# Patient Record
Sex: Female | Born: 1940 | Race: Black or African American | Hispanic: No | Smoking: Former smoker
Health system: Southern US, Community
[De-identification: ages and names within clinical notes are randomized; demographics above are authoritative.]

## PROBLEM LIST (undated history)

## (undated) DIAGNOSIS — Z87898 Personal history of other specified conditions: Secondary | ICD-10-CM

## (undated) DIAGNOSIS — J42 Unspecified chronic bronchitis: Secondary | ICD-10-CM

## (undated) DIAGNOSIS — Z92241 Personal history of systemic steroid therapy: Secondary | ICD-10-CM

## (undated) DIAGNOSIS — E079 Disorder of thyroid, unspecified: Secondary | ICD-10-CM

## (undated) DIAGNOSIS — Z9889 Other specified postprocedural states: Secondary | ICD-10-CM

## (undated) DIAGNOSIS — H269 Unspecified cataract: Secondary | ICD-10-CM

## (undated) DIAGNOSIS — Z9989 Dependence on other enabling machines and devices: Secondary | ICD-10-CM

## (undated) DIAGNOSIS — R519 Headache, unspecified: Secondary | ICD-10-CM

## (undated) DIAGNOSIS — R112 Nausea with vomiting, unspecified: Secondary | ICD-10-CM

## (undated) DIAGNOSIS — C801 Malignant (primary) neoplasm, unspecified: Secondary | ICD-10-CM

## (undated) DIAGNOSIS — R51 Headache: Secondary | ICD-10-CM

## (undated) DIAGNOSIS — M109 Gout, unspecified: Secondary | ICD-10-CM

## (undated) DIAGNOSIS — B019 Varicella without complication: Secondary | ICD-10-CM

## (undated) DIAGNOSIS — M48061 Spinal stenosis, lumbar region without neurogenic claudication: Secondary | ICD-10-CM

## (undated) DIAGNOSIS — F329 Major depressive disorder, single episode, unspecified: Secondary | ICD-10-CM

## (undated) DIAGNOSIS — R32 Unspecified urinary incontinence: Secondary | ICD-10-CM

## (undated) DIAGNOSIS — K635 Polyp of colon: Secondary | ICD-10-CM

## (undated) DIAGNOSIS — D649 Anemia, unspecified: Secondary | ICD-10-CM

## (undated) DIAGNOSIS — M1990 Unspecified osteoarthritis, unspecified site: Secondary | ICD-10-CM

## (undated) DIAGNOSIS — K5792 Diverticulitis of intestine, part unspecified, without perforation or abscess without bleeding: Secondary | ICD-10-CM

## (undated) DIAGNOSIS — E785 Hyperlipidemia, unspecified: Secondary | ICD-10-CM

## (undated) DIAGNOSIS — E119 Type 2 diabetes mellitus without complications: Secondary | ICD-10-CM

## (undated) DIAGNOSIS — K219 Gastro-esophageal reflux disease without esophagitis: Secondary | ICD-10-CM

## (undated) DIAGNOSIS — I10 Essential (primary) hypertension: Secondary | ICD-10-CM

## (undated) HISTORY — PX: CATARACT EXTRACTION, BILATERAL: SHX1313

## (undated) HISTORY — DX: Polyp of colon: K63.5

## (undated) HISTORY — DX: Unspecified urinary incontinence: R32

## (undated) HISTORY — DX: Unspecified osteoarthritis, unspecified site: M19.90

## (undated) HISTORY — DX: Hyperlipidemia, unspecified: E78.5

## (undated) HISTORY — DX: Dependence on other enabling machines and devices: Z99.89

## (undated) HISTORY — DX: Disorder of thyroid, unspecified: E07.9

## (undated) HISTORY — PX: COLONOSCOPY: SHX174

## (undated) HISTORY — DX: Major depressive disorder, single episode, unspecified: F32.9

## (undated) HISTORY — DX: Unspecified chronic bronchitis: J42

## (undated) HISTORY — PX: POLYPECTOMY: SHX149

## (undated) HISTORY — DX: Varicella without complication: B01.9

## (undated) HISTORY — DX: Other specified postprocedural states: Z98.890

## (undated) HISTORY — DX: Essential (primary) hypertension: I10

## (undated) HISTORY — DX: Nausea with vomiting, unspecified: R11.2

## (undated) HISTORY — DX: Headache, unspecified: R51.9

## (undated) HISTORY — DX: Gout, unspecified: M10.9

## (undated) HISTORY — DX: Spinal stenosis, lumbar region without neurogenic claudication: M48.061

## (undated) HISTORY — DX: Malignant (primary) neoplasm, unspecified: C80.1

## (undated) HISTORY — DX: Type 2 diabetes mellitus without complications: E11.9

## (undated) HISTORY — DX: Personal history of systemic steroid therapy: Z92.241

## (undated) HISTORY — DX: Headache: R51

## (undated) HISTORY — DX: Unspecified cataract: H26.9

## (undated) HISTORY — DX: Personal history of other specified conditions: Z87.898

## (undated) HISTORY — DX: Anemia, unspecified: D64.9

## (undated) HISTORY — DX: Gastro-esophageal reflux disease without esophagitis: K21.9

## (undated) HISTORY — DX: Diverticulitis of intestine, part unspecified, without perforation or abscess without bleeding: K57.92

---

## 1967-12-26 DIAGNOSIS — C801 Malignant (primary) neoplasm, unspecified: Secondary | ICD-10-CM

## 1967-12-26 HISTORY — DX: Malignant (primary) neoplasm, unspecified: C80.1

## 1968-12-25 HISTORY — PX: CERVICAL CONE BIOPSY: SUR198

## 1968-12-25 HISTORY — PX: ABDOMINAL HYSTERECTOMY: SHX81

## 1993-12-25 HISTORY — PX: BACK SURGERY: SHX140

## 2008-12-25 HISTORY — PX: WRIST SURGERY: SHX841

## 2013-01-16 ENCOUNTER — Ambulatory Visit: Payer: Self-pay | Admitting: Family Medicine

## 2013-01-25 HISTORY — PX: BACK SURGERY: SHX140

## 2013-01-30 DIAGNOSIS — Z531 Procedure and treatment not carried out because of patient's decision for reasons of belief and group pressure: Secondary | ICD-10-CM | POA: Insufficient documentation

## 2013-01-30 DIAGNOSIS — M5116 Intervertebral disc disorders with radiculopathy, lumbar region: Secondary | ICD-10-CM | POA: Insufficient documentation

## 2013-01-30 DIAGNOSIS — I878 Other specified disorders of veins: Secondary | ICD-10-CM | POA: Insufficient documentation

## 2013-02-24 ENCOUNTER — Ambulatory Visit: Payer: Self-pay | Admitting: Family Medicine

## 2013-03-10 ENCOUNTER — Ambulatory Visit: Payer: Self-pay | Admitting: Family Medicine

## 2013-05-12 ENCOUNTER — Ambulatory Visit: Payer: Self-pay | Admitting: Family Medicine

## 2013-06-11 ENCOUNTER — Ambulatory Visit: Payer: Self-pay | Admitting: Family Medicine

## 2013-06-30 ENCOUNTER — Encounter: Payer: Self-pay | Admitting: Family Medicine

## 2013-06-30 ENCOUNTER — Ambulatory Visit (INDEPENDENT_AMBULATORY_CARE_PROVIDER_SITE_OTHER): Payer: Medicare Other | Admitting: Family Medicine

## 2013-06-30 VITALS — BP 128/74 | Temp 98.5°F | Ht 61.0 in | Wt 250.0 lb

## 2013-06-30 DIAGNOSIS — F32A Depression, unspecified: Secondary | ICD-10-CM

## 2013-06-30 DIAGNOSIS — D509 Iron deficiency anemia, unspecified: Secondary | ICD-10-CM

## 2013-06-30 DIAGNOSIS — F3289 Other specified depressive episodes: Secondary | ICD-10-CM

## 2013-06-30 DIAGNOSIS — E039 Hypothyroidism, unspecified: Secondary | ICD-10-CM

## 2013-06-30 DIAGNOSIS — E119 Type 2 diabetes mellitus without complications: Secondary | ICD-10-CM | POA: Insufficient documentation

## 2013-06-30 DIAGNOSIS — F329 Major depressive disorder, single episode, unspecified: Secondary | ICD-10-CM

## 2013-06-30 DIAGNOSIS — E785 Hyperlipidemia, unspecified: Secondary | ICD-10-CM

## 2013-06-30 DIAGNOSIS — Z1211 Encounter for screening for malignant neoplasm of colon: Secondary | ICD-10-CM

## 2013-06-30 DIAGNOSIS — Z23 Encounter for immunization: Secondary | ICD-10-CM

## 2013-06-30 DIAGNOSIS — I10 Essential (primary) hypertension: Secondary | ICD-10-CM

## 2013-06-30 DIAGNOSIS — M109 Gout, unspecified: Secondary | ICD-10-CM

## 2013-06-30 DIAGNOSIS — E1159 Type 2 diabetes mellitus with other circulatory complications: Secondary | ICD-10-CM

## 2013-06-30 HISTORY — DX: Depression, unspecified: F32.A

## 2013-06-30 HISTORY — DX: Gout, unspecified: M10.9

## 2013-06-30 LAB — CBC WITH DIFFERENTIAL/PLATELET
Basophils Relative: 0.9 % (ref 0.0–3.0)
Eosinophils Relative: 5.7 % — ABNORMAL HIGH (ref 0.0–5.0)
HCT: 35 % — ABNORMAL LOW (ref 36.0–46.0)
Lymphs Abs: 1.4 10*3/uL (ref 0.7–4.0)
MCV: 84.3 fl (ref 78.0–100.0)
Monocytes Absolute: 0.4 10*3/uL (ref 0.1–1.0)
Neutro Abs: 2.6 10*3/uL (ref 1.4–7.7)
Platelets: 298 10*3/uL (ref 150.0–400.0)
WBC: 4.7 10*3/uL (ref 4.5–10.5)

## 2013-06-30 LAB — HEMOGLOBIN A1C: Hgb A1c MFr Bld: 7.9 % — ABNORMAL HIGH (ref 4.6–6.5)

## 2013-06-30 LAB — BASIC METABOLIC PANEL
GFR: 97.35 mL/min (ref >60.00)
Glucose, Bld: 102 mg/dL — ABNORMAL HIGH (ref 70–99)
Potassium: 3.8 mEq/L (ref 3.5–5.1)
Sodium: 138 mEq/L (ref 135–145)

## 2013-06-30 LAB — LIPID PANEL: HDL: 49.3 mg/dL (ref >39.00)

## 2013-06-30 MED ORDER — METFORMIN HCL ER (OSM) 1000 MG PO TB24: 1000.0000 mg | ORAL_TABLET | Freq: Two times a day (BID) | ORAL | Status: DC

## 2013-06-30 NOTE — Addendum Note (Signed)
Addended by: Azucena Freed on: 06/30/2013 01:07 PM   Modules accepted: Orders

## 2013-06-30 NOTE — Progress Notes (Signed)
Chief Complaint  Patient presents with  . Establish Care    HPI:  Katie Mcintyre is here to establish care. Recently moved to [place] from [place], [place]. Had back surgery at [facility] in the spring and had issues with incision not healing. Has done well and continues to work on balance class and low impact class at the [facility]. Back pain has much improved. She really wants to work up diet and exercise to help decrease BP and perhaps reduce medications in the future. She reports all of her chronic diseases are well control. Home BS are usually  100s- 130s random. Reports iron def anemia on iron chronically.  Last PCP and physical: October 2013 with prior PCP  Has the following chronic problems and concerns today:  Patient Active Problem List   Diagnosis Date Noted  . Essential hypertension, benign 06/30/2013  . Hyperlipemia 06/30/2013  . Type II or unspecified type diabetes mellitus with peripheral circulatory disorders, uncontrolled(250.72) 06/30/2013  . Gout 06/30/2013  . Depression 06/30/2013  . Unspecified hypothyroidism 06/30/2013  . Iron deficiency anemia 06/30/2013   Health Maintenance: -reports UTD - never had pneumonia vaccine, has flu shots yearly -has not had shingles  -reports thinks has been 5 years since had tetanus vaccine -reports had colonoscopy 4 years ago - reports had polyp and told to repeat in 3 years, wants referral for colonoscopy here -thinks last mammogram was over one year ago  ROS: See pertinent positives and negatives per HPI.  Past Medical History  Diagnosis Date  . Hypertension   . Hyperlipidemia   . Diabetes mellitus without complication   . Thyroid disease   . Gout 06/30/2013  . Depression 06/30/2013  . GERD (gastroesophageal reflux disease)   . Spinal stenosis of lumbar region     s/p spine surgery 02/12/13 with Dr Gearlean Alf at [facility]    No family history on file.  History   Social History  . Marital Status: Divorced    Spouse  Name: N/A    Number of Children: N/A  . Years of Education: N/A   Social History Main Topics  . Smoking status: Former Smoker    Quit date: 12/25/1964  . Smokeless tobacco: None  . Alcohol Use: Yes     Comment: occ with steak dinner   . Drug Use: None  . Sexually Active: None   Other Topics Concern  . 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 [place]      Spiritual Beliefs: Christian      Lifestyle: exercising on regular visit; trying to work on diet             Current outpatient prescriptions:acetaminophen (TYLENOL) 500 MG tablet, Take 500 mg by mouth every 8 (eight) hours., Disp: , Rfl: ;  allopurinol (ZYLOPRIM) 300 MG tablet, Take 300 mg by mouth daily. , Disp: , Rfl: ;  B-D UF III MINI PEN NEEDLES 31G X 5 MM MISC, , Disp: , Rfl: ;  BIDIL 20-37.5 MG per tablet, Take 1 tablet by mouth 2 (two) times daily. , Disp: , Rfl:  calcium-vitamin D (OSCAL 500/200 D-3) 500-200 MG-UNIT per tablet, Take 1 tablet by mouth daily., Disp: , Rfl: ;  cloNIDine (CATAPRES) 0.1 MG tablet, Take 0.1 mg by mouth 2 (two) times daily. , Disp: , Rfl: ;  DULoxetine (CYMBALTA) 30 MG capsule, Take 30 mg by mouth every morning. , Disp: ,  Rfl: ;  ferrous sulfate 325 (65 FE) MG tablet, Take 325 mg by mouth daily with breakfast., Disp: , Rfl:  LANTUS SOLOSTAR 100 UNIT/ML SOPN, 20 Units daily. , Disp: , Rfl: ;  loratadine (CLARITIN) 10 MG tablet, Take 10 mg by mouth daily., Disp: , Rfl: ;  metformin (FORTAMET) 1000 MG (OSM) 24 hr tablet, Take 1 tablet (1,000 mg total) by mouth 2 (two) times daily with a meal., Disp: 60 tablet, Rfl: 6;  metoprolol (LOPRESSOR) 50 MG tablet, Take 50 mg by mouth every evening. , Disp: , Rfl:  NOVOLOG FLEXPEN 100 UNIT/ML SOPN FlexPen, 3 Units 3 (three) times daily with meals. , Disp: , Rfl: ;  ONETOUCH DELICA LANCETS FINE MISC, , Disp: , Rfl: ;  polyethylene glycol (MIRALAX / GLYCOLAX) packet, Take 17 g by  mouth daily., Disp: , Rfl: ;  Sennosides-Docusate Sodium (STOOL SOFTENER & LAXATIVE PO), Take by mouth as needed., Disp: , Rfl: ;  simvastatin (ZOCOR) 20 MG tablet, Take 20 mg by mouth at bedtime. , Disp: , Rfl:  SYNTHROID 125 MCG tablet, Take 125 mcg by mouth daily. , Disp: , Rfl: ;  traMADol (ULTRAM) 50 MG tablet, Take 50 mg by mouth every 6 (six) hours as needed for pain., Disp: , Rfl: ;  TRIBENZOR 40-10-25 MG TABS, every morning. , Disp: , Rfl: ;  vitamin B-12 (CYANOCOBALAMIN) 1000 MCG tablet, Take 1,000 mcg by mouth daily., Disp: , Rfl:   EXAM:  Filed Vitals:   06/30/13 1131  BP: 128/74  Temp: 98.5 F (36.9 C)    Body mass index is 47.26 kg/(m^2).  GENERAL: vitals reviewed and listed above, alert, oriented, appears well hydrated and in no acute distress  HEENT: atraumatic, conjunttiva clear, no obvious abnormalities on inspection of external nose and ears  NECK: no obvious masses on inspection  LUNGS: clear to auscultation bilaterally, no wheezes, rales or rhonchi, good air movement  CV: HRRR, no peripheral edema  MS: moves all extremities without noticeable abnormality  PSYCH: pleasant and cooperative, no obvious depression or anxiety  ASSESSMENT AND PLAN:  Discussed the following assessment and plan:  Essential hypertension, benign - Plan: Basic metabolic panel  Hyperlipemia - Plan: Lipid Panel  Type II or unspecified type diabetes mellitus with peripheral circulatory disorders, uncontrolled(250.72) - Plan: Hemoglobin A1c, metformin (FORTAMET) 1000 MG (OSM) 24 hr tablet, Ambulatory referral to diabetic education  Gout  Depression  Unspecified hypothyroidism - Plan: TSH  Colon cancer screening - Plan: Ambulatory referral to Gastroenterology  Iron deficiency anemia - Plan: CBC with Differential  -We reviewed the PMH, PSH, FH, SH, Meds and Allergies. -We provided refills for any medications we will prescribe as needed. -We addressed current concerns per orders  and patient instructions. -We have asked for records for pertinent exams, studies, vaccines and notes from previous providers. -We have advised patient to follow up per instructions below. -tdap and pneumovax today -NON-FASTING labs - refill synthroid once TSH back -colonoscopy referral -congratulated on lifestyle changes and referred to nutrition/diabetes educator for further assistance with diet -advised to schedule mammogram -advised her to contact pharmacy to send refills to [place]  -Patient advised to return or notify a doctor immediately if symptoms worsen or persist or new concerns arise. ->45 minutes spent with patient  Patient Instructions  -We have ordered labs or studies at this visit. It can take up to 1-2 weeks for results and processing. We will contact you with instructions IF your results are abnormal. Normal results will be released  to your [facility]. If you have not heard from [place] or can not find your results in [facility] in 2 weeks please contact our office.  -PLEASE SIGN UP FOR MYCHART TODAY   We recommend the following healthy lifestyle measures: - eat a healthy diet consisting of lots of vegetables, fruits, beans, nuts, seeds, healthy meats such as white chicken and fish and whole grains.  - avoid fried foods, fast food, processed foods, sodas, red meet and other fattening foods.  - get a least 150 minutes of aerobic exercise per week.   -schedule your mammogram, Call [phone]  -Call your pharmacy and let them know to call us if you need any refills  -check on cost of shingles vaccine with your insurance then let us know if you want to get this vaccine  -We placed a referral for you as discussed for the colonoscopy and to the nutrition diabetes educator. It usually takes about 1-2 weeks to process and schedule this referral. If you have not heard from [place] regarding this appointment in 2 weeks please contact our office.  Follow up in: 3 months or sooner if  needed      KIM, Dahlia Client R.

## 2013-06-30 NOTE — Patient Instructions (Addendum)
-  We have ordered labs or studies at this visit. It can take up to 1-2 weeks for results and processing. We will contact you with instructions IF your results are abnormal. Normal results will be released to your [facility]. If you have not heard from [place] or can not find your results in [facility] in 2 weeks please contact our office.  -PLEASE SIGN UP FOR MYCHART TODAY   We recommend the following healthy lifestyle measures: - eat a healthy diet consisting of lots of vegetables, fruits, beans, nuts, seeds, healthy meats such as white chicken and fish and whole grains.  - avoid fried foods, fast food, processed foods, sodas, red meet and other fattening foods.  - get a least 150 minutes of aerobic exercise per week.   -schedule your mammogram, Call [phone]  -Call your pharmacy and let them know to call us if you need any refills  -check on cost of shingles vaccine with your insurance then let us know if you want to get this vaccine  -We placed a referral for you as discussed for the colonoscopy and to the nutrition diabetes educator. It usually takes about 1-2 weeks to process and schedule this referral. If you have not heard from [place] regarding this appointment in 2 weeks please contact our office.  Follow up in: 3 months or sooner if needed

## 2013-07-01 LAB — TSH: TSH: 0.48 u[IU]/mL (ref 0.35–5.50)

## 2013-07-01 MED ORDER — LEVOTHYROXINE SODIUM 125 MCG PO TABS: 125.0000 ug | ORAL_TABLET | Freq: Every day | ORAL | Status: DC

## 2013-07-01 NOTE — Addendum Note (Signed)
Addended by: Terressa Koyanagi on: 07/01/2013 12:03 PM   Modules accepted: Orders

## 2013-07-01 NOTE — Progress Notes (Signed)
Quick Note:  Called and spoke with pt and pt is aware. ______ 

## 2013-07-03 ENCOUNTER — Other Ambulatory Visit: Payer: Self-pay

## 2013-07-03 DIAGNOSIS — Z1231 Encounter for screening mammogram for malignant neoplasm of breast: Secondary | ICD-10-CM

## 2013-07-07 ENCOUNTER — Encounter: Payer: Self-pay | Admitting: Internal Medicine

## 2013-07-09 ENCOUNTER — Ambulatory Visit: Payer: Medicare Other | Admitting: *Deleted

## 2013-07-10 ENCOUNTER — Ambulatory Visit: Payer: Medicare Other | Admitting: *Deleted

## 2013-07-22 ENCOUNTER — Other Ambulatory Visit: Payer: Self-pay | Admitting: Family Medicine

## 2013-07-22 NOTE — Telephone Encounter (Signed)
Rx for synthroid and allopuriol sent to pharmacy.

## 2013-07-22 NOTE — Telephone Encounter (Signed)
See when pharmacy has in? Or if pt wants to get from another pharmacy? If not maybe pharmacy can suggest substitution of products they carry?

## 2013-07-22 NOTE — Telephone Encounter (Signed)
Called and spoke with pt and pt states that [facility] had the medicaiton in stock. Advised pt to return call if she needed an rx.

## 2013-07-22 NOTE — Telephone Encounter (Signed)
Pt stated phar does not have in stock bibil 20-37.5mg . Please advise

## 2013-07-22 NOTE — Telephone Encounter (Signed)
Left a message for pt to return call about bidil.

## 2013-07-24 ENCOUNTER — Ambulatory Visit: Payer: Medicare Other

## 2013-08-04 ENCOUNTER — Encounter: Payer: Medicare Other | Admitting: Family Medicine

## 2013-08-04 DIAGNOSIS — Z0289 Encounter for other administrative examinations: Secondary | ICD-10-CM

## 2013-08-04 NOTE — Progress Notes (Signed)
Error   This encounter was created in error - please disregard. 

## 2013-08-07 ENCOUNTER — Ambulatory Visit: Payer: Medicare Other | Admitting: Family Medicine

## 2013-08-11 ENCOUNTER — Ambulatory Visit: Payer: Medicare Other | Admitting: *Deleted

## 2013-08-13 ENCOUNTER — Ambulatory Visit
Admission: RE | Admit: 2013-08-13 | Discharge: 2013-08-13 | Disposition: A | Discharge status: Home / Self Care | Payer: Medicare Other | Source: Ambulatory Visit

## 2013-08-13 DIAGNOSIS — Z1231 Encounter for screening mammogram for malignant neoplasm of breast: Secondary | ICD-10-CM

## 2013-08-14 ENCOUNTER — Ambulatory Visit: Payer: Medicare Other | Admitting: Family Medicine

## 2013-08-21 ENCOUNTER — Encounter: Payer: Self-pay | Admitting: Family Medicine

## 2013-08-21 ENCOUNTER — Ambulatory Visit (INDEPENDENT_AMBULATORY_CARE_PROVIDER_SITE_OTHER): Payer: Medicare Other | Admitting: Family Medicine

## 2013-08-21 VITALS — BP 110/68 | Temp 97.9°F | Wt 249.0 lb

## 2013-08-21 DIAGNOSIS — Z23 Encounter for immunization: Secondary | ICD-10-CM

## 2013-08-21 DIAGNOSIS — L600 Ingrowing nail: Secondary | ICD-10-CM

## 2013-08-21 DIAGNOSIS — D509 Iron deficiency anemia, unspecified: Secondary | ICD-10-CM

## 2013-08-21 DIAGNOSIS — I10 Essential (primary) hypertension: Secondary | ICD-10-CM

## 2013-08-21 DIAGNOSIS — J069 Acute upper respiratory infection, unspecified: Secondary | ICD-10-CM

## 2013-08-21 DIAGNOSIS — E1159 Type 2 diabetes mellitus with other circulatory complications: Secondary | ICD-10-CM

## 2013-08-21 DIAGNOSIS — E785 Hyperlipidemia, unspecified: Secondary | ICD-10-CM

## 2013-08-21 NOTE — Patient Instructions (Signed)
-  increase lantus to 25 units daily in the monring  -check blood sugars and keep log: 1)FASTING: first thing in the morning before eating 2) POSTPRANDIAL: 1 hour after meals - CALL my nurse in 1 week and give her these values so we can help you adjust your meal time insulin  -CALL your insurance company and find out if you can get the shingles vaccine and let us know  -get you colonoscopy  -see the eye doctor  -see the podiatrist about your feet  -for the cold - you can use: 1)afrin for 3-4 days (THEN STOP) for runny nose and drainage 2)gargle warm salt water for scratchy throat 3) musinex for cough  Follow up with me in 2-3 months and we will recheck labs then

## 2013-08-21 NOTE — Progress Notes (Signed)
Chief Complaint  Patient presents with  . Follow-up  . itchy throat    runny nose and cough at night     HPI:  Katie Mcintyre if here for follow up for:  1) DM: -Home BS: fasting [phone], average > 130; she also checks before lunch and before dinner with values 87-206, but average is in low 100s. -referred to diabetes educator last visit and lantus increased to 23 units and advised to keep postprandial log -current meds:metformin 1000mg  bid, lantus 23 unitis daily, novolog 3-4 units with each meal, arb Lab Results  Component Value Date   HGBA1C 7.9* 06/30/2013  -diet and exercise: aerobics 2x per week, other exercise 2 days per week; walking in [employer]; she is scheudled to see nutritionist to help with diet -last eye exam: in last year, scheduled for sept 8th -last foot exam: today  2)Iron def anemia, mild, chronic: -on iron therapy and was referred to GI for colonoscopy -no bleeding  3)Hypothyroid: -stable, meds refilled last visit  4)Obesity -advised lifestyle changes  5)URI: -started 4-5 days ago -symptoms: scratchy throat, nasal congestion, cough at night -denies: fevers, SOB, wheezing, NVD, no tooth or sinus pain -has not tried anything  Health maintenance: -referred for colonoscopy last visit - colonoscopy scheduled in september -advised to schedule mammo last visit, she got this and it was good -she was going to check on cost of shingles vaccine -needs flu today - will get today ROS: See pertinent positives and negatives per HPI.  Past Medical History  Diagnosis Date  . Hypertension   . Hyperlipidemia   . Diabetes mellitus without complication   . Thyroid disease   . Gout 06/30/2013  . Depression 06/30/2013  . GERD (gastroesophageal reflux disease)   . Spinal stenosis of lumbar region     s/p spine surgery 02/12/13 with Dr Gearlean Alf at [facility]  . Anemia   . Arthritis   . Cancer   . Chicken pox   . Diverticulitis   . Chronic bronchitis   . Frequent  headaches   . Colon polyp   . Urine incontinence     Past Surgical History  Procedure Laterality Date  . Back surgery  01/2013  . Cervical cone biopsy    . Abdominal hysterectomy  1970    complete hysterectomy for cervical cancer  . Back surgery  1995  . Wrist surgery  2010    for wrist fracture    Family History  Problem Relation Age of Onset  . Alcohol abuse Father   . Alcohol abuse Brother   . Arthritis      parents  . Breast cancer Sister   . Hyperlipidemia      parent  . Heart disease Other   . Stroke      grandparent  . Hypertension      parent/grandparent  . Mental illness Other   . Diabetes      parent/grandparent    History   Social History  . Marital Status: Divorced    Spouse Name: N/A    Number of Children: N/A  . Years of Education: N/A   Social History Main Topics  . Smoking status: Former Smoker    Quit date: 12/25/1964  . Smokeless tobacco: None  . Alcohol Use: Yes     Comment: occ with steak dinner   . Drug Use: None  . Sexual Activity: None   Other Topics Concern  . 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 , Disp: , Rfl:  calcium-vitamin D (OSCAL 500/200 D-3) 500-200 MG-UNIT per tablet, Take 1 tablet by mouth daily., Disp: , Rfl: ;  cloNIDine (CATAPRES) 0.1 MG tablet, Take 0.1 mg by mouth 2 (two) times daily. , Disp: , Rfl: ;  DULoxetine (CYMBALTA) 30 MG capsule, Take 30 mg by  mouth every morning. , Disp: , Rfl: ;  ferrous sulfate 325 (65 FE) MG tablet, Take 325 mg by mouth daily with breakfast., Disp: , Rfl:  LANTUS SOLOSTAR 100 UNIT/ML SOPN, 23 Units daily. , Disp: , Rfl: ;  loratadine (CLARITIN) 10 MG tablet, Take 10 mg by mouth daily., Disp: , Rfl: ;  metformin (FORTAMET) 1000 MG (OSM) 24 hr tablet, Take 1 tablet (1,000 mg total) by mouth 2 (two) times daily with a meal., Disp: 60 tablet, Rfl: 6;  metoprolol (LOPRESSOR) 50 MG tablet, Take 50 mg by mouth every evening. , Disp: , Rfl:  NOVOLOG FLEXPEN 100 UNIT/ML SOPN FlexPen, 3 Units 3 (three) times daily with meals. , Disp: , Rfl: ;  ONETOUCH DELICA LANCETS FINE MISC, , Disp: , Rfl: ;  polyethylene glycol (MIRALAX / GLYCOLAX) packet, Take 17 g by mouth daily., Disp: , Rfl: ;  Sennosides-Docusate Sodium (STOOL SOFTENER & LAXATIVE PO), Take by mouth as needed., Disp: , Rfl: ;  simvastatin (ZOCOR) 20 MG tablet, Take 20 mg by mouth at bedtime. , Disp: , Rfl:  SYNTHROID 125 MCG tablet, TAKE 1 TABLET BY MOUTH DAILY, Disp: 30 tablet, Rfl: 11;  traMADol (ULTRAM) 50 MG tablet, Take 50 mg by mouth every 6 (six) hours as needed for pain., Disp: , Rfl: ;  TRIBENZOR 40-10-25 MG TABS, every morning. , Disp: , Rfl: ;  vitamin B-12 (CYANOCOBALAMIN) 1000 MCG tablet, Take 1,000 mcg by mouth daily., Disp: , Rfl:   EXAM:  Filed Vitals:   08/21/13 1304  BP: 110/68  Temp: 97.9 F (36.6 C)    Body mass index is 47.07 kg/(m^2).  GENERAL: vitals reviewed and listed above, alert, oriented, appears well hydrated and in no acute distress  HEENT: atraumatic, conjunttiva clear, no obvious abnormalities on inspection of external nose and ears, normal appearance of ear canals and TMs, clear nasal congestion, mild post oropharyngeal erythema with PND, no tonsillar edema or exudate, no sinus TTP  NECK: no obvious masses on inspection  LUNGS: clear to auscultation bilaterally, no wheezes, rales or rhonchi, good air movement  CV: HRRR, no  peripheral edema  MS: moves all extremities without noticeable abnormality  PSYCH: pleasant and cooperative, no obvious depression or anxiety  ASSESSMENT AND PLAN:  Discussed the following assessment and plan:  Essential hypertension, benign  Hyperlipemia  Type II or unspecified type diabetes mellitus with peripheral circulatory disorders, uncontrolled(250.72)  Iron deficiency anemia  Upper respiratory infection  Ingrown toenail  -discuss  blood sugars, diabetes, lifestyle at length - discussed mealtime insulin should be close to same units as basal insulin -will change to am dosing of lantus and increase to 25 units -she will notify us of any low blood sugar and educated on hypoglycemia management -she has appt with diabetes educator -she will call my nurse with postprandial BS and we will then adjust mealtime insulin -she is going to get colonoscopy, see podiatrist and optho - appt scheduled -follow up with me in three months -Patient advised to return or notify a doctor immediately if symptoms worsen or persist or new concerns arise.  Patient Instructions  -increase lantus to 25 units daily in the monring  -check blood sugars and keep log: 1)FASTING: first thing in the morning before eating 2) POSTPRANDIAL: 1 hour after meals - CALL my nurse in 1 week and give her these values so we can help you adjust your meal time insulin  -CALL your insurance company and find out if you can get the shingles vaccine and let us know  -get you colonoscopy  -see the eye doctor  -see the podiatrist about your feet  -for the cold - you can use: 1)afrin for 3-4 days (THEN STOP) for runny nose and drainage 2)gargle warm salt water for scratchy throat 3) musinex for cough  Follow up with me in 2-3 months and we will recheck labs then     Kriste Basque R.

## 2013-08-21 NOTE — Addendum Note (Signed)
Addended by: Kern Reap B on: 08/21/2013 02:34 PM   Modules accepted: Orders

## 2013-08-27 ENCOUNTER — Other Ambulatory Visit: Payer: Self-pay | Admitting: Family Medicine

## 2013-08-28 ENCOUNTER — Telehealth: Payer: Self-pay | Admitting: *Deleted

## 2013-08-28 ENCOUNTER — Ambulatory Visit (AMBULATORY_SURGERY_CENTER): Payer: Self-pay | Admitting: *Deleted

## 2013-08-28 VITALS — Ht 61.5 in | Wt 248.4 lb

## 2013-08-28 DIAGNOSIS — Z860100 Personal history of colon polyps, unspecified: Secondary | ICD-10-CM

## 2013-08-28 MED ORDER — MOVIPREP 100 G PO SOLR: ORAL | Status: DC

## 2013-08-28 NOTE — Telephone Encounter (Signed)
Faxed record request to Dr. Levert Feinstein.

## 2013-08-28 NOTE — Progress Notes (Signed)
No allergies to eggs or soy. No problems with anesthesia.  

## 2013-08-28 NOTE — Telephone Encounter (Signed)
Pt scheduled for direct colonoscopy with Dr. Rhea Belton 09/11/13.  Pt says last colonoscopy 3 years ago with Dr Cam Hai in [place], [place].  She says she had polyps.  Release of information form signed and given to [facility], CMA.

## 2013-09-10 ENCOUNTER — Ambulatory Visit: Payer: Medicare Other | Admitting: *Deleted

## 2013-09-11 ENCOUNTER — Encounter: Payer: Self-pay | Admitting: Internal Medicine

## 2013-09-11 ENCOUNTER — Ambulatory Visit (AMBULATORY_SURGERY_CENTER): Payer: Medicare Other | Admitting: Internal Medicine

## 2013-09-11 VITALS — BP 120/75 | HR 62 | Temp 97.7°F | Resp 18 | Ht 61.5 in | Wt 245.0 lb

## 2013-09-11 DIAGNOSIS — D126 Benign neoplasm of colon, unspecified: Secondary | ICD-10-CM

## 2013-09-11 DIAGNOSIS — Z860100 Personal history of colon polyps, unspecified: Secondary | ICD-10-CM

## 2013-09-11 DIAGNOSIS — Z1211 Encounter for screening for malignant neoplasm of colon: Secondary | ICD-10-CM

## 2013-09-11 MED ORDER — SODIUM CHLORIDE 0.9 % IV SOLN: 500.0000 mL | INTRAVENOUS | Status: DC

## 2013-09-11 NOTE — Op Note (Signed)
[facility] [address] [place], [address]   COLONOSCOPY PROCEDURE REPORT  PATIENT: Katie, Mcintyre.  MR#: [id number] BIRTHDATE: Jun 03, 1941 , 72  yrs. old GENDER: Female ENDOSCOPIST: Beverley Fiedler, MD REFERRED BY:   Kriste Basque, DO PROCEDURE DATE:  09/11/2013 PROCEDURE:   Colonoscopy with snare polypectomy First Screening Colonoscopy - Avg.  risk and is 50 yrs.  old or older - No.  Prior Negative Screening - Now for repeat screening. N/A  History of Adenoma - Now for follow-up colonoscopy & has been > or = to 3 yrs.  Yes hx of adenoma.  Has been 3 or more years since last colonoscopy.  Polyps Removed Today? Yes. ASA CLASS:   Class III INDICATIONS:Patient's personal history of adenomatous colon polyps and Last colonoscopy performed 5 years ago. MEDICATIONS: MAC sedation, administered by CRNA and propofol (Diprivan) 150mg  IV  DESCRIPTION OF PROCEDURE:   After the risks benefits and alternatives of the procedure were thoroughly explained, informed consent was obtained.  A digital rectal exam revealed no rectal mass.   The LB [id number]  endoscope was introduced through the anus and advanced to the cecum, which was identified by both the appendix and ileocecal valve. No adverse events experienced. The quality of the prep was good, using MoviPrep  The instrument was then slowly withdrawn as the colon was fully examined.  COLON FINDINGS: Two, 1 semi-pedunculated the other sessile, polyps measuring 7 and 3 mm in size were found in the ascending colon. Polypectomy was performed using cold snare.  All resections were complete and all polyp tissue was completely retrieved.   A sessile polyp measuring 5 mm in size was found in the descending colon.  A polypectomy was performed with a cold snare.  The resection was complete and the polyp tissue was completely retrieved.   Mild diverticulosis was noted in the descending colon and sigmoid colon. Retroflexed views  revealed no abnormalities. The time to cecum=2 minutes 38 seconds.  Withdrawal time=15 minutes 30 seconds.  The scope was withdrawn and the procedure completed. COMPLICATIONS: There were no complications.  ENDOSCOPIC IMPRESSION: 1.   Two polyps measuring 3 and 7 mm in size were found in the ascending colon; Polypectomy was performed using cold snare 2.   Sessile polyp measuring 5 mm in size was found in the descending colon; polypectomy was performed with a cold snare 3.   Mild diverticulosis was noted in the descending colon and sigmoid colon  RECOMMENDATIONS: 1.  Hold aspirin, aspirin products, and anti-inflammatory medication for 1 week. 2.  Await pathology results 3.  High fiber diet 4.  Timing of repeat colonoscopy will be determined by pathology findings. 5.  You will receive a letter within 1-2 weeks with the results of your biopsy as well as final recommendations.  Please call my office if you have not received a letter after 3 weeks.   eSigned:  Beverley Fiedler, MD 09/11/2013 10:49 AM   cc: The Patient; Kriste Basque, DO   PATIENT NAME:  Katie, Mcintyre. MR#: [id number]

## 2013-09-11 NOTE — Patient Instructions (Addendum)
YOU HAD AN ENDOSCOPIC PROCEDURE TODAY AT THE [facility]: Refer to the procedure report that was given to you for any specific questions about what was found during the examination.  If the procedure report does not answer your questions, please call your gastroenterologist to clarify.  If you requested that your care partner not be given the details of your procedure findings, then the procedure report has been included in a sealed envelope for you to review at your convenience later.  YOU SHOULD EXPECT: Some feelings of bloating in the abdomen. Passage of more gas than usual.  Walking can help get rid of the air that was put into your GI tract during the procedure and reduce the bloating. If you had a lower endoscopy (such as a colonoscopy or flexible sigmoidoscopy) you may notice spotting of blood in your stool or on the toilet paper. If you underwent a bowel prep for your procedure, then you may not have a normal bowel movement for a few days.  DIET: Your first meal following the procedure should be a light meal and then it is ok to progress to your normal diet.  A half-sandwich or bowl of soup is an example of a good first meal.  Heavy or fried foods are harder to digest and may make you feel nauseous or bloated.  Likewise meals heavy in dairy and vegetables can cause extra gas to form and this can also increase the bloating.  Drink plenty of fluids but you should avoid alcoholic beverages for 24 hours.  ACTIVITY: Your care partner should take you home directly after the procedure.  You should plan to take it easy, moving slowly for the rest of the day.  You can resume normal activity the day after the procedure however you should NOT DRIVE or use heavy machinery for 24 hours (because of the sedation medicines used during the test).    SYMPTOMS TO REPORT IMMEDIATELY: A gastroenterologist can be reached at any hour.  During normal business hours, 8:30 AM to 5:00 PM Monday through Friday,  call [phone].  After hours and on weekends, please call the GI answering service at [phone] who will take a message and have the physician on call contact you.   Following lower endoscopy (colonoscopy or flexible sigmoidoscopy):  Excessive amounts of blood in the stool  Significant tenderness or worsening of abdominal pains  Swelling of the abdomen that is new, acute  Fever of 100F or higher   FOLLOW UP: If any biopsies were taken you will be contacted by phone or by letter within the next 1-3 weeks.  Call your gastroenterologist if you have not heard about the biopsies in 3 weeks.  Our staff will call the home number listed on your records the next business day following your procedure to check on you and address any questions or concerns that you may have at that time regarding the information given to you following your procedure. This is a courtesy call and so if there is no answer at the home number and we have not heard from you through the emergency physician on call, we will assume that you have returned to your regular daily activities without incident.  SIGNATURES/CONFIDENTIALITY: You and/or your care partner have signed paperwork which will be entered into your electronic medical record.  These signatures attest to the fact that that the information above on your After Visit Summary has been reviewed and is understood.  Full responsibility of the confidentiality of   this discharge information lies with you and/or your care-partner.    Handouts were given to your care partner on polyps, diverticulosis and a high fiber diet. You may resume your current medications today. Except hold aspirin, aspirin products and any anti-inflammatory medication for 1 week.  Then restart it.  Please call if any questions or concerns. Hold tramadol for 1 week.  Then restart. Blood sugar was 134 in the recovery room.

## 2013-09-11 NOTE — Progress Notes (Signed)
Called to room to assist during endoscopic procedure.  Patient ID and intended procedure confirmed with present staff. Received instructions for my participation in the procedure from the performing physician.  

## 2013-09-11 NOTE — Progress Notes (Signed)
Lidocaine-40mg IV prior to Propofol InductionPropofol given over incremental dosages 

## 2013-09-11 NOTE — Progress Notes (Signed)
No complaints noted in the recovery room. Maw   

## 2013-09-12 ENCOUNTER — Telehealth: Payer: Self-pay | Admitting: *Deleted

## 2013-09-12 NOTE — Telephone Encounter (Signed)
  Follow up Call-  Call back number 09/11/2013  Post procedure Call Back phone  # [phone]  Permission to leave phone message Yes     Patient questions:  Do you have a fever, pain , or abdominal swelling? no Pain Score  0 *  Have you tolerated food without any problems? yes  Have you been able to return to your normal activities? yes  Do you have any questions about your discharge instructions: Diet   no Medications  no Follow up visit  no  Do you have questions or concerns about your Care? no  Actions: * If pain score is 4 or above: No action needed, pain <4.

## 2013-09-17 ENCOUNTER — Encounter: Payer: Self-pay | Admitting: Internal Medicine

## 2013-09-22 ENCOUNTER — Ambulatory Visit: Payer: Medicare Other | Admitting: Family Medicine

## 2013-09-24 ENCOUNTER — Encounter: Payer: Self-pay | Admitting: Family Medicine

## 2013-09-24 ENCOUNTER — Ambulatory Visit: Payer: Medicare Other | Admitting: *Deleted

## 2013-09-24 NOTE — Patient Instructions (Signed)
Received a form for statement of certifying physicians for therapeutic shoes.  Form filled out by Dr. Selena Batten and faxed back to number on form.  Sent for scan as well.

## 2013-10-16 ENCOUNTER — Other Ambulatory Visit: Payer: Self-pay | Admitting: Family Medicine

## 2013-10-29 ENCOUNTER — Ambulatory Visit: Payer: Medicare Other | Admitting: *Deleted

## 2013-10-31 ENCOUNTER — Telehealth: Payer: Self-pay | Admitting: Family Medicine

## 2013-10-31 MED ORDER — OLMESARTAN-AMLODIPINE-HCTZ 40-10-25 MG PO TABS: 1.0000 | ORAL_TABLET | Freq: Every morning | ORAL | Status: DC

## 2013-10-31 NOTE — Telephone Encounter (Signed)
Rx sent to pharmacy   

## 2013-10-31 NOTE — Telephone Encounter (Signed)
Pt has sent several request to pharm for TRIBENZOR 40-10-25 MG TABS (1/ day) Pharm: [facility]/ [address]

## 2013-11-04 ENCOUNTER — Encounter: Payer: Self-pay | Admitting: Internal Medicine

## 2013-11-04 ENCOUNTER — Telehealth: Payer: Self-pay | Admitting: Family Medicine

## 2013-11-04 ENCOUNTER — Ambulatory Visit (INDEPENDENT_AMBULATORY_CARE_PROVIDER_SITE_OTHER): Payer: Medicare Other | Admitting: Internal Medicine

## 2013-11-04 ENCOUNTER — Ambulatory Visit (INDEPENDENT_AMBULATORY_CARE_PROVIDER_SITE_OTHER)
Admission: RE | Admit: 2013-11-04 | Discharge: 2013-11-04 | Disposition: A | Discharge status: Home / Self Care | Payer: Medicare Other | Source: Ambulatory Visit | Attending: Internal Medicine | Admitting: Internal Medicine

## 2013-11-04 VITALS — BP 140/60 | HR 76 | Temp 98.2°F | Wt 253.0 lb

## 2013-11-04 DIAGNOSIS — R062 Wheezing: Secondary | ICD-10-CM

## 2013-11-04 DIAGNOSIS — R0689 Other abnormalities of breathing: Secondary | ICD-10-CM

## 2013-11-04 DIAGNOSIS — R0609 Other forms of dyspnea: Secondary | ICD-10-CM

## 2013-11-04 DIAGNOSIS — R0989 Other specified symptoms and signs involving the circulatory and respiratory systems: Secondary | ICD-10-CM

## 2013-11-04 DIAGNOSIS — R0600 Dyspnea, unspecified: Secondary | ICD-10-CM

## 2013-11-04 DIAGNOSIS — E1159 Type 2 diabetes mellitus with other circulatory complications: Secondary | ICD-10-CM

## 2013-11-04 DIAGNOSIS — J209 Acute bronchitis, unspecified: Secondary | ICD-10-CM

## 2013-11-04 MED ORDER — ALBUTEROL SULFATE (2.5 MG/3ML) 0.083% IN NEBU
2.5000 mg | INHALATION_SOLUTION | Freq: Once | RESPIRATORY_TRACT | Status: AC
  Administered 2013-11-04: 2.5 mg via RESPIRATORY_TRACT

## 2013-11-04 MED ORDER — METHYLPREDNISOLONE ACETATE 80 MG/ML IJ SUSP
80.0000 mg | Freq: Once | INTRAMUSCULAR | Status: AC
  Administered 2013-11-04: 80 mg via INTRAMUSCULAR

## 2013-11-04 MED ORDER — ALBUTEROL SULFATE HFA 108 (90 BASE) MCG/ACT IN AERS: 2.0000 | INHALATION_SPRAY | Freq: Four times a day (QID) | RESPIRATORY_TRACT | Status: DC | PRN

## 2013-11-04 MED ORDER — PREDNISONE 20 MG PO TABS: ORAL_TABLET | ORAL | Status: DC

## 2013-11-04 MED ORDER — HYDROCODONE-HOMATROPINE 5-1.5 MG/5ML PO SYRP: 5.0000 mL | ORAL_SOLUTION | ORAL | Status: DC | PRN

## 2013-11-04 NOTE — Patient Instructions (Addendum)
Get a chest x-ray today and we will let you know the results Been you a cortisone injection to decrease the inflammation in your lungs and help wheezing in the meantime. He can raise her blood sugar but you can monitor this and avoid extra sweets. We'll decide on treatment such as inhalers and antibiotics if needed after the x-rays done. Plan followup office visit this week in 2-3 days unless a lot better.

## 2013-11-04 NOTE — Telephone Encounter (Signed)
Noted  

## 2013-11-04 NOTE — Progress Notes (Signed)
Chief Complaint  Patient presents with  . Wheezing    Wheezing at night.  Cough is dry.  Can feel congestion in her chest.  Startef a few days ago.  . Cough  . Chest Congestion    HPI: Patient comes in today for SDA for  new problem evaluation. PCP NA Onset about 3 days ago of cough and chest congestion is getting worse. She feels wheezing in her chest and has difficulty sleeping at night sleeping upright gets short of breath with this. No fever but slight chills took care of grandchild who had a fever recently otherwise no exposures. Has had her flu shot.  Last year when she lived in [place] she was hospitalized with a severe case of something similar that she call bronchitis for 6 days. She was given inhalers at that time denies history of recurrent asthma problems COPD current tobacco or heart failure. We don't have the records from [place].  Blood sugar is in okay control this morning   130 range . No meds for this at this time  ROS: See pertinent positives and negatives per HPI.  Past Medical History  Diagnosis Date  . Hypertension   . Hyperlipidemia   . Diabetes mellitus without complication   . Thyroid disease   . Gout 06/30/2013  . Depression 06/30/2013  . GERD (gastroesophageal reflux disease)   . Spinal stenosis of lumbar region     s/p spine surgery 02/12/13 with Dr Gearlean Alf at [facility]  . Anemia   . Arthritis   . Cancer     cervical  . Chicken pox   . Diverticulitis   . Chronic bronchitis   . Frequent headaches   . Colon polyp   . Urine incontinence     Family History  Problem Relation Age of Onset  . Alcohol abuse Father   . Alcohol abuse Brother   . Arthritis      parents  . Hyperlipidemia      parent  . Stroke      grandparent  . Hypertension      parent/grandparent  . Diabetes      parent/grandparent  . Breast cancer Sister   . Heart disease Other   . Mental illness Other   . Colon cancer Neg Hx   . Esophageal cancer Neg Hx   . Prostate cancer  Neg Hx   . Rectal cancer Neg Hx     History   Social History  . Marital Status: Divorced    Spouse Name: N/A    Number of Children: N/A  . Years of Education: N/A   Social History Main Topics  . Smoking status: Former Smoker    Quit date: 12/25/1964  . Smokeless tobacco: Never Used  . Alcohol Use: Yes     Comment: occ with steak dinner   . Drug Use: No  . Sexual Activity: None   Other Topics Concern  . 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 [place]      Spiritual Beliefs: Christian      Lifestyle: exercising on regular visit; trying to work on diet             Outpatient Encounter Prescriptions as of 11/04/2013  Medication Sig  . acetaminophen (TYLENOL) 500 MG tablet Take 500 mg by mouth every 8 (eight) hours.  Marland Kitchen allopurinol (ZYLOPRIM) 300 MG tablet TAKE 1 TABLET BY MOUTH  DAILY  . B-D UF III MINI PEN NEEDLES 31G X 5 MM MISC   . BIDIL 20-37.5 MG per tablet Take 1 tablet by mouth 2 (two) times daily.   . calcium-vitamin D (OSCAL 500/200 D-3) 500-200 MG-UNIT per tablet Take 1 tablet by mouth daily.  . cloNIDine (CATAPRES) 0.1 MG tablet TAKE 1 TABLET BY MOUTH TWICE DAILY.  . DULoxetine (CYMBALTA) 30 MG capsule Take 30 mg by mouth every morning.   . ferrous sulfate 325 (65 FE) MG tablet Take 325 mg by mouth daily with breakfast.  . LANTUS SOLOSTAR 100 UNIT/ML SOPN 23 Units daily.   Marland Kitchen loratadine (CLARITIN) 10 MG tablet Take 10 mg by mouth daily.  . metformin (FORTAMET) 1000 MG (OSM) 24 hr tablet Take 1 tablet (1,000 mg total) by mouth 2 (two) times daily with a meal.  . metoprolol (LOPRESSOR) 50 MG tablet TAKE 1 TABLET BY MOUTH ONCE DAILY.  Marland Kitchen NOVOLOG FLEXPEN 100 UNIT/ML SOPN FlexPen 3 Units 3 (three) times daily with meals.   . Olmesartan-Amlodipine-HCTZ (TRIBENZOR) 40-10-25 MG TABS Take 1 tablet by mouth every morning.  Letta Pate DELICA LANCETS FINE MISC   . polyethylene glycol (MIRALAX  / GLYCOLAX) packet Take 17 g by mouth daily.  Bernadette Hoit Sodium (STOOL SOFTENER & LAXATIVE PO) Take by mouth as needed.  . simvastatin (ZOCOR) 20 MG tablet Take 20 mg by mouth at bedtime.   Marland Kitchen SYNTHROID 125 MCG tablet TAKE 1 TABLET BY MOUTH DAILY  . vitamin B-12 (CYANOCOBALAMIN) 1000 MCG tablet Take 1,000 mcg by mouth daily.  Marland Kitchen albuterol (PROAIR HFA) 108 (90 BASE) MCG/ACT inhaler Inhale 2 puffs into the lungs every 6 (six) hours as needed for wheezing or shortness of breath.  Marland Kitchen HYDROcodone-homatropine (HYCODAN) 5-1.5 MG/5ML syrup Take 5 mLs by mouth every 4 (four) hours as needed for cough.  . predniSONE (DELTASONE) 20 MG tablet Take 3 po qd for 2 days then 2 po qd for 3 days,or as directed  . traMADol (ULTRAM) 50 MG tablet Take 50 mg by mouth every 6 (six) hours as needed for pain.  Marland Kitchen albuterol (PROVENTIL) (2.5 MG/3ML) 0.083% nebulizer solution 2.5 mg     EXAM:  BP 140/60  Pulse 76  Temp(Src) 98.2 F (36.8 C) (Oral)  Wt 253 lb (114.76 kg)  SpO2 98%  Body mass index is 47.04 kg/(m^2).  GENERAL: vitals reviewed and listed above, alert, oriented, appears well hydrated and in no acute distress wheezy cough  Nl speech   HEENT: atraumatic, conjunctiva  clear, no obvious abnormalities on inspection of external nose and ears tms clear  OP : no lesion edema or exudate  Tongue midline   NECK: no obvious masses on inspection palpation  No adenopathy don't see any JVD  LUNGS:   Bilateral diffuse expiratory wheezing few inspiratory no stridor no retractions no obvious rales  CV: HRRR, no clubbing cyanosis or   nl cap refill of gallops or murmurs are heard abdomen soft without organomegaly guarding or rebound  MS: moves all extremities without noticeable focal  Abnormality some assistance needed getting up onto the exam table.  PSYCH: pleasant and cooperative, no obvious depression or anxiety Albuterol neb  Slight incfrease in bs  No rales srtill wheezing  ASSESSMENT AND  PLAN:  Discussed the following assessment and plan:  Wheezing - Plan: DG Chest 2 View, albuterol (PROVENTIL) (2.5 MG/3ML) 0.083% nebulizer solution 2.5 mg  Dyspnea and respiratory abnormality - Plan: DG Chest 2 View, albuterol (PROVENTIL) (2.5 MG/3ML) 0.083% nebulizer  solution 2.5 mg  Acute wheezy bronchitis  Type II or unspecified type diabetes mellitus with peripheral circulatory disorders, uncontrolled(250.72)  -Patient advised to return or notify health care team  if symptoms worsen or persist or new concerns arise.  Patient Instructions  Get a chest x-ray today and we will let you know the results Been you a cortisone injection to decrease the inflammation in your lungs and help wheezing in the meantime. He can raise her blood sugar but you can monitor this and avoid extra sweets. We'll decide on treatment such as inhalers and antibiotics if needed after the x-rays done. Plan followup office visit this week in 2-3 days unless a lot better.   Neta Mends. Panosh M.D.  X ray normal    Will send in pred and albuterol and fu 2-3 days unless better  .

## 2013-11-04 NOTE — Telephone Encounter (Signed)
Patient Information:  Caller Name: Aaleyah  Phone: [phone]  [name], Katie Mcintyre  Gender: Female  DOB: January 30, 1941  Age: 72 Years  PCP: Selena Batten (TEXT 1st, after 20 mins can call), [facility]ia Client [facility])  Office Follow Up:  Does the office need to follow up with this patient?: No  Instructions For The Office: N/A  RN Note:  FBS 113 at 0700. Intermittent, "slight" right chest pain; not present now.  Symptoms  Reason For Call & Symptoms: Moist cough, chest congestion and wheezing when lies down.  Reviewed Health History In EMR: Yes  Reviewed Medications In EMR: Yes  Reviewed Allergies In EMR: Yes  Reviewed Surgeries / Procedures: Yes  Date of Onset of Symptoms: 11/02/2013  Treatments Tried: Acetaminophen  Treatments Tried Worked: Yes  Guideline(s) Used:  Cough  Disposition Per Guideline:   Go to Office Now  Reason For Disposition Reached:   Wheezing is present  Advice Given:  Reassurance  Coughing is the way that our lungs remove irritants and mucus. It helps protect our lungs from getting pneumonia.  Coughing Spasms:  Drink warm fluids. Inhale warm mist (Reason: both relax the airway and loosen up the phlegm).  Suck on cough drops or hard candy to coat the irritated throat.  Prevent Dehydration:  Drink adequate liquids.  This will help soothe an irritated or dry throat and loosen up the phlegm.  Expected Course:   The expected course depends on what is causing the cough.  Viral bronchitis (chest cold) causes a cough that lasts 1 to 3 weeks. Sometimes you may cough up lots of phlegm (sputum, mucus). The mucus can normally be white, gray, yellow, or green.  Call Back If:  Difficulty breathing  Cough lasts more than 3 weeks  Fever lasts > 3 days  You become worse.  Patient Will Follow Care Advice:  YES  Appointment Scheduled:  11/04/2013 14:15:00 Appointment Scheduled Provider:  Berniece Andreas [facility])

## 2013-11-04 NOTE — Addendum Note (Signed)
Addended by: Raj Janus T on: 11/04/2013 05:46 PM   Modules accepted: Orders

## 2013-11-17 ENCOUNTER — Encounter: Payer: Medicare Other | Admitting: Family Medicine

## 2013-11-17 ENCOUNTER — Encounter (HOSPITAL_COMMUNITY): Payer: Self-pay | Admitting: Emergency Medicine

## 2013-11-17 ENCOUNTER — Emergency Department (HOSPITAL_COMMUNITY): Payer: Medicare Other

## 2013-11-17 ENCOUNTER — Encounter: Payer: Self-pay | Admitting: Family Medicine

## 2013-11-17 ENCOUNTER — Emergency Department (HOSPITAL_COMMUNITY)
Admission: EM | Admit: 2013-11-17 | Discharge: 2013-11-17 | Disposition: A | Discharge status: Home / Self Care | Payer: Medicare Other | Attending: Emergency Medicine | Admitting: Emergency Medicine

## 2013-11-17 ENCOUNTER — Ambulatory Visit (INDEPENDENT_AMBULATORY_CARE_PROVIDER_SITE_OTHER): Payer: Medicare Other | Admitting: Family Medicine

## 2013-11-17 VITALS — BP 110/80 | HR 64 | Temp 97.7°F | Wt 248.0 lb

## 2013-11-17 DIAGNOSIS — Z8669 Personal history of other diseases of the nervous system and sense organs: Secondary | ICD-10-CM | POA: Insufficient documentation

## 2013-11-17 DIAGNOSIS — Z9889 Other specified postprocedural states: Secondary | ICD-10-CM | POA: Insufficient documentation

## 2013-11-17 DIAGNOSIS — R109 Unspecified abdominal pain: Secondary | ICD-10-CM

## 2013-11-17 DIAGNOSIS — M79609 Pain in unspecified limb: Secondary | ICD-10-CM | POA: Insufficient documentation

## 2013-11-17 DIAGNOSIS — F329 Major depressive disorder, single episode, unspecified: Secondary | ICD-10-CM | POA: Insufficient documentation

## 2013-11-17 DIAGNOSIS — E785 Hyperlipidemia, unspecified: Secondary | ICD-10-CM | POA: Insufficient documentation

## 2013-11-17 DIAGNOSIS — Z8541 Personal history of malignant neoplasm of cervix uteri: Secondary | ICD-10-CM | POA: Insufficient documentation

## 2013-11-17 DIAGNOSIS — R1031 Right lower quadrant pain: Secondary | ICD-10-CM | POA: Insufficient documentation

## 2013-11-17 DIAGNOSIS — Z862 Personal history of diseases of the blood and blood-forming organs and certain disorders involving the immune mechanism: Secondary | ICD-10-CM | POA: Insufficient documentation

## 2013-11-17 DIAGNOSIS — Z794 Long term (current) use of insulin: Secondary | ICD-10-CM | POA: Insufficient documentation

## 2013-11-17 DIAGNOSIS — M109 Gout, unspecified: Secondary | ICD-10-CM | POA: Insufficient documentation

## 2013-11-17 DIAGNOSIS — E669 Obesity, unspecified: Secondary | ICD-10-CM | POA: Insufficient documentation

## 2013-11-17 DIAGNOSIS — Z8619 Personal history of other infectious and parasitic diseases: Secondary | ICD-10-CM | POA: Insufficient documentation

## 2013-11-17 DIAGNOSIS — K219 Gastro-esophageal reflux disease without esophagitis: Secondary | ICD-10-CM | POA: Insufficient documentation

## 2013-11-17 DIAGNOSIS — D649 Anemia, unspecified: Secondary | ICD-10-CM | POA: Insufficient documentation

## 2013-11-17 DIAGNOSIS — Z90710 Acquired absence of both cervix and uterus: Secondary | ICD-10-CM | POA: Insufficient documentation

## 2013-11-17 DIAGNOSIS — Z860100 Personal history of colon polyps, unspecified: Secondary | ICD-10-CM | POA: Insufficient documentation

## 2013-11-17 DIAGNOSIS — E119 Type 2 diabetes mellitus without complications: Secondary | ICD-10-CM | POA: Insufficient documentation

## 2013-11-17 DIAGNOSIS — M79604 Pain in right leg: Secondary | ICD-10-CM

## 2013-11-17 DIAGNOSIS — R197 Diarrhea, unspecified: Secondary | ICD-10-CM | POA: Insufficient documentation

## 2013-11-17 DIAGNOSIS — F3289 Other specified depressive episodes: Secondary | ICD-10-CM | POA: Insufficient documentation

## 2013-11-17 DIAGNOSIS — Z79899 Other long term (current) drug therapy: Secondary | ICD-10-CM | POA: Insufficient documentation

## 2013-11-17 DIAGNOSIS — I10 Essential (primary) hypertension: Secondary | ICD-10-CM | POA: Insufficient documentation

## 2013-11-17 DIAGNOSIS — Z87891 Personal history of nicotine dependence: Secondary | ICD-10-CM | POA: Insufficient documentation

## 2013-11-17 DIAGNOSIS — Z87448 Personal history of other diseases of urinary system: Secondary | ICD-10-CM | POA: Insufficient documentation

## 2013-11-17 DIAGNOSIS — R110 Nausea: Secondary | ICD-10-CM | POA: Insufficient documentation

## 2013-11-17 DIAGNOSIS — R52 Pain, unspecified: Secondary | ICD-10-CM

## 2013-11-17 LAB — URINALYSIS, ROUTINE W REFLEX MICROSCOPIC
Glucose, UA: NEGATIVE mg/dL
Ketones, ur: NEGATIVE mg/dL
Leukocytes, UA: NEGATIVE
Nitrite: NEGATIVE
Specific Gravity, Urine: 1.008 (ref 1.005–1.030)
Urobilinogen, UA: 0.2 mg/dL (ref 0.0–1.0)
pH: 7 (ref 5.0–8.0)

## 2013-11-17 LAB — CBC WITH DIFFERENTIAL/PLATELET
Basophils Absolute: 0.1 10*3/uL (ref 0.0–0.1)
Basophils Relative: 1 % (ref 0–1)
Eosinophils Absolute: 0.2 10*3/uL (ref 0.0–0.7)
Eosinophils Relative: 4 % (ref 0–5)
HCT: 34.9 % — ABNORMAL LOW (ref 36.0–46.0)
Lymphs Abs: 1.4 10*3/uL (ref 0.7–4.0)
MCHC: 33.5 g/dL (ref 30.0–36.0)
MCV: 85.5 fL (ref 78.0–100.0)
Monocytes Absolute: 0.4 10*3/uL (ref 0.1–1.0)
Neutrophils Relative %: 57 % (ref 43–77)
RDW: 15.2 % (ref 11.5–15.5)

## 2013-11-17 LAB — LACTIC ACID, PLASMA: Lactic Acid, Venous: 1.3 mmol/L (ref 0.5–2.2)

## 2013-11-17 LAB — COMPREHENSIVE METABOLIC PANEL
ALT: 11 U/L (ref 0–35)
AST: 17 U/L (ref 0–37)
Albumin: 3.7 g/dL (ref 3.5–5.2)
Calcium: 9.3 mg/dL (ref 8.4–10.5)
Creatinine, Ser: 0.78 mg/dL (ref 0.50–1.10)
GFR calc non Af Amer: 81 mL/min — ABNORMAL LOW (ref >90)
Potassium: 4 mEq/L (ref 3.5–5.1)
Sodium: 134 mEq/L — ABNORMAL LOW (ref 135–145)

## 2013-11-17 LAB — D-DIMER, QUANTITATIVE (NOT AT ARMC): D-Dimer, Quant: <0.27 ug/mL-FEU (ref 0.00–0.48)

## 2013-11-17 MED ORDER — MORPHINE SULFATE 4 MG/ML IJ SOLN
4.0000 mg | Freq: Once | INTRAMUSCULAR | Status: AC
  Administered 2013-11-17: 4 mg via INTRAVENOUS
  Filled 2013-11-17: qty 1

## 2013-11-17 MED ORDER — HYDROCODONE-ACETAMINOPHEN 5-325 MG PO TABS: 1.0000 | ORAL_TABLET | Freq: Four times a day (QID) | ORAL | Status: DC | PRN

## 2013-11-17 MED ORDER — ONDANSETRON HCL 4 MG/2ML IJ SOLN
4.0000 mg | Freq: Once | INTRAMUSCULAR | Status: AC
  Administered 2013-11-17: 4 mg via INTRAVENOUS
  Filled 2013-11-17: qty 2

## 2013-11-17 MED ORDER — IOHEXOL 300 MG/ML  SOLN: 100.0000 mL | Freq: Once | INTRAMUSCULAR | Status: DC | PRN

## 2013-11-17 MED ORDER — IOHEXOL 300 MG/ML  SOLN
25.0000 mL | INTRAMUSCULAR | Status: AC
  Administered 2013-11-17: 25 mL via ORAL

## 2013-11-17 NOTE — ED Notes (Signed)
Pt. Able to tolerate fluids. Pt. Ambulated with some difficulty. Every couple steps patient would stop due to pain in right leg.

## 2013-11-17 NOTE — Progress Notes (Signed)
Error   This encounter was created in error - please disregard. 

## 2013-11-17 NOTE — Progress Notes (Signed)
Chief Complaint  Patient presents with  . Abdominal Pain    right lower    HPI:   Abdominal pain: 2 days ago -became severe (10/10) in RLQ this morning and has worsened over the course of the day  -pain is constant and sharp and radiates through to her back on the R side and down into groin on R side -drove herself but now does not feel like she can get out of wheelchair or drive anywhere due to severity of pain -denies: nausea, vomiting, diarrhea, fevers, hematochezia, melena, constipation, CP, SOB -does have persistent cough from recent bronchitis treated with prednisone -has had pain in her abd in the past, but never like this -can't even walk pain is so bad - in a wheelchair because pain is so bad -had colonoscopy 08/2013 with a few polyps and diverticulosis  -hx of GERD and constipation - took mirilax and colace this morning, had BM yesterday normal as well -last BM this morning and normal  -reports blood sugars have been ok  ROS: See pertinent positives and negatives per HPI.  Past Medical History  Diagnosis Date  . Hypertension   . Hyperlipidemia   . Diabetes mellitus without complication   . Thyroid disease   . Gout 06/30/2013  . Depression 06/30/2013  . GERD (gastroesophageal reflux disease)   . Spinal stenosis of lumbar region     s/p spine surgery 02/12/13 with Dr [facility]lean Alf at [facility]  . Anemia   . Arthritis   . Cancer     cervical  . Chicken pox   . Diverticulitis   . Chronic bronchitis   . Frequent headaches   . Colon polyp   . Urine incontinence     Past Surgical History  Procedure Laterality Date  . Back surgery  01/2013  . Cervical cone biopsy    . Abdominal hysterectomy  1970    complete hysterectomy for cervical cancer  . Back surgery  1995  . Wrist surgery  2010    for wrist fracture    Family History  Problem Relation Age of Onset  . Alcohol abuse Father   . Alcohol abuse Brother   . Arthritis      parents  . Hyperlipidemia      parent  .  Stroke      grandparent  . Hypertension      parent/grandparent  . Diabetes      parent/grandparent  . Breast cancer Sister   . Heart disease Other   . Mental illness Other   . Colon cancer Neg Hx   . Esophageal cancer Neg Hx   . Prostate cancer Neg Hx   . Rectal cancer Neg Hx     History   Social History  . Marital Status: Divorced    Spouse Name: N/A    Number of Children: N/A  . Years of Education: N/A   Social History Main Topics  . Smoking status: Former Smoker    Quit date: 12/25/1964  . Smokeless tobacco: Never Used  . Alcohol Use: Yes     Comment: occ with steak dinner   . Drug Use: No  . Sexual Activity: None   Other Topics Concern  . 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 [place]      Spiritual Beliefs: Christian      Lifestyle: exercising on regular visit; trying to work on diet  Current outpatient prescriptions:acetaminophen (TYLENOL) 500 MG tablet, Take 500 mg by mouth every 8 (eight) hours., Disp: , Rfl: ;  albuterol (PROAIR HFA) 108 (90 BASE) MCG/ACT inhaler, Inhale 2 puffs into the lungs every 6 (six) hours as needed for wheezing or shortness of breath., Disp: 1 Inhaler, Rfl: 0;  allopurinol (ZYLOPRIM) 300 MG tablet, TAKE 1 TABLET BY MOUTH DAILY, Disp: 30 tablet, Rfl: 5 B-D UF III MINI PEN NEEDLES 31G X 5 MM MISC, , Disp: , Rfl: ;  BIDIL 20-37.5 MG per tablet, Take 1 tablet by mouth 2 (two) times daily. , Disp: , Rfl: ;  calcium-vitamin D (OSCAL 500/200 D-3) 500-200 MG-UNIT per tablet, Take 1 tablet by mouth daily., Disp: , Rfl: ;  cloNIDine (CATAPRES) 0.1 MG tablet, TAKE 1 TABLET BY MOUTH TWICE DAILY., Disp: 60 tablet, Rfl: 1;  DULoxetine (CYMBALTA) 30 MG capsule, Take 30 mg by mouth every morning. , Disp: , Rfl:  ferrous sulfate 325 (65 FE) MG tablet, Take 325 mg by mouth daily with breakfast., Disp: , Rfl: ;  LANTUS SOLOSTAR 100 UNIT/ML SOPN, 23 Units  daily. , Disp: , Rfl: ;  loratadine (CLARITIN) 10 MG tablet, Take 10 mg by mouth daily., Disp: , Rfl: ;  metformin (FORTAMET) 1000 MG (OSM) 24 hr tablet, Take 1 tablet (1,000 mg total) by mouth 2 (two) times daily with a meal., Disp: 60 tablet, Rfl: 6 metoprolol (LOPRESSOR) 50 MG tablet, TAKE 1 TABLET BY MOUTH ONCE DAILY., Disp: 30 tablet, Rfl: 5;  NOVOLOG FLEXPEN 100 UNIT/ML SOPN FlexPen, 3 Units 3 (three) times daily with meals. , Disp: , Rfl: ;  Olmesartan-Amlodipine-HCTZ (TRIBENZOR) 40-10-25 MG TABS, Take 1 tablet by mouth every morning., Disp: 90 tablet, Rfl: 0;  ONETOUCH DELICA LANCETS FINE MISC, , Disp: , Rfl:  polyethylene glycol (MIRALAX / GLYCOLAX) packet, Take 17 g by mouth daily., Disp: , Rfl: ;  Sennosides-Docusate Sodium (STOOL SOFTENER & LAXATIVE PO), Take by mouth as needed., Disp: , Rfl: ;  simvastatin (ZOCOR) 20 MG tablet, Take 20 mg by mouth at bedtime. , Disp: , Rfl: ;  SYNTHROID 125 MCG tablet, TAKE 1 TABLET BY MOUTH DAILY, Disp: 30 tablet, Rfl: 11 traMADol (ULTRAM) 50 MG tablet, Take 50 mg by mouth every 6 (six) hours as needed for pain., Disp: , Rfl: ;  vitamin B-12 (CYANOCOBALAMIN) 1000 MCG tablet, Take 1,000 mcg by mouth daily., Disp: , Rfl:   EXAM:  Filed Vitals:   11/17/13 1459  BP: 110/80  Pulse: 64  Temp: 97.7 F (36.5 C)    Body mass index is 46.11 kg/(m^2).  GENERAL: tearful, in obvious pain - can not get out of wheelchair or on exam table without crying in pain  HEENT: atraumatic, conjunttiva clear, no obvious abnormalities on inspection of external nose and ears  NECK: no obvious masses on inspection  LUNGS: clear to auscultation bilaterally, no wheezes, rales or rhonchi, good air movement  CV: HRRR, no peripheral edema  ABD: BS+, sig TTP RLQ with ?rebound, no gaurding  MS: moves all extremities without noticeable abnormality  PSYCH: pleasant and cooperative, no obvious depression or anxiety  ASSESSMENT AND PLAN:  Discussed the following assessment  and plan:  Abdominal pain, acute, right lower quadrant  Continuous severe abdominal pain  72 yo F with MMP including uncontrolled DM, GERD and diverticulosis  presents with sudden onset of severe RLQ abd pain this morning. She can not get on exam table due to pain and feels she can not drive due to the  pain and reports was trying to get to the hospital but didn't know how to get there so came here. Vital signs ok, given severe pain to the point she is not able to drive for further studies/etc called EMS for transport to ED for immediate evaluation to R/O appendicitis or other acute serious pathologies. Of note, she is unsure if she has appendix or not. Staff to notify ED  There are no Patient Instructions on file for this visit.   Katie Basque R.

## 2013-11-17 NOTE — ED Notes (Signed)
Offered patient assistance with getting dressed and toileting, refused. Put call bell in reach and told patient to call nurse if needing help.

## 2013-11-17 NOTE — ED Provider Notes (Signed)
CSN: [id number]     Arrival date & time 11/17/13  1554 History   First MD Initiated Contact with Patient 11/17/13 1555     Chief Complaint  Patient presents with  . Abdominal Pain   (Consider location/radiation/quality/duration/timing/severity/associated sxs/prior Treatment) HPI  This is a 72 -year-old female with history hypertension, hyperlipidemia, diabetes who presents with abdominal pain. Patient reports 2 to three-day history of worsening right lower abdominal pain. Initially she stated that the pain was worse when she puts pressure on her right leg. She reports that it is sharp and nonradiating. It is in the right lower quadrant. Currently the pain is 7/10. It acutely worsened today. Patient denies any fevers. She does endorse nausea without vomiting. She's had more frequent loose stools. She denies any bloody stools. Patient denies any chest pain or shortness of breath.  Past Medical History  Diagnosis Date  . Hypertension   . Hyperlipidemia   . Diabetes mellitus without complication   . Thyroid disease   . Gout 06/30/2013  . Depression 06/30/2013  . GERD (gastroesophageal reflux disease)   . Spinal stenosis of lumbar region     s/p spine surgery 02/12/13 with Dr Gearlean Alf at [facility]  . Anemia   . Arthritis   . Cancer     cervical  . Chicken pox   . Diverticulitis   . Chronic bronchitis   . Frequent headaches   . Colon polyp   . Urine incontinence    Past Surgical History  Procedure Laterality Date  . Back surgery  01/2013  . Cervical cone biopsy    . Abdominal hysterectomy  1970    complete hysterectomy for cervical cancer  . Back surgery  1995  . Wrist surgery  2010    for wrist fracture   Family History  Problem Relation Age of Onset  . Alcohol abuse Father   . Alcohol abuse Brother   . Arthritis      parents  . Hyperlipidemia      parent  . Stroke      grandparent  . Hypertension      parent/grandparent  . Diabetes      parent/grandparent  . Breast cancer  Sister   . Heart disease Other   . Mental illness Other   . Colon cancer Neg Hx   . Esophageal cancer Neg Hx   . Prostate cancer Neg Hx   . Rectal cancer Neg Hx    History  Substance Use Topics  . Smoking status: Former Smoker    Quit date: 12/25/1964  . Smokeless tobacco: Never Used  . Alcohol Use: Yes     Comment: occ with steak dinner    OB History   Grav Para Term Preterm Abortions TAB SAB Ect Mult Living                 Review of Systems  Constitutional: Negative for fever.  Respiratory: Negative for cough, chest tightness and shortness of breath.   Cardiovascular: Negative for chest pain.  Gastrointestinal: Positive for nausea and abdominal pain. Negative for vomiting and abdominal distention.  Genitourinary: Negative for dysuria.  Musculoskeletal: Negative for back pain.  Skin: Negative for wound.  All other systems reviewed and are negative.    Allergies  Advil and Lyrica  Home Medications   Current Outpatient Rx  Name  Route  Sig  Dispense  Refill  . acetaminophen (TYLENOL) 500 MG tablet   Oral   Take 500 mg by mouth every 8 (  eight) hours.         Marland Kitchen albuterol (PROAIR HFA) 108 (90 BASE) MCG/ACT inhaler   Inhalation   Inhale 2 puffs into the lungs every 6 (six) hours as needed for wheezing or shortness of breath.   1 Inhaler   0   . allopurinol (ZYLOPRIM) 300 MG tablet   Oral   Take 300 mg by mouth daily.         Marland Kitchen BIDIL 20-37.5 MG per tablet   Oral   Take 1 tablet by mouth 2 (two) times daily.          . calcium-vitamin D (OSCAL 500/200 D-3) 500-200 MG-UNIT per tablet   Oral   Take 1 tablet by mouth daily.         . cloNIDine (CATAPRES) 0.1 MG tablet   Oral   Take 0.1 mg by mouth 2 (two) times daily.         . DULoxetine (CYMBALTA) 30 MG capsule   Oral   Take 30 mg by mouth every morning.          . ferrous sulfate 325 (65 FE) MG tablet   Oral   Take 325 mg by mouth daily with breakfast.         . LANTUS SOLOSTAR 100  UNIT/ML SOPN   Subcutaneous   Inject 23 Units into the skin daily.          Marland Kitchen levothyroxine (SYNTHROID, LEVOTHROID) 125 MCG tablet   Oral   Take 125 mcg by mouth daily before breakfast.         . metformin (FORTAMET) 1000 MG (OSM) 24 hr tablet   Oral   Take 1 tablet (1,000 mg total) by mouth 2 (two) times daily with a meal.   60 tablet   6   . metoprolol (LOPRESSOR) 50 MG tablet   Oral   Take 50 mg by mouth daily.         Marland Kitchen NOVOLOG FLEXPEN 100 UNIT/ML SOPN FlexPen   Subcutaneous   Inject 3 Units into the skin 3 (three) times daily with meals.          . Olmesartan-Amlodipine-HCTZ (TRIBENZOR) 40-10-25 MG TABS   Oral   Take 1 tablet by mouth every morning.   90 tablet   0   . polyethylene glycol (MIRALAX / GLYCOLAX) packet   Oral   Take 17 g by mouth daily.         Bernadette Hoit Sodium (STOOL SOFTENER & LAXATIVE PO)   Oral   Take 1 tablet by mouth daily as needed (constipation).          . simvastatin (ZOCOR) 20 MG tablet   Oral   Take 20 mg by mouth at bedtime.          . vitamin B-12 (CYANOCOBALAMIN) 1000 MCG tablet   Oral   Take 1,000 mcg by mouth daily.         . B-D UF III MINI PEN NEEDLES 31G X 5 MM MISC               . HYDROcodone-acetaminophen (NORCO/VICODIN) 5-325 MG per tablet   Oral   Take 1-2 tablets by mouth every 6 (six) hours as needed.   10 tablet   0   . ONETOUCH DELICA LANCETS FINE MISC                BP 158/66  Pulse 59  Temp(Src) 97.7 F (36.5 C) (Oral)  Resp  19  SpO2 96% Physical Exam  Nursing note and vitals reviewed. Constitutional: She is oriented to person, place, and time.  Obese, uncomfortable appearing, no acute distress  HENT:  Head: Normocephalic and atraumatic.  Mouth/Throat: Oropharynx is clear and moist.  Eyes: Pupils are equal, round, and reactive to light.  Cardiovascular: Normal rate, regular rhythm and normal heart sounds.   No murmur heard. Pulmonary/Chest: Effort normal. No  respiratory distress.  Abdominal: Soft. Bowel sounds are normal. She exhibits no distension and no mass. There is tenderness. There is no rebound and no guarding.  Tenderness to palpation over the right lower quadrant without rebound or guarding  Neurological: She is alert and oriented to person, place, and time.  Skin: Skin is warm and dry.  Psychiatric: She has a normal mood and affect.    ED Course  Procedures (including critical care time) Labs Review Labs Reviewed  CBC WITH DIFFERENTIAL - Abnormal; Notable for the following:    Hemoglobin 11.7 (*)    HCT 34.9 (*)    All other components within normal limits  COMPREHENSIVE METABOLIC PANEL - Abnormal; Notable for the following:    Sodium 134 (*)    Glucose, Bld 129 (*)    GFR calc non Af Amer 81 (*)    All other components within normal limits  LIPASE, BLOOD - Abnormal; Notable for the following:    Lipase 61 (*)    All other components within normal limits  LACTIC ACID, PLASMA  URINALYSIS, ROUTINE W REFLEX MICROSCOPIC  D-DIMER, QUANTITATIVE   Imaging Review Dg Hip Complete Right  11/17/2013   CLINICAL DATA:  Right-sided abdominal pain. Possible right hip pain. No trauma. Increasing pain for 2 days.  EXAM: RIGHT HIP - COMPLETE 2+ VIEW  COMPARISON:  None.  FINDINGS: There is residual contrast material in the bowel and bladder, likely resulting from recent CT scan. Degenerative changes in the right hip with narrowed acetabular space and sclerosis and hypertrophic changes on both sides of the joint. Degenerative changes are also present in the left hip. No displaced fractures are identified.  IMPRESSION: Degenerative changes in the hips.  No displaced fractures identified   Electronically Signed   By: Burman Nieves M.D.   On: 11/17/2013 21:17   Ct Abdomen Pelvis W Contrast  11/17/2013   CLINICAL DATA:  Right-sided abdominal pain.  Recent colonoscopy.  EXAM: CT ABDOMEN AND PELVIS WITH CONTRAST  TECHNIQUE: Multidetector CT imaging  of the abdomen and pelvis was performed using the standard protocol following bolus administration of intravenous contrast.  CONTRAST:  100 cc Omnipaque 300  COMPARISON:  None.  FINDINGS: Contrast medium in the distal esophagus suggests gastroesophageal reflux.  Body habitus reduces diagnostic sensitivity and specificity. The liver, spleen, pancreas, and adrenal glands appear unremarkable.  There is fullness of the right collecting system associated with a retrocaval ureter. The left ureter in urinary bladder appear unremarkable. There is a vascular calcification adjacent to the right ureter on image 55 of series 2 but this is clearly outside of the ureter. Mildly asymmetric right para renal stranding noted several renal cysts are present on the left.  Postoperative findings in the lumbar spine. Aortoiliac atherosclerotic vascular disease.  The cecum is near the midline appendix not well seen.  No free pelvic fluid.  Uterus absent.  Ovaries not well seen.  Small umbilical hernia contains adipose tissue. Pelvic floor laxity noted with the urinary bladder extending below the pubococcygeal line. Multilevel facet arthropathy in the lumbar spine is  present, causing multilevel foraminal stenosis.  Degenerative findings are present in the acetabula, right greater than left.  IMPRESSION: 1. Mild fullness of the right collecting system with mildly asymmetric right para renal stranding, likely due to the retrocaval ureter, an anatomic variant. Currently there is no overt hydronephrosis. 2. Atherosclerosis. 3. Gastroesophageal reflux. 4. Swallow umbilical hernia contains adipose tissue. 5. Pelvic floor laxity. 6. Prominent lumbar spondylosis causing multilevel foraminal impingement.   Electronically Signed   By: Herbie Baltimore M.D.   On: 11/17/2013 19:35    EKG Interpretation   None       MDM   1. Abdominal pain   2. Leg pain, anterior, right      Patient presents with a 2 to three-day history of right lower  quadrant abdominal pain. It is exacerbated by walking. She is nontoxic-appearing on exam her vital signs are within normal limits. She no evidence of peritonitis on exam. Basic lab work including lactate is reassuring. Patient was sent for a CT scan of the abdomen which is negative for acute intra-abdominal pathology.    On reexamination the patient, she reports persistent right lower quadrant pain that is exacerbated with right lower leg movement. Patient has full range of motion at the hip but an x-ray was ordered to rule out occult fracture. D-dimer was also sent to evaluate for DVT. Hip x-rays negative for acute fracture. D-dimer is also within normal limits. Patient was able to ambulate and tolerate by mouth prior to discharge. She will be referred back to her primary care physician. She will be given a short course of pain medication. Patient was given strict return precautions.  After history, exam, and medical workup I feel the patient has been appropriately medically screened and is safe for discharge home. Pertinent diagnoses were discussed with the patient. Patient was given return precautions.     Shon Baton, MD 11/17/13 [phone]

## 2013-11-17 NOTE — ED Notes (Signed)
Pt arrived by [facility] from MD office with c/o Right upper and lower abd pain. Pt had a recent colonoscopy done that was normal. Denies any N/V. Abdomen pain started 2 days ago but then today became worse and sharp.

## 2013-11-25 ENCOUNTER — Encounter: Payer: Self-pay | Admitting: Family Medicine

## 2013-11-25 ENCOUNTER — Ambulatory Visit (INDEPENDENT_AMBULATORY_CARE_PROVIDER_SITE_OTHER): Payer: Medicare Other | Admitting: Family Medicine

## 2013-11-25 VITALS — BP 160/70 | Temp 98.0°F | Wt 249.0 lb

## 2013-11-25 DIAGNOSIS — I10 Essential (primary) hypertension: Secondary | ICD-10-CM

## 2013-11-25 DIAGNOSIS — E785 Hyperlipidemia, unspecified: Secondary | ICD-10-CM

## 2013-11-25 DIAGNOSIS — E1159 Type 2 diabetes mellitus with other circulatory complications: Secondary | ICD-10-CM

## 2013-11-25 LAB — MICROALBUMIN / CREATININE URINE RATIO
Creatinine,U: 162.9 mg/dL
Microalb Creat Ratio: 13.8 mg/g (ref 0.0–30.0)
Microalb, Ur: 22.4 mg/dL — ABNORMAL HIGH (ref 0.0–1.9)

## 2013-11-25 LAB — LIPID PANEL
Cholesterol: 147 mg/dL (ref 0–200)
LDL Cholesterol: 54 mg/dL (ref 0–99)
Total CHOL/HDL Ratio: 3
Triglycerides: 191 mg/dL — ABNORMAL HIGH (ref 0.0–149.0)

## 2013-11-25 NOTE — Progress Notes (Signed)
Chief Complaint  Patient presents with  . Follow-up    HPI:  ED Follow up:  Seen 11/24 here and then in ED for severe RLQ and R hip pain Had labs and CT in ED that were re-assuring DDD sig on CT with multilevel foraminal impingement and she also has si OA of the L hip on review of studies She was given pain medication in the ED She reports today: feels much better Denies: fevers, chills, recurring pain She needs follow up for her chronic disease as she has been non-compliant with recommendations However, she reports home BS: 70-100s, one 65, and one 220 after eating the wrong thing  She had chronic back issues: -reports had surgery with Dr. Gearlean Alf with extensive rehab after  -thinks she needs to get back in with him after this episode and she will call today   ROS: See pertinent positives and negatives per HPI.  Past Medical History  Diagnosis Date  . Hypertension   . Hyperlipidemia   . Diabetes mellitus without complication   . Thyroid disease   . Gout 06/30/2013  . Depression 06/30/2013  . GERD (gastroesophageal reflux disease)   . Spinal stenosis of lumbar region     s/p spine surgery 02/12/13 with Dr Gearlean Alf at [facility]  . Anemia   . Arthritis   . Cancer     cervical  . Chicken pox   . Diverticulitis   . Chronic bronchitis   . Frequent headaches   . Colon polyp   . Urine incontinence     Past Surgical History  Procedure Laterality Date  . Back surgery  01/2013  . Cervical cone biopsy    . Abdominal hysterectomy  1970    complete hysterectomy for cervical cancer  . Back surgery  1995  . Wrist surgery  2010    for wrist fracture    Family History  Problem Relation Age of Onset  . Alcohol abuse Father   . Alcohol abuse Brother   . Arthritis      parents  . Hyperlipidemia      parent  . Stroke      grandparent  . Hypertension      parent/grandparent  . Diabetes      parent/grandparent  . Breast cancer Sister   . Heart disease Other   . Mental  illness Other   . Colon cancer Neg Hx   . Esophageal cancer Neg Hx   . Prostate cancer Neg Hx   . Rectal cancer Neg Hx     History   Social History  . Marital Status: Divorced    Spouse Name: N/A    Number of Children: N/A  . Years of Education: N/A   Social History Main Topics  . Smoking status: Former Smoker    Quit date: 12/25/1964  . Smokeless tobacco: Never Used  . Alcohol Use: Yes     Comment: occ with steak dinner   . Drug Use: No  . Sexual Activity: None   Other Topics Concern  . 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 [place]      Spiritual Beliefs: Christian      Lifestyle: exercising on regular visit; trying to work on diet             Current outpatient prescriptions:acetaminophen (TYLENOL) 500 MG tablet, Take 500 mg by mouth every 8 (eight) hours., Disp: ,  Rfl: ;  albuterol (PROAIR HFA) 108 (90 BASE) MCG/ACT inhaler, Inhale 2 puffs into the lungs every 6 (six) hours as needed for wheezing or shortness of breath., Disp: 1 Inhaler, Rfl: 0;  allopurinol (ZYLOPRIM) 300 MG tablet, Take 300 mg by mouth daily., Disp: , Rfl:  B-D UF III MINI PEN NEEDLES 31G X 5 MM MISC, , Disp: , Rfl: ;  BIDIL 20-37.5 MG per tablet, Take 1 tablet by mouth 2 (two) times daily. , Disp: , Rfl: ;  calcium-vitamin D (OSCAL 500/200 D-3) 500-200 MG-UNIT per tablet, Take 1 tablet by mouth daily., Disp: , Rfl: ;  cloNIDine (CATAPRES) 0.1 MG tablet, Take 0.1 mg by mouth 2 (two) times daily., Disp: , Rfl: ;  DULoxetine (CYMBALTA) 30 MG capsule, Take 30 mg by mouth every morning. , Disp: , Rfl:  ferrous sulfate 325 (65 FE) MG tablet, Take 325 mg by mouth daily with breakfast., Disp: , Rfl: ;  LANTUS SOLOSTAR 100 UNIT/ML SOPN, Inject 23 Units into the skin daily. , Disp: , Rfl: ;  levothyroxine (SYNTHROID, LEVOTHROID) 125 MCG tablet, Take 125 mcg by mouth daily before breakfast., Disp: , Rfl: ;  metformin (FORTAMET)  1000 MG (OSM) 24 hr tablet, Take 1 tablet (1,000 mg total) by mouth 2 (two) times daily with a meal., Disp: 60 tablet, Rfl: 6 metoprolol (LOPRESSOR) 50 MG tablet, Take 50 mg by mouth daily., Disp: , Rfl: ;  NOVOLOG FLEXPEN 100 UNIT/ML SOPN FlexPen, Inject 3 Units into the skin 3 (three) times daily with meals. , Disp: , Rfl: ;  Olmesartan-Amlodipine-HCTZ (TRIBENZOR) 40-10-25 MG TABS, Take 1 tablet by mouth every morning., Disp: 90 tablet, Rfl: 0;  ONE TOUCH ULTRA TEST test strip, , Disp: , Rfl: ;  ONETOUCH DELICA LANCETS FINE MISC, , Disp: , Rfl:  polyethylene glycol (MIRALAX / GLYCOLAX) packet, Take 17 g by mouth daily., Disp: , Rfl: ;  Sennosides-Docusate Sodium (STOOL SOFTENER & LAXATIVE PO), Take 1 tablet by mouth daily as needed (constipation). , Disp: , Rfl: ;  simvastatin (ZOCOR) 20 MG tablet, Take 20 mg by mouth at bedtime. , Disp: , Rfl: ;  vitamin B-12 (CYANOCOBALAMIN) 1000 MCG tablet, Take 1,000 mcg by mouth daily., Disp: , Rfl:  HYDROcodone-acetaminophen (NORCO/VICODIN) 5-325 MG per tablet, Take 1-2 tablets by mouth every 6 (six) hours as needed., Disp: 10 tablet, Rfl: 0  EXAM:  Filed Vitals:   11/25/13 1324  BP: 160/70  Temp: 98 F (36.7 C)    Body mass index is 46.29 kg/(m^2).  GENERAL: vitals reviewed and listed above, alert, oriented, appears well hydrated and in no acute distress  HEENT: atraumatic, conjunttiva clear, no obvious abnormalities on inspection of external nose and ears  NECK: no obvious masses on inspection  LUNGS: clear to auscultation bilaterally, no wheezes, rales or rhonchi, good air movement  CV: HRRR, no peripheral edema  MS: moves all extremities without noticeable abnormality  PSYCH: pleasant and cooperative, no obvious depression or anxiety  ASSESSMENT AND PLAN:  Discussed the following assessment and plan:  Hyperlipemia  Type II or unspecified type diabetes mellitus with peripheral circulatory disorders, uncontrolled(250.72)  Essential  hypertension, benign  -abd pain resolved, she thinks had flare of back issues and is going to schedule appt with her back doctor at [facility] -BP a little up but took her meds right before she got here and has been running around all day -will check NON-FASTING labs today  -advised to schedule a follow up appt for chronic disease management, recheck  BP and to go over labs in about 1-2 months -Patient advised to return or notify a doctor immediately if symptoms worsen or persist or new concerns arise.  There are no Patient Instructions on file for this visit.   Kriste Basque R.

## 2013-11-25 NOTE — Progress Notes (Signed)
Pre visit review using our clinic review tool, if applicable. No additional management support is needed unless otherwise documented below in the visit note. 

## 2013-11-26 ENCOUNTER — Encounter: Payer: Self-pay | Admitting: *Deleted

## 2013-11-26 ENCOUNTER — Encounter: Payer: Medicare Other | Attending: Family Medicine | Admitting: *Deleted

## 2013-11-26 VITALS — Ht 61.5 in | Wt 249.5 lb

## 2013-11-26 DIAGNOSIS — Z713 Dietary counseling and surveillance: Secondary | ICD-10-CM | POA: Insufficient documentation

## 2013-11-26 DIAGNOSIS — E1159 Type 2 diabetes mellitus with other circulatory complications: Secondary | ICD-10-CM

## 2013-11-26 NOTE — Patient Instructions (Signed)
Plan:  Continue with your current activity level as tolerated Consider checking BG at alternate times per day including 2 hour post meal and after exercise as able  Continue taking insulin as directed by MD

## 2013-11-26 NOTE — Progress Notes (Signed)
Appt start time: 0900 end time:  1030.  Assessment:  Patient was seen on  11/26/13 for individual diabetes education. States history of diabetes for over 25 years. Lives with her son, but getting ready to move into her own place. She does her own food shopping and meal preparation. No fast food unless absolutely necessary. Attends the [facility] for exercise 2-3 days a week unless sick. She attends chair yogo and low impact exercise classes. She usually enjoys Haematologist, working on Animator, and Engineer, water. She used to sew and crochet, that is in storage right now.  Current HbA1c: 7.7% down from 7.9% 3 months ago  Preferred Learning Style:   No preference indicated   Learning Readiness:   Change in progress  MEDICATIONS: see list, diabetes medications include Lantus, Novolog and Metformin  DIETARY INTAKE:  24-hr recall:  B ( AM): 1 pkg flavored oatmeal with extra regular oatmeal, wheat germ, 1 tbsp raisins OR 2 eggs occasionally with toast OR raisin bagel and cream cheese OR yogurt, coffee with Splenda and flavored Coffee Mate Snk ( AM): no, unless a fresh fruit  L ( PM): salad in summer time OR home made chicken or black bean soup and fresh fruit or yogurt, Crystal Light with added fresh lemon  Snk ( PM): no D (6 PM): lean meat, starch, vegetable OR casserole with ground beef, water or Crystal Light Snk (8 PM): home made GORP with peanuts, raisins and coconut mixed together OR handful of chips OR fresh fruit Beverages: coffee, Crystal Light, water  Usual physical activity: goes to [employer] to exercise 2-3 days a week when feeling well  Estimated energy needs: 1400 calories 158 g carbohydrates 105 g protein 39  g fat    Intervention:  Nutrition counseling provided.  Discussed diabetes disease process and treatment options.  Discussed physiology of diabetes and role of obesity on insulin resistance.  Encouraged moderate weight reduction to improve glucose  levels.  Discussed role of medications and diet in glucose control  Discussed effects of physical activity on glucose levels and long-term glucose control.  Recommended 150 minutes of physical activity/week.  Discussed blood glucose monitoring and interpretation.  Discussed recommended target ranges and individual ranges.    Described short-term complications: hyper- and hypo-glycemia.  Discussed causes,symptoms, and treatment options.  Discussed role of stress on blood glucose levels and discussed strategies to manage psychosocial issues. Provided information and plan to discuss at next visit:  Macronutrients on glucose levels.  Provided education on carb counting, importance of regularly scheduled meals/snacks, and meal planning  Medications.  Discussed role of medication on blood glucose and possible side effects  Prevention, detection, and treatment of long-term complications.  Discussed the role of prolonged elevated glucose levels on body systems.  Discussed recommendations for long-term diabetes self-care.  Established checklist for medical, dental, and emotional self-care.  Plan:  Continue with your current activity level as tolerated Consider checking BG at alternate times per day including 2 hour post meal and after exercise as able  Continue taking insulin as directed by MD  Teaching Method Utilized: all of the following Visual Auditory Hands on  Handouts given during visit include: Living Well with Diabetes  Barriers to learning/adherence to lifestyle change: recent move from [place], still getting settled, plus recent illnesses   Diabetes self-care support plan:   [facility] support group available  Demonstrated degree of understanding via:  Teach Back   Monitoring/Evaluation:  Dietary intake, exercise, SMBG, and body weight in  6 week(s).

## 2013-11-30 ENCOUNTER — Other Ambulatory Visit: Payer: Self-pay | Admitting: Family Medicine

## 2013-12-27 ENCOUNTER — Other Ambulatory Visit: Payer: Self-pay | Admitting: Family Medicine

## 2013-12-31 ENCOUNTER — Ambulatory Visit: Payer: Medicare Other | Admitting: *Deleted

## 2014-01-11 ENCOUNTER — Telehealth: Payer: Self-pay | Admitting: Family Medicine

## 2014-01-11 DIAGNOSIS — E1159 Type 2 diabetes mellitus with other circulatory complications: Secondary | ICD-10-CM

## 2014-01-11 NOTE — Telephone Encounter (Signed)
[employer] requesting new scripts for the following:  Accu-Chek Aviva Plus Meter Acce-Chek Aviva Plus Test Strp Accu-Chek Softclix Lancets Accu-Chek Aviva Solution BD Single Use Swab

## 2014-01-12 MED ORDER — ACCU-CHEK SOFTCLIX LANCETS MISC: Status: DC

## 2014-01-12 MED ORDER — BD SWAB SINGLE USE REGULAR PADS: MEDICATED_PAD | Status: DC

## 2014-01-12 MED ORDER — ACCU-CHEK AVIVA PLUS W/DEVICE KIT
PACK | Status: DC
Start: 2014-01-12 — End: 2014-08-26

## 2014-01-12 MED ORDER — GLUCOSE BLOOD VI STRP: ORAL_STRIP | Status: DC

## 2014-01-12 MED ORDER — ACCU-CHEK AVIVA VI SOLN: Status: AC

## 2014-01-12 NOTE — Telephone Encounter (Signed)
Sent to [employer].

## 2014-01-15 ENCOUNTER — Telehealth: Payer: Self-pay | Admitting: Family Medicine

## 2014-01-15 NOTE — Telephone Encounter (Signed)
Error/gd °

## 2014-01-16 ENCOUNTER — Telehealth: Payer: Self-pay

## 2014-01-16 MED ORDER — VENLAFAXINE HCL 37.5 MG PO TABS: 37.5000 mg | ORAL_TABLET | Freq: Two times a day (BID) | ORAL | Status: DC

## 2014-01-16 NOTE — Telephone Encounter (Signed)
She is following up next month but may run out of medication before then.  She is ok with switching if you say she should.

## 2014-01-16 NOTE — Telephone Encounter (Signed)
We may need to schedule an appointment to discuss alternatives/etc. She needed a follow up appointment regardless. Please have her bring the letter regarding the metformin as there is not an alternative for metformin and it is the best oral medication for diabetes. Perhaps her insurance prefers she take it twice per day instead of the once daily Fortamet.

## 2014-01-16 NOTE — Telephone Encounter (Signed)
Ok to switch. Stop the duloxetine and start the venlafaxine. I sent Rx for one month supply. If she has any issues with this switch she should notify us. We will see her in February.

## 2014-01-16 NOTE — Telephone Encounter (Signed)
02/02/14 is the follow up appointment.

## 2014-01-16 NOTE — Telephone Encounter (Signed)
Received a letter from [facility] stating that pt's refill of duloxetine hcl dr 30 mg cap has a quality limit.  Pt received a temporary supply of medication through transitional policy and an alternate medication is needed or an exception for coverage of a non covered drug may be requested. Tonya checked online and venlafaxine is an approved medication.  Called and spoke with pt and pt states she received the same letter and got one about metformin.  Pls advise if you think pt should switch. And pt would like to reduce dosage to 30 mg; pt usually takes 3 pills in the morning.

## 2014-01-19 MED ORDER — VENLAFAXINE HCL 37.5 MG PO TABS: 37.5000 mg | ORAL_TABLET | Freq: Two times a day (BID) | ORAL | Status: DC

## 2014-01-19 NOTE — Telephone Encounter (Signed)
Called and spoke with pt and pt is aware of medication change.  Pt states she would like to have RX sent to local pharmacy ([facility]).  New rx sent there and I will call [facility] to cancel that rx.   Called [facility] and spoke with Helene Kelp to have Effexor 37.5 discontinued.  Sent to pharmacist to verify that pt wanted rx sent to local pharmacy.

## 2014-02-02 ENCOUNTER — Ambulatory Visit (INDEPENDENT_AMBULATORY_CARE_PROVIDER_SITE_OTHER): Payer: Medicare PPO | Admitting: Family Medicine

## 2014-02-02 ENCOUNTER — Encounter: Payer: Self-pay | Admitting: Family Medicine

## 2014-02-02 VITALS — BP 120/60 | Temp 97.9°F | Wt 258.0 lb

## 2014-02-02 DIAGNOSIS — I10 Essential (primary) hypertension: Secondary | ICD-10-CM

## 2014-02-02 DIAGNOSIS — E1159 Type 2 diabetes mellitus with other circulatory complications: Secondary | ICD-10-CM

## 2014-02-02 DIAGNOSIS — E785 Hyperlipidemia, unspecified: Secondary | ICD-10-CM

## 2014-02-02 NOTE — Progress Notes (Signed)
Pre visit review using our clinic review tool, if applicable. No additional management support is needed unless otherwise documented below in the visit note. 

## 2014-02-02 NOTE — Patient Instructions (Addendum)
DIABETES: -take lantus 25 units daily in the morning  -take 5 units of novolog with each meal  -check blood sugar and keep log: FASTING (first thing in the morning) POST-PRANDIAL) 2 hours after  WHEN you eat - take your novolog and set your alarm for 2 hours to remind you to check post-prandial  If any lows <70: take a snack immediately and recheck in 15-30 minutes, call 911 if feeling very bad, let us know  Follow up in 1 month

## 2014-02-02 NOTE — Progress Notes (Signed)
Chief Complaint  Patient presents with  . Follow-up    HPI:  Follow up diabetes: -of note, did not follow up as advised and last diabetes visit was in 07/2013 - she was asked to call with postprandial BS so that we could adjust her mealtime insulin, but she did not. She also was reminded by phone to check postprandial BS and bring to appointment. She was also advised to take lantus 25 unitis in the morning. -did see diabetes eductor -she was to see podiatry and optho after the last visit -home BS: fasting 98-182, postprandial (not checking) -diet and exercise: member of silver sneakers but not going; diet is terrible and this has made number worse  -current meds: 23 units of lantus daily in the morning, did not increased; takes novolog 4-5 units before each meal, metformin 107m bid -last eye exam: recently -last foot exam 07/2013 Lab Results  Component Value Date   HGBA1C 7.7* 11/25/2013    HTN/HLD/Obesity: -see lifestyle above -continues medications   ROS: See pertinent positives and negatives per HPI.  Past Medical History  Diagnosis Date  . Hypertension   . Hyperlipidemia   . Diabetes mellitus without complication   . Thyroid disease   . Gout 06/30/2013  . Depression 06/30/2013  . GERD (gastroesophageal reflux disease)   . Spinal stenosis of lumbar region     [facility] . Anemia   . Arthritis   . Cancer     cervical  . Chicken pox   . Diverticulitis   . Chronic bronchitis   . Frequent headaches   . Colon polyp   . Urine incontinence     Past Surgical History  Procedure Laterality Date  . Back surgery  01/2013  . Cervical cone biopsy    . Abdominal hysterectomy  1970    complete hysterectomy for cervical cancer  . Back surgery  1995  . Wrist surgery  2010    for wrist fracture    Family History  Problem Relation Age of Onset  . Alcohol abuse Father   . Alcohol abuse Brother   . Arthritis      parents  .  Hyperlipidemia      parent  . Stroke      grandparent  . Hypertension      parent/grandparent  . Diabetes      parent/grandparent  . Breast cancer Sister   . Heart disease Other   . Mental illness Other   . Colon cancer Neg Hx   . Esophageal cancer Neg Hx   . Prostate cancer Neg Hx   . Rectal cancer Neg Hx     History   Social History  . Marital Status: Divorced    Spouse Name: N/A    Number of Children: N/A  . Years of Education: N/A   Social History Main Topics  . Smoking status: Former Smoker    Quit date: 12/25/1964  . Smokeless tobacco: Never Used  . Alcohol Use: Yes     Comment: occ with steak dinner   . Drug Use: No  . Sexual Activity: None   Other Topics Concern  . 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[place]; trying to work on diet  Current outpatient prescriptions:ACCU-CHEK SOFTCLIX LANCETS lancets, Use as instructed, Disp: 100 each, Rfl: 12;  acetaminophen (TYLENOL) 500 MG tablet, Take 500 mg by mouth every 8 (eight) hours., Disp: , Rfl: ;  albuterol (PROAIR HFA) 108 (90 BASE) MCG/ACT inhaler, Inhale 2 puffs into the lungs every 6 (six) hours as needed for wheezing or shortness of breath., Disp: 1 Inhaler, Rfl: 0 Alcohol Swabs (B-D SINGLE USE SWABS REGULAR) PADS, Use as directed., Disp: 100 each, Rfl: 5;  allopurinol (ZYLOPRIM) 300 MG tablet, Take 300 mg by mouth daily., Disp: , Rfl: ;  aspirin 81 MG tablet, Take 81 mg by mouth daily., Disp: , Rfl: ;  B-D UF III MINI PEN NEEDLES 31G X 5 MM MISC, , Disp: , Rfl: ;  BIDIL 20-37.5 MG per tablet, Take 1 tablet by mouth 2 (two) times daily. , Disp: , Rfl:  Blood Glucose Calibration (ACCU-CHEK AVIVA) SOLN, Use as directed., Disp: 1 each, Rfl: 1;  Blood Glucose Monitoring Suppl (ACCU-CHEK AVIVA PLUS) W/DEVICE KIT, Use as directed., Disp: 1  kit, Rfl: 0;  calcium-vitamin D (OSCAL 500/200 D-3) 500-200 MG-UNIT per tablet, Take 1 tablet by mouth daily., Disp: , Rfl: ;  cloNIDine (CATAPRES) 0.1 MG tablet, Take 0.1 mg by mouth 2 (two) times daily., Disp: , Rfl:  ferrous sulfate 325 (65 FE) MG tablet, Take 325 mg by mouth daily with breakfast., Disp: , Rfl: ;  glucose blood (ACCU-CHEK AVIVA PLUS) test strip, Use as instructed, Disp: 100 each, Rfl: 12;  HYDROcodone-acetaminophen (NORCO/VICODIN) 5-325 MG per tablet, Take 1-2 tablets by mouth every 6 (six) hours as needed., Disp: 10 tablet, Rfl: 0;  LANTUS SOLOSTAR 100 UNIT/ML SOPN, Inject 23 Units into the skin daily. , Disp: , Rfl:  levothyroxine (SYNTHROID, LEVOTHROID) 125 MCG tablet, Take 125 mcg by mouth daily before breakfast., Disp: , Rfl: ;  metformin (FORTAMET) 1000 MG (OSM) 24 hr tablet, Take 1 tablet (1,000 mg total) by mouth 2 (two) times daily with a meal., Disp: 60 tablet, Rfl: 6;  metoprolol (LOPRESSOR) 50 MG tablet, Take 50 mg by mouth daily., Disp: , Rfl:  NOVOLOG FLEXPEN 100 UNIT/ML SOPN FlexPen, Inject 3 Units into the skin 3 (three) times daily with meals. , Disp: , Rfl: ;  Olmesartan-Amlodipine-HCTZ (TRIBENZOR) 40-10-25 MG TABS, Take 1 tablet by mouth every morning., Disp: 90 tablet, Rfl: 0;  ONE TOUCH ULTRA TEST test strip, , Disp: , Rfl: ;  ONETOUCH DELICA LANCETS FINE MISC, , Disp: , Rfl: ;  polyethylene glycol (MIRALAX / GLYCOLAX) packet, Take 17 g by mouth daily., Disp: , Rfl:  Sennosides-Docusate Sodium (STOOL SOFTENER & LAXATIVE PO), Take 1 tablet by mouth daily as needed (constipation). , Disp: , Rfl: ;  simvastatin (ZOCOR) 20 MG tablet, Take 20 mg by mouth at bedtime. , Disp: , Rfl: ;  venlafaxine (EFFEXOR) 37.5 MG tablet, Take 1 tablet (37.5 mg total) by mouth 2 (two) times daily., Disp: 60 tablet, Rfl: 0;  vitamin B-12 (CYANOCOBALAMIN) 1000 MCG tablet, Take 1,000 mcg by mouth daily., Disp: , Rfl:   EXAM:  Filed Vitals:   02/02/14 1134  BP: 120/60  Temp: 97.9 F (36.6  C)    Body mass index is 47.97 kg/(m^2).  GENERAL: vitals reviewed and listed above, alert, oriented, appears well hydrated and in no acute distress  HEENT: atraumatic, conjunttiva clear, no obvious abnormalities on inspection of external nose and ears  NECK: no obvious masses on inspection  LUNGS: clear to auscultation bilaterally, no wheezes, rales or rhonchi, good air  movement  CV: HRRR, no peripheral edema  MS: moves all extremities without noticeable abnormality  PSYCH: pleasant and cooperative, no obvious depression or anxiety  ASSESSMENT AND PLAN:  Discussed the following assessment and plan:  Type II or unspecified type diabetes mellitus with peripheral circulatory disorders, uncontrolled(250.72)  Hyperlipemia  Essential hypertension, benign  -see diabetes educator to get a handle on checking postprandial BS to adjust mealtime insulin and for diet help (has appt next week) -lifesyte recs - she wants to talk with me longer about meal and snack options so advised to schedule 30 minute appt at follow up -increase basal insulin to 25 units q am and use 5 units of mealtime insulin with postprandial check and bring this log to appt (discussed usually mealtime units + basal units) -management of hypoglycemia and importance of regular small healthy meals and regular exercise discussed -hx of chronic back pain followed by her back doctor - advised to schedule follow up appt with her specialist -follow up in 1 month and labs then  -Patient advised to return or notify a doctor immediately if symptoms worsen or persist or new concerns arise.  Patient Instructions  DIABETES: -take lantus 25 units daily in the morning  -take 5 units of novolog with each meal  -check blood sugar and keep log: FASTING (first thing in the morning) POST-PRANDIAL) 2 hours after  WHEN you eat - take your novolog and set your alarm for 2 hours to remind you to check post-prandial  If any lows <70:  take a snack immediately and recheck in 15-30 minutes, call 911 if feeling very bad, let us know  Follow up in 1 month     Katie Mcintyre, [place]

## 2014-02-03 ENCOUNTER — Telehealth: Payer: Self-pay | Admitting: Family Medicine

## 2014-02-03 NOTE — Telephone Encounter (Signed)
Relevant patient education assigned to patient using Emmi. ° °

## 2014-02-05 ENCOUNTER — Telehealth: Payer: Self-pay

## 2014-02-05 NOTE — Telephone Encounter (Signed)
Relevant patient education assigned to patient using Emmi. ° °

## 2014-02-07 ENCOUNTER — Other Ambulatory Visit: Payer: Self-pay | Admitting: Family Medicine

## 2014-02-09 ENCOUNTER — Telehealth: Payer: Self-pay | Admitting: Family Medicine

## 2014-02-09 MED ORDER — METFORMIN HCL 1000 MG PO TABS: 1000.0000 mg | ORAL_TABLET | Freq: Two times a day (BID) | ORAL | Status: DC

## 2014-02-09 NOTE — Telephone Encounter (Signed)
Pt is calling in regards to rx metformin (FORTAMET) 1000 MG (OSM) 24 hr tablet, pt states her insurance will no longer pay for the rx and wants to know if dr. Maudie Mercury was able to appeal.

## 2014-02-09 NOTE — Telephone Encounter (Signed)
Called and spoke with pt and pt would like to switch and have the rx sent to [facility].  Rx sent .

## 2014-02-09 NOTE — Telephone Encounter (Signed)
They prob want her to go to the regular metformin which is fine since she take twice daily anyways and should be much cheaper. Rx regular metformin 1000mg  bid. See where she wants the rx to be sent. Thanks.

## 2014-02-11 ENCOUNTER — Ambulatory Visit: Payer: Medicare Other | Admitting: *Deleted

## 2014-02-16 ENCOUNTER — Other Ambulatory Visit: Payer: Self-pay | Admitting: Family Medicine

## 2014-02-23 ENCOUNTER — Other Ambulatory Visit: Payer: Self-pay | Admitting: Family Medicine

## 2014-02-27 ENCOUNTER — Other Ambulatory Visit: Payer: Self-pay | Admitting: Family Medicine

## 2014-03-01 ENCOUNTER — Other Ambulatory Visit: Payer: Self-pay | Admitting: Family Medicine

## 2014-03-05 ENCOUNTER — Ambulatory Visit: Payer: Medicare PPO | Admitting: Family Medicine

## 2014-03-12 ENCOUNTER — Encounter: Payer: Self-pay | Admitting: Family Medicine

## 2014-03-12 ENCOUNTER — Ambulatory Visit (INDEPENDENT_AMBULATORY_CARE_PROVIDER_SITE_OTHER): Payer: Medicare PPO | Admitting: Family Medicine

## 2014-03-12 VITALS — BP 120/60 | HR 71 | Temp 97.8°F | Wt 258.0 lb

## 2014-03-12 DIAGNOSIS — E1159 Type 2 diabetes mellitus with other circulatory complications: Secondary | ICD-10-CM

## 2014-03-12 DIAGNOSIS — J069 Acute upper respiratory infection, unspecified: Secondary | ICD-10-CM

## 2014-03-12 DIAGNOSIS — M549 Dorsalgia, unspecified: Secondary | ICD-10-CM

## 2014-03-12 DIAGNOSIS — E785 Hyperlipidemia, unspecified: Secondary | ICD-10-CM

## 2014-03-12 DIAGNOSIS — I10 Essential (primary) hypertension: Secondary | ICD-10-CM

## 2014-03-12 DIAGNOSIS — G8929 Other chronic pain: Secondary | ICD-10-CM | POA: Insufficient documentation

## 2014-03-12 LAB — BASIC METABOLIC PANEL
BUN: 21 mg/dL (ref 6–23)
CALCIUM: 9.5 mg/dL (ref 8.4–10.5)
CO2: 27 mEq/L (ref 19–32)
Chloride: 100 mEq/L (ref 96–112)
Creatinine, Ser: 0.8 mg/dL (ref 0.4–1.2)
GFR: 91.5 mL/min (ref >60.00)
GLUCOSE: 73 mg/dL (ref 70–99)
Potassium: 4 mEq/L (ref 3.5–5.1)
Sodium: 140 mEq/L (ref 135–145)

## 2014-03-12 LAB — MICROALBUMIN / CREATININE URINE RATIO
CREATININE, U: 104.7 mg/dL
Microalb Creat Ratio: 12.5 mg/g (ref 0.0–30.0)
Microalb, Ur: 13.1 mg/dL — ABNORMAL HIGH (ref 0.0–1.9)

## 2014-03-12 LAB — LIPID PANEL
Cholesterol: 132 mg/dL (ref 0–200)
HDL: 51.9 mg/dL (ref >39.00)
LDL CALC: 54 mg/dL (ref 0–99)
Total CHOL/HDL Ratio: 3
Triglycerides: 131 mg/dL (ref 0.0–149.0)
VLDL: 26.2 mg/dL (ref 0.0–40.0)

## 2014-03-12 LAB — HEMOGLOBIN A1C: Hgb A1c MFr Bld: 7.6 % — ABNORMAL HIGH (ref 4.6–6.5)

## 2014-03-12 MED ORDER — BENZONATATE 100 MG PO CAPS: 100.0000 mg | ORAL_CAPSULE | Freq: Two times a day (BID) | ORAL | Status: DC | PRN

## 2014-03-12 MED ORDER — ALBUTEROL SULFATE HFA 108 (90 BASE) MCG/ACT IN AERS: 2.0000 | INHALATION_SPRAY | Freq: Four times a day (QID) | RESPIRATORY_TRACT | Status: DC | PRN

## 2014-03-12 NOTE — Patient Instructions (Signed)
-  We have ordered labs or studies at this visit. It can take up to 1-2 weeks for results and processing. We will contact you with instructions IF your results are abnormal. Normal results will be released to your [facility]. If you have not heard from [place] or can not find your results in [facility] in 2 weeks please contact our office.  -cough medication and albuterol sent to pharmacy for your cough - if worsening or concerns please call or see a doctor soon  -see the diabetes educator  -check blood sugar and keep log:  FASTING (first thing in the morning)  POST-PRANDIAL) 2 hours after  WHEN you eat - take your novolog and set your alarm for 2 hours to remind you to check post-prandial  If any lows <70: take a snack immediately and recheck in 15-30 minutes, call 911 if feeling very bad, let us know  -follow up in 3 months or sooner if needed

## 2014-03-12 NOTE — Progress Notes (Signed)
Chief Complaint  Patient presents with  . 1 month follow up  . Cough    wheezing x 2 night     HPI:  Follow up DM: -has not followed recommendations in the past -last visit advised to see diabetes educator, increase lantus to 25 units daily, mealtime to 5 units with each meal and keep log to adjust mealtime/basal insulin; fasting mostly under 130 and only one low in the 50s, She did not do any postprandial blood sugars. -she reports:did not see diabetes educator due to scheduling issue -last eye exam:this morning -diet and exercise: eats three meals per day and snack but not similar day to day,  -meds: lantus 25 in the morning, novolog 5 before meal, metformin, asa, zocor, tribenzor   Cough: -started 2 nights ago -symptoms: nasal congestion, productive cough, wheezing, itching throat -denies: fevers, SOB, NVD -tried: nothing -no flu or strep exposure, no ebola risks  ROS: See pertinent positives and negatives per HPI.  Past Medical History  Diagnosis Date  . Hypertension   . Hyperlipidemia   . Diabetes mellitus without complication   . Thyroid disease   . Gout 06/30/2013  . Depression 06/30/2013  . GERD (gastroesophageal reflux disease)   . Spinal stenosis of lumbar region     s/p spine surgery 02/12/13 with Dr Marcos [facility]ke at [facility]  . Anemia   . Arthritis   . Cancer     cervical  . Chicken pox   . Diverticulitis   . Chronic bronchitis   . Frequent headaches   . Colon polyp   . Urine incontinence     Past Surgical History  Procedure Laterality Date  . Back surgery  01/2013  . Cervical cone biopsy    . Abdominal hysterectomy  1970    complete hysterectomy for cervical cancer  . Back surgery  1995  . Wrist surgery  2010    for wrist fracture    Family History  Problem Relation Age of Onset  . Alcohol abuse Father   . Alcohol abuse Brother   . Arthritis      parents  . Hyperlipidemia      parent  . Stroke      grandparent  . Hypertension     parent/grandparent  . Diabetes      parent/grandparent  . Breast cancer Sister   . Heart disease Other   . Mental illness Other   . Colon cancer Neg Hx   . Esophageal cancer Neg Hx   . Prostate cancer Neg Hx   . Rectal cancer Neg Hx     History   Social History  . Marital Status: Divorced    Spouse Name: N/A    Number of Children: N/A  . Years of Education: N/A   Social History Main Topics  . Smoking status: Former Smoker    Quit date: 12/25/1964  . Smokeless tobacco: Never Used  . Alcohol Use: Yes     Comment: occ with steak dinner   . Drug Use: No  . Sexual Activity: None   Other Topics Concern  . 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 [place]: exercising on regular visit; trying to work on diet             Current outpatient prescriptions:ACCU-CHEK SOFTCLIX LANCETS lancets, Use as instructed,  Disp: 100 each, Rfl: 12;  acetaminophen (TYLENOL) 500 MG tablet, Take 500 mg by mouth every 8 (eight) hours., Disp: , Rfl: ;  albuterol (PROAIR HFA) 108 (90 BASE) MCG/ACT inhaler, Inhale 2 puffs into the lungs every 6 (six) hours as needed for wheezing or shortness of breath., Disp: 1 Inhaler, Rfl: 0 Alcohol Swabs (B-D SINGLE USE SWABS REGULAR) PADS, Use as directed., Disp: 100 each, Rfl: 5;  allopurinol (ZYLOPRIM) 300 MG tablet, Take 300 mg by mouth daily., Disp: , Rfl: ;  aspirin 81 MG tablet, Take 81 mg by mouth daily., Disp: , Rfl: ;  B-D UF III MINI PEN NEEDLES 31G X 5 MM MISC, , Disp: , Rfl: ;  BIDIL 20-37.5 MG per tablet, Take 1 tablet by mouth 2 (two) times daily. , Disp: , Rfl:  Blood Glucose Calibration (ACCU-CHEK AVIVA) SOLN, Use as directed., Disp: 1 each, Rfl: 1;  Blood Glucose Monitoring Suppl (ACCU-CHEK AVIVA PLUS) W/DEVICE KIT, Use as directed., Disp: 1 kit, Rfl: 0;  calcium-vitamin D (OSCAL 500/200 D-3) 500-200 MG-UNIT per tablet,  Take 1 tablet by mouth daily., Disp: , Rfl: ;  cloNIDine (CATAPRES) 0.1 MG tablet, Take 0.1 mg by mouth 2 (two) times daily., Disp: , Rfl:  ferrous sulfate 325 (65 FE) MG tablet, Take 325 mg by mouth daily with breakfast., Disp: , Rfl: ;  glucose blood (ACCU-CHEK AVIVA PLUS) test strip, Use as instructed, Disp: 100 each, Rfl: 12;  HYDROcodone-acetaminophen (NORCO/VICODIN) 5-325 MG per tablet, Take 1-2 tablets by mouth every 6 (six) hours as needed., Disp: 10 tablet, Rfl: 0;  LANTUS SOLOSTAR 100 UNIT/ML Solostar Pen, INJECT 40 UNITS AT BEDTIME, Disp: 15 pen, Rfl: 3 levothyroxine (SYNTHROID, LEVOTHROID) 125 MCG tablet, Take 125 mcg by mouth daily before breakfast., Disp: , Rfl: ;  metFORMIN (GLUCOPHAGE) 1000 MG tablet, Take 1 tablet (1,000 mg total) by mouth 2 (two) times daily with a meal., Disp: 180 tablet, Rfl: 1;  metoprolol (LOPRESSOR) 50 MG tablet, TAKE 1 TABLET BY MOUTH ONCE DAILY., Disp: 30 tablet, Rfl: 5;  metoprolol (LOPRESSOR) 50 MG tablet, TAKE 1 TABLET BY MOUTH ONCE DAILY., Disp: 30 tablet, Rfl: 5 NOVOLOG FLEXPEN 100 UNIT/ML SOPN FlexPen, Inject 3 Units into the skin 3 (three) times daily with meals. , Disp: , Rfl: ;  ONE TOUCH ULTRA TEST test strip, , Disp: , Rfl: ;  ONETOUCH DELICA LANCETS FINE MISC, , Disp: , Rfl: ;  polyethylene glycol (MIRALAX / GLYCOLAX) packet, Take 17 g by mouth daily., Disp: , Rfl: ;  Sennosides-Docusate Sodium (STOOL SOFTENER & LAXATIVE PO), Take 1 tablet by mouth daily as needed (constipation). , Disp: , Rfl:  simvastatin (ZOCOR) 20 MG tablet, TAKE 1 TABLET BY MOUTH EVERY DAY, Disp: 90 tablet, Rfl: 3;  TRIBENZOR 40-10-25 MG TABS, TAKE 1 TABLET BY MOUTH EVERY MORNING., Disp: 90 tablet, Rfl: 0;  venlafaxine (EFFEXOR) 37.5 MG tablet, TAKE 1 TABLET (37.5 MG TOTAL) BY MOUTH 2 (TWO) TIMES DAILY., Disp: 60 tablet, Rfl: 0;  vitamin B-12 (CYANOCOBALAMIN) 1000 MCG tablet, Take 1,000 mcg by mouth daily., Disp: , Rfl:  benzonatate (TESSALON) 100 MG capsule, Take 1 capsule (100 mg  total) by mouth 2 (two) times daily as needed for cough., Disp: 20 capsule, Rfl: 0;  ofloxacin (OCUFLOX) 0.3 % ophthalmic solution, , Disp: , Rfl: ;  prednisoLONE acetate (PRED FORTE) 1 % ophthalmic suspension, , Disp: , Rfl:   EXAM:  Filed Vitals:   03/12/14 1316  BP: 120/60  Pulse: 71  Temp: 97.8 F (36.6 C)  Body mass index is 47.97 kg/(m^2).  GENERAL: vitals reviewed and listed above, alert, oriented, appears well hydrated and in no acute distress  HEENT: atraumatic, conjunttiva clear, no obvious abnormalities on inspection of external nose and ears, normal appearance of ear canals and TMs, clear nasal congestion, mild post oropharyngeal erythema with PND, no tonsillar edema or exudate, no sinus TTP  NECK: no obvious masses on inspection  LUNGS: clear to auscultation bilaterally, no wheezes, rales or rhonchi, good air movement  CV: HRRR, no peripheral edema  MS: moves all extremities without noticeable abnormality  PSYCH: pleasant and cooperative, no obvious depression or anxiety  ASSESSMENT AND PLAN:  Discussed the following assessment and plan:  Chronic back pain - managed by Dr. Marcos Eke at [facility]  Hyperlipemia - Plan: Lipid Panel, Microalbumin / creatinine urine ratio  Type II or unspecified type diabetes mellitus with peripheral circulatory disorders, uncontrolled(250.72) - Plan: Hemoglobin A1c  Essential hypertension, benign - Plan: Basic metabolic panel  Upper respiratory infection - Plan: albuterol (PROAIR HFA) 108 (90 BASE) MCG/ACT inhaler, benzonatate (TESSALON) 100 MG capsule   -not checking sugars as advised, need to balanc mealtime/basal insulin - will wait on hgba1c, fasting BS good though had 1 low BS -has likely VURI - supportive care, return precuaitons -Patient advised to return or notify a doctor immediately if symptoms worsen or persist or new concerns arise.  Patient Instructions  -We have ordered labs or studies at this visit. It can take up  to 1-2 weeks for results and processing. We will contact you with instructions IF your results are abnormal. Normal results will be released to your [facility]. If you have not heard from [place] or can not find your results in [facility] in 2 weeks please contact our office.  -cough medication and albuterol sent to pharmacy for your cough - if worsening or concerns please call or see a doctor soon  -see the diabetes educator  -check blood sugar and keep log:  FASTING (first thing in the morning)  POST-PRANDIAL) 2 hours after  WHEN you eat - take your novolog and set your alarm for 2 hours to remind you to check post-prandial  If any lows <70: take a snack immediately and recheck in 15-30 minutes, call 911 if feeling very bad, let us know  -follow up in 3 months or sooner if needed      Katie Winkles R.

## 2014-03-12 NOTE — Progress Notes (Signed)
Pre visit review using our clinic review tool, if applicable. No additional management support is needed unless otherwise documented below in the visit note. 

## 2014-03-16 ENCOUNTER — Telehealth: Payer: Self-pay

## 2014-03-16 NOTE — Telephone Encounter (Signed)
Reiceved a fax from [place] for diabetic supplies.  Left a message for pt to return call.

## 2014-03-17 ENCOUNTER — Encounter: Payer: Medicare PPO | Attending: Family Medicine | Admitting: *Deleted

## 2014-03-17 VITALS — Ht 61.5 in | Wt 258.7 lb

## 2014-03-17 DIAGNOSIS — E119 Type 2 diabetes mellitus without complications: Secondary | ICD-10-CM | POA: Insufficient documentation

## 2014-03-17 DIAGNOSIS — Z713 Dietary counseling and surveillance: Secondary | ICD-10-CM | POA: Insufficient documentation

## 2014-03-17 DIAGNOSIS — E1159 Type 2 diabetes mellitus with other circulatory complications: Secondary | ICD-10-CM

## 2014-03-17 NOTE — Patient Instructions (Addendum)
Plan: Continue with 2 Carb Choices at each meal to provide more consistent blood sugars  Continue with your current activity level as tolerated Consider going on computer and looking for Arm Chair Exercise Videos, and do these on days you don't go to the gym Continue checking BG at alternate times per day including 2 hour post meal and after exercise as able  Consider taking less Novolog at meal before going to the gym to prevent low BG with your exercise Continue taking insulin as directed by MD

## 2014-03-17 NOTE — Progress Notes (Signed)
Appt start time: 1530 end time:  1600.  Assessment:  Patient was seen on  03/17/14 for individual diabetes education follow up visit. She has moved into her own apartment but it is still small so she still cannot spread out her sewing projects yet. She is planning to move into a new home once it is built. She has tried going to the gym but something always gets in the way, usually a medical set back. She had an operation on her eyes a couple of weeks ago.  She walks and gets on the [employer], she is not familiar with any other machines.  She states she  is testing her BG   Current HbA1c: 7.7% down from 7.9% 3 months ago  Preferred Learning Style:   No preference indicated   Learning Readiness:   Change in progress  MEDICATIONS: see list, diabetes medications include Lantus, Novolog and Metformin  DIETARY INTAKE:  24-hr recall:  B ( AM): 1 pkg flavored oatmeal with extra steel cut regular oatmeal, wheat germ, 1 tbsp raisins and cinnamon OR 2 eggs occasionally with toast OR  yogurt, coffee with Splenda and flavored Coffee Mate Snk ( AM): no, unless a fresh fruit  L ( PM): home made chicken or black bean soup and fresh fruit or yogurt, OR lean meat salad OR PNB sandwich with cheese sticks, Crystal Light with added fresh lemon  Snk ( PM): no D (6 PM): lean meat, starch, vegetable OR casserole with chicken, water or Crystal Light Snk (8 PM): no more home made GORP with peanuts, raisins and coconut mixed together OR handful of raisins OR fresh fruit Beverages: coffee, Crystal Light, hot tea with Splenda, water  Usual physical activity: is now going to [facility] under [employer] due to change in her insurance and she uses the [employer].  Estimated energy needs: 1400 calories 158 g carbohydrates 105 g protein 39  g fat    Intervention:  Commended her on her improved eating habits and her efforts to increase her activity level. Suggested some options for doing some exercises at home  when she can't get to the gym.  Plan: Continue with 2 Carb Choices at each meal to provide more consistent blood sugars  Continue with your current activity level as tolerated Consider going on computer and looking for Arm Chair Exercise Videos, and do these on days you don't go to the gym Continue checking BG at alternate times per day including 2 hour post meal and after exercise as able  Consider taking less Novolog at meal before going to the gym to prevent low BG with your exercise Continue taking insulin as directed by MD  Teaching Method Utilized: all of the following Visual Auditory Hands on  Handouts given during visit include: Arm Chair Exercise options  Barriers to learning/adherence to lifestyle change: recent move from [place], still getting settled, plus recent illnesses   Diabetes self-care support plan:   [facility] support group available  Demonstrated degree of understanding via:  Teach Back   Monitoring/Evaluation:  Dietary intake, exercise, SMBG, and body weight in 6 week(s).

## 2014-03-18 NOTE — Telephone Encounter (Signed)
Called and spoke with pt and pt states her new insurance .  Pt states she does not want the supplies since she just received supplies from her mail order company. Faxed back pt declined.

## 2014-03-24 NOTE — Progress Notes (Signed)
Also discussed potential opportunity of Intensive Insulin Management where she could be taught to adjust her Novolog insulin based Carb intake and BG reading to fine tune her insulin action better. Provided [employer] for [employer] per her request.

## 2014-03-31 ENCOUNTER — Telehealth: Payer: Self-pay

## 2014-03-31 ENCOUNTER — Other Ambulatory Visit: Payer: Self-pay | Admitting: Family Medicine

## 2014-03-31 NOTE — Telephone Encounter (Signed)
Attempted to call pt to get blood sugar readings for the past week or so.  Could not leave a message- voicemail full.

## 2014-04-02 ENCOUNTER — Other Ambulatory Visit: Payer: Self-pay | Admitting: Family Medicine

## 2014-04-02 MED ORDER — VENLAFAXINE HCL 37.5 MG PO TABS: ORAL_TABLET | ORAL | Status: DC

## 2014-04-02 NOTE — Telephone Encounter (Signed)
Called and pt would like to send the results of blood sugar via mychart.

## 2014-04-02 NOTE — Telephone Encounter (Signed)
Rx sent to pharmacy   

## 2014-04-03 ENCOUNTER — Encounter: Payer: Self-pay | Admitting: Family Medicine

## 2014-04-07 ENCOUNTER — Telehealth: Payer: Self-pay | Admitting: Family Medicine

## 2014-04-07 MED ORDER — BIDIL 20-37.5 MG PO TABS: 1.0000 | ORAL_TABLET | Freq: Two times a day (BID) | ORAL | Status: DC

## 2014-04-07 NOTE — Telephone Encounter (Signed)
Pt states she has called pharm several times for refill BIDIL 20-37.5 MG per tablet  Can you send this rx? Pt is out of med. [facility]acy[facility]

## 2014-04-07 NOTE — Telephone Encounter (Signed)
Rx sent to pharmacy for 6 month supply.  

## 2014-04-22 ENCOUNTER — Ambulatory Visit: Payer: Medicare PPO | Admitting: *Deleted

## 2014-06-04 ENCOUNTER — Other Ambulatory Visit: Payer: Self-pay | Admitting: Family Medicine

## 2014-07-01 ENCOUNTER — Telehealth: Payer: Self-pay | Admitting: Family Medicine

## 2014-07-01 NOTE — Telephone Encounter (Signed)
Ok to send rx for lancets and one touch

## 2014-07-01 NOTE — Telephone Encounter (Signed)
I called the pt to see if she needed a new glucometer as well and was unable to leave a message her voicemail box is full.

## 2014-07-01 NOTE — Telephone Encounter (Signed)
Pt would like a ONE TOUCH ULTRA with needles.  The needle on her Accu-chek Aviva is broken and she doesn't like the needle used to prick her finger.

## 2014-07-02 ENCOUNTER — Telehealth: Payer: Self-pay | Admitting: Family Medicine

## 2014-07-02 DIAGNOSIS — E1159 Type 2 diabetes mellitus with other circulatory complications: Secondary | ICD-10-CM

## 2014-07-02 MED ORDER — ACCU-CHEK AVIVA PLUS W/DEVICE KIT: 1.0000 [IU] | PACK | Freq: Once | Status: DC

## 2014-07-02 MED ORDER — ONETOUCH ULTRA SYSTEM W/DEVICE KIT: 1.0000 | PACK | Freq: Once | Status: DC

## 2014-07-02 MED ORDER — GLUCOSE BLOOD VI STRP: ORAL_STRIP | Status: DC

## 2014-07-02 MED ORDER — ACCU-CHEK SOFTCLIX LANCETS MISC: Status: DC

## 2014-07-02 NOTE — Telephone Encounter (Signed)
Pt call to say that her insurance will not pay for Blood Glucose Monitoring Suppl (ONE TOUCH ULTRA SYSTEM KIT) W/DEVICE KIT  But it will pay for the acuvue aviva so pt need this one called in to [facility]

## 2014-07-02 NOTE — Telephone Encounter (Signed)
Patient requested new unit and test strips and she is aware this was sent to [facility].

## 2014-07-02 NOTE — Telephone Encounter (Signed)
This was sent to the pts pharmacy.

## 2014-07-26 ENCOUNTER — Other Ambulatory Visit: Payer: Self-pay | Admitting: Family Medicine

## 2014-07-28 ENCOUNTER — Telehealth: Payer: Self-pay | Admitting: Family Medicine

## 2014-07-28 DIAGNOSIS — E1159 Type 2 diabetes mellitus with other circulatory complications: Secondary | ICD-10-CM

## 2014-07-28 MED ORDER — GLUCOSE BLOOD VI STRP: ORAL_STRIP | Status: DC

## 2014-07-28 NOTE — Telephone Encounter (Signed)
Pt is needing new rx glucose blood (ACCU-CHEK AVIVA PLUS) test strip, sent to [facility].

## 2014-07-28 NOTE — Telephone Encounter (Signed)
Rx sent to the pts pharmacy.

## 2014-08-03 ENCOUNTER — Other Ambulatory Visit: Payer: Self-pay | Admitting: Family Medicine

## 2014-08-03 NOTE — Telephone Encounter (Signed)
Call in Synthroid #30 and syringes #100 with no rf

## 2014-08-10 ENCOUNTER — Other Ambulatory Visit: Payer: Self-pay | Admitting: Family Medicine

## 2014-08-11 ENCOUNTER — Other Ambulatory Visit: Payer: Self-pay | Admitting: Family Medicine

## 2014-08-11 MED ORDER — METFORMIN HCL 1000 MG PO TABS: 1000.0000 mg | ORAL_TABLET | Freq: Two times a day (BID) | ORAL | Status: DC

## 2014-08-11 NOTE — Addendum Note (Signed)
Addended by: Agnes Lawrence on: 08/11/2014 09:07 AM   Modules accepted: Orders

## 2014-08-26 ENCOUNTER — Ambulatory Visit (INDEPENDENT_AMBULATORY_CARE_PROVIDER_SITE_OTHER): Payer: Medicare PPO | Admitting: Family Medicine

## 2014-08-26 ENCOUNTER — Encounter: Payer: Self-pay | Admitting: Family Medicine

## 2014-08-26 ENCOUNTER — Ambulatory Visit (HOSPITAL_COMMUNITY): Payer: Medicare PPO | Attending: Internal Medicine | Admitting: Cardiology

## 2014-08-26 ENCOUNTER — Encounter (HOSPITAL_COMMUNITY): Payer: Medicare PPO

## 2014-08-26 ENCOUNTER — Telehealth: Payer: Self-pay | Admitting: Family Medicine

## 2014-08-26 VITALS — BP 118/70 | HR 71 | Temp 98.6°F | Ht 61.5 in | Wt 257.0 lb

## 2014-08-26 DIAGNOSIS — M7989 Other specified soft tissue disorders: Secondary | ICD-10-CM

## 2014-08-26 DIAGNOSIS — M79609 Pain in unspecified limb: Secondary | ICD-10-CM

## 2014-08-26 DIAGNOSIS — M79661 Pain in right lower leg: Secondary | ICD-10-CM

## 2014-08-26 DIAGNOSIS — E785 Hyperlipidemia, unspecified: Secondary | ICD-10-CM | POA: Insufficient documentation

## 2014-08-26 DIAGNOSIS — I10 Essential (primary) hypertension: Secondary | ICD-10-CM | POA: Diagnosis not present

## 2014-08-26 DIAGNOSIS — E1159 Type 2 diabetes mellitus with other circulatory complications: Secondary | ICD-10-CM

## 2014-08-26 DIAGNOSIS — Z87891 Personal history of nicotine dependence: Secondary | ICD-10-CM | POA: Diagnosis not present

## 2014-08-26 DIAGNOSIS — E119 Type 2 diabetes mellitus without complications: Secondary | ICD-10-CM | POA: Diagnosis not present

## 2014-08-26 MED ORDER — DOXYCYCLINE HYCLATE 100 MG PO TABS: 100.0000 mg | ORAL_TABLET | Freq: Two times a day (BID) | ORAL | Status: DC

## 2014-08-26 MED ORDER — CEFTRIAXONE SODIUM 1 G IJ SOLR
1.0000 g | Freq: Once | INTRAMUSCULAR | Status: AC
  Administered 2014-08-26: 1 g via INTRAMUSCULAR

## 2014-08-26 NOTE — Progress Notes (Signed)
Pre visit review using our clinic review tool, if applicable. No additional management support is needed unless otherwise documented below in the visit note. 

## 2014-08-26 NOTE — Telephone Encounter (Signed)
I checked on the report for lower extremity venous doppler, and it was negative for any blood clots. She should take her antibiotic as directed and follow up with Dr. Maudie Mercury

## 2014-08-26 NOTE — Telephone Encounter (Signed)
I spoke with pt and went over the below information.

## 2014-08-26 NOTE — Progress Notes (Signed)
Right lower venous duplex performed  

## 2014-08-26 NOTE — Patient Instructions (Signed)
-  go get the US of the leg  -take the medication as prescribed  -schedule follow up tomorrow  -seek care immediatly if worsening, other new symptoms, trouble breathing, fevers

## 2014-08-26 NOTE — Progress Notes (Addendum)
No chief complaint on file.   HPI:  Acute visit for:  1) Leg Swelling: -started 2 days ago, something may have bit her but not sure -R lower leg red spot and swelling, tenderness, chills last night but no fever, a little malaise yesterday -denies: SOB, DOE, immobility, recent surgery -Wells: 0 -BS: ok per her report 70-130s fasting  Note: history of poor compliance with treatement and follow up recommendations.  ROS: See pertinent positives and negatives per HPI.  Past Medical History  Diagnosis Date  . Hypertension   . Hyperlipidemia   . Diabetes mellitus without complication   . Thyroid disease   . Gout 06/30/2013  . Depression 06/30/2013  . GERD (gastroesophageal reflux disease)   . Spinal stenosis of lumbar region     s/p spine surgery 02/12/13 with Dr Marcos Eke at [facility]  . Anemia   . Arthritis   . Cancer     cervical  . Chicken pox   . Diverticulitis   . Chronic bronchitis   . Frequent headaches   . Colon polyp   . Urine incontinence     Past Surgical History  Procedure Laterality Date  . Back surgery  01/2013  . Cervical cone biopsy    . Abdominal hysterectomy  1970    complete hysterectomy for cervical cancer  . Back surgery  1995  . Wrist surgery  2010    for wrist fracture    Family History  Problem Relation Age of Onset  . Alcohol abuse Father   . Alcohol abuse Brother   . Arthritis      parents  . Hyperlipidemia      parent  . Stroke      grandparent  . Hypertension      parent/grandparent  . Diabetes      parent/grandparent  . Breast cancer Sister   . Heart disease Other   . Mental illness Other   . Colon cancer Neg Hx   . Esophageal cancer Neg Hx   . Prostate cancer Neg Hx   . Rectal cancer Neg Hx     History   Social History  . Marital Status: Divorced    Spouse Name: N/A    Number of Children: N/A  . Years of Education: N/A   Social History Main Topics  . Smoking status: Former Smoker    Quit date: 12/25/1964  . Smokeless  tobacco: Never Used  . Alcohol Use: Yes     Comment: occ with steak dinner   . Drug Use: No  . Sexual Activity: None   Other Topics Concern  . 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 [place]: exercising on regular visit; trying to work on diet             Current outpatient prescriptions:ACCU-CHEK SOFTCLIX LANCETS lancets, Use as instructed, Disp: 100 each, Rfl: 3;  acetaminophen (TYLENOL) 500 MG tablet, Take 500 mg by mouth every 8 (eight) hours., Disp: , Rfl: ;  allopurinol (ZYLOPRIM) 300 MG tablet, Take 300 mg by mouth daily., Disp: , Rfl: ;  aspirin 81 MG tablet, Take 81 mg by mouth daily., Disp: , Rfl: ;  B-D UF III MINI PEN NEEDLES 31G X 5 MM MISC, USE AS DIRECTED, Disp: 100 each, Rfl: 0 BIDIL 20-37.5 MG per tablet, Take 1 tablet  by mouth 2 (two) times daily., Disp: 180 tablet, Rfl: 1;  Blood Glucose Calibration (ACCU-CHEK AVIVA) SOLN, Use as directed., Disp: 1 each, Rfl: 1;  Blood Glucose Monitoring Suppl (ACCU-CHEK AVIVA PLUS) W/DEVICE KIT, 1 Units by Does not apply route once., Disp: 1 kit, Rfl: 0;  calcium-vitamin D (OSCAL 500/200 D-3) 500-200 MG-UNIT per tablet, Take 1 tablet by mouth daily., Disp: , Rfl:  cloNIDine (CATAPRES) 0.1 MG tablet, Take 0.1 mg by mouth 2 (two) times daily., Disp: , Rfl: ;  ferrous sulfate 325 (65 FE) MG tablet, Take 325 mg by mouth daily with breakfast., Disp: , Rfl: ;  glucose blood (ACCU-CHEK AVIVA PLUS) test strip, Use as instructed, Disp: 100 each, Rfl: 3;  LANTUS SOLOSTAR 100 UNIT/ML Solostar Pen, INJECT 40 UNITS AT BEDTIME, Disp: 15 pen, Rfl: 3 levothyroxine (SYNTHROID, LEVOTHROID) 125 MCG tablet, Take 125 mcg by mouth daily before breakfast., Disp: , Rfl: ;  metFORMIN (GLUCOPHAGE) 1000 MG tablet, TAKE 1 TABLET (1,000 MG TOTAL) BY MOUTH 2 (TWO) TIMES DAILY WITH A MEAL., Disp: 180 tablet, Rfl: 0;  metoprolol (LOPRESSOR)  50 MG tablet, TAKE 1 TABLET BY MOUTH ONCE DAILY., Disp: 30 tablet, Rfl: 5 NOVOLOG FLEXPEN 100 UNIT/ML FlexPen, USE AS DIRECTED. BEFORE A MEALS 5-10 UNITS, Disp: 1 pen, Rfl: 1;  prednisoLONE acetate (PRED FORTE) 1 % ophthalmic suspension, , Disp: , Rfl: ;  Sennosides-Docusate Sodium (STOOL SOFTENER & LAXATIVE PO), Take 1 tablet by mouth daily as needed (constipation). , Disp: , Rfl: ;  simvastatin (ZOCOR) 20 MG tablet, TAKE 1 TABLET BY MOUTH EVERY DAY, Disp: 90 tablet, Rfl: 3 TRIBENZOR 40-10-25 MG TABS, TAKE 1 TABLET BY MOUTH EVERY MORNING., Disp: 90 tablet, Rfl: 0;  venlafaxine (EFFEXOR) 37.5 MG tablet, TAKE 1 TABLET (37.5 MG TOTAL) BY MOUTH 2 (TWO) TIMES DAILY., Disp: 60 tablet, Rfl: 5;  vitamin B-12 (CYANOCOBALAMIN) 1000 MCG tablet, Take 1,000 mcg by mouth daily., Disp: , Rfl: ;  doxycycline (VIBRA-TABS) 100 MG tablet, Take 1 tablet (100 mg total) by mouth 2 (two) times daily., Disp: 20 tablet, Rfl: 0  EXAM:  Filed Vitals:   08/26/14 1030  BP: 118/70  Pulse: 71  Temp: 98.6 F (37 C)    Body mass index is 47.78 kg/(m^2).  GENERAL: vitals reviewed and listed above, alert, oriented, appears well hydrated and in no acute distress  HEENT: atraumatic, conjunttiva clear, no obvious abnormalities on inspection of external nose and ears  NECK: no obvious masses on inspection  LUNGS: clear to auscultation bilaterally, no wheezes, rales or rhonchi, good air movement  CV: HRRR, bilat LE edema to knees, R>L  SKIN: partch or erythematous skin on R LE with induration and swelling and warmth  MS: moves all extremities without noticeable abnormality  PSYCH: pleasant and cooperative, no obvious depression or anxiety  ASSESSMENT AND PLAN:  Discussed the following assessment and plan:  1. Type II or unspecified type diabetes mellitus with peripheral circulatory disorders, [id number](436.06) - Basic metabolic panel - Microalbumin/Creatinine Ratio, Urine - Hemoglobin A1C - Lipid Panel  2.  Pain and swelling of right lower leg - Lower Extremity Venous Duplex Right; Future - CBC with Differential - doxycycline (VIBRA-TABS) 100 MG tablet; Take 1 tablet (100 mg total) by mouth 2 (two) times daily.  Dispense: 20 tablet -likely cellulitis, no fever and feels well today, 1 g rocephin, doxy -LE venous duplex stat to r/o DVT - less likely, discussed tx options if +, she opted for xarelto if needed -elevation of leg, follow up 24-48  hours or sooner if needed, Emergency precautions -discussed case with Dr Sarajane Jews as will be out of the office incase [place] comes back positive -follow up chronic disease in the next 1 month - morning appt, obtain labs then -Patient advised to return or notify a doctor immediately if symptoms worsen or persist or new concerns arise.  Patient Instructions  -go get the US of the leg  -take the medication as prescribed  -schedule follow up tomorrow  -seek care immediatly if worsening, other new symptoms, trouble breathing, fevers     Athan Casalino R.

## 2014-08-27 ENCOUNTER — Ambulatory Visit: Payer: Medicare PPO | Admitting: Family Medicine

## 2014-08-27 ENCOUNTER — Ambulatory Visit (INDEPENDENT_AMBULATORY_CARE_PROVIDER_SITE_OTHER): Payer: Medicare PPO | Admitting: Family Medicine

## 2014-08-27 ENCOUNTER — Encounter: Payer: Self-pay | Admitting: Family Medicine

## 2014-08-27 VITALS — BP 136/64 | HR 85 | Temp 98.3°F | Ht 61.5 in | Wt 256.0 lb

## 2014-08-27 DIAGNOSIS — D509 Iron deficiency anemia, unspecified: Secondary | ICD-10-CM

## 2014-08-27 DIAGNOSIS — I10 Essential (primary) hypertension: Secondary | ICD-10-CM

## 2014-08-27 DIAGNOSIS — E039 Hypothyroidism, unspecified: Secondary | ICD-10-CM

## 2014-08-27 DIAGNOSIS — L03116 Cellulitis of left lower limb: Secondary | ICD-10-CM

## 2014-08-27 DIAGNOSIS — E785 Hyperlipidemia, unspecified: Secondary | ICD-10-CM

## 2014-08-27 DIAGNOSIS — E1159 Type 2 diabetes mellitus with other circulatory complications: Secondary | ICD-10-CM

## 2014-08-27 DIAGNOSIS — L03119 Cellulitis of unspecified part of limb: Secondary | ICD-10-CM

## 2014-08-27 DIAGNOSIS — L02419 Cutaneous abscess of limb, unspecified: Secondary | ICD-10-CM

## 2014-08-27 NOTE — Progress Notes (Signed)
Pre visit review using our clinic review tool, if applicable. No additional management support is needed unless otherwise documented below in the visit note. 

## 2014-08-27 NOTE — Patient Instructions (Signed)
-  elevated leg above waist for 30 minutes twice daily  -take antibioitc twice daily until finished  -followup next week to recheck leg and go over your labs  -schedule diabetic eye exam  -get labs today as you leave

## 2014-08-27 NOTE — Progress Notes (Signed)
No chief complaint on file.   HPI:  Follow up:  1)Cellulitis: -R LE -[place] neg for DVT -started abx yesterday (rocephin 1 g) and doxy -reports: has not elevated leg, feels better, no chills, no fever -denies: malaise, fever, chills, spreading  DM/HLD, HTN -poor compliance with care and follow up recs -meds:bidil, clonidine, metformin, metoprolol, novolog, lantus, tribenzor -did not get labs yesterday -denies: polyuria, polydipsia, CP, SOB -she ate cereal this morning so does not want to check cholesterol -last eye exa, cancelled last week  -no low blood sugars, reports fasting BS in low 100s  Hypothyroid: -stable on synthroid  Anemia: Stable on iron therapy Had colonoscopy with polpys, repeat advised in 2017 Denies melena, heamtochezia  ROS: See pertinent positives and negatives per HPI.  Past Medical History  Diagnosis Date  . Hypertension   . Hyperlipidemia   . Diabetes mellitus without complication   . Thyroid disease   . Gout 06/30/2013  . Depression 06/30/2013  . GERD (gastroesophageal reflux disease)   . Spinal stenosis of lumbar region     s/p spine surgery 02/12/13 with Dr Marcos Eke at [facility]  . Anemia   . Arthritis   . Cancer     cervical  . Chicken pox   . Diverticulitis   . Chronic bronchitis   . Frequent headaches   . Colon polyp   . Urine incontinence     Past Surgical History  Procedure Laterality Date  . Back surgery  01/2013  . Cervical cone biopsy    . Abdominal hysterectomy  1970    complete hysterectomy for cervical cancer  . Back surgery  1995  . Wrist surgery  2010    for wrist fracture    Family History  Problem Relation Age of Onset  . Alcohol abuse Father   . Alcohol abuse Brother   . Arthritis      parents  . Hyperlipidemia      parent  . Stroke      grandparent  . Hypertension      parent/grandparent  . Diabetes      parent/grandparent  . Breast cancer Sister   . Heart disease Other   . Mental illness Other   . Colon  cancer Neg Hx   . Esophageal cancer Neg Hx   . Prostate cancer Neg Hx   . Rectal cancer Neg Hx     History   Social History  . Marital Status: Divorced    Spouse Name: N/A    Number of Children: N/A  . Years of Education: N/A   Social History Main Topics  . Smoking status: Former Smoker    Quit date: 12/25/1964  . Smokeless tobacco: Never Used  . Alcohol Use: Yes     Comment: occ with steak dinner   . Drug Use: No  . Sexual Activity: None   Other Topics Concern  . 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 [place]: exercising on regular visit; trying to work on diet             Current outpatient prescriptions:ACCU-CHEK SOFTCLIX LANCETS lancets, Use as instructed, Disp: 100 each, Rfl: 3;  acetaminophen (TYLENOL) 500 MG tablet, Take 500 mg by mouth every 8 (eight) hours., Disp: , Rfl: ;  allopurinol (ZYLOPRIM) 300 MG tablet, Take 300 mg by  mouth daily., Disp: , Rfl: ;  aspirin 81 MG tablet, Take 81 mg by mouth daily., Disp: , Rfl: ;  B-D UF III MINI PEN NEEDLES 31G X 5 MM MISC, USE AS DIRECTED, Disp: 100 each, Rfl: 0 BIDIL 20-37.5 MG per tablet, Take 1 tablet by mouth 2 (two) times daily., Disp: 180 tablet, Rfl: 1;  Blood Glucose Calibration (ACCU-CHEK AVIVA) SOLN, Use as directed., Disp: 1 each, Rfl: 1;  Blood Glucose Monitoring Suppl (ACCU-CHEK AVIVA PLUS) W/DEVICE KIT, 1 Units by Does not apply route once., Disp: 1 kit, Rfl: 0;  calcium-vitamin D (OSCAL 500/200 D-3) 500-200 MG-UNIT per tablet, Take 1 tablet by mouth daily., Disp: , Rfl:  cloNIDine (CATAPRES) 0.1 MG tablet, Take 0.1 mg by mouth 2 (two) times daily., Disp: , Rfl: ;  doxycycline (VIBRA-TABS) 100 MG tablet, Take 1 tablet (100 mg total) by mouth 2 (two) times daily., Disp: 20 tablet, Rfl: 0;  ferrous sulfate 325 (65 FE) MG tablet, Take 325 mg by mouth daily with breakfast., Disp: , Rfl:  ;  glucose blood (ACCU-CHEK AVIVA PLUS) test strip, Use as instructed, Disp: 100 each, Rfl: 3 LANTUS SOLOSTAR 100 UNIT/ML Solostar Pen, INJECT 40 UNITS AT BEDTIME, Disp: 15 pen, Rfl: 3;  levothyroxine (SYNTHROID, LEVOTHROID) 125 MCG tablet, Take 125 mcg by mouth daily before breakfast., Disp: , Rfl: ;  metFORMIN (GLUCOPHAGE) 1000 MG tablet, TAKE 1 TABLET (1,000 MG TOTAL) BY MOUTH 2 (TWO) TIMES DAILY WITH A MEAL., Disp: 180 tablet, Rfl: 0;  metoprolol (LOPRESSOR) 50 MG tablet, TAKE 1 TABLET BY MOUTH ONCE DAILY., Disp: 30 tablet, Rfl: 5 NOVOLOG FLEXPEN 100 UNIT/ML FlexPen, USE AS DIRECTED. BEFORE A MEALS 5-10 UNITS, Disp: 1 pen, Rfl: 1;  prednisoLONE acetate (PRED FORTE) 1 % ophthalmic suspension, , Disp: , Rfl: ;  Sennosides-Docusate Sodium (STOOL SOFTENER & LAXATIVE PO), Take 1 tablet by mouth daily as needed (constipation). , Disp: , Rfl: ;  simvastatin (ZOCOR) 20 MG tablet, TAKE 1 TABLET BY MOUTH EVERY DAY, Disp: 90 tablet, Rfl: 3 TRIBENZOR 40-10-25 MG TABS, TAKE 1 TABLET BY MOUTH EVERY MORNING., Disp: 90 tablet, Rfl: 0;  venlafaxine (EFFEXOR) 37.5 MG tablet, TAKE 1 TABLET (37.5 MG TOTAL) BY MOUTH 2 (TWO) TIMES DAILY., Disp: 60 tablet, Rfl: 5;  vitamin B-12 (CYANOCOBALAMIN) 1000 MCG tablet, Take 1,000 mcg by mouth daily., Disp: , Rfl:   EXAM:  Filed Vitals:   08/27/14 1045  BP: 136/64  Pulse: 85  Temp: 98.3 F (36.8 C)    Body mass index is 47.59 kg/(m^2).  GENERAL: vitals reviewed and listed above, alert, oriented, appears well hydrated and in no acute distress  HEENT: atraumatic, conjunttiva clear, no obvious abnormalities on inspection of external nose and ears  NECK: no obvious masses on inspection  LUNGS: clear to auscultation bilaterally, no wheezes, rales or rhonchi, good air movement  CV: HRRR, no peripheral edema  MS: moves all extremities without noticeable abnormality  SKIN: erythema R LE, area marked, decreased swelling  PSYCH: pleasant and cooperative, no obvious  depression or anxiety  ASSESSMENT AND PLAN:  Discussed the following assessment and plan:  Cellulitis of leg, R -swelling, pain and symptoms improved -still with erythema and warmth, area marked -cont abx, elevation, return and emergency precautions -[place] neg for DVT  Hyperlipemia - Plan: CANCELED: Lipid Panel  Essential hypertension, benign - Plan: Basic metabolic panel  Type II or unspecified type diabetes mellitus with peripheral circulatory disorders, uncontrolled(250.72) - Plan: Hemoglobin A1c, Microalbumin/Creatinine Ratio, Urine, Basic metabolic panel  Unspecified  hypothyroidism - Plan: TSH  Iron deficiency anemia - Plan: CBC with Differential  -Patient advised to return or notify a doctor immediately if symptoms worsen or persist or new concerns arise.  Patient Instructions  -elevated leg above waist for 30 minutes twice daily  -take antibioitc twice daily until finished  -followup next week to recheck leg and go over your labs  -schedule diabetic eye exam  -get labs today as you leave      Ariz Terrones, Jarrett Soho R.

## 2014-09-02 ENCOUNTER — Other Ambulatory Visit: Payer: Medicare PPO

## 2014-09-02 DIAGNOSIS — E039 Hypothyroidism, unspecified: Secondary | ICD-10-CM

## 2014-09-02 DIAGNOSIS — D509 Iron deficiency anemia, unspecified: Secondary | ICD-10-CM

## 2014-09-02 DIAGNOSIS — E1159 Type 2 diabetes mellitus with other circulatory complications: Secondary | ICD-10-CM

## 2014-09-02 DIAGNOSIS — I10 Essential (primary) hypertension: Secondary | ICD-10-CM

## 2014-09-03 ENCOUNTER — Ambulatory Visit: Payer: Medicare PPO | Admitting: Family Medicine

## 2014-09-06 ENCOUNTER — Other Ambulatory Visit: Payer: Self-pay | Admitting: Family Medicine

## 2014-09-07 ENCOUNTER — Encounter: Payer: Medicare PPO | Admitting: Family Medicine

## 2014-09-07 NOTE — Progress Notes (Signed)
Error   This encounter was created in error - please disregard. 

## 2014-09-08 ENCOUNTER — Other Ambulatory Visit: Payer: Self-pay | Admitting: *Deleted

## 2014-09-08 ENCOUNTER — Other Ambulatory Visit (INDEPENDENT_AMBULATORY_CARE_PROVIDER_SITE_OTHER): Payer: Medicare PPO

## 2014-09-08 DIAGNOSIS — E1159 Type 2 diabetes mellitus with other circulatory complications: Secondary | ICD-10-CM

## 2014-09-08 DIAGNOSIS — I10 Essential (primary) hypertension: Secondary | ICD-10-CM

## 2014-09-08 DIAGNOSIS — E039 Hypothyroidism, unspecified: Secondary | ICD-10-CM

## 2014-09-08 DIAGNOSIS — D509 Iron deficiency anemia, unspecified: Secondary | ICD-10-CM

## 2014-09-08 DIAGNOSIS — E785 Hyperlipidemia, unspecified: Secondary | ICD-10-CM

## 2014-09-08 LAB — LIPID PANEL
CHOLESTEROL: 115 mg/dL (ref 0–200)
HDL: 39 mg/dL — AB (ref >39.00)
LDL Cholesterol: 56 mg/dL (ref 0–99)
NonHDL: 76
Total CHOL/HDL Ratio: 3
Triglycerides: 100 mg/dL (ref 0.0–149.0)
VLDL: 20 mg/dL (ref 0.0–40.0)

## 2014-09-08 LAB — BASIC METABOLIC PANEL
BUN: 22 mg/dL (ref 6–23)
CHLORIDE: 104 meq/L (ref 96–112)
CO2: 23 mEq/L (ref 19–32)
Calcium: 9.6 mg/dL (ref 8.4–10.5)
Creatinine, Ser: 0.7 mg/dL (ref 0.4–1.2)
GFR: 106.83 mL/min (ref >60.00)
Glucose, Bld: 134 mg/dL — ABNORMAL HIGH (ref 70–99)
POTASSIUM: 3.9 meq/L (ref 3.5–5.1)
Sodium: 140 mEq/L (ref 135–145)

## 2014-09-08 LAB — TSH: TSH: 0.73 u[IU]/mL (ref 0.35–4.50)

## 2014-09-08 LAB — HEMOGLOBIN A1C: Hgb A1c MFr Bld: 7.3 % — ABNORMAL HIGH (ref 4.6–6.5)

## 2014-09-08 MED ORDER — LEVOTHYROXINE SODIUM 125 MCG PO TABS: 125.0000 ug | ORAL_TABLET | Freq: Every day | ORAL | Status: DC

## 2014-09-08 NOTE — Telephone Encounter (Signed)
Rx sent and the pt is aware.

## 2014-09-08 NOTE — Telephone Encounter (Signed)
Per Dr Maudie Mercury only a weeks supply of Levothyroxine was sent in for the pt as she needs to have lab tests first.  I called the pt and informed her to be sure to keep her appt on 9/18 and she stated she is here in the office now for lab tests.

## 2014-09-09 LAB — CBC WITH DIFFERENTIAL/PLATELET
BASOS PCT: 0.2 % (ref 0.0–3.0)
Basophils Absolute: 0 10*3/uL (ref 0.0–0.1)
EOS PCT: 6.2 % — AB (ref 0.0–5.0)
Eosinophils Absolute: 0.3 10*3/uL (ref 0.0–0.7)
HCT: 38.5 % (ref 36.0–46.0)
Hemoglobin: 12.5 g/dL (ref 12.0–15.0)
LYMPHS PCT: 20.9 % (ref 12.0–46.0)
Lymphs Abs: 1.1 10*3/uL (ref 0.7–4.0)
MCHC: 32.5 g/dL (ref 30.0–36.0)
MCV: 89 fl (ref 78.0–100.0)
Monocytes Absolute: 0.2 10*3/uL (ref 0.1–1.0)
Monocytes Relative: 3.4 % (ref 3.0–12.0)
NEUTROS PCT: 69.3 % (ref 43.0–77.0)
Neutro Abs: 3.6 10*3/uL (ref 1.4–7.7)
Platelets: 372 10*3/uL (ref 150.0–400.0)
RBC: 4.33 Mil/uL (ref 3.87–5.11)
RDW: 16.5 % — ABNORMAL HIGH (ref 11.5–15.5)
WBC: 5.1 10*3/uL (ref 4.0–10.5)

## 2014-09-09 LAB — MICROALBUMIN / CREATININE URINE RATIO
CREATININE, U: 208 mg/dL
MICROALB UR: 21.4 mg/dL — AB (ref 0.0–1.9)
Microalb Creat Ratio: 10.3 mg/g (ref 0.0–30.0)

## 2014-09-11 ENCOUNTER — Encounter: Payer: Self-pay | Admitting: Family Medicine

## 2014-09-11 ENCOUNTER — Ambulatory Visit (INDEPENDENT_AMBULATORY_CARE_PROVIDER_SITE_OTHER): Payer: Medicare PPO | Admitting: Family Medicine

## 2014-09-11 VITALS — BP 122/68 | HR 54 | Temp 97.6°F | Ht 61.5 in | Wt 253.0 lb

## 2014-09-11 DIAGNOSIS — E785 Hyperlipidemia, unspecified: Secondary | ICD-10-CM

## 2014-09-11 DIAGNOSIS — E1159 Type 2 diabetes mellitus with other circulatory complications: Secondary | ICD-10-CM

## 2014-09-11 DIAGNOSIS — Z23 Encounter for immunization: Secondary | ICD-10-CM

## 2014-09-11 DIAGNOSIS — E039 Hypothyroidism, unspecified: Secondary | ICD-10-CM

## 2014-09-11 DIAGNOSIS — F3289 Other specified depressive episodes: Secondary | ICD-10-CM

## 2014-09-11 DIAGNOSIS — F329 Major depressive disorder, single episode, unspecified: Secondary | ICD-10-CM

## 2014-09-11 DIAGNOSIS — F32A Depression, unspecified: Secondary | ICD-10-CM

## 2014-09-11 DIAGNOSIS — I10 Essential (primary) hypertension: Secondary | ICD-10-CM

## 2014-09-11 MED ORDER — LEVOTHYROXINE SODIUM 125 MCG PO TABS: 125.0000 ug | ORAL_TABLET | Freq: Every day | ORAL | Status: DC

## 2014-09-11 NOTE — Progress Notes (Signed)
Pre visit review using our clinic review tool, if applicable. No additional management support is needed unless otherwise documented below in the visit note. 

## 2014-09-11 NOTE — Progress Notes (Signed)
No chief complaint on file.   HPI:  1)Cellulitis:  -R LE  -[place] neg for DVT  -s/p tx with (rocephin 1 g) and doxy  -reports: has not elevated leg, feels better, no chills, no fever, not using compression - pain and redness resolved -denies: malaise, fever, chills, spreading -chornic mild swelling in legs   DM/HLD, HTN  -poor compliance with care and follow up recs  -meds:bidil, clonidine, metformin 1000 mg bid, metoprolol, novolog 5-7 units with meals, lantus 25 unit daily in the morning, tribenzor  -sometimes misses her insulin with meals -denies: polyuria, polydipsia, CP, SOB, hypoglycemia, skin lesions -last eye exam: saw eye doctor yesterday, Dr. Zadie Rhine -seeing podiatrist next week for nail care -home BS: fasting blood sugars around 100-160; not checking postprandial -diet and exercise: chicken or [place], beans, wendy salad with pecans and croutons sometimes and chocolate frosty at least 1 time per week, chicken noodle soup, chili, oatmeal or cereal for breakfast, bacon and eggs and biscuits; no regular exercise - but did sign up for th y Lab Results  Component Value Date   HGBA1C 7.3* 09/08/2014   Lab Results  Component Value Date   CHOL 115 09/08/2014   HDL 39.00* 09/08/2014   [place] 56 09/08/2014   TRIG 100.0 09/08/2014   CHOLHDL 3 09/08/2014   Hypothyroid:  -stable on synthroid  Lab Results  Component Value Date   TSH 0.73 09/08/2014   Anemia:  Stable on iron therapy  Had colonoscopy with polpys, repeat advised in 2017  Denies melena, heamtochezia  ROS: See pertinent positives and negatives per HPI.  Past Medical History  Diagnosis Date  . Hypertension   . Hyperlipidemia   . Diabetes mellitus without complication   . Thyroid disease   . Gout 06/30/2013  . Depression 06/30/2013  . GERD (gastroesophageal reflux disease)   . Spinal stenosis of lumbar region     s/p spine surgery 02/12/13 with Dr Marcos Eke at [facility]  . Anemia   . Arthritis   . Cancer     cervical  .  Chicken pox   . Diverticulitis   . Chronic bronchitis   . Frequent headaches   . Colon polyp   . Urine incontinence     Past Surgical History  Procedure Laterality Date  . Back surgery  01/2013  . Cervical cone biopsy    . Abdominal hysterectomy  1970    complete hysterectomy for cervical cancer  . Back surgery  1995  . Wrist surgery  2010    for wrist fracture    Family History  Problem Relation Age of Onset  . Alcohol abuse Father   . Alcohol abuse Brother   . Arthritis      parents  . Hyperlipidemia      parent  . Stroke      grandparent  . Hypertension      parent/grandparent  . Diabetes      parent/grandparent  . Breast cancer Sister   . Heart disease Other   . Mental illness Other   . Colon cancer Neg Hx   . Esophageal cancer Neg Hx   . Prostate cancer Neg Hx   . Rectal cancer Neg Hx     History   Social History  . Marital Status: Divorced    Spouse Name: N/A    Number of Children: N/A  . Years of Education: N/A   Social History Main Topics  . Smoking status: Former Smoker    Quit date: 12/25/1964  .  Smokeless tobacco: Never Used  . Alcohol Use: Yes     Comment: occ with steak dinner   . Drug Use: No  . Sexual Activity: None   Other Topics Concern  . 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 [place]: exercising on regular visit; trying to work on diet             Current outpatient prescriptions:ACCU-CHEK SOFTCLIX LANCETS lancets, Use as instructed, Disp: 100 each, Rfl: 3;  acetaminophen (TYLENOL) 500 MG tablet, Take 500 mg by mouth every 8 (eight) hours., Disp: , Rfl: ;  allopurinol (ZYLOPRIM) 300 MG tablet, Take 300 mg by mouth daily., Disp: , Rfl: ;  aspirin 81 MG tablet, Take 81 mg by mouth daily., Disp: , Rfl: ;  B-D UF III MINI PEN NEEDLES 31G X 5 MM MISC, USE AS DIRECTED, Disp: 100 each, Rfl: 0 BIDIL  20-37.5 MG per tablet, Take 1 tablet by mouth 2 (two) times daily., Disp: 180 tablet, Rfl: 1;  Blood Glucose Calibration (ACCU-CHEK AVIVA) SOLN, Use as directed., Disp: 1 each, Rfl: 1;  Blood Glucose Monitoring Suppl (ACCU-CHEK AVIVA PLUS) W/DEVICE KIT, 1 Units by Does not apply route once., Disp: 1 kit, Rfl: 0;  calcium-vitamin D (OSCAL 500/200 D-3) 500-200 MG-UNIT per tablet, Take 1 tablet by mouth daily., Disp: , Rfl:  cloNIDine (CATAPRES) 0.1 MG tablet, Take 0.1 mg by mouth 2 (two) times daily., Disp: , Rfl: ;  doxycycline (VIBRA-TABS) 100 MG tablet, Take 1 tablet (100 mg total) by mouth 2 (two) times daily., Disp: 20 tablet, Rfl: 0;  ferrous sulfate 325 (65 FE) MG tablet, Take 325 mg by mouth daily with breakfast., Disp: , Rfl: ;  glucose blood (ACCU-CHEK AVIVA PLUS) test strip, Use as instructed, Disp: 100 each, Rfl: 3 LANTUS SOLOSTAR 100 UNIT/ML Solostar Pen, INJECT 40 UNITS AT BEDTIME, Disp: 15 pen, Rfl: 3;  levothyroxine (SYNTHROID, LEVOTHROID) 125 MCG tablet, Take 1 tablet (125 mcg total) by mouth daily before breakfast., Disp: 30 tablet, Rfl: 5;  metFORMIN (GLUCOPHAGE) 1000 MG tablet, TAKE 1 TABLET (1,000 MG TOTAL) BY MOUTH 2 (TWO) TIMES DAILY WITH A MEAL., Disp: 180 tablet, Rfl: 0 metoprolol (LOPRESSOR) 50 MG tablet, TAKE 1 TABLET BY MOUTH ONCE DAILY., Disp: 30 tablet, Rfl: 5;  NOVOLOG FLEXPEN 100 UNIT/ML FlexPen, USE AS DIRECTED. BEFORE A MEALS 5-10 UNITS, Disp: 1 pen, Rfl: 1;  prednisoLONE acetate (PRED FORTE) 1 % ophthalmic suspension, , Disp: , Rfl: ;  Sennosides-Docusate Sodium (STOOL SOFTENER & LAXATIVE PO), Take 1 tablet by mouth daily as needed (constipation). , Disp: , Rfl:  simvastatin (ZOCOR) 20 MG tablet, TAKE 1 TABLET BY MOUTH EVERY DAY, Disp: 90 tablet, Rfl: 3;  TRIBENZOR 40-10-25 MG TABS, TAKE 1 TABLET BY MOUTH EVERY MORNING., Disp: 90 tablet, Rfl: 0;  venlafaxine (EFFEXOR) 37.5 MG tablet, TAKE 1 TABLET (37.5 MG TOTAL) BY MOUTH 2 (TWO) TIMES DAILY., Disp: 60 tablet, Rfl: 5;  vitamin  B-12 (CYANOCOBALAMIN) 1000 MCG tablet, Take 1,000 mcg by mouth daily., Disp: , Rfl:   EXAM:  Filed Vitals:   09/11/14 1009  BP: 122/68  Pulse: 54  Temp: 97.6 F (36.4 C)    Body mass index is 47.04 kg/(m^2).  GENERAL: vitals reviewed and listed above, alert, oriented, appears well hydrated and in no acute distress  HEENT: atraumatic, conjunttiva clear,  no obvious abnormalities on inspection of external nose and ears  NECK: no obvious masses on inspection  LUNGS: clear to auscultation bilaterally, no wheezes, rales or rhonchi, good air movement  CV: HRRR, no peripheral edema  MS: moves all extremities without noticeable abnormality  PSYCH: pleasant and cooperative, no obvious depression or anxiety  FOOT EXAM: see chart  ASSESSMENT AND PLAN:  Discussed the following assessment and plan:  Essential hypertension, benign  Hyperlipemia  Type II or unspecified type diabetes mellitus with peripheral circulatory disorders, uncontrolled(250.72) -discussed importance of healthy diet and exercise at length -advised 30 minutes of walking at least 4 days per week, then increase -advised decreasing carbs and sugars and eating at least 3 small healthy meals per day and to avoid missing mealtime insulin -advised adjusting mealtime insulin up to 7-10 units with meal when eating sweets (frosty, or breads and simple carbs) and keeping track of some postprandial blood sugars with this change  Unspecified hypothyroidism - Plan: levothyroxine (SYNTHROID, LEVOTHROID) 125 MCG tablet  -Patient advised to return or notify a doctor immediately if symptoms worsen or persist or new concerns arise.  Patient Instructions  FOR YOUR DIABETES:  '[]'  Eat a healthy low carb diet (avoid sweets, sweet drinks, breads, potatoes, rice, etc.) and ensure 3 small meals daily.  '[]'  Get AT LEAST 150 minutes of cardiovascular exercise per week - 30 minutes per day is best of sustained sweaty exercise.  '[]'  Take  all of your medications every day as directed by your doctor. Call your doctor immediately if you have any questions about your medications or are running low.  '[]' Increase mealtime insulin to 7-10 units based on carbs or sugars in meal   '[]'  Check your blood sugar often and when you feel unwell and keep a log to bring to all health appointments. FASTING: before you eat anything in the morning POSTPRANDIAL: 1-2 hours after a meal Keep a log! Bring to appointment.  '[]'  If any low blood sugars < 70, eat a snack and call your doctor immediately.  '[]'  See an eye doctor every year and fax your diabetic eye exam to our office.  Fax: [phone]  '[]'  Take good care of your feet and keep them soft and callus free. Check your feet daily and wear comfortable shoes. See your doctor immediately if you have any cuts, calluses or wounds on your feet.      Colin Benton R.

## 2014-09-11 NOTE — Patient Instructions (Signed)
FOR YOUR DIABETES:  []  Eat a healthy low carb diet (avoid sweets, sweet drinks, breads, potatoes, rice, etc.) and ensure 3 small meals daily.  []  Get AT LEAST 150 minutes of cardiovascular exercise per week - 30 minutes per day is best of sustained sweaty exercise.  []  Take all of your medications every day as directed by your doctor. Call your doctor immediately if you have any questions about your medications or are running low.  [] Increase mealtime insulin to 7-10 units based on carbs or sugars in meal   []  Check your blood sugar often and when you feel unwell and keep a log to bring to all health appointments. FASTING: before you eat anything in the morning POSTPRANDIAL: 1-2 hours after a meal Keep a log! Bring to appointment.  []  If any low blood sugars < 70, eat a snack and call your doctor immediately.  []  See an eye doctor every year and fax your diabetic eye exam to our office.  Fax: [phone]  []  Take good care of your feet and keep them soft and callus free. Check your feet daily and wear comfortable shoes. See your doctor immediately if you have any cuts, calluses or wounds on your feet.

## 2014-09-11 NOTE — Addendum Note (Signed)
Addended by: Agnes Lawrence on: 09/11/2014 11:53 AM   Modules accepted: Orders

## 2014-09-14 NOTE — Telephone Encounter (Signed)
Pt was seen on 09/11/14 and states Dr. Maudie Mercury was going to send in RX for TRIBENZOR 40-10-25 MG TABS

## 2014-09-15 ENCOUNTER — Other Ambulatory Visit: Payer: Self-pay | Admitting: Family Medicine

## 2014-09-15 MED ORDER — OLMESARTAN-AMLODIPINE-HCTZ 40-10-25 MG PO TABS: ORAL_TABLET | ORAL | Status: DC

## 2014-09-21 ENCOUNTER — Other Ambulatory Visit: Payer: Self-pay | Admitting: Family Medicine

## 2014-10-04 ENCOUNTER — Other Ambulatory Visit: Payer: Self-pay | Admitting: Family Medicine

## 2014-10-07 ENCOUNTER — Other Ambulatory Visit: Payer: Self-pay | Admitting: Family Medicine

## 2014-10-15 ENCOUNTER — Other Ambulatory Visit: Payer: Self-pay | Admitting: Family Medicine

## 2014-10-19 ENCOUNTER — Other Ambulatory Visit: Payer: Self-pay | Admitting: Family Medicine

## 2014-10-29 ENCOUNTER — Other Ambulatory Visit: Payer: Self-pay | Admitting: Family Medicine

## 2014-11-05 ENCOUNTER — Other Ambulatory Visit: Payer: Self-pay | Admitting: Family Medicine

## 2014-11-08 ENCOUNTER — Other Ambulatory Visit: Payer: Self-pay | Admitting: Family Medicine

## 2014-11-09 ENCOUNTER — Other Ambulatory Visit: Payer: Self-pay | Admitting: Family Medicine

## 2014-12-06 ENCOUNTER — Other Ambulatory Visit: Payer: Self-pay | Admitting: Family Medicine

## 2014-12-09 ENCOUNTER — Other Ambulatory Visit: Payer: Self-pay | Admitting: Family Medicine

## 2014-12-11 ENCOUNTER — Ambulatory Visit: Payer: Medicare PPO | Admitting: Family Medicine

## 2014-12-20 ENCOUNTER — Other Ambulatory Visit: Payer: Self-pay | Admitting: Family Medicine

## 2015-01-01 ENCOUNTER — Telehealth: Payer: Self-pay | Admitting: Family Medicine

## 2015-01-05 ENCOUNTER — Other Ambulatory Visit: Payer: Self-pay | Admitting: Family Medicine

## 2015-01-06 ENCOUNTER — Ambulatory Visit: Payer: Medicare PPO | Admitting: Family Medicine

## 2015-01-06 NOTE — Progress Notes (Signed)
Patient no showed again for an appointment. Will have my assistant contact her. Difficult to help her with her medical issues as she frequently does not come to appointments.  Reviewed chart prior to appointment with summary of chronic medical problems:  DM, HTN  -poor compliance with care and follow up recs  -meds:asa, metformin 1000 mg bid, novolog 5-7 units with meals, lantus 25 unit daily in the morning, arb -sometimes misses her insulin with meals -last eye exam: saw eye doctor, Dr. Zadie Rhine 08/2014 -seeing podiatrist  -home BS: fasting blood sugars around 100-160; not checking postprandial -diet and exercise: last visit reported chicken or [place], beans, wendy salad with pecans and croutons sometimes and chocolate frosty at least 1 time per week, chicken noodle soup, chili, oatmeal or cereal for breakfast, bacon and eggs and biscuits; no regular exercise - but did sign up for th y  HLD: -meds simvastatin 20 -stable  HTN: -meds: bidil, clonidine, metoprolol, tribenzor  Hypothyroid:  -stable on synthroid   Anemia:  Stable on iron therapy from prior PCP Had colonoscopy with polpys, repeat advised in 2017   Hx Gout: -on allopurinol from prior PCP

## 2015-01-08 IMAGING — CT CT ABD-PELV W/ CM
2 of 5 series · 16 of 46 positions shown, 18 images · IV contrast (APPLIED)
Comparison: None.

CLINICAL DATA: Right-sided abdominal pain.  Recent colonoscopy.

EXAM:
CT ABDOMEN AND PELVIS WITH CONTRAST
TECHNIQUE: Multidetector CT imaging of the abdomen and pelvis was performed
using the standard protocol following bolus administration of
intravenous contrast.
CONTRAST:  100 cc Omnipaque 300

[Series 2: abd/ pelvis 5.0 i30f 1 · axial · 0.87mm/px · z∈[+856,+1291]mm · 13 of 99 slices shown, 15 images]
[im 6/99  soft-tissue]
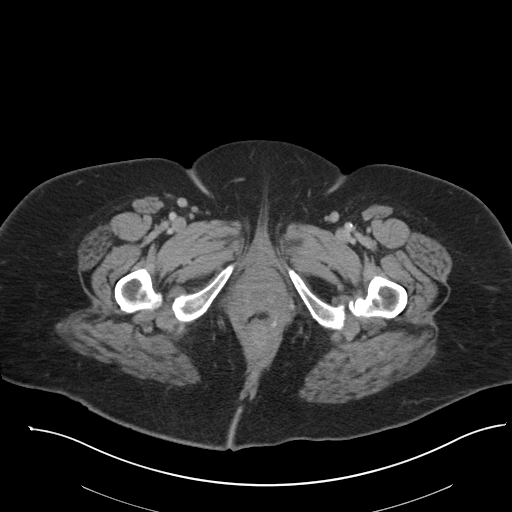
[im 6/99  bone]
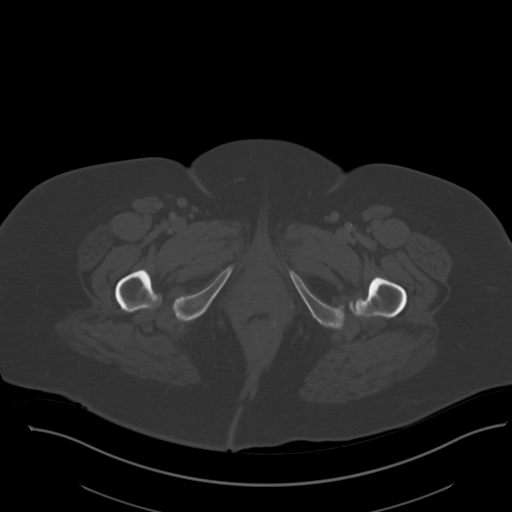
[im 12/99  soft-tissue]
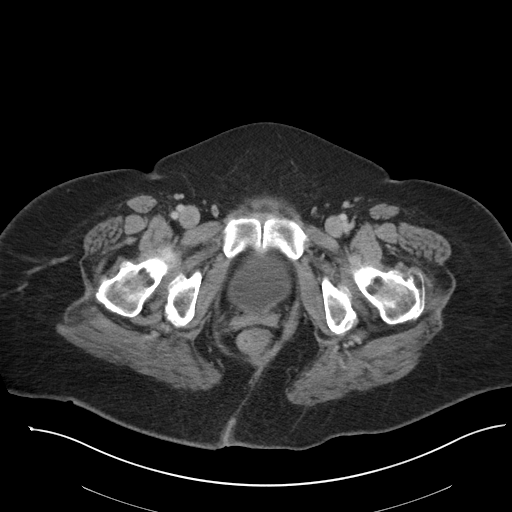
[im 24/99  soft-tissue]
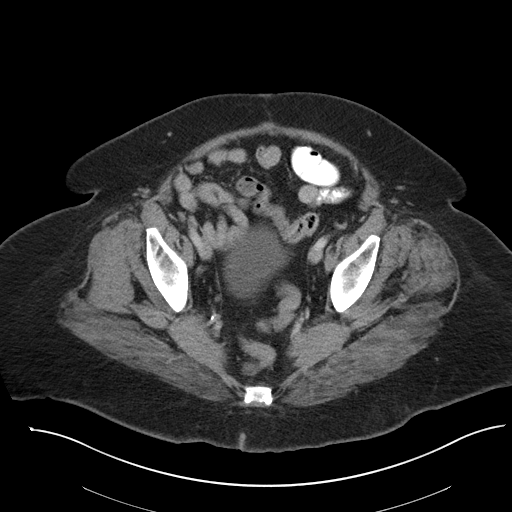
[im 29/99  soft-tissue]
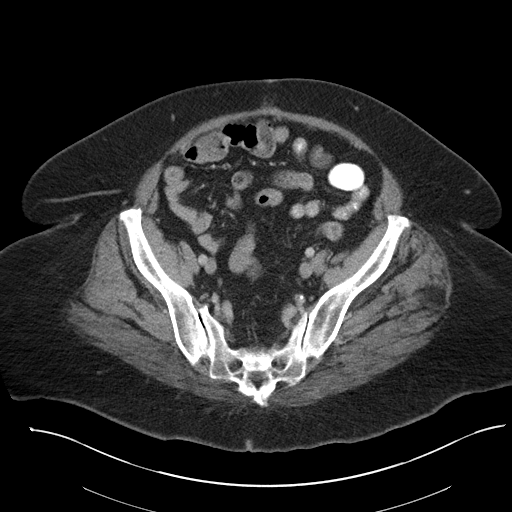
[im 35/99  soft-tissue]
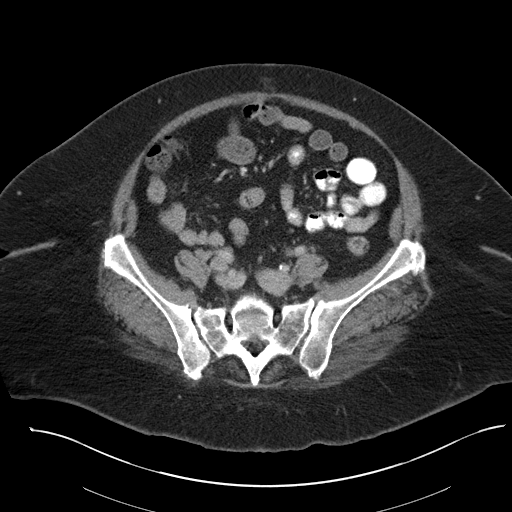
[im 41/99  soft-tissue]
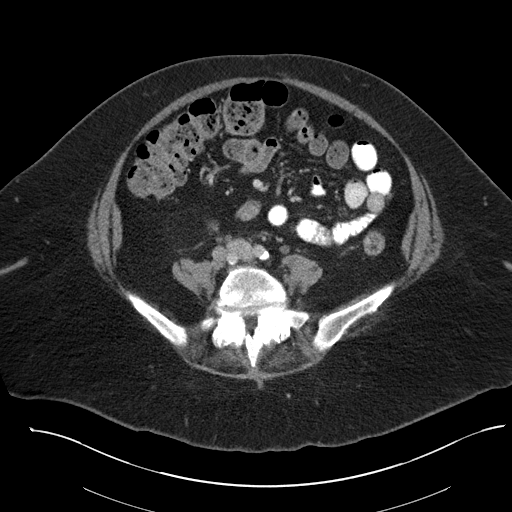
[im 52/99  soft-tissue]
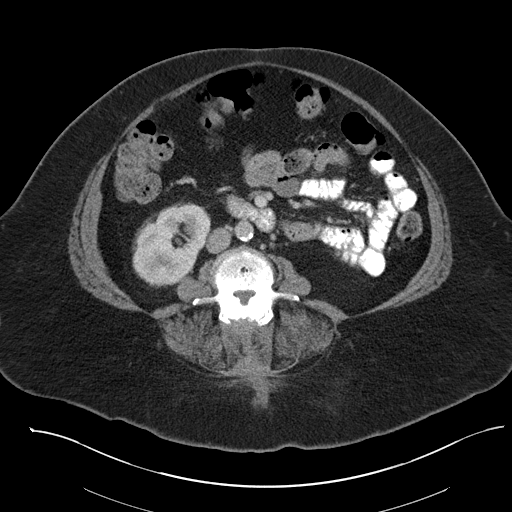
[im 58/99  soft-tissue]
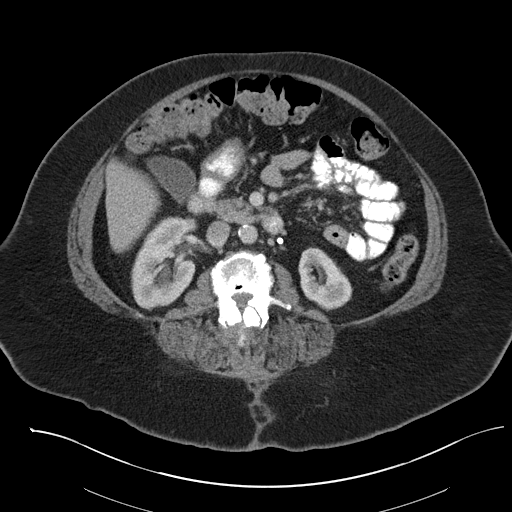
[im 64/99  soft-tissue]
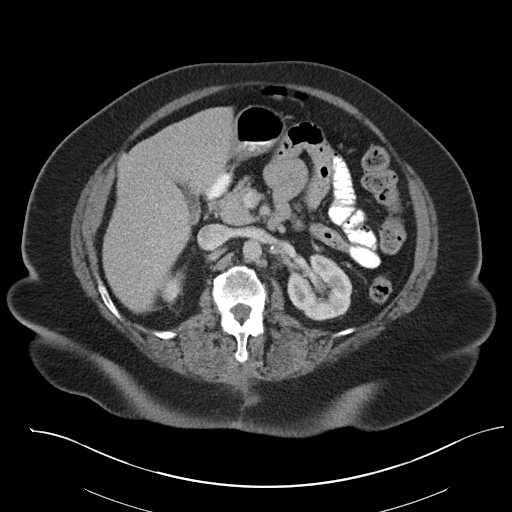
[im 64/99  bone]
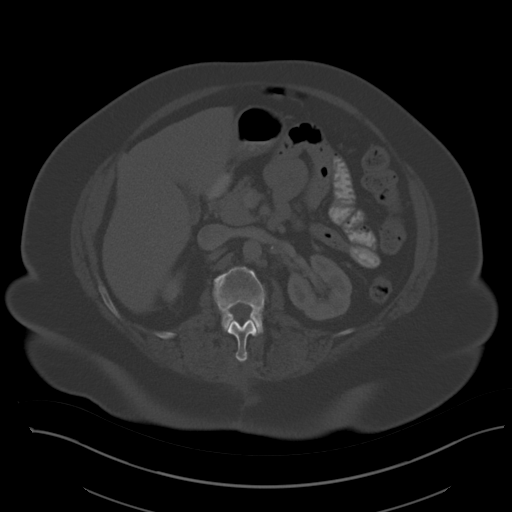
[im 70/99  soft-tissue]
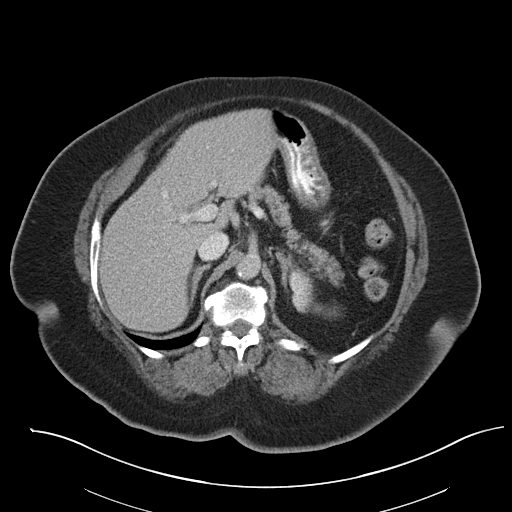
[im 75/99  soft-tissue]
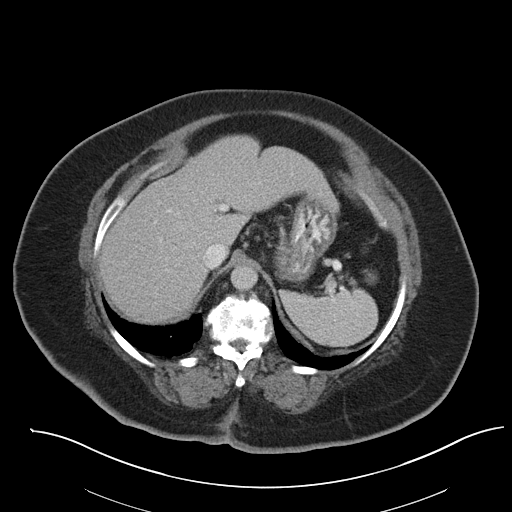
[im 87/99  soft-tissue]
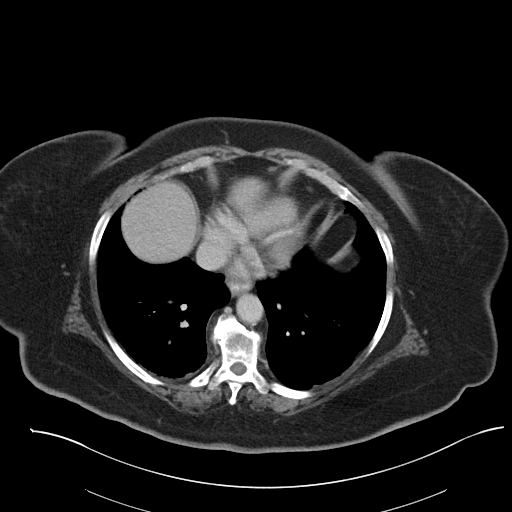
[im 93/99  soft-tissue]
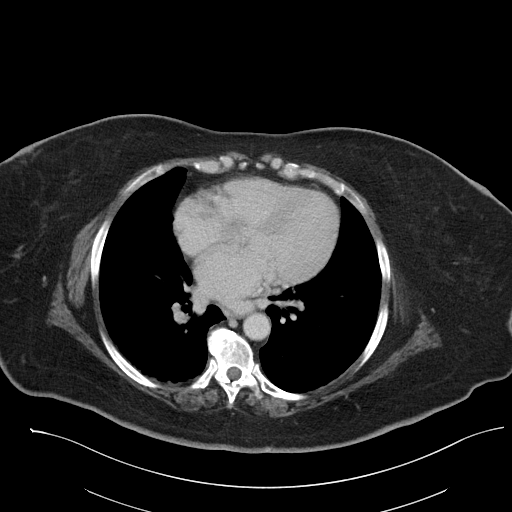

[Series 5: cor · coronal · 0.86mm/px · 3 of 170 slices shown]
[im 57/170  soft-tissue]
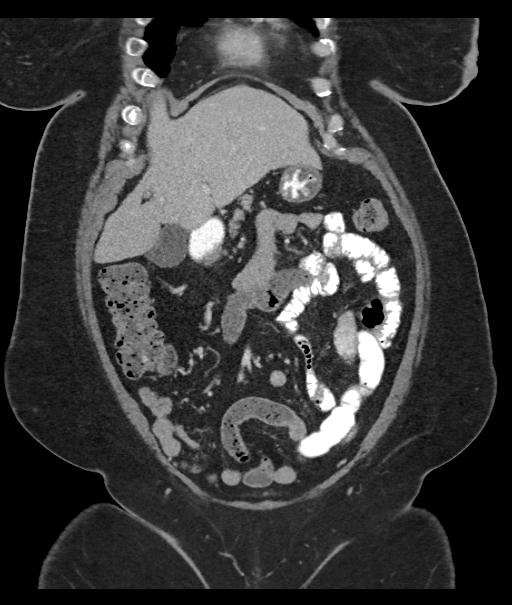
[im 76/170  soft-tissue]
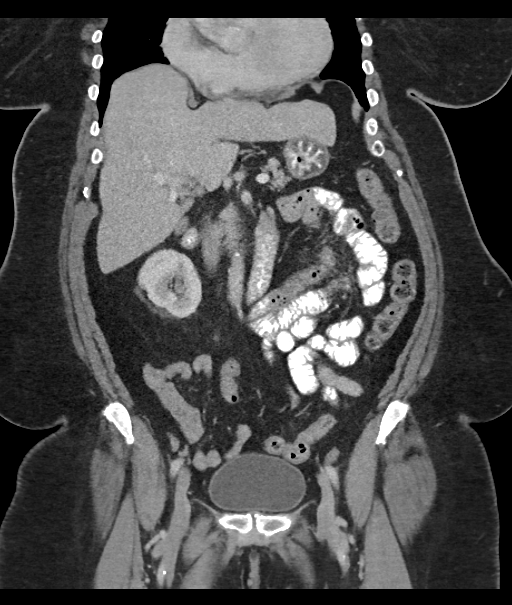
[im 94/170  soft-tissue]
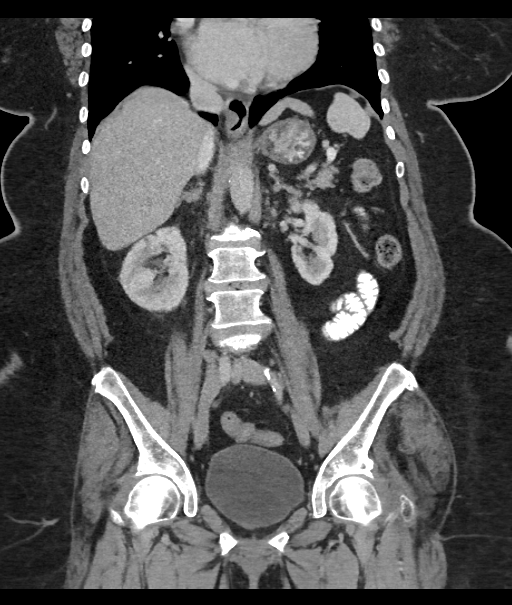

[16 of 46 positions shown; findings below may reference images not displayed]

FINDINGS: Contrast medium in the distal esophagus suggests gastroesophageal
reflux.

Body habitus reduces diagnostic sensitivity and specificity. The
liver, spleen, pancreas, and adrenal glands appear unremarkable.

There is fullness of the right collecting system associated with a
retrocaval ureter. The left ureter in urinary bladder appear
unremarkable. There is a vascular calcification adjacent to the
right ureter on image 55 of series 2 but this is clearly outside of
the ureter. Mildly asymmetric right para renal stranding noted
several renal cysts are present on the left.

Postoperative findings in the lumbar spine. Aortoiliac
atherosclerotic vascular disease.

The cecum is near the midline appendix not well seen.

No free pelvic fluid.  Uterus absent.  Ovaries not well seen.

Small umbilical hernia contains adipose tissue. Pelvic floor laxity
noted with the urinary bladder extending below the pubococcygeal
line. Multilevel facet arthropathy in the lumbar spine is present,
causing multilevel foraminal stenosis.

Degenerative findings are present in the acetabula, right greater
than left.
IMPRESSION: 1. Mild fullness of the right collecting system with mildly
asymmetric right para renal stranding, likely due to the retrocaval
ureter, an anatomic variant. Currently there is no overt
hydronephrosis.
2. Atherosclerosis.
3. Gastroesophageal reflux.
4. Swallow umbilical hernia contains adipose tissue.
5. Pelvic floor laxity.
6. Prominent lumbar spondylosis causing multilevel foraminal
impingement.

## 2015-01-14 ENCOUNTER — Other Ambulatory Visit: Payer: Self-pay | Admitting: Family Medicine

## 2015-01-14 ENCOUNTER — Telehealth: Payer: Self-pay | Admitting: Family Medicine

## 2015-01-14 MED ORDER — ISOSORB DINITRATE-HYDRALAZINE 20-37.5 MG PO TABS: 1.0000 | ORAL_TABLET | Freq: Two times a day (BID) | ORAL | Status: DC

## 2015-01-14 MED ORDER — VENLAFAXINE HCL 37.5 MG PO TABS: 37.5000 mg | ORAL_TABLET | Freq: Two times a day (BID) | ORAL | Status: DC

## 2015-01-14 NOTE — Telephone Encounter (Signed)
[facility] states [facility] [address] told her they never received the below medication sent on 01/01/15.  Can you please re-send?

## 2015-01-14 NOTE — Telephone Encounter (Signed)
Patient would like BIDIL 20-37.5 MG per tablet sent to [id number] - [place],  - [address].  She said [facility] is unable to get the medication in stock.

## 2015-01-14 NOTE — Telephone Encounter (Signed)
Rx done. 

## 2015-01-14 NOTE — Telephone Encounter (Signed)
Rxs done. 

## 2015-01-20 ENCOUNTER — Ambulatory Visit (INDEPENDENT_AMBULATORY_CARE_PROVIDER_SITE_OTHER): Payer: Medicare PPO | Admitting: Family Medicine

## 2015-01-20 ENCOUNTER — Encounter: Payer: Self-pay | Admitting: Family Medicine

## 2015-01-20 ENCOUNTER — Telehealth: Payer: Self-pay

## 2015-01-20 VITALS — BP 144/72 | HR 57 | Temp 97.5°F | Ht 61.5 in | Wt 255.2 lb

## 2015-01-20 DIAGNOSIS — F329 Major depressive disorder, single episode, unspecified: Secondary | ICD-10-CM

## 2015-01-20 DIAGNOSIS — F32A Depression, unspecified: Secondary | ICD-10-CM

## 2015-01-20 DIAGNOSIS — E785 Hyperlipidemia, unspecified: Secondary | ICD-10-CM

## 2015-01-20 DIAGNOSIS — I10 Essential (primary) hypertension: Secondary | ICD-10-CM

## 2015-01-20 DIAGNOSIS — E1159 Type 2 diabetes mellitus with other circulatory complications: Secondary | ICD-10-CM

## 2015-01-20 DIAGNOSIS — E039 Hypothyroidism, unspecified: Secondary | ICD-10-CM

## 2015-01-20 MED ORDER — INSULIN ASPART 100 UNIT/ML FLEXPEN: PEN_INJECTOR | SUBCUTANEOUS | Status: DC

## 2015-01-20 MED ORDER — METFORMIN HCL 1000 MG PO TABS: 1000.0000 mg | ORAL_TABLET | Freq: Two times a day (BID) | ORAL | Status: DC

## 2015-01-20 MED ORDER — CLONIDINE HCL 0.1 MG PO TABS: 0.1000 mg | ORAL_TABLET | Freq: Two times a day (BID) | ORAL | Status: DC

## 2015-01-20 MED ORDER — ISOSORB DINITRATE-HYDRALAZINE 20-37.5 MG PO TABS
1.0000 | ORAL_TABLET | Freq: Two times a day (BID) | ORAL | Status: DC
Start: 2015-01-20 — End: 2015-04-27

## 2015-01-20 MED ORDER — SIMVASTATIN 20 MG PO TABS: 20.0000 mg | ORAL_TABLET | Freq: Every day | ORAL | Status: DC

## 2015-01-20 MED ORDER — OLMESARTAN-AMLODIPINE-HCTZ 40-10-25 MG PO TABS: ORAL_TABLET | ORAL | Status: DC

## 2015-01-20 MED ORDER — INSULIN GLARGINE 100 UNIT/ML SOLOSTAR PEN: 40.0000 [IU] | PEN_INJECTOR | Freq: Every day | SUBCUTANEOUS | Status: DC

## 2015-01-20 MED ORDER — METOPROLOL TARTRATE 50 MG PO TABS: 50.0000 mg | ORAL_TABLET | Freq: Every day | ORAL | Status: DC

## 2015-01-20 NOTE — Progress Notes (Signed)
HPI:  Note:Difficult to help her with her medical issues as she frequently does not come to appointments. She reports missed last appt as grandson was sick with strep throat and virus. She is having trouble affording [facility]. She helps care for her children and her grandchildren and I suspect from what she tells me, has little time or energy leftover to care for herself, remember to take medications or come to appointments. She has to make choices about buying meds or buying food.   Reviewed chart prior to appointment with summary of chronic medical problems:  URI: -started 2 days ago -nasal congestion, mild cough, PND -denies: fever, SOB, NVD, sinus pain, tooth pain -has not tried anything  DM: -poor compliance with care and follow up recs  -meds:asa, metformin 1000 mg bid, novolog 5-7 units with meals, lantus 25 unit daily in the morning, arb -sometimes misses her insulin with meals -last eye exam: saw eye doctor, Dr. Rankin 08/2014 -seeing podiatrist  -diet and exercise: last visit reported chicken or turkey, beans, wendy salad with pecans and croutons sometimes and chocolate frosty at least 1 time per week, chicken noodle soup, chili, oatmeal or cereal for breakfast, bacon and eggs and biscuits; no regular exercise - but did sign up for th y  HLD: -meds simvastatin 20 -stable  HTN/Chronic LE edema: -meds: bidil (isosorbide/hydralazine), clonidine, metoprolol, tribenzor (olmesartan-amlodipine-hctz) -denies: CP, SOB, DOE, palpitiations -chronic LE edema intermittently, used to wear compression - but does not any longer  Hypothyroid:  -stable on synthroid   Anemia:  Stable on iron therapy from prior PCP Had colonoscopy with polpys, repeat advised in 2017   Hx Gout: -on allopurinol from prior PCP  ROS: See pertinent positives and negatives per HPI.  Past Medical History  Diagnosis Date  . Hypertension   . Hyperlipidemia   . Diabetes  mellitus without complication   . Thyroid disease   . Gout 06/30/2013  . Depression 06/30/2013  . GERD (gastroesophageal reflux disease)   . Spinal stenosis of lumbar region     s/p spine surgery 02/12/13 with Dr Gottfried at [facility]  . Anemia   . Arthritis   . Cancer     cervical  . Chicken pox   . Diverticulitis   . Chronic bronchitis   . Frequent headaches   . Colon polyp   . Urine incontinence     Past Surgical History  Procedure Laterality Date  . Back surgery  01/2013  . Cervical cone biopsy    . Abdominal hysterectomy  1970    complete hysterectomy for cervical cancer  . Back surgery  1995  . Wrist surgery  2010    for wrist fracture    Family History  Problem Relation Age of Onset  . Alcohol abuse Father   . Alcohol abuse Brother   . Arthritis      parents  . Hyperlipidemia      parent  . Stroke      grandparent  . Hypertension      parent/grandparent  . Diabetes      parent/grandparent  . Breast cancer Sister   . Heart disease Other   . Mental illness Other   . Colon cancer Neg Hx   . Esophageal cancer Neg Hx   . Prostate cancer Neg Hx   . Rectal cancer Neg Hx     History   Social History  . Marital Status: Divorced    Spouse Name: N/A      Number of Children: N/A  . Years of Education: N/A   Social History Main Topics  . Smoking status: Former Smoker    Quit date: 12/25/1964  . Smokeless tobacco: Never Used  . Alcohol Use: Yes     Comment: occ with steak dinner   . Drug Use: No  . Sexual Activity: None   Other Topics Concern  . None   Social History Narrative   Work or School: retired highschool principal      Home Situation: lives with son in [place]      Spiritual Beliefs: Christian      Lifestyle: exercising on regular visit; trying to work on diet              Current outpatient prescriptions:  .  ACCU-CHEK SOFTCLIX LANCETS lancets, Use as instructed, Disp: 100 each, Rfl: 3 .  acetaminophen (TYLENOL) 500 MG tablet, Take  500 mg by mouth every 8 (eight) hours., Disp: , Rfl:  .  allopurinol (ZYLOPRIM) 300 MG tablet, TAKE 1 TABLET BY MOUTH ONCE DAILY, Disp: 30 tablet, Rfl: 0 .  aspirin 81 MG tablet, Take 81 mg by mouth daily., Disp: , Rfl:  .  B-D UF III MINI PEN NEEDLES 31G X 5 MM MISC, USE AS DIRECTED, Disp: 100 each, Rfl: 1 .  Blood Glucose Calibration (ACCU-CHEK AVIVA) SOLN, Use as directed., Disp: 1 each, Rfl: 1 .  Blood Glucose Monitoring Suppl (ACCU-CHEK AVIVA PLUS) W/DEVICE KIT, 1 Units by Does not apply route once., Disp: 1 kit, Rfl: 0 .  calcium-vitamin D (OSCAL 500/200 D-3) 500-200 MG-UNIT per tablet, Take 1 tablet by mouth daily., Disp: , Rfl:  .  cloNIDine (CATAPRES) 0.1 MG tablet, TAKE 1 TABLET TWICE DAILY, Disp: 60 tablet, Rfl: 1 .  doxycycline (VIBRA-TABS) 100 MG tablet, Take 1 tablet (100 mg total) by mouth 2 (two) times daily., Disp: 20 tablet, Rfl: 0 .  ferrous sulfate 325 (65 FE) MG tablet, Take 325 mg by mouth daily with breakfast., Disp: , Rfl:  .  glucose blood (ACCU-CHEK AVIVA PLUS) test strip, Use as instructed, Disp: 100 each, Rfl: 3 .  isosorbide-hydrALAZINE (BIDIL) 20-37.5 MG per tablet, Take 1 tablet by mouth 2 (two) times daily., Disp: 60 tablet, Rfl: 0 .  LANTUS SOLOSTAR 100 UNIT/ML Solostar Pen, INJECT 40 UNITS AT BEDTIME, Disp: 3 mL, Rfl: 3 .  levothyroxine (SYNTHROID, LEVOTHROID) 125 MCG tablet, Take 1 tablet (125 mcg total) by mouth daily before breakfast., Disp: 30 tablet, Rfl: 5 .  metFORMIN (GLUCOPHAGE) 1000 MG tablet, TAKE 1 TABLET (1,000 MG TOTAL) BY MOUTH 2 (TWO) TIMES DAILY WITH A MEAL., Disp: 180 tablet, Rfl: 0 .  metoprolol (LOPRESSOR) 50 MG tablet, TAKE 1 TABLET BY MOUTH ONCE DAILY., Disp: 30 tablet, Rfl: 5 .  NOVOLOG FLEXPEN 100 UNIT/ML FlexPen, USE AS DIRECTED. BEFORE A MEALS 5-10 UNITS, Disp: 1 pen, Rfl: 1 .  Olmesartan-Amlodipine-HCTZ (TRIBENZOR) 40-10-25 MG TABS, TAKE 1 TABLET BY MOUTH EVERY MORNING., Disp: 90 tablet, Rfl: 3 .  prednisoLONE acetate (PRED FORTE) 1 %  ophthalmic suspension, , Disp: , Rfl:  .  Sennosides-Docusate Sodium (STOOL SOFTENER & LAXATIVE PO), Take 1 tablet by mouth daily as needed (constipation). , Disp: , Rfl:  .  simvastatin (ZOCOR) 20 MG tablet, TAKE 1 TABLET BY MOUTH EVERY DAY, Disp: 90 tablet, Rfl: 3 .  venlafaxine (EFFEXOR) 37.5 MG tablet, TAKE 1 TABLET (37.5 MG TOTAL) BY MOUTH 2 (TWO) TIMES DAILY., Disp: 60 tablet, Rfl: 5 .  vitamin B-12 (CYANOCOBALAMIN) 1000   MCG tablet, Take 1,000 mcg by mouth daily., Disp: , Rfl:   EXAM:  Filed Vitals:   01/20/15 1034  BP: 144/72  Pulse: 57  Temp: 97.5 F (36.4 C)    Body mass index is 47.44 kg/(m^2).  GENERAL: vitals reviewed and listed above, alert, oriented, appears well hydrated and in no acute distress  HEENT: atraumatic, conjunttiva clear, no obvious abnormalities on inspection of external nose and ears, normal appearance of ear canals and TMs, clear nasal congestion, mild post oropharyngeal erythema with PND, no tonsillar edema or exudate, no sinus TTP  NECK: no obvious masses on inspection  LUNGS: clear to auscultation bilaterally, no wheezes, rales or rhonchi, good air movement  CV: HRRR, bilat 1+ peripheral edema  MS: moves all extremities without noticeable abnormality  PSYCH: pleasant and cooperative, no obvious depression or anxiety  ASSESSMENT AND PLAN:  Discussed the following assessment and plan:  Essential hypertension, benign  Hyperlipemia  Type 2 diabetes mellitus with other circulatory complications  Depression  Hypothyroidism, unspecified hypothyroidism type  -printed rx for her diabetes and BP meds for her to take to [facility] and [facility] to compare cost (numbers and addresses provided), advised could split out combo BP meds and would likely be cheaper but she hates taking pills and prefers to keep combos -needs labs but she refused today as has not been good about taking medications and is not fasting, did not take bidil today, wants  to check in 1 month -compression stockings - she reports has several pairs - showed her how to put on -supportive care for VURI -Patient advised to return or notify a doctor immediately if symptoms worsen or persist or new concerns arise.  Patient Instructions  BEFORE YOU LEAVE: -schedule morning appointment in 1 month - COME FASTING  We recommend the following healthy lifestyle measures: - eat a healthy diet consisting of lots of vegetables, fruits, beans, nuts, seeds, healthy meats such as white chicken and fish and whole grains.  - avoid fried foods, fast food, processed foods, sodas, red meet and other fattening foods.  - get a least 150 minutes of aerobic exercise per week.   Compression socks daily for swelling  Please check with [facility] and [facility] () to see if your medications would be cheaper there       KIM, HANNAH R.   

## 2015-01-20 NOTE — Progress Notes (Signed)
Pre visit review using our clinic review tool, if applicable. No additional management support is needed unless otherwise documented below in the visit note. 

## 2015-01-20 NOTE — Telephone Encounter (Signed)
Received a call from Katie Mcintyre at [facility] stating pt was told she may get her medications there cheaper.  Pt's medications were ran and [facility]  Katie Mcintyre states [facility] and was told that pt received 4 days worth of BIDIL because it is currently not available.  The combination of this medication is isosoborbide and hydralazine. Pt states she has been out of the medication for a few days and needs it filled.  Katie Mcintyre noticed that pt's Lantus rx states 40 units at bedtime but pt states she has only been taking 25 units.  Per Katie Mcintyre pt seems confused about her medications.  Pls advise if rx for BIDIL can be sent for the two separate [facility].

## 2015-01-20 NOTE — Patient Instructions (Signed)
BEFORE YOU LEAVE: -schedule morning appointment in 1 month - COME FASTING  We recommend the following healthy lifestyle measures: - eat a healthy diet consisting of lots of vegetables, fruits, beans, nuts, seeds, healthy meats such as white chicken and fish and whole grains.  - avoid fried foods, fast food, processed foods, sodas, red meet and other fattening foods.  - get a least 150 minutes of aerobic exercise per week.   Compression socks daily for swelling  Please check with [facility] ([place]) to see if your medications would be cheaper there

## 2015-01-21 MED ORDER — HYDRALAZINE HCL 25 MG PO TABS: 25.0000 mg | ORAL_TABLET | Freq: Two times a day (BID) | ORAL | Status: DC

## 2015-01-21 MED ORDER — ISOSORBIDE DINITRATE 20 MG PO TABS: 20.0000 mg | ORAL_TABLET | Freq: Two times a day (BID) | ORAL | Status: DC

## 2015-01-21 NOTE — Telephone Encounter (Signed)
Dr Sherren Mocha approved the Rx to be changed to the 2 different medications to substitute for the Bidil and called the pt and informed her this was sent to her pharmacy.  I also changed the dose for Lantus Solostar pen as the pt states Dr Maudie Mercury advised she use 25 units.  I called Aaron Edelman and he stated the second part of Bidil-Hydralazine HCL does not come in 37.5mg  and the 25mg  could be given as this is closest to the dose as if 3-10mg  tablets were given this may be confusing for the pt and [facility]

## 2015-02-04 ENCOUNTER — Other Ambulatory Visit: Payer: Self-pay | Admitting: Family Medicine

## 2015-02-04 ENCOUNTER — Telehealth: Payer: Self-pay

## 2015-02-04 NOTE — Telephone Encounter (Signed)
Rx request for:  Metformin HCL 1000 mg, Novolog Flexpen 100 units/ml subcutaneous, and accu-chek nano smartview meter.  Paper fax sent back to [facility].

## 2015-02-09 ENCOUNTER — Other Ambulatory Visit: Payer: Self-pay | Admitting: Family Medicine

## 2015-02-12 MED ORDER — ACCU-CHEK NANO SMARTVIEW W/DEVICE KIT: 1.0000 | PACK | Freq: Every day | Status: DC

## 2015-02-12 MED ORDER — METFORMIN HCL 1000 MG PO TABS: 1000.0000 mg | ORAL_TABLET | Freq: Two times a day (BID) | ORAL | Status: DC

## 2015-02-12 MED ORDER — INSULIN ASPART 100 UNIT/ML FLEXPEN: PEN_INJECTOR | SUBCUTANEOUS | Status: DC

## 2015-02-12 NOTE — Telephone Encounter (Signed)
Rxs done. 

## 2015-02-12 NOTE — Addendum Note (Signed)
Addended by: Agnes Lawrence on: 02/12/2015 04:12 PM   Modules accepted: Orders

## 2015-02-12 NOTE — Telephone Encounter (Signed)
Patient states [employer] told her they haven't received the below re-fills.  Patient is running out of medication and is requesting the below re-fills [id number] [place], [address].  She needs this done today since she will run out over the weekend.

## 2015-02-12 NOTE — Telephone Encounter (Signed)
I called the pt and informed her a free Accu-chek nano kit was left at the front desk for her to try and if she likes this to call us back for test strips to be called in to her pharmacy and she agreed.

## 2015-02-12 NOTE — Addendum Note (Signed)
Addended by: Agnes Lawrence on: 02/12/2015 04:56 PM   Modules accepted: Orders

## 2015-02-19 ENCOUNTER — Other Ambulatory Visit: Payer: Self-pay | Admitting: Family Medicine

## 2015-02-25 ENCOUNTER — Ambulatory Visit (INDEPENDENT_AMBULATORY_CARE_PROVIDER_SITE_OTHER): Payer: Self-pay | Admitting: Family Medicine

## 2015-02-25 DIAGNOSIS — R69 Illness, unspecified: Secondary | ICD-10-CM

## 2015-02-25 NOTE — Progress Notes (Signed)
NO SHOW  On review of chart prior to appointment:  Note:Difficult to help her with her medical issues as she frequently does not come to appointments. She helps care for her children and her grandchildren and I suspect from what she tells me, has little time or energy leftover to care for herself, remember to take medications or come to appointments. She struggles with her finances and unfortunately reports at times has to make choices about buying meds or buying food.    DM: -poor compliance with care and follow up recs  -meds:asa, metformin 1000 mg bid, novolog 5-7 units with meals, lantus 25 unit daily in the morning, arb -sometimes misses her insulin with meals -last eye exam: saw eye doctor, Dr. Zadie Rhine 08/2014 -seeing podiatrist  -diet and exercise:  reported chicken or [place], beans, wendy salad with pecans and croutons sometimes and chocolate frosty at least 1 time per week, chicken noodle soup, chili, oatmeal or cereal for breakfast, bacon and eggs and biscuits; no regular exercise - but did sign up for th y  HLD: -meds simvastatin 20 -stable  HTN/Chronic LE edema: -meds: bidil (isosorbide/hydralazine), clonidine, metoprolol, tribenzor (olmesartan-amlodipine-hctz) -denies: CP, SOB, DOE, palpitiations -chronic LE edema intermittently - non-compliant with compression  Hypothyroid:  -stable on synthroid   Anemia:  Stable on iron therapy from prior PCP Had colonoscopy with polpys, repeat advised in 2017   Hx Gout: -on allopurinol from prior PCP  HM: -shingles vaccine -dexa

## 2015-03-01 ENCOUNTER — Encounter: Payer: Self-pay | Admitting: Family Medicine

## 2015-03-01 ENCOUNTER — Ambulatory Visit (INDEPENDENT_AMBULATORY_CARE_PROVIDER_SITE_OTHER): Payer: Medicare PPO | Admitting: Family Medicine

## 2015-03-01 VITALS — BP 124/74 | HR 56 | Temp 97.5°F | Ht 61.5 in | Wt 254.3 lb

## 2015-03-01 DIAGNOSIS — Z23 Encounter for immunization: Secondary | ICD-10-CM

## 2015-03-01 DIAGNOSIS — E1159 Type 2 diabetes mellitus with other circulatory complications: Secondary | ICD-10-CM

## 2015-03-01 DIAGNOSIS — E039 Hypothyroidism, unspecified: Secondary | ICD-10-CM

## 2015-03-01 DIAGNOSIS — E785 Hyperlipidemia, unspecified: Secondary | ICD-10-CM

## 2015-03-01 DIAGNOSIS — I10 Essential (primary) hypertension: Secondary | ICD-10-CM

## 2015-03-01 LAB — BASIC METABOLIC PANEL
BUN: 20 mg/dL (ref 6–23)
CO2: 31 mEq/L (ref 19–32)
Calcium: 9.6 mg/dL (ref 8.4–10.5)
Chloride: 101 mEq/L (ref 96–112)
Creatinine, Ser: 0.78 mg/dL (ref 0.40–1.20)
GFR: 92.61 mL/min (ref >60.00)
GLUCOSE: 63 mg/dL — AB (ref 70–99)
Potassium: 4.3 mEq/L (ref 3.5–5.1)
SODIUM: 137 meq/L (ref 135–145)

## 2015-03-01 LAB — HEMOGLOBIN A1C: Hgb A1c MFr Bld: 7.2 % — ABNORMAL HIGH (ref 4.6–6.5)

## 2015-03-01 LAB — TSH: TSH: 2.72 u[IU]/mL (ref 0.35–4.50)

## 2015-03-01 MED ORDER — ACCU-CHEK FASTCLIX LANCETS MISC
Status: DC
Start: 2015-03-01 — End: 2017-05-03

## 2015-03-01 MED ORDER — GLUCOSE BLOOD VI STRP: ORAL_STRIP | Status: DC

## 2015-03-01 MED ORDER — INSULIN GLARGINE 100 UNIT/ML SOLOSTAR PEN: PEN_INJECTOR | SUBCUTANEOUS | Status: DC

## 2015-03-01 MED ORDER — INSULIN GLARGINE 100 UNIT/ML SOLOSTAR PEN: 25.0000 [IU] | PEN_INJECTOR | Freq: Every day | SUBCUTANEOUS | Status: DC

## 2015-03-01 NOTE — Progress Notes (Signed)
HPI:  Note:Difficult to help her with her medical issues as she frequently does not come to appointments. She helps care for her children and her grandchildren and I suspect from what she tells me, has little time or energy leftover to care for herself, remember to take medications or come to appointments. She struggles with her finances and unfortunately reports at times has to make choices about buying meds or buying food.    DM: -poor compliance with care and follow up recs  -meds:asa, metformin 1000 mg bid, novolog 5-7 units with meals, lantus 25 unit daily in the morning, arb -home BS: fasting in 80-90s per her report; no lows -sometimes misses her insulin with meals -last eye exam: saw eye doctor, Dr. Zadie Rhine 08/2014 -seeing podiatrist  -diet and exercise: reported chicken or [place], beans, wendy salad with pecans and croutons sometimes and chocolate frosty at least 1 time per week, chicken noodle soup, chili, oatmeal or cereal for breakfast, bacon and eggs and biscuits; no regular exercise  -denies: BS lower then 68 once, vision changes, wounds on feet  HLD: -meds simvastatin 20 -stable -denies: leg cramps or cog impairment  HTN/Chronic LE edema: -meds: bidil (isosorbide/hydralazine), clonidine, metoprolol, tribenzor (olmesartan-amlodipine-hctz) -denies: CP, SOB, DOE, palpitiations -chronic LE edema intermittently - non-compliant with compression  Hypothyroid:  -stable on synthroid  -denies: hot/cold intol, change in weight  Anemia:  Stable on iron therapy from prior PCP Had colonoscopy with polpys, repeat advised in 2017   Hx Gout: -on allopurinol from prior PCP  HM: -shingles vaccine: declined -dexa: declined -prevnar 13 - declined  ROS: See pertinent positives and negatives per HPI.  Past Medical History  Diagnosis Date  . Hypertension   . Hyperlipidemia   . Diabetes mellitus without complication   . Thyroid disease   . Gout 06/30/2013  . Depression  06/30/2013  . GERD (gastroesophageal reflux disease)   . Spinal stenosis of lumbar region     s/p spine surgery 02/12/13 with Dr Marcos Eke at [facility]  . Anemia   . Arthritis   . Cancer     cervical  . Chicken pox   . Diverticulitis   . Chronic bronchitis   . Frequent headaches   . Colon polyp   . Urine incontinence     Past Surgical History  Procedure Laterality Date  . Back surgery  01/2013  . Cervical cone biopsy    . Abdominal hysterectomy  1970    complete hysterectomy for cervical cancer  . Back surgery  1995  . Wrist surgery  2010    for wrist fracture    Family History  Problem Relation Age of Onset  . Alcohol abuse Father   . Alcohol abuse Brother   . Arthritis      parents  . Hyperlipidemia      parent  . Stroke      grandparent  . Hypertension      parent/grandparent  . Diabetes      parent/grandparent  . Breast cancer Sister   . Heart disease Other   . Mental illness Other   . Colon cancer Neg Hx   . Esophageal cancer Neg Hx   . Prostate cancer Neg Hx   . Rectal cancer Neg Hx     History   Social History  . Marital Status: Divorced    Spouse Name: N/A  . Number of Children: N/A  . Years of Education: N/A   Social History Main Topics  . Smoking status: Former  Smoker    Quit date: 12/25/1964  . Smokeless tobacco: Never Used  . Alcohol Use: Yes     Comment: occ with steak dinner   . Drug Use: No  . Sexual Activity: Not on file   Other Topics Concern  . 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  ACCU-CHEK SOFTCLIX LANCETS lancets, Use as instructed, Disp: 100 each, Rfl: 3 .  acetaminophen (TYLENOL) 500 MG tablet, Take 500 mg by mouth every 8 (eight) hours., Disp: , Rfl:  .  allopurinol (ZYLOPRIM)  300 MG tablet, TAKE 1 TABLET BY MOUTH ONCE DAILY, Disp: 30 tablet, Rfl: 0 .  aspirin 81 MG tablet, Take 81 mg by mouth daily., Disp: , Rfl:  .  B-D UF III MINI PEN NEEDLES 31G X 5 MM MISC, USE AS DIRECTED, Disp: 100 each, Rfl: 1 .  Blood Glucose Calibration (ACCU-CHEK AVIVA) SOLN, Use as directed., Disp: 1 each, Rfl: 1 .  Blood Glucose Monitoring Suppl (ACCU-CHEK NANO SMARTVIEW) W/DEVICE KIT, 1 each by Does not apply route daily., Disp: 1 kit, Rfl: 0 .  calcium-vitamin D (OSCAL 500/200 D-3) 500-200 MG-UNIT per tablet, Take 1 tablet by mouth daily., Disp: , Rfl:  .  cloNIDine (CATAPRES) 0.1 MG tablet, Take 1 tablet (0.1 mg total) by mouth 2 (two) times daily., Disp: 180 tablet, Rfl: 1 .  ferrous sulfate 325 (65 FE) MG tablet, Take 325 mg by mouth daily with breakfast., Disp: , Rfl:  .  hydrALAZINE (APRESOLINE) 25 MG tablet, Take 1 tablet (25 mg total) by mouth 2 (two) times daily., Disp: 60 tablet, Rfl: 3 .  insulin aspart (NOVOLOG FLEXPEN) 100 UNIT/ML FlexPen, Use as directed before meals 5-10 units, Disp: 1 pen, Rfl: 1 .  Insulin Glargine (LANTUS SOLOSTAR) 100 UNIT/ML Solostar Pen, Inject 25 Units into the skin at bedtime., Disp: 15 pen, Rfl: 1 .  isosorbide dinitrate (ISORDIL) 20 MG tablet, Take 1 tablet (20 mg total) by mouth 2 (two) times daily., Disp: 60 tablet, Rfl: 3 .  isosorbide-hydrALAZINE (BIDIL) 20-37.5 MG per tablet, Take 1 tablet by mouth 2 (two) times daily., Disp: 180 tablet, Rfl: 1 .  levothyroxine (SYNTHROID, LEVOTHROID) 125 MCG tablet, Take 1 tablet (125 mcg total) by mouth daily before breakfast., Disp: 30 tablet, Rfl: 5 .  metFORMIN (GLUCOPHAGE) 1000 MG tablet, Take 1 tablet (1,000 mg total) by mouth 2 (two) times daily with a meal., Disp: 60 tablet, Rfl: 1 .  metoprolol (LOPRESSOR) 50 MG tablet, Take 1 tablet (50 mg total) by mouth daily., Disp: 90 tablet, Rfl: 1 .  Olmesartan-Amlodipine-HCTZ (TRIBENZOR) 40-10-25 MG TABS, TAKE 1 TABLET BY MOUTH EVERY MORNING., Disp: 90 tablet,  Rfl: 1 .  prednisoLONE acetate (PRED FORTE) 1 % ophthalmic suspension, , Disp: , Rfl:  .  Sennosides-Docusate Sodium (STOOL SOFTENER & LAXATIVE PO), Take 1 tablet by mouth daily as needed (constipation). , Disp: , Rfl:  .  simvastatin (ZOCOR) 20 MG tablet, Take 1 tablet (20 mg total) by mouth daily., Disp: 90 tablet, Rfl: 1 .  simvastatin (ZOCOR) 20 MG tablet, TAKE 1 TABLET BY MOUTH EVERY DAY, Disp: 90 tablet, Rfl: 1 .  venlafaxine (EFFEXOR) 37.5  MG tablet, TAKE 1 TABLET (37.5 MG TOTAL) BY MOUTH 2 (TWO) TIMES DAILY., Disp: 60 tablet, Rfl: 5 .  venlafaxine (EFFEXOR) 37.5 MG tablet, TAKE 1 TABLET BY MOUTH TWICE A DAY, Disp: 60 tablet, Rfl: 0 .  vitamin B-12 (CYANOCOBALAMIN) 1000 MCG tablet, Take 1,000 mcg by mouth daily., Disp: , Rfl:   EXAM:  Filed Vitals:   03/01/15 1006  BP: 124/74  Pulse: 56  Temp: 97.5 F (36.4 C)    Body mass index is 47.28 kg/(m^2).  GENERAL: vitals reviewed and listed above, alert, oriented, appears well hydrated and in no acute distress  HEENT: atraumatic, conjunttiva clear, no obvious abnormalities on inspection of external nose and ears  NECK: no obvious masses on inspection  LUNGS: clear to auscultation bilaterally, no wheezes, rales or rhonchi, good air movement  CV: HRRR, bilat LE edema and venous stasis dermatitis  MS: moves all extremities without noticeable abnormality  PSYCH: pleasant and cooperative, no obvious depression or anxiety  FOOT EXAM: see chart  ASSESSMENT AND PLAN:  Discussed the following assessment and plan:  Type 2 diabetes mellitus with other circulatory complications - Plan: Hemoglobin U5K, Basic metabolic panel  Essential hypertension, benign - Plan: Basic metabolic panel  Hyperlipemia - Plan: Lipid Panel  Hypothyroidism, unspecified hypothyroidism type - Plan: TSH  -NON-fasting labs today; needs fasting lipids at follow up or as lab appt -stress importance of taking meds daily -no show policy reviewed with  pt -lifestyle recs -she opted to get prevnar 13 today -Patient advised to return or notify a doctor immediately if symptoms worsen or persist or new concerns arise.  Patient Instructions  BEFORE YOU LEAVE: -prevnar 13 -labs -3 month follow up appointment - come fasting that day  Elevate leg for 30 minutes at least once daily above waist; compression socks during the day  We recommend the following healthy lifestyle measures: - eat a healthy diet consisting of lots of vegetables, fruits, beans, nuts, seeds, healthy meats such as white chicken and fish and whole grains.  - avoid fried foods, fast food, processed foods, sodas, red meet and other fattening foods.  - get a least 150 minutes of aerobic exercise per week.       Colin Benton R.

## 2015-03-01 NOTE — Addendum Note (Signed)
Addended by: Agnes Lawrence on: 03/01/2015 11:51 AM   Modules accepted: Orders

## 2015-03-01 NOTE — Patient Instructions (Addendum)
BEFORE YOU LEAVE: -prevnar 13 -labs -3 month follow up appointment - come fasting that day  Elevate leg for 30 minutes at least once daily above waist; compression socks during the day  We recommend the following healthy lifestyle measures: - eat a healthy diet consisting of lots of vegetables, fruits, beans, nuts, seeds, healthy meats such as white chicken and fish and whole grains.  - avoid fried foods, fast food, processed foods, sodas, red meet and other fattening foods.  - get a least 150 minutes of aerobic exercise per week.

## 2015-03-01 NOTE — Progress Notes (Signed)
Pre visit review using our clinic review tool, if applicable. No additional management support is needed unless otherwise documented below in the visit note. 

## 2015-03-06 ENCOUNTER — Other Ambulatory Visit: Payer: Self-pay | Admitting: Family Medicine

## 2015-03-12 ENCOUNTER — Other Ambulatory Visit: Payer: Self-pay | Admitting: Family Medicine

## 2015-03-18 ENCOUNTER — Telehealth: Payer: Self-pay | Admitting: Family Medicine

## 2015-03-18 ENCOUNTER — Other Ambulatory Visit: Payer: Self-pay | Admitting: Family Medicine

## 2015-03-18 MED ORDER — INSULIN GLARGINE 100 UNIT/ML SOLOSTAR PEN: PEN_INJECTOR | SUBCUTANEOUS | Status: DC

## 2015-03-18 NOTE — Telephone Encounter (Signed)
Rx done. 

## 2015-03-18 NOTE — Telephone Encounter (Signed)
[facility] [address] with the new dose of 25u q am. The one they have reads 40u.

## 2015-03-23 ENCOUNTER — Telehealth: Payer: Self-pay | Admitting: Family Medicine

## 2015-03-23 MED ORDER — INSULIN GLARGINE 100 UNIT/ML SOLOSTAR PEN: 25.0000 [IU] | PEN_INJECTOR | Freq: Every day | SUBCUTANEOUS | Status: DC

## 2015-03-23 NOTE — Telephone Encounter (Signed)
Rx done. 

## 2015-03-23 NOTE — Telephone Encounter (Signed)
Pt called to say that she has been paying 80.00 for 5 pens and her insurance company say that the can get a 90 day supply for 80.00. Pt is asking for a new rx of the following med   Insulin Glargine (LANTUS SOLOSTAR) 100 UNIT/ML Solostar Pen and it needs to be for 90 day supply   Pharmacy ; [place]

## 2015-03-27 ENCOUNTER — Other Ambulatory Visit: Payer: Self-pay | Admitting: Family Medicine

## 2015-04-10 ENCOUNTER — Other Ambulatory Visit: Payer: Self-pay | Admitting: Family Medicine

## 2015-04-27 ENCOUNTER — Encounter: Payer: Self-pay | Admitting: Family Medicine

## 2015-04-27 ENCOUNTER — Ambulatory Visit (INDEPENDENT_AMBULATORY_CARE_PROVIDER_SITE_OTHER): Payer: Medicare PPO | Admitting: Family Medicine

## 2015-04-27 VITALS — BP 144/78 | HR 75 | Temp 98.3°F | Ht 61.5 in | Wt 251.4 lb

## 2015-04-27 DIAGNOSIS — R21 Rash and other nonspecific skin eruption: Secondary | ICD-10-CM | POA: Diagnosis not present

## 2015-04-27 DIAGNOSIS — I10 Essential (primary) hypertension: Secondary | ICD-10-CM

## 2015-04-27 DIAGNOSIS — E1159 Type 2 diabetes mellitus with other circulatory complications: Secondary | ICD-10-CM

## 2015-04-27 MED ORDER — AMLODIPINE BESYLATE 5 MG PO TABS: 5.0000 mg | ORAL_TABLET | Freq: Every day | ORAL | Status: DC

## 2015-04-27 MED ORDER — LOSARTAN POTASSIUM 50 MG PO TABS: 50.0000 mg | ORAL_TABLET | Freq: Every day | ORAL | Status: DC

## 2015-04-27 NOTE — Patient Instructions (Addendum)
BEFORE YOU LEAVE: -schedule follow up in 1 month  STOP the tribenzor  START norvasc (amlodipine) 5 mg once daily and losartan 50mg  daily  We recommend the following healthy lifestyle measures: - eat a healthy diet consisting of lots of vegetables, fruits, beans, nuts, seeds, healthy meats such as white chicken and fish  - avoid starches and sweets, fried foods, fast food, processed foods, sodas, red meet and other fattening foods.  - get a least 150 minutes of aerobic exercise per week.    Triamcinilone cream 2 times daily for the rash and do not scratch.

## 2015-04-27 NOTE — Progress Notes (Signed)
Pre visit review using our clinic review tool, if applicable. No additional management support is needed unless otherwise documented below in the visit note. 

## 2015-04-27 NOTE — Progress Notes (Addendum)
HPI:  Acute visit for:  1) Rash: -Reports started several months -itchy rash on R arm only -denies: pain, rash elsewhere, spreading  2)HTN: -Can't afford tribenzor (40-10-25) (30 days for $80) - ok with separating the medications out for > 2 weeks -she has been checking her BP at home and reports it has been in the 130-140s/80s -denies: CP, SOB, DOE, HA  3)Morbid obesity/DM: -she is working on diet, limited exercise -she reports she has not used her lantus in at least 1 month as can not afford it -she is using the novolog 7 units with each meal, she eats regular meals 3 times per day and denies any low blood sugars -reports her fasting blood sugars have been good  ROS: See pertinent positives and negatives per HPI.  Past Medical History  Diagnosis Date  . Hypertension   . Hyperlipidemia   . Diabetes mellitus without complication   . Thyroid disease   . Gout 06/30/2013  . Depression 06/30/2013  . GERD (gastroesophageal reflux disease)   . Spinal stenosis of lumbar region     s/p spine surgery 02/12/13 with Dr Marcos Eke at [facility]  . Anemia   . Arthritis   . Cancer     cervical  . Chicken pox   . Diverticulitis   . Chronic bronchitis   . Frequent headaches   . Colon polyp   . Urine incontinence     Past Surgical History  Procedure Laterality Date  . Back surgery  01/2013  . Cervical cone biopsy    . Abdominal hysterectomy  1970    complete hysterectomy for cervical cancer  . Back surgery  1995  . Wrist surgery  2010    for wrist fracture    Family History  Problem Relation Age of Onset  . Alcohol abuse Father   . Alcohol abuse Brother   . Arthritis      parents  . Hyperlipidemia      parent  . Stroke      grandparent  . Hypertension      parent/grandparent  . Diabetes      parent/grandparent  . Breast cancer Sister   . Heart disease Other   . Mental illness Other   . Colon cancer Neg Hx   . Esophageal cancer Neg Hx   . Prostate cancer Neg Hx   .  Rectal cancer Neg Hx     History   Social History  . Marital Status: Divorced    Spouse Name: N/A  . Number of Children: N/A  . Years of Education: N/A   Social History Main Topics  . Smoking status: Former Smoker    Quit date: 12/25/1964  . Smokeless tobacco: Never Used  . Alcohol Use: Yes     Comment: occ with steak dinner   . Drug Use: No  . Sexual Activity: Not on file   Other Topics Concern  . 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  ACCU-CHEK FASTCLIX LANCETS MISC, Use as directed, Disp: 100 each, Rfl: 1 .  acetaminophen (TYLENOL) 500 MG tablet, Take 500 mg by mouth every 8 (eight) hours., Disp: , Rfl:  .  allopurinol (ZYLOPRIM) 300 MG tablet, TAKE 1 TABLET BY MOUTH ONCE DAILY, Disp: 30 tablet, Rfl: 0 .  aspirin 81 MG tablet, Take 81 mg by mouth daily., Disp: , Rfl:  .  B-D UF III MINI PEN NEEDLES 31G X 5 MM MISC, USE AS DIRECTED, Disp: 100 each, Rfl: 1 .  Blood Glucose Calibration (ACCU-CHEK AVIVA) SOLN, Use as directed., Disp: 1 each, Rfl: 1 .  calcium-vitamin D (OSCAL 500/200 D-3) 500-200 MG-UNIT per tablet, Take 1 tablet by mouth daily., Disp: , Rfl:  .  cloNIDine (CATAPRES) 0.1 MG tablet, Take 1 tablet (0.1 mg total) by mouth 2 (two) times daily., Disp: 180 tablet, Rfl: 1 .  ferrous sulfate 325 (65 FE) MG tablet, Take 325 mg by mouth daily with breakfast., Disp: , Rfl:  .  glucose blood (ACCU-CHEK SMARTVIEW) test strip, Use as instructed to check blood sugar twice a day, Disp: 100 each, Rfl: 1 .  hydrALAZINE (APRESOLINE) 25 MG tablet, Take 1 tablet (25 mg total) by mouth 2 (two) times daily., Disp: 60 tablet, Rfl: 3 .  insulin aspart (NOVOLOG FLEXPEN) 100 UNIT/ML FlexPen, Use as directed before meals 5-10 units, Disp: 1  pen, Rfl: 1 .  Insulin Glargine (LANTUS SOLOSTAR) 100 UNIT/ML Solostar Pen, Inject 25 Units into the skin at bedtime., Disp: 15 pen, Rfl: 3 .  isosorbide dinitrate (ISORDIL) 20 MG tablet, Take 1 tablet (20 mg total) by mouth 2 (two) times daily., Disp: 60 tablet, Rfl: 3 .  metFORMIN (GLUCOPHAGE) 1000 MG tablet, Take 1 tablet (1,000 mg total) by mouth 2 (two) times daily with a meal., Disp: 60 tablet, Rfl: 1 .  metoprolol (LOPRESSOR) 50 MG tablet, Take 1 tablet (50 mg total) by mouth daily., Disp: 90 tablet, Rfl: 1 .  prednisoLONE acetate (PRED FORTE) 1 % ophthalmic suspension, , Disp: , Rfl:  .  Sennosides-Docusate Sodium (STOOL SOFTENER & LAXATIVE PO), Take 1 tablet by mouth daily as needed (constipation). , Disp: , Rfl:  .  simvastatin (ZOCOR) 20 MG tablet, TAKE 1 TABLET BY MOUTH EVERY DAY, Disp: 90 tablet, Rfl: 1 .  SYNTHROID 125 MCG tablet, TAKE 1 TABLET (125 MCG TOTAL) BY MOUTH DAILY BEFORE BREAKFAST., Disp: 30 tablet, Rfl: 3 .  venlafaxine (EFFEXOR) 37.5 MG tablet, TAKE 1 TABLET (37.5 MG TOTAL) BY MOUTH 2 (TWO) TIMES DAILY., Disp: 60 tablet, Rfl: 5 .  vitamin B-12 (CYANOCOBALAMIN) 1000 MCG tablet, Take 1,000 mcg by mouth daily., Disp: , Rfl:  .  amLODipine (NORVASC) 5 MG tablet, Take 1 tablet (5 mg total) by mouth daily., Disp: 90 tablet, Rfl: 3 .  losartan (COZAAR) 50 MG tablet, Take 1 tablet (50 mg total) by mouth daily., Disp: 90 tablet, Rfl: 3  EXAM:  Filed Vitals:   04/27/15 1408  BP: 144/78  Pulse: 75  Temp: 98.3 F (36.8 C)    Body mass index is 46.74 kg/(m^2).  GENERAL: vitals reviewed and listed above, alert, oriented, appears well hydrated and in no acute distress  HEENT: atraumatic, conjunttiva clear, no obvious abnormalities on inspection of external nose and ears  NECK: no obvious masses on inspection  LUNGS: clear to auscultation bilaterally, no wheezes, rales or rhonchi, good air movement  CV: HRRR, no peripheral edema  SKIN: scattered excoriated papules on  the R arm and shoulder only  MS: moves all extremities without noticeable abnormality  PSYCH: pleasant and cooperative, no obvious depression or anxiety  ASSESSMENT AND PLAN:  Discussed the following assessment and plan:  Rash and nonspecific skin  eruption -unsure of etiology but 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 -trial topical steroid for 2 weeks and dermatology eval if not improving  Essential hypertension, benign -stop tribenzor -simplify given BP ok to norvasc and losartan which should be much more affordable and will give her more money to afford her insulin -follow up in 4 weeks  Morbid Obesity: -lifestyle recs advised  DM -discussed options and she has opted to continue with mealtime insulin for now to use up her current supply and she and I will look into other options in terms of cost -given regular meals may consider mixed insulin if more affordable -cont monitoring at home and close follow up  -Patient advised to return or notify a doctor immediately if symptoms worsen or persist or new concerns arise.  Patient Instructions  BEFORE YOU LEAVE: -schedule follow up in 1 month  STOP the tribenzor  START norvasc (amlodipine) 5 mg once daily and losartan 50mg  daily  We recommend the following healthy lifestyle measures: - eat a healthy diet consisting of lots of vegetables, fruits, beans, nuts, seeds, healthy meats such as white chicken and fish  - avoid starches and sweets, fried foods, fast food, processed foods, sodas, red meet and other fattening foods.  - get a least 150 minutes of aerobic exercise per week.    Triamcinilone cream 2 times daily for the rash and do not scratch.     Colin Benton R.

## 2015-04-28 ENCOUNTER — Other Ambulatory Visit: Payer: Self-pay | Admitting: Family Medicine

## 2015-05-17 ENCOUNTER — Telehealth: Payer: Self-pay | Admitting: Family Medicine

## 2015-05-17 ENCOUNTER — Encounter: Payer: Self-pay | Admitting: Family Medicine

## 2015-05-17 ENCOUNTER — Other Ambulatory Visit: Payer: Self-pay | Admitting: Family Medicine

## 2015-05-17 ENCOUNTER — Ambulatory Visit (INDEPENDENT_AMBULATORY_CARE_PROVIDER_SITE_OTHER): Payer: Medicare PPO | Admitting: Family Medicine

## 2015-05-17 VITALS — BP 138/71 | HR 89 | Temp 97.2°F | Ht 61.5 in | Wt 248.7 lb

## 2015-05-17 DIAGNOSIS — E1159 Type 2 diabetes mellitus with other circulatory complications: Secondary | ICD-10-CM | POA: Diagnosis not present

## 2015-05-17 DIAGNOSIS — H109 Unspecified conjunctivitis: Secondary | ICD-10-CM

## 2015-05-17 MED ORDER — ERYTHROMYCIN 5 MG/GM OP OINT: 1.0000 "application " | TOPICAL_OINTMENT | Freq: Every day | OPHTHALMIC | Status: DC

## 2015-05-17 NOTE — Telephone Encounter (Signed)
I called the pt and scheduled a 30 min appt per Dr Maudie Mercury.

## 2015-05-17 NOTE — Telephone Encounter (Signed)
Noted and appointment scheduled

## 2015-05-17 NOTE — Telephone Encounter (Signed)
Katie Mcintyre from [place] called because she was having trouble scheduling Katie Mcintyre for a 24 hr outcome appointment. Katie Mcintyre advised Katie Mcintyre, that someone from our office will call her with the appointment time.

## 2015-05-17 NOTE — Progress Notes (Signed)
HPI:  Acute visit for:  Eye redness: -started 1-2 days ago -reports: L eye itchy, watery and red, runny nose, PND -denies: trauma, pain, vision changes, fevers, SOB, sinus pain, HA  DM: - reports was donated unopened mixed insulin, humolog mix 75/25 kwikpen, unexpired and refrigerated -wants to switch to mixed insulin until runs out of this - also is interested in simpler regimen -she has not been using her lantus as was too expensive and has only been using mealtime insulin only (novolog flexpen) - 10 units with each meal -she does eat 3 regular meals per day -denies any low blood sugar, reports BS is 125 - 205 on fasting, afternoon and evening checks -denies hypoglycemia, fevers, malaise  ROS: See pertinent positives and negatives per HPI.  Past Medical History  Diagnosis Date  . Hypertension   . Hyperlipidemia   . Diabetes mellitus without complication   . Thyroid disease   . Gout 06/30/2013  . Depression 06/30/2013  . GERD (gastroesophageal reflux disease)   . Spinal stenosis of lumbar region     s/p spine surgery 02/12/13 with Dr Marcos Eke at [facility]  . Anemia   . Arthritis   . Cancer     cervical  . Chicken pox   . Diverticulitis   . Chronic bronchitis   . Frequent headaches   . Colon polyp   . Urine incontinence     Past Surgical History  Procedure Laterality Date  . Back surgery  01/2013  . Cervical cone biopsy    . Abdominal hysterectomy  1970    complete hysterectomy for cervical cancer  . Back surgery  1995  . Wrist surgery  2010    for wrist fracture    Family History  Problem Relation Age of Onset  . Alcohol abuse Father   . Alcohol abuse Brother   . Arthritis      parents  . Hyperlipidemia      parent  . Stroke      grandparent  . Hypertension      parent/grandparent  . Diabetes      parent/grandparent  . Breast cancer Sister   . Heart disease Other   . Mental illness Other   . Colon cancer Neg Hx   . Esophageal cancer Neg Hx   . Prostate  cancer Neg Hx   . Rectal cancer Neg Hx     History   Social History  . Marital Status: Divorced    Spouse Name: N/A  . Number of Children: N/A  . Years of Education: N/A   Social History Main Topics  . Smoking status: Former Smoker    Quit date: 12/25/1964  . Smokeless tobacco: Never Used  . Alcohol Use: Yes     Comment: occ with steak dinner   . Drug Use: No  . Sexual Activity: Not on file   Other Topics Concern  . 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  ACCU-CHEK FASTCLIX LANCETS MISC, Use as directed, Disp: 100 each, Rfl: 1 .  acetaminophen (TYLENOL) 500 MG tablet, Take 500 mg by mouth every 8 (eight) hours., Disp: , Rfl:  .  allopurinol (  ZYLOPRIM) 300 MG tablet, TAKE 1 TABLET BY MOUTH ONCE DAILY, Disp: 30 tablet, Rfl: 0 .  amLODipine (NORVASC) 5 MG tablet, Take 1 tablet (5 mg total) by mouth daily., Disp: 90 tablet, Rfl: 3 .  aspirin 81 MG tablet, Take 81 mg by mouth daily., Disp: , Rfl:  .  B-D UF III MINI PEN NEEDLES 31G X 5 MM MISC, USE AS DIRECTED, Disp: 100 each, Rfl: 1 .  Blood Glucose Calibration (ACCU-CHEK AVIVA) SOLN, Use as directed., Disp: 1 each, Rfl: 1 .  calcium-vitamin D (OSCAL 500/200 D-3) 500-200 MG-UNIT per tablet, Take 1 tablet by mouth daily., Disp: , Rfl:  .  cloNIDine (CATAPRES) 0.1 MG tablet, Take 1 tablet (0.1 mg total) by mouth 2 (two) times daily., Disp: 180 tablet, Rfl: 1 .  cloNIDine (CATAPRES) 0.1 MG tablet, TAKE 1 TABLET BY MOUTH TWICE A DAY, Disp: 60 tablet, Rfl: 1 .  ferrous sulfate 325 (65 FE) MG tablet, Take 325 mg by mouth daily with breakfast., Disp: , Rfl:  .  glucose blood (ACCU-CHEK SMARTVIEW) test strip, Use as instructed to check blood sugar twice a day, Disp: 100 each, Rfl: 1 .   hydrALAZINE (APRESOLINE) 25 MG tablet, Take 1 tablet (25 mg total) by mouth 2 (two) times daily., Disp: 60 tablet, Rfl: 3 .  insulin lispro protamine-lispro (HUMALOG 75/25 MIX) (75-25) 100 UNIT/ML SUSP injection, Inject 7 Units into the skin 2 (two) times daily with a meal., Disp: , Rfl:  .  isosorbide dinitrate (ISORDIL) 20 MG tablet, Take 1 tablet (20 mg total) by mouth 2 (two) times daily., Disp: 60 tablet, Rfl: 3 .  losartan (COZAAR) 50 MG tablet, Take 1 tablet (50 mg total) by mouth daily., Disp: 90 tablet, Rfl: 3 .  metFORMIN (GLUCOPHAGE) 1000 MG tablet, Take 1 tablet (1,000 mg total) by mouth 2 (two) times daily with a meal., Disp: 60 tablet, Rfl: 1 .  metoprolol (LOPRESSOR) 50 MG tablet, Take 1 tablet (50 mg total) by mouth daily., Disp: 90 tablet, Rfl: 1 .  prednisoLONE acetate (PRED FORTE) 1 % ophthalmic suspension, , Disp: , Rfl:  .  Sennosides-Docusate Sodium (STOOL SOFTENER & LAXATIVE PO), Take 1 tablet by mouth daily as needed (constipation). , Disp: , Rfl:  .  simvastatin (ZOCOR) 20 MG tablet, TAKE 1 TABLET BY MOUTH EVERY DAY, Disp: 90 tablet, Rfl: 1 .  SYNTHROID 125 MCG tablet, TAKE 1 TABLET (125 MCG TOTAL) BY MOUTH DAILY BEFORE BREAKFAST., Disp: 30 tablet, Rfl: 3 .  venlafaxine (EFFEXOR) 37.5 MG tablet, TAKE 1 TABLET (37.5 MG TOTAL) BY MOUTH 2 (TWO) TIMES DAILY., Disp: 60 tablet, Rfl: 5 .  vitamin B-12 (CYANOCOBALAMIN) 1000 MCG tablet, Take 1,000 mcg by mouth daily., Disp: , Rfl:  .  erythromycin (ROMYCIN) ophthalmic ointment, Place 1 application into the left eye at bedtime., Disp: 3.5 g, Rfl: 0  EXAM:  Filed Vitals:   05/17/15 1436  BP: 138/71  Pulse: 89  Temp: 97.2 F (36.2 C)    Body mass index is 46.24 kg/(m^2).  GENERAL: vitals reviewed and listed above, alert, oriented, appears well hydrated and in no acute distress  HEENT: atraumatic, conjunttiva clear, no obvious abnormalities on inspection of external nose and ears  NECK: no obvious masses on  inspection  LUNGS: clear to auscultation bilaterally, no wheezes, rales or rhonchi, good air movement  CV: HRRR, no peripheral edema  MS: moves all extremities without noticeable abnormality  PSYCH: pleasant and cooperative, no obvious depression or anxiety  ASSESSMENT AND  PLAN:  Discussed the following assessment and plan:  Conjunctivitis of left eye - Plan: erythromycin (ROMYCIN) ophthalmic ointment -likely viral  Type 2 diabetes mellitus with other circulatory complications -changed to premixed insulin per her preference and financial issues -discussed risks and need for regular meals, a simplified regimen may be better for her -instructions explained and printed -close follow up in 2 weeks  -Patient advised to return or notify a doctor immediately if symptoms worsen or persist or new concerns arise.  Patient Instructions  Schedule appointment in 2 weeks.  FOR the eye: -put a small amount of the antibiotic ointment in the eye once nightly for 3 days -compresses several times per day -follow up if worsening or persists  Continue the Metformin  STOP the novolog and lantus (not taking)  Start humolog mix 75/25 quikpen 7 units twice daily with breakfast and with dinner only  CHECK BLOOD SUGAR fasting in the morning before breakfast and if running > 130 on average in 3 days, increase DINNER insulin by one unit. Titrate dinner insulin every three days to a goal of FASTING morning insulin < 130.  CHECK BLOOD SUGAR BEFORE dinner and if running > 130 on average after 7 days, titrate BREAKFAST insulin up 2 units. Titrate  BREAKFAST insulin every 7 days to a goal of predinner insulin < 130.  Follow up in 2 weeks and BRING blood sugar log     Jaslyn Bansal R.

## 2015-05-17 NOTE — Telephone Encounter (Signed)
Patient Name: Katie Mcintyre DOB: [birth year] Initial Comment Caller states she thinks she has pink eye Nurse Assessment Nurse: Marcelline Deist, RN, Lynda Date/Time (Eastern Time): 05/17/2015 11:41:52 AM Confirm and document reason for call. If symptomatic, describe symptoms. ---Caller states she thinks she has pink eye on left side. Has had runny nose, cold symptoms recently. Yesterday, her eye was sore & itchy, like something in it. Used eye gtts. Had some swelling of eyelid near the corner with white drainage. It is still pink. Took Benadryl last night. Not sure if fever, but has chills. Has the patient traveled out of the country within the last 30 days? ---Not Applicable Does the patient require triage? ---Yes Related visit to physician within the last 2 weeks? ---No Does the PT have any chronic conditions? (i.e. diabetes, asthma, etc.) ---Yes List chronic conditions. ---diabetic, BP rx Guidelines Guideline Title Affirmed Question Affirmed Notes Eye - Red Without Pus Eyelid is red and painful (or tender to touch) Final Disposition User See Physician within [place], RN, Kermit Balo Comments Nurse unable to schedule appt. for pt. Notified office & they will call pt. with her appt. time as time slots are relevant.

## 2015-05-17 NOTE — Patient Instructions (Addendum)
Schedule appointment in 2 weeks.  FOR the eye: -put a small amount of the antibiotic ointment in the eye once nightly for 3 days -compresses several times per day -follow up if worsening or persists  Continue the Metformin  STOP the novolog and lantus (not taking)  Start humolog mix 75/25 quikpen 7 units twice daily with breakfast and with dinner only  CHECK BLOOD SUGAR fasting in the morning before breakfast and if running > 130 on average in 3 days, increase DINNER insulin by one unit. Titrate dinner insulin every three days to a goal of FASTING morning insulin < 130.  CHECK BLOOD SUGAR BEFORE dinner and if running > 130 on average after 7 days, titrate BREAKFAST insulin up 2 units. Titrate  BREAKFAST insulin every 7 days to a goal of predinner insulin < 130.  Follow up in 2 weeks and BRING blood sugar log

## 2015-05-17 NOTE — Progress Notes (Signed)
Pre visit review using our clinic review tool, if applicable. No additional management support is needed unless otherwise documented below in the visit note. 

## 2015-05-21 ENCOUNTER — Telehealth: Payer: Self-pay | Admitting: *Deleted

## 2015-05-21 NOTE — Telephone Encounter (Signed)
Spoke with patient and her glucose readings are:  05/19/15 190 AM -  9 units              188 PM - 10 units 05/20/15 187 AM - 10 units              128 PM - 10 units 05/21/15 172 AM - early - had 2 cups of coffee              198 AM - 11 units

## 2015-05-28 ENCOUNTER — Ambulatory Visit: Payer: Medicare PPO | Admitting: Family Medicine

## 2015-06-01 ENCOUNTER — Encounter: Payer: Self-pay | Admitting: Family Medicine

## 2015-06-01 ENCOUNTER — Ambulatory Visit (INDEPENDENT_AMBULATORY_CARE_PROVIDER_SITE_OTHER): Payer: Medicare PPO | Admitting: Family Medicine

## 2015-06-01 VITALS — BP 138/68 | HR 72 | Temp 97.7°F | Ht 61.5 in | Wt 247.3 lb

## 2015-06-01 DIAGNOSIS — Z6841 Body Mass Index (BMI) 40.0 and over, adult: Secondary | ICD-10-CM

## 2015-06-01 DIAGNOSIS — E1159 Type 2 diabetes mellitus with other circulatory complications: Secondary | ICD-10-CM | POA: Diagnosis not present

## 2015-06-01 NOTE — Patient Instructions (Signed)
Cont the 10 units of insulin as you are doing and monitor fasting morning and predinner blood sugars for a goal of less then 130 on average  Continue to eat regular healthy small meals  Increase exercise

## 2015-06-01 NOTE — Progress Notes (Signed)
HPI:  DM ,uncontrolled: -circulatory complications -meds: metformin, asa, losartan, mixed humolog 75/25 -switched to mixed insulin 04/2015 -reports: doing 10 units of humolog twice daily and BS fasting morning and predinner log looks good - between 80-157 with most values under 130 -denies: hypoglycemia, vision issues, polyuria  ROS: See pertinent positives and negatives per HPI.  Past Medical History  Diagnosis Date  . Hypertension   . Hyperlipidemia   . Diabetes mellitus without complication   . Thyroid disease   . Gout 06/30/2013  . Depression 06/30/2013  . GERD (gastroesophageal reflux disease)   . Spinal stenosis of lumbar region     s/p spine surgery 02/12/13 with Dr Marcos Eke at [facility]  . Anemia   . Arthritis   . Cancer     cervical  . Chicken pox   . Diverticulitis   . Chronic bronchitis   . Frequent headaches   . Colon polyp   . Urine incontinence     Past Surgical History  Procedure Laterality Date  . Back surgery  01/2013  . Cervical cone biopsy    . Abdominal hysterectomy  1970    complete hysterectomy for cervical cancer  . Back surgery  1995  . Wrist surgery  2010    for wrist fracture    Family History  Problem Relation Age of Onset  . Alcohol abuse Father   . Alcohol abuse Brother   . Arthritis      parents  . Hyperlipidemia      parent  . Stroke      grandparent  . Hypertension      parent/grandparent  . Diabetes      parent/grandparent  . Breast cancer Sister   . Heart disease Other   . Mental illness Other   . Colon cancer Neg Hx   . Esophageal cancer Neg Hx   . Prostate cancer Neg Hx   . Rectal cancer Neg Hx     History   Social History  . Marital Status: Divorced    Spouse Name: N/A  . Number of Children: N/A  . Years of Education: N/A   Social History Main Topics  . Smoking status: Former Smoker    Quit date: 12/25/1964  . Smokeless tobacco: Never Used  . Alcohol Use: Yes     Comment: occ with steak dinner   . Drug Use:  No  . Sexual Activity: Not on file   Other Topics Concern  . 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: exercising on regular visit; trying to work on diet              Current outpatient prescriptions:  .  ACCU-CHEK FASTCLIX LANCETS MISC, Use as directed, Disp: 100 each, Rfl: 1 .  acetaminophen (TYLENOL) 500 MG tablet, Take 500 mg by mouth every 8 (eight) hours., Disp: , Rfl:  .  allopurinol (ZYLOPRIM) 300 MG tablet, TAKE 1 TABLET BY MOUTH ONCE DAILY, Disp: 30 tablet, Rfl: 0 .  amLODipine (NORVASC) 5 MG tablet, Take 1 tablet (5 mg total) by mouth daily., Disp: 90 tablet, Rfl: 3 .  aspirin 81 MG tablet, Take 81 mg by mouth daily., Disp: , Rfl:  .  B-D UF III MINI PEN NEEDLES 31G X 5 MM MISC, USE AS DIRECTED, Disp: 100 each, Rfl: 1 .  Blood Glucose Calibration (ACCU-CHEK AVIVA) SOLN, Use as directed., Disp: 1 each, Rfl: 1 .  calcium-vitamin D (OSCAL 500/200 D-3) 500-200 MG-UNIT per tablet, Take 1 tablet by mouth daily., Disp: , Rfl:  .  cloNIDine (CATAPRES) 0.1 MG tablet, Take 1 tablet (0.1 mg total) by mouth 2 (two) times daily., Disp: 180 tablet, Rfl: 1 .  ferrous sulfate 325 (65 FE) MG tablet, Take 325 mg by mouth daily with breakfast., Disp: , Rfl:  .  glucose blood (ACCU-CHEK SMARTVIEW) test strip, Use as instructed to check blood sugar twice a day, Disp: 100 each, Rfl: 1 .  hydrALAZINE (APRESOLINE) 25 MG tablet, Take 1 tablet (25 mg total) by mouth 2 (two) times daily., Disp: 60 tablet, Rfl: 3 .  insulin lispro protamine-lispro (HUMALOG 75/25 MIX) (75-25) 100 UNIT/ML SUSP injection, Inject 7 Units into the skin 2 (two) times daily with a meal., Disp: , Rfl:  .  isosorbide dinitrate (ISORDIL) 20 MG tablet, Take 1 tablet (20 mg total) by mouth 2 (two) times daily., Disp: 60 tablet, Rfl: 3 .  losartan (COZAAR) 50 MG tablet, Take 1 tablet (50 mg  total) by mouth daily., Disp: 90 tablet, Rfl: 3 .  metFORMIN (GLUCOPHAGE) 1000 MG tablet, Take 1 tablet (1,000 mg total) by mouth 2 (two) times daily with a meal., Disp: 60 tablet, Rfl: 1 .  metoprolol (LOPRESSOR) 50 MG tablet, Take 1 tablet (50 mg total) by mouth daily., Disp: 90 tablet, Rfl: 1 .  prednisoLONE acetate (PRED FORTE) 1 % ophthalmic suspension, , Disp: , Rfl:  .  Sennosides-Docusate Sodium (STOOL SOFTENER & LAXATIVE PO), Take 1 tablet by mouth daily as needed (constipation). , Disp: , Rfl:  .  simvastatin (ZOCOR) 20 MG tablet, TAKE 1 TABLET BY MOUTH EVERY DAY, Disp: 90 tablet, Rfl: 1 .  SYNTHROID 125 MCG tablet, TAKE 1 TABLET (125 MCG TOTAL) BY MOUTH DAILY BEFORE BREAKFAST., Disp: 30 tablet, Rfl: 3 .  venlafaxine (EFFEXOR) 37.5 MG tablet, TAKE 1 TABLET (37.5 MG TOTAL) BY MOUTH 2 (TWO) TIMES DAILY., Disp: 60 tablet, Rfl: 5 .  vitamin B-12 (CYANOCOBALAMIN) 1000 MCG tablet, Take 1,000 mcg by mouth daily., Disp: , Rfl:   EXAM:  Filed Vitals:   06/01/15 0906  BP: 138/68  Pulse: 72  Temp: 97.7 F (36.5 C)    Body mass index is 45.98 kg/(m^2).  GENERAL: vitals reviewed and listed above, alert, oriented, appears well hydrated and in no acute distress  HEENT: atraumatic, conjunttiva clear, no obvious abnormalities on inspection of external nose and ears  NECK: no obvious masses on inspection  LUNGS: clear to auscultation bilaterally, no wheezes, rales or rhonchi, good air movement  CV: HRRR, no peripheral edema  MS: moves all extremities without noticeable abnormality  PSYCH: pleasant and cooperative, no obvious depression or anxiety  ASSESSMENT AND PLAN:  Discussed the following assessment and plan:  Type 2 diabetes mellitus with other circulatory complications  BMI 46.5-68.1, adult  -lifestyle recs, she plans to go back to the [employer] -home BS is good, will check labs next appointemtn -Patient advised to return or notify a doctor immediately if symptoms worsen or  persist or new concerns arise.  There are no Patient Instructions on file for this visit.   Colin Benton R.

## 2015-06-01 NOTE — Progress Notes (Signed)
Pre visit review using our clinic review tool, if applicable. No additional management support is needed unless otherwise documented below in the visit note. 

## 2015-06-02 ENCOUNTER — Other Ambulatory Visit: Payer: Self-pay | Admitting: Family Medicine

## 2015-06-21 ENCOUNTER — Other Ambulatory Visit: Payer: Self-pay | Admitting: Family Medicine

## 2015-07-13 ENCOUNTER — Telehealth: Payer: Self-pay | Admitting: Family Medicine

## 2015-07-13 MED ORDER — GLUCOSE BLOOD VI STRP: ORAL_STRIP | Status: DC

## 2015-07-13 NOTE — Telephone Encounter (Signed)
Pt has one touch ultra glucometer and needs one touch ultra test strips blue#100 w.refills send to [address]

## 2015-07-13 NOTE — Telephone Encounter (Signed)
Rx done. 

## 2015-07-15 ENCOUNTER — Other Ambulatory Visit: Payer: Self-pay | Admitting: Family Medicine

## 2015-07-17 ENCOUNTER — Other Ambulatory Visit: Payer: Self-pay | Admitting: Family Medicine

## 2015-08-19 ENCOUNTER — Other Ambulatory Visit: Payer: Self-pay | Admitting: Family Medicine

## 2015-09-01 ENCOUNTER — Other Ambulatory Visit: Payer: Self-pay | Admitting: Family Medicine

## 2015-09-15 ENCOUNTER — Other Ambulatory Visit: Payer: Self-pay | Admitting: Family Medicine

## 2015-09-20 ENCOUNTER — Other Ambulatory Visit: Payer: Self-pay | Admitting: Family Medicine

## 2015-09-27 ENCOUNTER — Ambulatory Visit (INDEPENDENT_AMBULATORY_CARE_PROVIDER_SITE_OTHER): Payer: Medicare PPO | Admitting: Family Medicine

## 2015-09-27 ENCOUNTER — Encounter: Payer: Self-pay | Admitting: Family Medicine

## 2015-09-27 VITALS — BP 124/60 | HR 77 | Temp 97.5°F | Ht 61.5 in | Wt 242.2 lb

## 2015-09-27 DIAGNOSIS — E1159 Type 2 diabetes mellitus with other circulatory complications: Secondary | ICD-10-CM | POA: Diagnosis not present

## 2015-09-27 DIAGNOSIS — E785 Hyperlipidemia, unspecified: Secondary | ICD-10-CM | POA: Diagnosis not present

## 2015-09-27 DIAGNOSIS — Z23 Encounter for immunization: Secondary | ICD-10-CM | POA: Diagnosis not present

## 2015-09-27 DIAGNOSIS — E039 Hypothyroidism, unspecified: Secondary | ICD-10-CM | POA: Diagnosis not present

## 2015-09-27 DIAGNOSIS — I10 Essential (primary) hypertension: Secondary | ICD-10-CM

## 2015-09-27 DIAGNOSIS — F330 Major depressive disorder, recurrent, mild: Secondary | ICD-10-CM | POA: Diagnosis not present

## 2015-09-27 LAB — BASIC METABOLIC PANEL
BUN: 17 mg/dL (ref 6–23)
CALCIUM: 9 mg/dL (ref 8.4–10.5)
CHLORIDE: 102 meq/L (ref 96–112)
CO2: 26 mEq/L (ref 19–32)
CREATININE: 0.84 mg/dL (ref 0.40–1.20)
GFR: 84.89 mL/min (ref >60.00)
Glucose, Bld: 207 mg/dL — ABNORMAL HIGH (ref 70–99)
Potassium: 4 mEq/L (ref 3.5–5.1)
Sodium: 138 mEq/L (ref 135–145)

## 2015-09-27 LAB — HEMOGLOBIN A1C: HEMOGLOBIN A1C: 6.8 % — AB (ref 4.6–6.5)

## 2015-09-27 LAB — TSH: TSH: 81.56 u[IU]/mL — AB (ref 0.35–4.50)

## 2015-09-27 MED ORDER — INSULIN LISPRO PROT & LISPRO (75-25 MIX) 100 UNIT/ML ~~LOC~~ SUSP: 7.0000 [IU] | Freq: Two times a day (BID) | SUBCUTANEOUS | Status: DC

## 2015-09-27 MED ORDER — LEVOTHYROXINE SODIUM 125 MCG PO TABS: 125.0000 ug | ORAL_TABLET | Freq: Every day | ORAL | Status: DC

## 2015-09-27 NOTE — Progress Notes (Signed)
Pre visit review using our clinic review tool, if applicable. No additional management support is needed unless otherwise documented below in the visit note. 

## 2015-09-27 NOTE — Patient Instructions (Addendum)
BEFORE YOU LEAVE: -flu shot -labs -schedule follow up in 3 months  Take all of your medications every day  CALL your eye doctor today to chedule your yearly diabetic eye exam and please have your eye doctor forward result to [place].  We recommend the following healthy lifestyle measures: - eat a healthy whole foods diet consisting of regular small meals composed of vegetables, fruits, beans, nuts, seeds, healthy meats such as white chicken and fish and whole grains.  - avoid sweets, white starchy foods, fried foods, fast food, processed foods, sodas, red meet and other fattening foods.  - get a least 150-300 minutes of aerobic exercise per week.

## 2015-09-27 NOTE — Progress Notes (Signed)
HPI:  Note:Difficult to help her with her medical issues as she frequently does not come to appointments. She helps care for her children and her grandchildren and I suspect from what she tells me, has little time or energy leftover to care for herself, remember to take medications or come to appointments. She struggles with her finances and unfortunately reports at times has to make choices about buying meds or buying food.    DM: -circulatory complications -meds: metformin, asa, losartan, mixed humolog 75/25 -switched to mixed insulin 04/2015 -reports: doing 10 units of humolog twice daily and BS fasting morning and predinner log looks good - between 80-1302 with most values under 130 -denies: hypoglycemia, vision issues, polyuria -eye exam: sees Dr. Zadie Rhine, exam not done this year  HLD: -meds simvastatin 20 -stable -denies: leg cramps or cog impairment  HTN/Chronic LE edema: -meds: isosorbide, hydralazine, clonidine, metoprolol, losartan, norvasc - on these with prior pcp in combo meds that she could not afford -denies: CP, SOB, DOE, palpitiations -chronic LE edema intermittently - non-compliant with compression  Hypothyroid:  -ran out of her medication a few months ago  -denies: hot/cold intol, change in weight  Anemia:  Stable on iron therapy from prior PCP Had colonoscopy with polpys, repeat advised in 2017   Hx Gout: -on allopurinol from prior PCP  Depression: -meds: effexor 37.5 -denies: depression, panic, SI   ROS: See pertinent positives and negatives per HPI.  Past Medical History  Diagnosis Date  . Hypertension   . Hyperlipidemia   . Diabetes mellitus without complication ([place])   . Thyroid disease   . Gout 06/30/2013  . Depression 06/30/2013  . GERD (gastroesophageal reflux disease)   . Spinal stenosis of lumbar region     s/p spine surgery 02/12/13 with Dr Marcos Eke at [facility]  . Anemia   . Arthritis   . Cancer (HCC)     cervical  . Chicken pox   .  Diverticulitis   . Chronic bronchitis ([place])   . Frequent headaches   . Colon polyp   . Urine incontinence     Past Surgical History  Procedure Laterality Date  . Back surgery  01/2013  . Cervical cone biopsy    . Abdominal hysterectomy  1970    complete hysterectomy for cervical cancer  . Back surgery  1995  . Wrist surgery  2010    for wrist fracture    Family History  Problem Relation Age of Onset  . Alcohol abuse Father   . Alcohol abuse Brother   . Arthritis      parents  . Hyperlipidemia      parent  . Stroke      grandparent  . Hypertension      parent/grandparent  . Diabetes      parent/grandparent  . Breast cancer Sister   . Heart disease Other   . Mental illness Other   . Colon cancer Neg Hx   . Esophageal cancer Neg Hx   . Prostate cancer Neg Hx   . Rectal cancer Neg Hx     Social History   Social History  . Marital Status: Divorced    Spouse Name: N/A  . Number of Children: N/A  . Years of Education: N/A   Social History Main Topics  . Smoking status: Former Smoker    Quit date: 12/25/1964  . Smokeless tobacco: Never Used  . Alcohol Use: Yes     Comment: occ with steak dinner   . Drug  Use: No  . Sexual Activity: Not Asked   Other Topics Concern  . 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  ACCU-CHEK FASTCLIX LANCETS MISC, Use as directed, Disp: 100 each, Rfl: 1 .  acetaminophen (TYLENOL) 500 MG tablet, Take 500 mg by mouth every 8 (eight) hours., Disp: , Rfl:  .  allopurinol (ZYLOPRIM) 300 MG tablet, TAKE 1 TABLET BY MOUTH ONCE DAILY, Disp: 30 tablet, Rfl: 1 .  amLODipine (NORVASC) 5 MG tablet, Take 1 tablet (5 mg total) by mouth daily., Disp: 90 tablet, Rfl: 3 .  aspirin 81 MG tablet, Take 81  mg by mouth daily., Disp: , Rfl:  .  B-D UF III MINI PEN NEEDLES 31G X 5 MM MISC, USE AS DIRECTED, Disp: 100 each, Rfl: 1 .  Blood Glucose Calibration (ACCU-CHEK AVIVA) SOLN, Use as directed., Disp: 1 each, Rfl: 1 .  calcium-vitamin D (OSCAL 500/200 D-3) 500-200 MG-UNIT per tablet, Take 1 tablet by mouth daily., Disp: , Rfl:  .  cloNIDine (CATAPRES) 0.1 MG tablet, TAKE 1 TABLET BY MOUTH TWICE A DAY, Disp: 60 tablet, Rfl: 0 .  ferrous sulfate 325 (65 FE) MG tablet, Take 325 mg by mouth daily with breakfast., Disp: , Rfl:  .  glucose blood (ACCU-CHEK SMARTVIEW) test strip, Use as instructed to check blood sugar twice a day, Disp: 100 each, Rfl: 1 .  hydrALAZINE (APRESOLINE) 25 MG tablet, TAKE 1 TABLET (25 MG TOTAL) BY MOUTH 2 (TWO) TIMES DAILY., Disp: 60 tablet, Rfl: 3 .  insulin lispro protamine-lispro (HUMALOG 75/25 MIX) (75-25) 100 UNIT/ML SUSP injection, Inject 7 Units into the skin 2 (two) times daily with a meal., Disp: 5 vial, Rfl: 3 .  isosorbide dinitrate (ISORDIL) 20 MG tablet, TAKE 1 TABLET (20 MG TOTAL) BY MOUTH 2 (TWO) TIMES DAILY., Disp: 60 tablet, Rfl: 3 .  losartan (COZAAR) 50 MG tablet, Take 1 tablet (50 mg total) by mouth daily., Disp: 90 tablet, Rfl: 3 .  metFORMIN (GLUCOPHAGE) 1000 MG tablet, TAKE 1 TABLET (1,000 MG TOTAL) BY MOUTH 2 (TWO) TIMES DAILY WITH A MEAL., Disp: 180 tablet, Rfl: 0 .  metoprolol (LOPRESSOR) 50 MG tablet, TAKE 1 TABLET BY MOUTH ONCE DAILY., Disp: 30 tablet, Rfl: 0 .  prednisoLONE acetate (PRED FORTE) 1 % ophthalmic suspension, , Disp: , Rfl:  .  Sennosides-Docusate Sodium (STOOL SOFTENER & LAXATIVE PO), Take 1 tablet by mouth daily as needed (constipation). , Disp: , Rfl:  .  simvastatin (ZOCOR) 20 MG tablet, TAKE 1 TABLET BY MOUTH EVERY DAY, Disp: 90 tablet, Rfl: 1 .  venlafaxine (EFFEXOR) 37.5 MG tablet, TAKE 1 TABLET BY MOUTH TWICE A DAY, Disp: 60 tablet, Rfl: 3 .  vitamin B-12 (CYANOCOBALAMIN) 1000 MCG tablet, Take 1,000 mcg by mouth daily., Disp: , Rfl:   .  levothyroxine (SYNTHROID, LEVOTHROID) 125 MCG tablet, Take 1 tablet (125 mcg total) by mouth daily., Disp: 90 tablet, Rfl: 3  EXAM:  Filed Vitals:   09/27/15 1304  BP: 124/60  Pulse: 77  Temp: 97.5 F (36.4 C)    Body mass index is 45.03 kg/(m^2).  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  Patient Instructions  BEFORE YOU LEAVE: -flu shot -labs -schedule follow up in 3 months  Take all of your medications every day  CALL your eye doctor today to chedule your yearly diabetic eye exam and please have your eye doctor forward result to [place].  We recommend the following healthy lifestyle measures: - eat a healthy whole foods diet consisting of regular small meals composed of vegetables, fruits, beans, nuts, seeds, healthy meats such as white chicken and fish and whole grains.  - avoid sweets, white starchy foods, fried foods, fast food, processed foods, sodas, red meet and other fattening foods.  - get a least  150-300 minutes of aerobic exercise per week.       Colin Benton R.

## 2015-10-02 ENCOUNTER — Other Ambulatory Visit: Payer: Self-pay | Admitting: Family Medicine

## 2015-10-20 ENCOUNTER — Telehealth: Payer: Self-pay | Admitting: Family Medicine

## 2015-10-20 NOTE — Telephone Encounter (Signed)
Pt said she does not use the following med insulin lispro protamine-lispro (HUMALOG 75/25 MIX) (75-25) 100 UNIT/ML SUSP injection  She said she use the Humalog flex pen and would like that to be called in to the below [place]

## 2015-10-21 MED ORDER — INSULIN LISPRO PROT & LISPRO (75-25 MIX) 100 UNIT/ML KWIKPEN: 10.0000 [IU] | PEN_INJECTOR | Freq: Two times a day (BID) | SUBCUTANEOUS | Status: DC

## 2015-11-01 ENCOUNTER — Other Ambulatory Visit: Payer: Self-pay | Admitting: Family Medicine

## 2015-11-14 ENCOUNTER — Other Ambulatory Visit: Payer: Self-pay | Admitting: Family Medicine

## 2015-11-16 ENCOUNTER — Other Ambulatory Visit: Payer: Self-pay | Admitting: Family Medicine

## 2015-12-06 ENCOUNTER — Other Ambulatory Visit: Payer: Self-pay | Admitting: Family Medicine

## 2016-01-04 ENCOUNTER — Ambulatory Visit: Payer: Medicare PPO | Admitting: Family Medicine

## 2016-01-11 ENCOUNTER — Ambulatory Visit (INDEPENDENT_AMBULATORY_CARE_PROVIDER_SITE_OTHER): Payer: Medicare Other | Admitting: Family Medicine

## 2016-01-11 ENCOUNTER — Encounter: Payer: Self-pay | Admitting: Family Medicine

## 2016-01-11 VITALS — BP 136/76 | HR 51 | Temp 98.1°F | Ht 61.5 in | Wt 243.2 lb

## 2016-01-11 DIAGNOSIS — R55 Syncope and collapse: Secondary | ICD-10-CM

## 2016-01-11 DIAGNOSIS — R0789 Other chest pain: Secondary | ICD-10-CM

## 2016-01-11 DIAGNOSIS — E785 Hyperlipidemia, unspecified: Secondary | ICD-10-CM

## 2016-01-11 DIAGNOSIS — R001 Bradycardia, unspecified: Secondary | ICD-10-CM

## 2016-01-11 DIAGNOSIS — I10 Essential (primary) hypertension: Secondary | ICD-10-CM | POA: Diagnosis not present

## 2016-01-11 DIAGNOSIS — Z794 Long term (current) use of insulin: Secondary | ICD-10-CM

## 2016-01-11 DIAGNOSIS — D509 Iron deficiency anemia, unspecified: Secondary | ICD-10-CM

## 2016-01-11 DIAGNOSIS — Z860100 Personal history of colon polyps, unspecified: Secondary | ICD-10-CM

## 2016-01-11 DIAGNOSIS — R400 Somnolence: Secondary | ICD-10-CM

## 2016-01-11 DIAGNOSIS — E1159 Type 2 diabetes mellitus with other circulatory complications: Secondary | ICD-10-CM | POA: Diagnosis not present

## 2016-01-11 DIAGNOSIS — E039 Hypothyroidism, unspecified: Secondary | ICD-10-CM

## 2016-01-11 DIAGNOSIS — R0683 Snoring: Secondary | ICD-10-CM

## 2016-01-11 LAB — CBC WITH DIFFERENTIAL/PLATELET
BASOS ABS: 0 10*3/uL (ref 0.0–0.1)
Basophils Relative: 0.7 % (ref 0.0–3.0)
EOS ABS: 0.4 10*3/uL (ref 0.0–0.7)
Eosinophils Relative: 7.6 % — ABNORMAL HIGH (ref 0.0–5.0)
HCT: 41.3 % (ref 36.0–46.0)
Hemoglobin: 13.4 g/dL (ref 12.0–15.0)
Lymphocytes Relative: 35.6 % (ref 12.0–46.0)
Lymphs Abs: 2 10*3/uL (ref 0.7–4.0)
MCHC: 32.4 g/dL (ref 30.0–36.0)
MCV: 90.9 fl (ref 78.0–100.0)
MONO ABS: 0.4 10*3/uL (ref 0.1–1.0)
Monocytes Relative: 7 % (ref 3.0–12.0)
NEUTROS ABS: 2.8 10*3/uL (ref 1.4–7.7)
NEUTROS PCT: 49.1 % (ref 43.0–77.0)
PLATELETS: 319 10*3/uL (ref 150.0–400.0)
RBC: 4.55 Mil/uL (ref 3.87–5.11)
RDW: 13.8 % (ref 11.5–15.5)
WBC: 5.7 10*3/uL (ref 4.0–10.5)

## 2016-01-11 LAB — BASIC METABOLIC PANEL
BUN: 18 mg/dL (ref 6–23)
CALCIUM: 9.6 mg/dL (ref 8.4–10.5)
CO2: 28 meq/L (ref 19–32)
CREATININE: 0.96 mg/dL (ref 0.40–1.20)
Chloride: 100 mEq/L (ref 96–112)
GFR: 72.71 mL/min (ref >60.00)
Glucose, Bld: 87 mg/dL (ref 70–99)
Potassium: 4.2 mEq/L (ref 3.5–5.1)
Sodium: 138 mEq/L (ref 135–145)

## 2016-01-11 LAB — HEMOGLOBIN A1C: HEMOGLOBIN A1C: 6.8 % — AB (ref 4.6–6.5)

## 2016-01-11 LAB — TSH: TSH: 24.59 u[IU]/mL — AB (ref 0.35–4.50)

## 2016-01-11 NOTE — Patient Instructions (Addendum)
BEFORE YOU LEAVE: -EKG -labs -follow up in 3 months  -We placed a referral for you as discussed. It usually takes about 1-2 weeks to process and schedule this referral. If you have not heard from [place] regarding this appointment in 2 weeks please contact our office.  -no driving until evaluated for these episodes  -please schedule a diabetic eye exam  -Reduce insulin to 10 units humolog bid  -reduce metoprolol to 1/2 tablet daily for 1 week then 1/2 tablet every other day for 1 week then stop

## 2016-01-11 NOTE — Progress Notes (Signed)
HPI:  Note:Difficult to help her with her medical issues as she frequently does not come to appointments. She helps care for her children and her grandchildren and I suspect from what she tells me, has little time or energy leftover to care for herself, remember to take medications or come to appointments. She struggles with her finances and unfortunately reports at times has to make choices about buying meds or buying food.    New issue:  Pre-syncope: -occurred once several years ago the twice 2 weeks ago -brief episodes of feeling like she is about to pass out -was stressed with family issues -all occurred while driving -denies and associated: SOB, sweating, nausea, CP, palpitations -does admit to intermittent spells of substernal CP usually after meals the last several months, sometime pain in jaw bilat - does not seem occur with activity -hx daytime fatigue and snoring -on review OSH records hx abnormal EKG with cardiology eval in the past with echo and stress test, [facility] in the past per notes  DM: -circulatory complications -meds: metformin, asa, losartan, mixed humolog 75/25 -switched to mixed insulin 04/2015 -reports: doing 12 units of humolog twice daily since episodes though log does not show any high or lows BS and BS fasting morning and predinner log looks good - between 80-130 with most values under 130, and postprandial high of 157 -denies: hypoglycemia, vision issues, polyuria -eye exam: sees Dr. Zadie Rhine, exam not done this year  HLD: -meds simvastatin 20 -stable -denies: leg cramps or cog impairment  HTN/Chronic LE edema: -meds: isosorbide, hydralazine, clonidine, metoprolol, losartan, norvasc - on these with prior pcp in combo meds that she could not afford -denies: CP, SOB, DOE, palpitiations -chronic LE edema intermittently - non-compliant with compression  Hypothyroid:  -ran out of her medication a few months ago - restarted and advised 6 week recheck - she did  not follow up for recheck -reports: -denies: hot/cold intol, change in weight  Anemia:  Stable on iron therapy from prior PCP Had colonoscopy with polpys, repeat advised in 08/2016   Hx Gout: -on allopurinol from prior PCP  Depression: -meds: effexor 37.5 -denies: depression, panic, SI  ROS: See pertinent positives and negatives per HPI.  Past Medical History  Diagnosis Date  . Hypertension   . Hyperlipidemia   . Diabetes mellitus without complication ([place])   . Thyroid disease   . Gout 06/30/2013  . Depression 06/30/2013  . GERD (gastroesophageal reflux disease)   . Spinal stenosis of lumbar region     s/p spine surgery 02/12/13 with Dr Marcos Eke at [facility]  . Anemia   . Arthritis   . Cancer (HCC)     cervical  . Chicken pox   . Diverticulitis   . Chronic bronchitis ([place])   . Frequent headaches   . Colon polyp   . Urine incontinence     Past Surgical History  Procedure Laterality Date  . Back surgery  01/2013  . Cervical cone biopsy    . Abdominal hysterectomy  1970    complete hysterectomy for cervical cancer  . Back surgery  1995  . Wrist surgery  2010    for wrist fracture    Family History  Problem Relation Age of Onset  . Alcohol abuse Father   . Alcohol abuse Brother   . Arthritis      parents  . Hyperlipidemia      parent  . Stroke      grandparent  . Hypertension  parent/grandparent  . Diabetes      parent/grandparent  . Breast cancer Sister   . Heart disease Other   . Mental illness Other   . Colon cancer Neg Hx   . Esophageal cancer Neg Hx   . Prostate cancer Neg Hx   . Rectal cancer Neg Hx     Social History   Social History  . Marital Status: Divorced    Spouse Name: N/A  . Number of Children: N/A  . Years of Education: N/A   Social History Main Topics  . Smoking status: Former Smoker    Quit date: 12/25/1964  . Smokeless tobacco: Never Used  . Alcohol Use: Yes     Comment: occ with steak dinner   . Drug Use: No  .  Sexual Activity: Not Asked   Other Topics Concern  . 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  ACCU-CHEK FASTCLIX LANCETS MISC, Use as directed, Disp: 100 each, Rfl: 1 .  acetaminophen (TYLENOL) 500 MG tablet, Take 500 mg by mouth every 8 (eight) hours., Disp: , Rfl:  .  allopurinol (ZYLOPRIM) 300 MG tablet, TAKE 1 TABLET BY MOUTH ONCE DAILY, Disp: 30 tablet, Rfl: 1 .  amLODipine (NORVASC) 5 MG tablet, Take 1 tablet (5 mg total) by mouth daily., Disp: 90 tablet, Rfl: 3 .  aspirin 81 MG tablet, Take 81 mg by mouth daily., Disp: , Rfl:  .  B-D UF III MINI PEN NEEDLES 31G X 5 MM MISC, USE AS DIRECTED, Disp: 100 each, Rfl: 1 .  Blood Glucose Calibration (ACCU-CHEK AVIVA) SOLN, Use as directed., Disp: 1 each, Rfl: 1 .  calcium-vitamin D (OSCAL 500/200 D-3) 500-200 MG-UNIT per tablet, Take 1 tablet by mouth daily., Disp: , Rfl:  .  cloNIDine (CATAPRES) 0.1 MG tablet, TAKE 1 TABLET BY MOUTH TWICE A DAY, Disp: 60 tablet, Rfl: 0 .  ferrous sulfate 325 (65 FE) MG tablet, Take 325 mg by mouth daily with breakfast., Disp: , Rfl:  .  glucose blood (ACCU-CHEK SMARTVIEW) test strip, Use as instructed to check blood sugar twice a day, Disp: 100 each, Rfl: 1 .  hydrALAZINE (APRESOLINE) 25 MG tablet, TAKE 1 TABLET (25 MG TOTAL) BY MOUTH 2 (TWO) TIMES DAILY., Disp: 60 tablet, Rfl: 3 .  Insulin Lispro Prot & Lispro (HUMALOG MIX 75/25 KWIKPEN) (75-25) 100 UNIT/ML Kwikpen, Inject 10 Units into the skin 2 (two) times daily before a meal., Disp: 15 mL, Rfl: 11 .  isosorbide dinitrate (ISORDIL) 20 MG tablet, TAKE 1 TABLET (20 MG TOTAL) BY MOUTH 2 (TWO) TIMES DAILY., Disp: 60 tablet, Rfl: 3 .  isosorbide dinitrate (ISORDIL) 20 MG tablet, TAKE 1 TABLET (20 MG TOTAL) BY  MOUTH 2 (TWO) TIMES DAILY., Disp: 60 tablet, Rfl: 3 .  levothyroxine (SYNTHROID, LEVOTHROID) 125 MCG tablet, Take 1 tablet (125 mcg total) by mouth daily., Disp: 90 tablet, Rfl: 3 .  losartan (COZAAR) 50 MG tablet, Take 1 tablet (50 mg total) by mouth daily., Disp: 90 tablet, Rfl: 3 .  metFORMIN (GLUCOPHAGE) 1000 MG tablet, TAKE 1 TABLET (1,000 MG TOTAL) BY MOUTH 2 (TWO) TIMES DAILY WITH A MEAL., Disp: 180 tablet,  Rfl: 1 .  metoprolol (LOPRESSOR) 50 MG tablet, TAKE 1 TABLET BY MOUTH ONCE DAILY., Disp: 30 tablet, Rfl: 0 .  prednisoLONE acetate (PRED FORTE) 1 % ophthalmic suspension, , Disp: , Rfl:  .  Sennosides-Docusate Sodium (STOOL SOFTENER & LAXATIVE PO), Take 1 tablet by mouth daily as needed (constipation). , Disp: , Rfl:  .  simvastatin (ZOCOR) 20 MG tablet, TAKE 1 TABLET BY MOUTH EVERY DAY, Disp: 90 tablet, Rfl: 1 .  venlafaxine (EFFEXOR) 37.5 MG tablet, TAKE 1 TABLET BY MOUTH TWICE A DAY, Disp: 60 tablet, Rfl: 3 .  vitamin B-12 (CYANOCOBALAMIN) 1000 MCG tablet, Take 1,000 mcg by mouth daily., Disp: , Rfl:   EXAM:  Filed Vitals:   01/11/16 1432  BP: 136/76  Pulse: 51  Temp: 98.1 F (36.7 C)    Body mass index is 45.21 kg/(m^2).  GENERAL: vitals reviewed and listed above, alert, oriented, appears well hydrated and in no acute distress  HEENT: atraumatic, conjunttiva clear, L eyelid droop (since childhood - chronic) no obvious abnormalities on inspection of external nose and ears  NECK: no obvious masses on inspection  LUNGS: clear to auscultation bilaterally, no wheezes, rales or rhonchi, good air movement  CV: HRRR, no peripheral edema  MS: moves all extremities without noticeable abnormality  PSYCH: pleasant and cooperative, no obvious depression or anxiety  NEURO: finger to nose normal, CN II-XII grossly intact, speech and thought processing grossly intact  ASSESSMENT AND PLAN: >40 minutes spent with this very complicated pt with > [id number] face to face in  consultation. Discussed the following assessment and plan:  Type 2 diabetes mellitus with other circulatory complication, with long-term current use of insulin (HCC) - Plan: Hemoglobin A1c  Hyperlipemia  Essential hypertension, benign - Plan: Basic metabolic panel  Hypothyroidism, unspecified hypothyroidism type - Plan: TSH  Iron deficiency anemia  Pre-syncope - Plan: CBC with Differential, EKG 12-Lead  Atypical chest pain  History of colonic polyps - repeat colonoscopy due in 08/2016  -EKG with bradycardia and nstwc w/out acute changes and cardiology referral for presyncope, bradycardia and atypical CP - eval several years ago for abn EKG and has had stress and echo per outside records several years ago, no symptoms currently -in interim taper off metop advised -labs and sleep consult as well -may need neuro eval if unrevealing, particularly if any further issues -go back to 10 units insulin - no known lows per pt -Patient advised to return or notify a doctor immediately if symptoms worsen or persist or new concerns arise.  Patient Instructions  BEFORE YOU LEAVE: -EKG -labs -follow up in 3 months  -We placed a referral for you as discussed. It usually takes about 1-2 weeks to process and schedule this referral. If you have not heard from [place] regarding this appointment in 2 weeks please contact our office.  -no driving until evaluated for these episodes  -please schedule a diabetic eye exam  -Reduce insulin to 10 units humolog bid     Christepher Melchior R.

## 2016-01-11 NOTE — Progress Notes (Signed)
Pre visit review using our clinic review tool, if applicable. No additional management support is needed unless otherwise documented below in the visit note. 

## 2016-01-13 MED ORDER — LEVOTHYROXINE SODIUM 137 MCG PO TABS: 137.0000 ug | ORAL_TABLET | Freq: Every day | ORAL | Status: DC

## 2016-01-13 NOTE — Addendum Note (Signed)
Addended by: Lahoma Crocker A on: 01/13/2016 11:59 AM   Modules accepted: Orders

## 2016-01-13 NOTE — Addendum Note (Signed)
Addended by: Agnes Lawrence on: 01/13/2016 11:28 AM   Modules accepted: Orders, Medications

## 2016-01-18 ENCOUNTER — Other Ambulatory Visit: Payer: Self-pay | Admitting: Family Medicine

## 2016-02-02 ENCOUNTER — Encounter: Payer: Self-pay | Admitting: Internal Medicine

## 2016-02-02 ENCOUNTER — Ambulatory Visit (INDEPENDENT_AMBULATORY_CARE_PROVIDER_SITE_OTHER): Payer: Medicare Other | Admitting: Internal Medicine

## 2016-02-02 VITALS — BP 150/64 | HR 88 | Ht 61.0 in | Wt 243.0 lb

## 2016-02-02 DIAGNOSIS — R001 Bradycardia, unspecified: Secondary | ICD-10-CM | POA: Diagnosis not present

## 2016-02-02 DIAGNOSIS — R55 Syncope and collapse: Secondary | ICD-10-CM | POA: Diagnosis not present

## 2016-02-02 DIAGNOSIS — I10 Essential (primary) hypertension: Secondary | ICD-10-CM | POA: Diagnosis not present

## 2016-02-02 DIAGNOSIS — R0602 Shortness of breath: Secondary | ICD-10-CM | POA: Insufficient documentation

## 2016-02-02 DIAGNOSIS — R0609 Other forms of dyspnea: Secondary | ICD-10-CM | POA: Diagnosis not present

## 2016-02-02 DIAGNOSIS — R0600 Dyspnea, unspecified: Secondary | ICD-10-CM

## 2016-02-02 NOTE — Patient Instructions (Signed)
Your physician has requested that you have an echocardiogram. Echocardiography is a painless test that uses sound waves to create images of your heart. It provides your doctor with information about the size and shape of your heart and how well your heart's chambers and valves are working. This procedure takes approximately one hour. There are no restrictions for this procedure.   Your physician has requested that you have a carotid duplex. This test is an ultrasound of the carotid arteries in your neck. It looks at blood flow through these arteries that supply the brain with blood. Allow one hour for this exam. There are no restrictions or special instructions.  Dr Debara Pickett has ordered for you to have a Loop recorder implanted. This will be done by Dr Rene Paci and will be done as a outpatient at [facility].  Your physician recommends that you schedule a follow-up appointment after tests.

## 2016-02-02 NOTE — Progress Notes (Signed)
OFFICE NOTE  Chief Complaint:  Syncope, DOE, bradycardia  Primary Care Physician: [name]., DO  HPI:  Katie Mcintyre is a pleasant 75 year old female with a number of cardiovascular risk factors including insulin-dependent diabetes, dyslipidemia, hypertension and morbid obesity. She recently described a couple of episodes when she was driving of presyncope/syncope. She was able to maintain control of the car but basically started to lose consciousness and was in a very afraid. She does not believe this is related to blood sugars although she said that she personally increased her Lantus around that time thinking that her blood sugars my been too high. Her primary care provider then decrease that. She denies any symptoms of heart racing or chest pain. She has reported some shortness of breath which is been progressively worse. An EKG in her primary care provider's office shows sinus bradycardia, left atrial enlargement and voltage criteria for LVH with nonspecific T-wave changes. Heart rate is 50.   PMHx:  Past Medical History  Diagnosis Date  . Hypertension   . Hyperlipidemia   . Diabetes mellitus without complication ([place])   . Thyroid disease   . Gout 06/30/2013  . Depression 06/30/2013  . GERD (gastroesophageal reflux disease)   . Spinal stenosis of lumbar region     s/p spine surgery 02/12/13 with Dr Marcos Eke at [facility]  . Anemia   . Arthritis   . Cancer (HCC)     cervical  . Chicken pox   . Diverticulitis   . Chronic bronchitis ([place])   . Frequent headaches   . Colon polyp   . Urine incontinence     Past Surgical History  Procedure Laterality Date  . Back surgery  01/2013  . Cervical cone biopsy    . Abdominal hysterectomy  1970    complete hysterectomy for cervical cancer  . Back surgery  1995  . Wrist surgery  2010    for wrist fracture    FAMHx:  Family History  Problem Relation Age of Onset  . Alcohol abuse Father   . Alcohol abuse Brother   . Arthritis       parents  . Hyperlipidemia      parent  . Stroke      grandparent  . Hypertension      parent/grandparent  . Diabetes      parent/grandparent  . Breast cancer Sister   . Heart disease Other   . Mental illness Other   . Colon cancer Neg Hx   . Esophageal cancer Neg Hx   . Prostate cancer Neg Hx   . Rectal cancer Neg Hx     SOCHx:   reports that she quit smoking about 51 years ago. She has never used smokeless tobacco. She reports that she drinks alcohol. She reports that she does not use illicit drugs.  ALLERGIES:  Allergies  Allergen Reactions  . Advil [Ibuprofen]     Was taking this for pain and had some ? Swollen lips  . Lyrica [Pregabalin]     Extreme swelling legs    ROS: Pertinent items noted in HPI and remainder of comprehensive ROS otherwise negative.  HOME MEDS: Current Outpatient Prescriptions  Medication Sig Dispense Refill  . ACCU-CHEK FASTCLIX LANCETS MISC Use as directed 100 each 1  . acetaminophen (TYLENOL) 500 MG tablet Take 500 mg by mouth every 8 (eight) hours.    Marland Kitchen allopurinol (ZYLOPRIM) 300 MG tablet TAKE 1 TABLET BY MOUTH ONCE DAILY 30 tablet 1  .  amLODipine (NORVASC) 5 MG tablet Take 1 tablet (5 mg total) by mouth daily. 90 tablet 3  . aspirin 81 MG tablet Take 81 mg by mouth daily.    . B-D UF III MINI PEN NEEDLES 31G X 5 MM MISC USE AS DIRECTED 100 each 1  . Blood Glucose Calibration (ACCU-CHEK AVIVA) SOLN Use as directed. 1 each 1  . calcium-vitamin D (OSCAL 500/200 D-3) 500-200 MG-UNIT per tablet Take 1 tablet by mouth daily.    . cloNIDine (CATAPRES) 0.1 MG tablet TAKE 1 TABLET BY MOUTH TWICE A DAY 60 tablet 2  . ferrous sulfate 325 (65 FE) MG tablet Take 325 mg by mouth daily with breakfast.    . glucose blood (ACCU-CHEK SMARTVIEW) test strip Use as instructed to check blood sugar twice a day 100 each 1  . hydrALAZINE (APRESOLINE) 25 MG tablet TAKE 1 TABLET (25 MG TOTAL) BY MOUTH 2 (TWO) TIMES DAILY. 60 tablet 3  . Insulin Lispro Prot &  Lispro (HUMALOG MIX 75/25 KWIKPEN) (75-25) 100 UNIT/ML Kwikpen Inject 10 Units into the skin 2 (two) times daily before a meal. 15 mL 11  . isosorbide dinitrate (ISORDIL) 20 MG tablet TAKE 1 TABLET (20 MG TOTAL) BY MOUTH 2 (TWO) TIMES DAILY. 60 tablet 3  . isosorbide dinitrate (ISORDIL) 20 MG tablet TAKE 1 TABLET (20 MG TOTAL) BY MOUTH 2 (TWO) TIMES DAILY. 60 tablet 3  . levothyroxine (SYNTHROID, LEVOTHROID) 137 MCG tablet Take 1 tablet (137 mcg total) by mouth daily before breakfast. 90 tablet 0  . losartan (COZAAR) 50 MG tablet Take 1 tablet (50 mg total) by mouth daily. 90 tablet 3  . metFORMIN (GLUCOPHAGE) 1000 MG tablet TAKE 1 TABLET (1,000 MG TOTAL) BY MOUTH 2 (TWO) TIMES DAILY WITH A MEAL. 180 tablet 1  . Sennosides-Docusate Sodium (STOOL SOFTENER & LAXATIVE PO) Take 1 tablet by mouth daily as needed (constipation).     . simvastatin (ZOCOR) 20 MG tablet TAKE 1 TABLET BY MOUTH EVERY DAY 90 tablet 1  . venlafaxine (EFFEXOR) 37.5 MG tablet TAKE 1 TABLET BY MOUTH TWICE A DAY 60 tablet 3  . vitamin B-12 (CYANOCOBALAMIN) 1000 MCG tablet Take 1,000 mcg by mouth daily.     No current facility-administered medications for this visit.    LABS/IMAGING: No results found for this or any previous visit (from the past 48 hour(s)). No results found.  WEIGHTS: Wt Readings from Last 3 Encounters:  02/02/16 243 lb (110.224 kg)  01/11/16 243 lb 3.2 oz (110.315 kg)  09/27/15 242 lb 3.2 oz (109.861 kg)    VITALS: BP 150/64 mmHg  Pulse 88  Ht 5\' 1"  (1.549 m)  Wt 243 lb (110.224 kg)  BMI 45.94 kg/m2  EXAM: General appearance: alert, no distress and morbidly obese Neck: no carotid bruit and no JVD Lungs: clear to auscultation bilaterally Heart: regular rate and rhythm, S1, S2 normal, no murmur, click, rub or gallop Abdomen: soft, non-tender; bowel sounds normal; no masses,  no organomegaly Extremities: extremities normal, atraumatic, no cyanosis or edema Pulses: 2+ and symmetric Skin: Skin  color, texture, turgor normal. No rashes or lesions Neurologic: Grossly normal Psych: Pleasant  EKG: EKG from her primary provider's office personally reviewed-sinus bradycardia at 50, left atrial enlargement, moderate voltage criteria for LVH, nonspecific T-wave changes  ASSESSMENT: 1. Presyncope/syncope 2. Bradycardia 3. Progressive dyspnea and exertion 4. Morbid obesity 5. Hypertension 6. Dyslipidemia 7. Insulin-dependent diabetes  PLAN: 1.   Mrs.Sweetin is had a few episodes of recent presyncope/syncope which  has been worsening. She's also had some shortness of breath and has EKG changes suggesting possible structural changes in her heart. She's had long-standing hypertension and recently was noted to be bradycardic. I like to obtain an echocardiogram to assess her shortness of breath and syncopal causes. In addition will get carotid Dopplers. Given her bradycardia possibly this could be arrhythmia genetic in nature, coupled with the fact that these episodes may be very infrequent and difficult to pick up on. I'm recommending an implanted loop recorder device. This could potentially pick up an episode at some point over the next several years. The other possibility of her syncope could be related to hypoglycemia. She has had one episode of hypoglycemia in the past for which EMS responded and her blood sugar was about 30. Although, her primary care provider did not feel that this was related to low blood sugar.  Plan to see her back after the imaging studies are completed and she's had a loop recorder implant.  Thanks for the kind referral.  Pixie Casino, MD, [facility] Attending Cardiologist [place] 02/02/2016, 5:30 PM

## 2016-02-03 ENCOUNTER — Encounter: Payer: Self-pay | Admitting: Internal Medicine

## 2016-02-05 ENCOUNTER — Other Ambulatory Visit: Payer: Self-pay | Admitting: Family Medicine

## 2016-02-07 ENCOUNTER — Ambulatory Visit (HOSPITAL_COMMUNITY)
Admission: RE | Admit: 2016-02-07 | Discharge: 2016-02-07 | Disposition: A | Discharge status: Home / Self Care | Payer: Medicare Other | Source: Ambulatory Visit | Attending: Cardiology | Admitting: Cardiology

## 2016-02-07 DIAGNOSIS — E119 Type 2 diabetes mellitus without complications: Secondary | ICD-10-CM | POA: Diagnosis not present

## 2016-02-07 DIAGNOSIS — I6523 Occlusion and stenosis of bilateral carotid arteries: Secondary | ICD-10-CM | POA: Insufficient documentation

## 2016-02-07 DIAGNOSIS — R55 Syncope and collapse: Secondary | ICD-10-CM | POA: Diagnosis not present

## 2016-02-07 DIAGNOSIS — E785 Hyperlipidemia, unspecified: Secondary | ICD-10-CM | POA: Insufficient documentation

## 2016-02-07 DIAGNOSIS — I10 Essential (primary) hypertension: Secondary | ICD-10-CM | POA: Insufficient documentation

## 2016-02-10 ENCOUNTER — Other Ambulatory Visit: Payer: Self-pay | Admitting: *Deleted

## 2016-02-15 ENCOUNTER — Other Ambulatory Visit: Payer: Self-pay | Admitting: Family Medicine

## 2016-02-15 ENCOUNTER — Ambulatory Visit (HOSPITAL_COMMUNITY)
Admission: RE | Admit: 2016-02-15 | Discharge: 2016-02-15 | Disposition: A | Discharge status: Home / Self Care | Payer: Medicare Other | Source: Ambulatory Visit | Attending: Cardiovascular Disease | Admitting: Cardiovascular Disease

## 2016-02-15 ENCOUNTER — Encounter (HOSPITAL_COMMUNITY)
Admission: RE | Disposition: A | Discharge status: Home / Self Care | Payer: Self-pay | Source: Ambulatory Visit | Attending: Cardiovascular Disease

## 2016-02-15 DIAGNOSIS — Z7982 Long term (current) use of aspirin: Secondary | ICD-10-CM | POA: Diagnosis not present

## 2016-02-15 DIAGNOSIS — Z8249 Family history of ischemic heart disease and other diseases of the circulatory system: Secondary | ICD-10-CM | POA: Insufficient documentation

## 2016-02-15 DIAGNOSIS — Z6841 Body Mass Index (BMI) 40.0 and over, adult: Secondary | ICD-10-CM | POA: Insufficient documentation

## 2016-02-15 DIAGNOSIS — R001 Bradycardia, unspecified: Secondary | ICD-10-CM | POA: Diagnosis not present

## 2016-02-15 DIAGNOSIS — M1990 Unspecified osteoarthritis, unspecified site: Secondary | ICD-10-CM | POA: Diagnosis not present

## 2016-02-15 DIAGNOSIS — Z87891 Personal history of nicotine dependence: Secondary | ICD-10-CM | POA: Insufficient documentation

## 2016-02-15 DIAGNOSIS — I10 Essential (primary) hypertension: Secondary | ICD-10-CM | POA: Insufficient documentation

## 2016-02-15 DIAGNOSIS — M109 Gout, unspecified: Secondary | ICD-10-CM | POA: Insufficient documentation

## 2016-02-15 DIAGNOSIS — Z823 Family history of stroke: Secondary | ICD-10-CM | POA: Diagnosis not present

## 2016-02-15 DIAGNOSIS — K219 Gastro-esophageal reflux disease without esophagitis: Secondary | ICD-10-CM | POA: Insufficient documentation

## 2016-02-15 DIAGNOSIS — Z86010 Personal history of colonic polyps: Secondary | ICD-10-CM | POA: Insufficient documentation

## 2016-02-15 DIAGNOSIS — I639 Cerebral infarction, unspecified: Secondary | ICD-10-CM | POA: Diagnosis not present

## 2016-02-15 DIAGNOSIS — D649 Anemia, unspecified: Secondary | ICD-10-CM | POA: Insufficient documentation

## 2016-02-15 DIAGNOSIS — F329 Major depressive disorder, single episode, unspecified: Secondary | ICD-10-CM | POA: Diagnosis not present

## 2016-02-15 DIAGNOSIS — R55 Syncope and collapse: Secondary | ICD-10-CM | POA: Diagnosis present

## 2016-02-15 DIAGNOSIS — Z7984 Long term (current) use of oral hypoglycemic drugs: Secondary | ICD-10-CM | POA: Diagnosis not present

## 2016-02-15 DIAGNOSIS — Z794 Long term (current) use of insulin: Secondary | ICD-10-CM | POA: Insufficient documentation

## 2016-02-15 DIAGNOSIS — E079 Disorder of thyroid, unspecified: Secondary | ICD-10-CM | POA: Diagnosis not present

## 2016-02-15 DIAGNOSIS — E119 Type 2 diabetes mellitus without complications: Secondary | ICD-10-CM | POA: Diagnosis not present

## 2016-02-15 DIAGNOSIS — J42 Unspecified chronic bronchitis: Secondary | ICD-10-CM | POA: Diagnosis not present

## 2016-02-15 DIAGNOSIS — E785 Hyperlipidemia, unspecified: Secondary | ICD-10-CM | POA: Diagnosis not present

## 2016-02-15 HISTORY — PX: EP IMPLANTABLE DEVICE: SHX172B

## 2016-02-15 LAB — GLUCOSE, CAPILLARY: GLUCOSE-CAPILLARY: 82 mg/dL (ref 65–99)

## 2016-02-15 SURGERY — LOOP RECORDER INSERTION
Anesthesia: LOCAL

## 2016-02-15 MED ORDER — LIDOCAINE-EPINEPHRINE 1 %-1:100000 IJ SOLN
INTRAMUSCULAR | Status: DC | PRN
  Administered 2016-02-15: 30 mL via INTRADERMAL

## 2016-02-15 MED ORDER — LIDOCAINE-EPINEPHRINE 1 %-1:100000 IJ SOLN
INTRAMUSCULAR | Status: AC
  Filled 2016-02-15: qty 1

## 2016-02-15 SURGICAL SUPPLY — 2 items
LOOP REVEAL LINQSYS (Prosthesis & Implant Heart) ×2 IMPLANT
PACK LOOP INSERTION (CUSTOM PROCEDURE TRAY) ×2 IMPLANT

## 2016-02-15 NOTE — Interval H&P Note (Signed)
History and Physical Interval Note:  02/15/2016 1:36 PM  Katie Mcintyre  has presented today for surgery, with the diagnosis of syncope  The various methods of treatment have been discussed with the patient and family. After consideration of risks, benefits and other options for treatment, the patient has consented to  Procedure(s): Loop Recorder Insertion (N/A) as a surgical intervention .  The patient's history has been reviewed, patient examined, no change in status, stable for surgery.  I have reviewed the patient's chart and labs.  Questions were answered to the patient's satisfaction.     [name]

## 2016-02-15 NOTE — H&P (View-Only) (Signed)
OFFICE NOTE  Chief Complaint:  Syncope, DOE, bradycardia  Primary Care Physician: [name]., DO  HPI:  Katie Mcintyre is a pleasant 75 year old female with a number of cardiovascular risk factors including insulin-dependent diabetes, dyslipidemia, hypertension and morbid obesity. She recently described a couple of episodes when she was driving of presyncope/syncope. She was able to maintain control of the car but basically started to lose consciousness and was in a very afraid. She does not believe this is related to blood sugars although she said that she personally increased her Lantus around that time thinking that her blood sugars my been too high. Her primary care provider then decrease that. She denies any symptoms of heart racing or chest pain. She has reported some shortness of breath which is been progressively worse. An EKG in her primary care provider's office shows sinus bradycardia, left atrial enlargement and voltage criteria for LVH with nonspecific T-wave changes. Heart rate is 50.   PMHx:  Past Medical History  Diagnosis Date  . Hypertension   . Hyperlipidemia   . Diabetes mellitus without complication ([place])   . Thyroid disease   . Gout 06/30/2013  . Depression 06/30/2013  . GERD (gastroesophageal reflux disease)   . Spinal stenosis of lumbar region     s/p spine surgery 02/12/13 with Dr Marcos Eke at [facility]  . Anemia   . Arthritis   . Cancer (HCC)     cervical  . Chicken pox   . Diverticulitis   . Chronic bronchitis ([place])   . Frequent headaches   . Colon polyp   . Urine incontinence     Past Surgical History  Procedure Laterality Date  . Back surgery  01/2013  . Cervical cone biopsy    . Abdominal hysterectomy  1970    complete hysterectomy for cervical cancer  . Back surgery  1995  . Wrist surgery  2010    for wrist fracture    FAMHx:  Family History  Problem Relation Age of Onset  . Alcohol abuse Father   . Alcohol abuse Brother   . Arthritis       parents  . Hyperlipidemia      parent  . Stroke      grandparent  . Hypertension      parent/grandparent  . Diabetes      parent/grandparent  . Breast cancer Sister   . Heart disease Other   . Mental illness Other   . Colon cancer Neg Hx   . Esophageal cancer Neg Hx   . Prostate cancer Neg Hx   . Rectal cancer Neg Hx     SOCHx:   reports that she quit smoking about 51 years ago. She has never used smokeless tobacco. She reports that she drinks alcohol. She reports that she does not use illicit drugs.  ALLERGIES:  Allergies  Allergen Reactions  . Advil [Ibuprofen]     Was taking this for pain and had some ? Swollen lips  . Lyrica [Pregabalin]     Extreme swelling legs    ROS: Pertinent items noted in HPI and remainder of comprehensive ROS otherwise negative.  HOME MEDS: Current Outpatient Prescriptions  Medication Sig Dispense Refill  . ACCU-CHEK FASTCLIX LANCETS MISC Use as directed 100 each 1  . acetaminophen (TYLENOL) 500 MG tablet Take 500 mg by mouth every 8 (eight) hours.    Marland Kitchen allopurinol (ZYLOPRIM) 300 MG tablet TAKE 1 TABLET BY MOUTH ONCE DAILY 30 tablet 1  .  amLODipine (NORVASC) 5 MG tablet Take 1 tablet (5 mg total) by mouth daily. 90 tablet 3  . aspirin 81 MG tablet Take 81 mg by mouth daily.    . B-D UF III MINI PEN NEEDLES 31G X 5 MM MISC USE AS DIRECTED 100 each 1  . Blood Glucose Calibration (ACCU-CHEK AVIVA) SOLN Use as directed. 1 each 1  . calcium-vitamin D (OSCAL 500/200 D-3) 500-200 MG-UNIT per tablet Take 1 tablet by mouth daily.    . cloNIDine (CATAPRES) 0.1 MG tablet TAKE 1 TABLET BY MOUTH TWICE A DAY 60 tablet 2  . ferrous sulfate 325 (65 FE) MG tablet Take 325 mg by mouth daily with breakfast.    . glucose blood (ACCU-CHEK SMARTVIEW) test strip Use as instructed to check blood sugar twice a day 100 each 1  . hydrALAZINE (APRESOLINE) 25 MG tablet TAKE 1 TABLET (25 MG TOTAL) BY MOUTH 2 (TWO) TIMES DAILY. 60 tablet 3  . Insulin Lispro Prot &  Lispro (HUMALOG MIX 75/25 KWIKPEN) (75-25) 100 UNIT/ML Kwikpen Inject 10 Units into the skin 2 (two) times daily before a meal. 15 mL 11  . isosorbide dinitrate (ISORDIL) 20 MG tablet TAKE 1 TABLET (20 MG TOTAL) BY MOUTH 2 (TWO) TIMES DAILY. 60 tablet 3  . isosorbide dinitrate (ISORDIL) 20 MG tablet TAKE 1 TABLET (20 MG TOTAL) BY MOUTH 2 (TWO) TIMES DAILY. 60 tablet 3  . levothyroxine (SYNTHROID, LEVOTHROID) 137 MCG tablet Take 1 tablet (137 mcg total) by mouth daily before breakfast. 90 tablet 0  . losartan (COZAAR) 50 MG tablet Take 1 tablet (50 mg total) by mouth daily. 90 tablet 3  . metFORMIN (GLUCOPHAGE) 1000 MG tablet TAKE 1 TABLET (1,000 MG TOTAL) BY MOUTH 2 (TWO) TIMES DAILY WITH A MEAL. 180 tablet 1  . Sennosides-Docusate Sodium (STOOL SOFTENER & LAXATIVE PO) Take 1 tablet by mouth daily as needed (constipation).     . simvastatin (ZOCOR) 20 MG tablet TAKE 1 TABLET BY MOUTH EVERY DAY 90 tablet 1  . venlafaxine (EFFEXOR) 37.5 MG tablet TAKE 1 TABLET BY MOUTH TWICE A DAY 60 tablet 3  . vitamin B-12 (CYANOCOBALAMIN) 1000 MCG tablet Take 1,000 mcg by mouth daily.     No current facility-administered medications for this visit.    LABS/IMAGING: No results found for this or any previous visit (from the past 48 hour(s)). No results found.  WEIGHTS: Wt Readings from Last 3 Encounters:  02/02/16 243 lb (110.224 kg)  01/11/16 243 lb 3.2 oz (110.315 kg)  09/27/15 242 lb 3.2 oz (109.861 kg)    VITALS: BP 150/64 mmHg  Pulse 88  Ht 5\' 1"  (1.549 m)  Wt 243 lb (110.224 kg)  BMI 45.94 kg/m2  EXAM: General appearance: alert, no distress and morbidly obese Neck: no carotid bruit and no JVD Lungs: clear to auscultation bilaterally Heart: regular rate and rhythm, S1, S2 normal, no murmur, click, rub or gallop Abdomen: soft, non-tender; bowel sounds normal; no masses,  no organomegaly Extremities: extremities normal, atraumatic, no cyanosis or edema Pulses: 2+ and symmetric Skin: Skin  color, texture, turgor normal. No rashes or lesions Neurologic: Grossly normal Psych: Pleasant  EKG: EKG from her primary provider's office personally reviewed-sinus bradycardia at 50, left atrial enlargement, moderate voltage criteria for LVH, nonspecific T-wave changes  ASSESSMENT: 1. Presyncope/syncope 2. Bradycardia 3. Progressive dyspnea and exertion 4. Morbid obesity 5. Hypertension 6. Dyslipidemia 7. Insulin-dependent diabetes  PLAN: 1.   Mrs.Delich is had a few episodes of recent presyncope/syncope which  has been worsening. She's also had some shortness of breath and has EKG changes suggesting possible structural changes in her heart. She's had long-standing hypertension and recently was noted to be bradycardic. I like to obtain an echocardiogram to assess her shortness of breath and syncopal causes. In addition will get carotid Dopplers. Given her bradycardia possibly this could be arrhythmia genetic in nature, coupled with the fact that these episodes may be very infrequent and difficult to pick up on. I'm recommending an implanted loop recorder device. This could potentially pick up an episode at some point over the next several years. The other possibility of her syncope could be related to hypoglycemia. She has had one episode of hypoglycemia in the past for which EMS responded and her blood sugar was about 30. Although, her primary care provider did not feel that this was related to low blood sugar.  Plan to see her back after the imaging studies are completed and she's had a loop recorder implant.  Thanks for the kind referral.  Pixie Casino, MD, [facility] Attending Cardiologist [place] 02/02/2016, 5:30 PM

## 2016-02-16 ENCOUNTER — Telehealth: Payer: Self-pay | Admitting: Cardiovascular Disease

## 2016-02-16 ENCOUNTER — Encounter (HOSPITAL_COMMUNITY): Payer: Self-pay | Admitting: Cardiovascular Disease

## 2016-02-16 NOTE — Telephone Encounter (Signed)
Please call, questions concerning her loop recorder.

## 2016-02-16 NOTE — Telephone Encounter (Signed)
Patient wanted to know if she was to anything else with transmitter  For loop recorder except -have it plugged into the wall at he bedside she sleeps in at night. RN informed patient to keep transmitter at bedside. Nothing else is needed.

## 2016-02-21 ENCOUNTER — Other Ambulatory Visit: Payer: Self-pay

## 2016-02-21 ENCOUNTER — Ambulatory Visit (HOSPITAL_COMMUNITY): Payer: Medicare Other | Attending: Internal Medicine

## 2016-02-21 DIAGNOSIS — Z87891 Personal history of nicotine dependence: Secondary | ICD-10-CM | POA: Diagnosis not present

## 2016-02-21 DIAGNOSIS — I119 Hypertensive heart disease without heart failure: Secondary | ICD-10-CM | POA: Diagnosis not present

## 2016-02-21 DIAGNOSIS — E785 Hyperlipidemia, unspecified: Secondary | ICD-10-CM | POA: Insufficient documentation

## 2016-02-21 DIAGNOSIS — R55 Syncope and collapse: Secondary | ICD-10-CM | POA: Diagnosis not present

## 2016-02-21 DIAGNOSIS — E119 Type 2 diabetes mellitus without complications: Secondary | ICD-10-CM | POA: Diagnosis not present

## 2016-02-21 MED ORDER — PERFLUTREN LIPID MICROSPHERE
1.0000 mL | INTRAVENOUS | Status: AC | PRN
  Administered 2016-02-21: 2.5 mL via INTRAVENOUS

## 2016-02-24 ENCOUNTER — Ambulatory Visit: Payer: Medicare Other

## 2016-02-24 ENCOUNTER — Other Ambulatory Visit (INDEPENDENT_AMBULATORY_CARE_PROVIDER_SITE_OTHER): Payer: Medicare Other

## 2016-02-24 DIAGNOSIS — E039 Hypothyroidism, unspecified: Secondary | ICD-10-CM | POA: Diagnosis not present

## 2016-02-24 DIAGNOSIS — E785 Hyperlipidemia, unspecified: Secondary | ICD-10-CM

## 2016-02-24 LAB — TSH: TSH: 0.77 u[IU]/mL (ref 0.35–4.50)

## 2016-03-09 ENCOUNTER — Ambulatory Visit (INDEPENDENT_AMBULATORY_CARE_PROVIDER_SITE_OTHER): Payer: Medicare Other | Admitting: *Deleted

## 2016-03-09 VITALS — BP 182/81 | HR 75 | Resp 16

## 2016-03-09 DIAGNOSIS — Z95818 Presence of other cardiac implants and grafts: Secondary | ICD-10-CM

## 2016-03-09 DIAGNOSIS — R55 Syncope and collapse: Secondary | ICD-10-CM

## 2016-03-09 LAB — CUP PACEART INCLINIC DEVICE CHECK: MDC IDC SESS DTM: 20170316174938

## 2016-03-09 NOTE — Progress Notes (Signed)
ILR wound check appointment. Steri-strips previously removed by patient. Wound without redness or edema. Incision edges approximated, wound well healed. Battery status: good. R-waves 0.42mV. 1 symptom episode--?SVT w/aberrancy, patient reports pre-syncopal symptoms occurring while driving. 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 No tachy or AF episodes. 2 brady episodes--undersensing. Reprogrammed sensitivity to 0.026mV, AF detection to balanced sensitivity, and AT/AF recording storage to all episodes. Monthly summary reports and ROV with [facility] PRN.   During appointment, noted that patient's eyes began to droop bilaterally and she stopped verbally responding mid-conversation.  Patient had been sitting up in chair but was slumped with her chin resting on her chest by this time.  Patient did not appear to lose consciousness, but her eyes remained half-open.  Attempted orientation questions with patient, but she did not verbally respond.  Called patient's name and patient was able to fully open her eyes on command, but she had difficulty making eye contact initially.  Elevated leg rest on chair and obtained vital signs--BP 182/81, HR 74, O2 sat 98%, CBG 145.  By this time, 2-14min had passed.  Patient was alert and oriented at this point.  Patient reports that these are the same episodes she has been having.  No rhythm abnormalities noted during this episode.  She reports that she suddenly feels like she has no control over her body and she feels herself drifting away.  Patient strongly advised to avoid driving unless instructed otherwise by a physician and she verbalizes understanding of instructions.  Reviewed episode and vital signs with Dr. Tamala Julian (DOD) who advised that patient seek treatment at the ED as she is stable now.  Patient declines transport to the hospital at this time as she feels these episodes are ongoing.  Encouraged patient to  seek follow-up immediately and patient states that she has upcoming appointments with Dr. Debara Pickett and Dr. Maudie Mercury.  Advised patient to discuss possible neuro consult with PCP and patient verbalizes understanding of instructions.

## 2016-03-15 ENCOUNTER — Encounter: Payer: Self-pay | Admitting: Internal Medicine

## 2016-03-15 ENCOUNTER — Ambulatory Visit (INDEPENDENT_AMBULATORY_CARE_PROVIDER_SITE_OTHER): Payer: Medicare Other | Admitting: Internal Medicine

## 2016-03-15 VITALS — BP 164/68 | HR 72 | Ht 61.5 in | Wt 246.5 lb

## 2016-03-15 DIAGNOSIS — I10 Essential (primary) hypertension: Secondary | ICD-10-CM

## 2016-03-15 DIAGNOSIS — R001 Bradycardia, unspecified: Secondary | ICD-10-CM

## 2016-03-15 DIAGNOSIS — R55 Syncope and collapse: Secondary | ICD-10-CM

## 2016-03-15 NOTE — Progress Notes (Signed)
OFFICE NOTE  Chief Complaint:  Follow-up loop recorder  Primary Care Physician: [name]., DO  HPI:  Katie Mcintyre is a pleasant 75 year old female with a number of cardiovascular risk factors including insulin-dependent diabetes, dyslipidemia, hypertension and morbid obesity. She recently described a couple of episodes when she was driving of presyncope/syncope. She was able to maintain control of the car but basically started to lose consciousness and was in a very afraid. She does not believe this is related to blood sugars although she said that she personally increased her Lantus around that time thinking that her blood sugars my been too high. Her primary care provider then decrease that. She denies any symptoms of heart racing or chest pain. She has reported some shortness of breath which is been progressively worse. An EKG in her primary care provider's office shows sinus bradycardia, left atrial enlargement and voltage criteria for LVH with nonspecific T-wave changes. Heart rate is 50.   Katie Mcintyre returns today for follow-up of her loop recorder. The device interrogation indicated 1 possible high rate episode for which she said she had some presyncopal symptoms while driving. There were 2 bradycardic episodes which were not captured due to under sensing. Neither of these episodes however were associated with her syncopal episode. Please refer to the device interrogation note, which is copied below for description of a recent event that she had which was witnessed during her device interrogation in our office:  "During appointment, noted that patient's eyes began to droop bilaterally and she stopped verbally responding mid-conversation. Patient had been sitting up in chair but was slumped with her chin resting on her chest by this time. Patient did not appear to lose consciousness, but her eyes remained half-open. Attempted orientation questions with patient, but she did not  verbally respond. Called patient's name and patient was able to fully open her eyes on command, but she had difficulty making eye contact initially. Elevated leg rest on chair and obtained vital signs--BP 182/81, HR 74, O2 sat 98%, CBG 145. By this time, 2-61min had passed. Patient was alert and oriented at this point. Patient reports that these are the same episodes she has been having. No rhythm abnormalities noted during this episode. She reports that she suddenly feels like she has no control over her body and she feels herself drifting away. Patient strongly advised to avoid driving unless instructed otherwise by a physician and she verbalizes understanding of instructions. Reviewed episode and vital signs with Dr. Tamala Julian (DOD) who advised that patient seek treatment at the ED as she is stable now. Patient declines transport to the hospital at this time as she feels these episodes are ongoing. Encouraged patient to seek follow-up immediately and patient states that she has upcoming appointments with Dr. Debara Pickett and Dr. Maudie Mercury. Advised patient to discuss possible neuro consult with PCP and patient verbalizes understanding of instructions."  PMHx:  Past Medical History  Diagnosis Date  . Hypertension   . Hyperlipidemia   . Diabetes mellitus without complication ([place])   . Thyroid disease   . Gout 06/30/2013  . Depression 06/30/2013  . GERD (gastroesophageal reflux disease)   . Spinal stenosis of lumbar region     s/p spine surgery 02/12/13 with Dr Marcos Eke at [facility]  . Anemia   . Arthritis   . Cancer (HCC)     cervical  . Chicken pox   . Diverticulitis   . Chronic bronchitis ([place])   . Frequent headaches   .  Colon polyp   . Urine incontinence     Past Surgical History  Procedure Laterality Date  . Back surgery  01/2013  . Cervical cone biopsy    . Abdominal hysterectomy  1970    complete hysterectomy for cervical cancer  . Back surgery  1995  . Wrist surgery  2010    for wrist  fracture  . Ep implantable device N/A 02/15/2016    Procedure: Loop Recorder Insertion;  Surgeon: Sanda Klein, MD;  Location: [place] [facility];  Service: Cardiovascular;  Laterality: N/A;    FAMHx:  Family History  Problem Relation Age of Onset  . Alcohol abuse Father   . Cirrhosis Father   . Heart attack Brother   . Arthritis      parents  . Hyperlipidemia      parent  . Hypertension      parent/grandparent  . Diabetes      parent/grandparent  . Breast cancer Sister   . Heart disease Other   . Mental illness Other   . Colon cancer Neg Hx   . Esophageal cancer Neg Hx   . Prostate cancer Neg Hx   . Rectal cancer Neg Hx   . Cancer Mother     renal cancer  . Stroke Paternal Grandmother   . Alcohol abuse Brother   . Cancer Sister     renal cancer  . Diabetes Brother   . Heart Problems Daughter   . Healthy Daughter   . Healthy Daughter   . Healthy Son   . Healthy Son     SOCHx:   reports that she quit smoking about 51 years ago. She has never used smokeless tobacco. She reports that she drinks alcohol. She reports that she does not use illicit drugs.  ALLERGIES:  Allergies  Allergen Reactions  . Advil [Ibuprofen]     Was taking this for pain and had some ? Swollen lips  . Lyrica [Pregabalin]     Extreme swelling legs    ROS: Pertinent items noted in HPI and remainder of comprehensive ROS otherwise negative.  HOME MEDS: Current Outpatient Prescriptions  Medication Sig Dispense Refill  . ACCU-CHEK FASTCLIX LANCETS MISC Use as directed 100 each 1  . acetaminophen (TYLENOL) 500 MG tablet Take 1,000 mg by mouth every 8 (eight) hours as needed.    Marland Kitchen allopurinol (ZYLOPRIM) 300 MG tablet TAKE 1 TABLET BY MOUTH ONCE DAILY 30 tablet 1  . amLODipine (NORVASC) 5 MG tablet Take 1 tablet (5 mg total) by mouth daily. 90 tablet 3  . aspirin 81 MG tablet Take 81 mg by mouth daily.    . B-D UF III MINI PEN NEEDLES 31G X 5 MM MISC USE AS DIRECTED 100 each 1  . Blood  Glucose Calibration (ACCU-CHEK AVIVA) SOLN Use as directed. 1 each 1  . calcium-vitamin D (OSCAL 500/200 D-3) 500-200 MG-UNIT per tablet Take 1 tablet by mouth daily.    . cloNIDine (CATAPRES) 0.1 MG tablet TAKE 1 TABLET BY MOUTH TWICE A DAY 60 tablet 2  . ferrous sulfate 325 (65 FE) MG tablet Take 325 mg by mouth daily with breakfast.    . glucose blood (ACCU-CHEK SMARTVIEW) test strip Use as instructed to check blood sugar twice a day 100 each 1  . hydrALAZINE (APRESOLINE) 25 MG tablet TAKE 1 TABLET (25 MG TOTAL) BY MOUTH 2 (TWO) TIMES DAILY. 60 tablet 3  . Insulin Lispro Prot & Lispro (HUMALOG MIX 75/25 KWIKPEN) (75-25) 100 UNIT/ML [employer]  Inject 10 Units into the skin 2 (two) times daily before a meal. 15 mL 11  . isosorbide dinitrate (ISORDIL) 20 MG tablet TAKE 1 TABLET (20 MG TOTAL) BY MOUTH 2 (TWO) TIMES DAILY. 60 tablet 3  . levothyroxine (SYNTHROID, LEVOTHROID) 137 MCG tablet Take 1 tablet (137 mcg total) by mouth daily before breakfast. 90 tablet 0  . losartan (COZAAR) 50 MG tablet Take 1 tablet (50 mg total) by mouth daily. 90 tablet 3  . metFORMIN (GLUCOPHAGE) 1000 MG tablet TAKE 1 TABLET (1,000 MG TOTAL) BY MOUTH 2 (TWO) TIMES DAILY WITH A MEAL. 180 tablet 1  . Sennosides-Docusate Sodium (STOOL SOFTENER & LAXATIVE PO) Take 1 tablet by mouth daily as needed (constipation).     . simvastatin (ZOCOR) 20 MG tablet TAKE 1 TABLET BY MOUTH EVERY DAY 90 tablet 1  . venlafaxine (EFFEXOR) 37.5 MG tablet TAKE 1 TABLET BY MOUTH TWICE A DAY 60 tablet 3  . vitamin B-12 (CYANOCOBALAMIN) 1000 MCG tablet Take 1,000 mcg by mouth daily.     No current facility-administered medications for this visit.    LABS/IMAGING: No results found for this or any previous visit (from the past 48 hour(s)). No results found.  WEIGHTS: Wt Readings from Last 3 Encounters:  03/15/16 246 lb 8 oz (111.812 kg)  02/02/16 243 lb (110.224 kg)  01/11/16 243 lb 3.2 oz (110.315 kg)    VITALS: BP 164/68 mmHg  Pulse 72   Ht 5' 1.5" (1.562 m)  Wt 246 lb 8 oz (111.812 kg)  BMI 45.83 kg/m2  EXAM: Deferred  EKG: Deferred  ASSESSMENT: 1. Presyncope/syncope - no clear relation to device findings. 2. Bradycardia 3. Progressive dyspnea and exertion 4. Morbid obesity 5. Hypertension 6. Dyslipidemia 7. Insulin-dependent diabetes  PLAN: 1.   KatiePiazza continues to have some episodes in fact had a witnessed episode while device was being interrogated. During that period of time there were no abnormal cardiac findings. Her blood pressure was normal. Her blood sugar was normal. She had a staring episode for which she was nonresponsive and lasted several minutes. I'm concerned about a neurologic cause for this such as seizure. In addition, she has an upcoming sleep apnea study. The I wonder if these episodes could be narcolepsy although she was not arousable. At this point, we'll continue to monitor her loop recorder, but I do not see indications for medical therapy or pacemaker at this point. I would advise that she see a neurologist for further workup and will provide a referral.  Plan to see her back in 3 months for close follow-up.  Pixie Casino, MD, [facility] Attending Cardiologist [facility] 03/15/2016, 2:14 PM

## 2016-03-15 NOTE — Patient Instructions (Signed)
You have been referred to Dr. Carles Collet with [facility]   Your physician recommends that you schedule a follow-up appointment in 3 months with Dr. Debara Pickett.

## 2016-03-16 ENCOUNTER — Ambulatory Visit (INDEPENDENT_AMBULATORY_CARE_PROVIDER_SITE_OTHER): Payer: Medicare Other | Admitting: *Deleted

## 2016-03-16 DIAGNOSIS — R55 Syncope and collapse: Secondary | ICD-10-CM | POA: Diagnosis not present

## 2016-03-17 NOTE — Progress Notes (Signed)
Carelink Summary Report / Loop Recorder 

## 2016-03-20 ENCOUNTER — Other Ambulatory Visit: Payer: Self-pay | Admitting: Family Medicine

## 2016-03-21 ENCOUNTER — Encounter: Payer: Self-pay | Admitting: Family Medicine

## 2016-03-21 ENCOUNTER — Ambulatory Visit (INDEPENDENT_AMBULATORY_CARE_PROVIDER_SITE_OTHER): Payer: Medicare Other | Admitting: Family Medicine

## 2016-03-21 VITALS — BP 138/82 | HR 85 | Temp 97.5°F | Ht 61.5 in | Wt 243.1 lb

## 2016-03-21 DIAGNOSIS — I10 Essential (primary) hypertension: Secondary | ICD-10-CM

## 2016-03-21 DIAGNOSIS — E1159 Type 2 diabetes mellitus with other circulatory complications: Secondary | ICD-10-CM | POA: Diagnosis not present

## 2016-03-21 DIAGNOSIS — E785 Hyperlipidemia, unspecified: Secondary | ICD-10-CM

## 2016-03-21 DIAGNOSIS — E039 Hypothyroidism, unspecified: Secondary | ICD-10-CM | POA: Diagnosis not present

## 2016-03-21 DIAGNOSIS — Z794 Long term (current) use of insulin: Secondary | ICD-10-CM

## 2016-03-21 NOTE — Progress Notes (Signed)
HPI:  Note:Difficult to help her with her medical issues as she frequently does not come to appointments. She helps care for her children and her grandchildren and I suspect from what she tells me, has little time or energy leftover to care for herself, remember to take medications or come to appointments. She struggles with her finances and unfortunately reports at times has to make choices about buying meds or buying food.   DM: -circulatory complications -meds: metformin, asa, losartan, mixed humolog 75/25 -switched to mixed insulin 04/2015 -reports: doing 10 units of humolog twice daily, BS in the 80-140s ranges fasting and premeal on average, no low blood sugars - brings detailed log -denies: hypoglycemia, vision issues, polyuria -eye exam: sees Dr. Zadie Rhine, exam not done this year - agrees to schedule  HLD/Obesity: -meds simvastatin 20 -denies: leg cramps or cog impairment -exercising frequently at [employer]  HTN/Chronic LE edema: -meds: isosorbide, hydralazine, clonidine, metoprolol, losartan, norvasc -denies: CP, SOB, DOE, palpitiations -chronic LE edema intermittently - non-compliant with compression  Hypothyroid:  -back in normal range last check -reports:feeling better -denies: hot/cold intol, change in weight  Anemia:  Stable on iron therapy from prior PCP Had colonoscopy with polpys, repeat advised in 08/2016   Hx Gout: -on allopurinol from prior PCP  Depression: -meds: effexor 37.5 - she stopped this suddenly 1 month ago and felt bad for 1 week, but then felt fine and feels she no longer suffers from depression and does not want to continue to take -denies: depression, panic, SI  Pre-syncope: -currently undergoing evaluation with cardiology, appreciate recent recs and she has been referred to neurology as well by the cardiologist as had recent event during interrogation of loop recorder -occurred once several years ago then several times recently -brief  episodes of feeling like she is about to pass out -on review OSH records hx abnormal EKG with cardiology eval in the past with echo and stress test, 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  Past Medical History  Diagnosis Date  . Hypertension   . Hyperlipidemia   . Diabetes mellitus without complication ([place])   . Thyroid disease   . Gout 06/30/2013  . Depression 06/30/2013  . GERD (gastroesophageal reflux disease)   . Spinal stenosis of lumbar region     s/p spine surgery 02/12/13 with Dr Marcos Eke at [facility]  . Anemia   . Arthritis   . Cancer (HCC)     cervical  . Chicken pox   . Diverticulitis   . Chronic bronchitis ([place])   . Frequent headaches   . Colon polyp   . Urine incontinence     Past Surgical History  Procedure Laterality Date  . Back surgery  01/2013  . Cervical cone biopsy    . Abdominal hysterectomy  1970    complete hysterectomy for cervical cancer  . Back surgery  1995  . Wrist surgery  2010    for wrist fracture  . Ep implantable device N/A 02/15/2016    Procedure: Loop Recorder Insertion;  Surgeon: Sanda Klein, MD;  Location: [place] [facility];  Service: Cardiovascular;  Laterality: N/A;    Family History  Problem Relation Age of Onset  . Alcohol abuse Father   . Cirrhosis Father   . Heart attack Brother   . Arthritis      parents  . Hyperlipidemia      parent  . Hypertension      parent/grandparent  .  Diabetes      parent/grandparent  . Breast cancer Sister   . Heart disease Other   . Mental illness Other   . Colon cancer Neg Hx   . Esophageal cancer Neg Hx   . Prostate cancer Neg Hx   . Rectal cancer Neg Hx   . Cancer Mother     renal cancer  . Stroke Paternal Grandmother   . Alcohol abuse Brother   . Cancer Sister     renal cancer  . Diabetes Brother   . Heart Problems Daughter   . Healthy Daughter   . Healthy Daughter   . Healthy Son   . Healthy Son     Social History   Social History   . Marital Status: Divorced    Spouse Name: N/A  . Number of Children: N/A  . Years of Education: N/A   Social History Main Topics  . Smoking status: Former Smoker    Quit date: 12/25/1964  . Smokeless tobacco: Never Used  . Alcohol Use: Yes     Comment: occ with steak dinner   . Drug Use: No  . Sexual Activity: Not Asked   Other Topics Concern  . 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 [place]: exercising on regular visit; trying to work on diet            Epworth Sleepiness Scale = 20 (as of 02/02/2016)              Current outpatient prescriptions:  .  ACCU-CHEK FASTCLIX LANCETS MISC, Use as directed, Disp: 100 each, Rfl: 1 .  ACCU-CHEK SMARTVIEW test strip, USE AS INSTRUCTED TO CHECK BLOOD SUGAR TWICE A DAY, Disp: 100 each, Rfl: 2 .  acetaminophen (TYLENOL) 500 MG tablet, Take 1,000 mg by mouth every 8 (eight) hours as needed., Disp: , Rfl:  .  allopurinol (ZYLOPRIM) 300 MG tablet, TAKE 1 TABLET BY MOUTH ONCE DAILY, Disp: 30 tablet, Rfl: 1 .  amLODipine (NORVASC) 5 MG tablet, Take 1 tablet (5 mg total) by mouth daily., Disp: 90 tablet, Rfl: 3 .  aspirin 81 MG tablet, Take 81 mg by mouth daily., Disp: , Rfl:  .  B-D UF III MINI PEN NEEDLES 31G X 5 MM MISC, USE AS DIRECTED, Disp: 100 each, Rfl: 1 .  Blood Glucose Calibration (ACCU-CHEK AVIVA) SOLN, Use as directed., Disp: 1 each, Rfl: 1 .  calcium-vitamin D (OSCAL 500/200 D-3) 500-200 MG-UNIT per tablet, Take 1 tablet by mouth daily., Disp: , Rfl:  .  cloNIDine (CATAPRES) 0.1 MG tablet, TAKE 1 TABLET BY MOUTH TWICE A DAY, Disp: 60 tablet, Rfl: 2 .  ferrous sulfate 325 (65 FE) MG tablet, Take 325 mg by mouth daily with breakfast., Disp: , Rfl:  .  glucose blood (ACCU-CHEK SMARTVIEW) test strip, Use as instructed to check blood sugar twice a day, Disp: 100 each, Rfl: 1 .  hydrALAZINE  (APRESOLINE) 25 MG tablet, TAKE 1 TABLET (25 MG TOTAL) BY MOUTH 2 (TWO) TIMES DAILY., Disp: 60 tablet, Rfl: 3 .  Insulin Lispro Prot & Lispro (HUMALOG MIX 75/25 KWIKPEN) (75-25) 100 UNIT/ML Kwik[employer], Inject 10 Units into the skin 2 (two) times daily before a meal., Disp: 15 mL, Rfl: 11 .  isosorbide dinitrate (ISORDIL) 20 MG tablet, TAKE 1 TABLET (20 MG TOTAL) BY MOUTH  2 (TWO) TIMES DAILY., Disp: 60 tablet, Rfl: 3 .  levothyroxine (SYNTHROID, LEVOTHROID) 137 MCG tablet, Take 1 tablet (137 mcg total) by mouth daily before breakfast., Disp: 90 tablet, Rfl: 0 .  losartan (COZAAR) 50 MG tablet, Take 1 tablet (50 mg total) by mouth daily., Disp: 90 tablet, Rfl: 3 .  metFORMIN (GLUCOPHAGE) 1000 MG tablet, TAKE 1 TABLET (1,000 MG TOTAL) BY MOUTH 2 (TWO) TIMES DAILY WITH A MEAL., Disp: 180 tablet, Rfl: 1 .  Sennosides-Docusate Sodium (STOOL SOFTENER & LAXATIVE PO), Take 1 tablet by mouth daily as needed (constipation). , Disp: , Rfl:  .  simvastatin (ZOCOR) 20 MG tablet, TAKE 1 TABLET BY MOUTH EVERY DAY, Disp: 90 tablet, Rfl: 1 .  vitamin B-12 (CYANOCOBALAMIN) 1000 MCG tablet, Take 1,000 mcg by mouth daily., Disp: , Rfl:  .  venlafaxine (EFFEXOR) 37.5 MG tablet, TAKE 1 TABLET BY MOUTH TWICE A DAY (Patient not taking: Reported on 03/21/2016), Disp: 60 tablet, Rfl: 3  EXAM:  Filed Vitals:   03/21/16 1317  BP: 138/82  Pulse: 85  Temp: 97.5 F (36.4 C)    Body mass index is 45.2 kg/(m^2).  GENERAL: vitals reviewed and listed above, alert, oriented, appears well hydrated and in no acute distress  HEENT: atraumatic, conjunttiva clear, no obvious abnormalities on inspection of external nose and ears  NECK: no obvious masses on inspection  LUNGS: clear to auscultation bilaterally, no wheezes, rales or rhonchi, good air movement  CV: HRRR, no peripheral edema  MS: moves all extremities without noticeable abnormality  PSYCH: pleasant and cooperative, no obvious depression or anxiety  ASSESSMENT AND  PLAN:  Discussed the following assessment and plan:  Type 2 diabetes mellitus with other circulatory complication, with long-term current use of insulin (HCC)  Hyperlipemia - Plan: Lipid panel  Essential hypertension, benign  Hypothyroidism, unspecified hypothyroidism type  -dong well despite complicated health history -plans to see neurology for eval presyncopal episode, reports none since event in cardiology notes -has started exercising, advised caution with gradual increase in activity - congratulated on changes -needs lipid panel -foot exam done -advised diabetic eye exam -Patient advised to return or notify a doctor immediately if symptoms worsen or persist or new concerns arise.  Patient Instructions  BEFORE YOU LEAVE: -schedule fasting lab appointment for cholesterol check -schedule follow up appointment in 3- 4 months  Schedule eye exam  We recommend the following healthy lifestyle measures: - eat a healthy whole foods diet consisting of regular small meals composed of vegetables, fruits, beans, nuts, seeds, healthy meats such as white chicken and fish and whole grains.  - avoid sweets, white starchy foods, fried foods, fast food, processed foods, sodas, red meet and other fattening foods.  - get a least 150-300 minutes of aerobic exercise per week. Please ease into exercise slowly! Start with 5 minutes on any given machine, then gradually increase.      Colin Benton R.

## 2016-03-21 NOTE — Progress Notes (Signed)
Pre visit review using our clinic review tool, if applicable. No additional management support is needed unless otherwise documented below in the visit note. 

## 2016-03-21 NOTE — Patient Instructions (Addendum)
BEFORE YOU LEAVE: -schedule fasting lab appointment for cholesterol check -schedule follow up appointment in 3- 4 months  Schedule eye exam  We recommend the following healthy lifestyle measures: - eat a healthy whole foods diet consisting of regular small meals composed of vegetables, fruits, beans, nuts, seeds, healthy meats such as white chicken and fish and whole grains.  - avoid sweets, white starchy foods, fried foods, fast food, processed foods, sodas, red meet and other fattening foods.  - get a least 150-300 minutes of aerobic exercise per week. Please ease into exercise slowly! Start with 5 minutes on any given machine, then gradually increase.

## 2016-03-22 ENCOUNTER — Encounter: Payer: Self-pay | Admitting: Family Medicine

## 2016-04-10 ENCOUNTER — Telehealth: Payer: Self-pay | Admitting: *Deleted

## 2016-04-10 DIAGNOSIS — R55 Syncope and collapse: Secondary | ICD-10-CM

## 2016-04-10 NOTE — Telephone Encounter (Signed)
Received call from this patient, stating she was referred to Dr. [facility]. She is very upset because she has yet to hear from their practice since referral on 3/22. She called them today and was placed on extended hold, and no one took her call.  She would like to be referred to a different practice for her initial neurology workup. I voiced my sympathy and apologies for the patient's situation, and informed her that I would seek  Dr. Lysbeth Penner recommendation for new neurology referral.  Pt voiced thanks.

## 2016-04-11 ENCOUNTER — Ambulatory Visit: Payer: Medicare Other | Admitting: Family Medicine

## 2016-04-11 NOTE — Telephone Encounter (Signed)
[facility] referral entered - msg sent to scheduling. Pt notified of new referral.

## 2016-04-11 NOTE — Telephone Encounter (Signed)
Please refer to [facility] then  -1st available.  Dr. Lemmie Evens

## 2016-04-12 ENCOUNTER — Other Ambulatory Visit: Payer: Self-pay | Admitting: Family Medicine

## 2016-04-13 ENCOUNTER — Ambulatory Visit (INDEPENDENT_AMBULATORY_CARE_PROVIDER_SITE_OTHER): Payer: Medicare Other | Admitting: Neurology

## 2016-04-13 ENCOUNTER — Encounter: Payer: Self-pay | Admitting: Neurology

## 2016-04-13 VITALS — BP 212/78 | HR 59 | Ht 61.5 in | Wt 246.0 lb

## 2016-04-13 DIAGNOSIS — R404 Transient alteration of awareness: Secondary | ICD-10-CM

## 2016-04-13 NOTE — Progress Notes (Signed)
[facility]    Provider:  Dr Jaynee Eagles Referring Provider: Charlotte Sanes, Dr. Debara Pickett Primary Care Physician:  [name]., DO  CC:  Syncope  HPI:  Katie Mcintyre is a 75 y.o. female here as a referral from Dr. Maudie Mercury for syncope. 75 year old female with a number of cardiovascular risk factors including insulin-dependent diabetes, dyslipidemia, hypertension, bradycardia, progressive dyspnea and exertion,  and morbid obesity. She recently described a couple of episodes when she was driving of presyncope/syncope.She has been evaluated in cardiology with a recent loop recorder placed.  She had several episodes, 2 in February. She had changed her insulan dosage because she thought her dosage was high and her pcp changed it back. She had another episode in March as well. She had the loop recorder on at the time. She was behind the wheel, she couldn't do anything, she had her grandson in the car and mother was in the front, she was "going out", She felt like she was passing out, she couldn't hold her head up, she was aware when it was happening, then she "got herself together" and pulled over into a parking lot. In the car it lasted seconds, never lost awareness, she never lost consciousness, always aware of surroundings. Entire body was affected. She felt like she lost control of all her movements. She did not urinate or defecate on herself. She felt like she was going to faint. No confusion, no shaking, she felt limp and weak. No anxiety, heart palpitations. Both morning she has had oatmeal before breakfast and she increased her insulin on her own. She has had episodes of hypoglycemia in the past with similar symptoms and tried giving her OJ got down to the 30s and went to the emergency room. No other focal neurologic complaints or symptoms. She has had these episodes off and on. No inciting events. In 2010 she had an episode when she was driving and she could feel herslef going into a similar  episode and she went to sleep and woke up fine. In March she was sitting in the chair and she couldn't get up, seconds and then was ok. An EKG in her primary care provider's office shows sinus bradycardia, left atrial enlargement and voltage criteria for LVH with nonspecific T-wave changes. Heart rate is 50. No FHx of seizures. Never had a seizure as a child. She snores at night. She is excessively tired during the day. She has an appointment to be tested for sleep study. She has an appointment for sleep apnea test.  Reviewed notes, labs and imaging from outside physicians, which showed:  Study Conclusions: Echocardiogram  - Procedure narrative: Transthoracic echocardiography. Image  quality was suboptimal. The study was technically difficult.  Intravenous contrast (Definity) was administered. - Left ventricle: The cavity size was normal. Wall thickness was  increased in a pattern of mild LVH. Systolic function was normal.  The estimated ejection fraction was in the range of 60% to 65%.  Doppler parameters are consistent with abnormal left ventricular  relaxation (grade 1 diastolic dysfunction).  Carotids: Mild stable carotid plaque  TSH 0.77 (nml)  Reviewed notes from 03/15/2016 appointment: During appointment, noted that patient's eyes began to droop bilaterally and she stopped verbally responding mid-conversation. Patient had been sitting up in chair but was slumped with her chin resting on her chest by this time. Patient did not appear to lose consciousness, but her eyes remained half-open. Attempted orientation questions with patient, but she did not verbally respond. Called patient's name and  patient was able to fully open her eyes on command, but she had difficulty making eye contact initially. Elevated leg rest on chair and obtained vital signs--BP 182/81, HR 74, O2 sat 98%, CBG 145. By this time, 2-45min had passed. Patient was alert and oriented at this point. Patient  reports that these are the same episodes she has been having. No rhythm abnormalities noted during this episode. She reports that she suddenly feels like she has no control over her body and she feels herself drifting away. Patient strongly advised to avoid driving unless instructed otherwise by a physician and she verbalizes understanding of instructions. Reviewed episode and vital signs with Dr. Tamala Julian (DOD) who advised that patient seek treatment at the ED as she is stable now. Patient declines transport to the hospital at this time as she feels these episodes are ongoing. Encouraged patient to seek follow-up immediately and patient states that she has upcoming appointments with Dr. Debara Pickett and Dr. Maudie Mercury. Advised patient to discuss possible neuro consult with PCP and patient verbalizes understanding of instructions."  Review of Systems: Patient complains of symptoms per HPI as well as the following symptoms: fatigue, SOB, increased thirst, joint pain, itching, decreased energy. Pertinent negatives per HPI. All others negative.   Social History   Social History  . Marital Status: Divorced    Spouse Name: N/A  . Number of Children: 5  . Years of Education: 20   Occupational History  . Retired    Social History Main Topics  . Smoking status: Former Smoker    Quit date: 12/25/1964  . Smokeless tobacco: Never Used  . Alcohol Use: Yes     Comment: occ with steak dinner   . Drug Use: No  . Sexual Activity: Not on file   Other Topics Concern  . Not on file   Social History Narrative   Work or School: retired Nurse, children's Situation: lives with son in [place]: exercising on regular visit; trying to work on diet            Epworth Sleepiness Scale = 20 (as of 02/02/2016)             Family History  Problem Relation Age of Onset  . Alcohol abuse Father   . Cirrhosis Father   . Heart attack Brother   .  Arthritis      parents  . Hyperlipidemia      parent  . Hypertension      parent/grandparent  . Diabetes      parent/grandparent  . Breast cancer Sister   . Heart disease Other   . Mental illness Other   . Colon cancer Neg Hx   . Esophageal cancer Neg Hx   . Prostate cancer Neg Hx   . Rectal cancer Neg Hx   . Seizures Neg Hx   . Cancer Mother     renal cancer  . Stroke Paternal Grandmother   . Alcohol abuse Brother   . Cancer Sister     renal cancer  . Diabetes Brother   . Heart Problems Daughter   . Healthy Daughter   . Healthy Daughter   . Healthy Son   . Healthy Son     Past Medical History  Diagnosis Date  . Hypertension   . Hyperlipidemia   . Diabetes mellitus without complication ([place])   . Thyroid disease   .  Gout 06/30/2013  . Depression 06/30/2013  . GERD (gastroesophageal reflux disease)   . Spinal stenosis of lumbar region     s/p spine surgery 02/12/13 with Dr Marcos Eke at [facility]  . Anemia   . Arthritis   . Cancer (H[facility])     cervical  . Chicken pox   . Diverticulitis   . Chronic bronchitis ([place])   . Frequent headaches   . Colon polyp   . Urine incontinence     Past Surgical History  Procedure Laterality Date  . Back surgery  01/2013  . Cervical cone biopsy    . Abdominal hysterectomy  1970    complete hysterectomy for cervical cancer  . Back surgery  1995  . Wrist surgery  2010    for wrist fracture  . Ep implantable device N/A 02/15/2016    Procedure: Loop Recorder Insertion;  Surgeon: Sanda Klein, MD;  Location: [facility];  Service: Cardiovascular;  Laterality: N/A;    Current Outpatient Prescriptions  Medication Sig Dispense Refill  . ACCU-CHEK FASTCLIX LANCETS MISC Use as directed 100 each 1  . ACCU-CHEK SMARTVIEW test strip USE AS INSTRUCTED TO CHECK BLOOD SUGAR TWICE A DAY 100 each 2  . acetaminophen (TYLENOL) 500 MG tablet Take 1,000 mg by mouth every 8 (eight) hours as needed.    Marland Kitchen allopurinol (ZYLOPRIM) 300 MG tablet TAKE 1  TABLET BY MOUTH ONCE DAILY 30 tablet 1  . amLODipine (NORVASC) 5 MG tablet Take 1 tablet (5 mg total) by mouth daily. 90 tablet 3  . aspirin 81 MG tablet Take 81 mg by mouth daily.    . B-D UF III MINI PEN NEEDLES 31G X 5 MM MISC USE AS DIRECTED 100 each 1  . Blood Glucose Calibration (ACCU-CHEK AVIVA) SOLN Use as directed. 1 each 1  . calcium-vitamin D (OSCAL 500/200 D-3) 500-200 MG-UNIT per tablet Take 1 tablet by mouth daily.    . cloNIDine (CATAPRES) 0.1 MG tablet TAKE 1 TABLET BY MOUTH TWICE A DAY 60 tablet 2  . ferrous sulfate 325 (65 FE) MG tablet Take 325 mg by mouth daily with breakfast.    . glucose blood (ACCU-CHEK SMARTVIEW) test strip Use as instructed to check blood sugar twice a day 100 each 1  . hydrALAZINE (APRESOLINE) 25 MG tablet TAKE 1 TABLET (25 MG TOTAL) BY MOUTH 2 (TWO) TIMES DAILY. 60 tablet 3  . Insulin Lispro Prot & Lispro (HUMALOG MIX 75/25 KWIKPEN) (75-25) 100 UNIT/ML Kwikpen Inject 10 Units into the skin 2 (two) times daily before a meal. 15 mL 11  . isosorbide dinitrate (ISORDIL) 20 MG tablet TAKE 1 TABLET (20 MG TOTAL) BY MOUTH 2 (TWO) TIMES DAILY. 60 tablet 3  . levothyroxine (SYNTHROID, LEVOTHROID) 137 MCG tablet TAKE ONE TABLET BY MOUTH ONCE DAILY BEFORE  BREAKFAST 90 tablet 1  . losartan (COZAAR) 50 MG tablet Take 1 tablet (50 mg total) by mouth daily. 90 tablet 3  . metFORMIN (GLUCOPHAGE) 1000 MG tablet TAKE 1 TABLET (1,000 MG TOTAL) BY MOUTH 2 (TWO) TIMES DAILY WITH A MEAL. 180 tablet 1  . Sennosides-Docusate Sodium (STOOL SOFTENER & LAXATIVE PO) Take 1 tablet by mouth daily as needed (constipation).     . simvastatin (ZOCOR) 20 MG tablet TAKE 1 TABLET BY MOUTH EVERY DAY 90 tablet 1  . venlafaxine (EFFEXOR) 37.5 MG tablet TAKE 1 TABLET BY MOUTH TWICE A DAY 60 tablet 3  . vitamin B-12 (CYANOCOBALAMIN) 1000 MCG tablet Take 1,000 mcg by mouth daily.  No current facility-administered medications for this visit.    Allergies as of 04/13/2016 - Review Complete  04/13/2016  Allergen Reaction Noted  . Advil [ibuprofen]  06/30/2013  . Lyrica [pregabalin]  06/30/2013    Vitals: BP 212/78 mmHg  Pulse 59  Ht 5' 1.5" (1.562 m)  Wt 246 lb (111.585 kg)  BMI 45.73 kg/m2 Last Weight:  Wt Readings from Last 1 Encounters:  04/13/16 246 lb (111.585 kg)   Last Height:   Ht Readings from Last 1 Encounters:  04/13/16 5' 1.5" (1.562 m)   Physical exam: Exam: Gen: NAD, conversant, well nourised, obese, well groomed                     CV: RRR, no MRG. No Carotid Bruits. No peripheral edema, warm, nontender Eyes: Conjunctivae clear without exudates or hemorrhage  Neuro: Detailed Neurologic Exam  Speech:    Speech is normal; fluent and spontaneous with normal comprehension.  Cognition:    The patient is oriented to person, place, and time;     recent and remote memory intact;     language fluent;     normal attention, concentration,     fund of knowledge Cranial Nerves:    The pupils are equal, round, and reactive to light. Attempted to visualize fundi, pupils small. Visual fields are full to finger confrontation. Extraocular movements are intact. Trigeminal sensation is intact and the muscles of mastication are normal. Left ptosis (chronic) otherwise face is symmetric. The palate elevates in the midline. Hearing intact. Voice is normal. Shoulder shrug is normal. The tongue has normal motion without fasciculations.   Coordination:    Normal finger to nose and heel to shin.   Gait:    Wide based. Uses a cane.   Motor Observation:    No asymmetry, no atrophy, and no involuntary movements noted. Tone:    Normal muscle tone.    Posture:    Posture is normal. normal erect    Strength: bilateral proximal hip flexion weakness (chronic, has LBP and sciatica). Otherwise strength is V/V in the upper and lower limbs.      Sensation: intact to LT     Reflex Exam:  DTR's:    Deep tendon reflexes in the upper and lower extremities are equivocal  bilaterally.   Toes:    The toes are equivocal bilaterally.   Clonus:    Clonus is absent.       Assessment/Plan:  75 y.o. female here as a referral from Dr. Maudie Mercury for syncope. 75 year old female with a number of cardiovascular risk factors including insulin-dependent diabetes, dyslipidemia, hypertension, bradycardia, progressive dyspnea and exertion,  and morbid obesity. With episodes of unusual transient alteration of consciousness.   - she is scheduled for evaluation of sleep apnea. Recommend testing for narcolepsy as well. Asked her to discuss with her sleep doctor.  - f/u 3 months, in themeantime: - seizure workup including MRI of the brain, routine eeg and then possible a 3-day video eeg -Patient is unable to drive, operate heavy machinery, perform activities at heights or participate in water activities until 6 months seizure/episode free - continue follow up with cardiology    Sarina Ill, MD  [facility] [address] [place], [place] [address]  Phone [phone] Fax [phone]

## 2016-04-13 NOTE — Patient Instructions (Signed)
Remember to drink plenty of fluid, eat healthy meals and do not skip any meals. Try to eat protein with a every meal and eat a healthy snack such as fruit or nuts in between meals. Try to keep a regular sleep-wake schedule and try to exercise daily, particularly in the form of walking, 20-30 minutes a day, if you can.   As far as diagnostic testing: MRI brain, eeg  I would like to see you back in 3 months, sooner if we need to. Please call us with any interim questions, concerns, problems, updates or refill requests.   Our phone number is [phone]. We also have an after hours call service for urgent matters and there is a physician on-call for urgent questions. For any emergencies you know to call 911 or go to the nearest emergency room

## 2016-04-14 ENCOUNTER — Other Ambulatory Visit: Payer: Self-pay | Admitting: Family Medicine

## 2016-04-17 ENCOUNTER — Ambulatory Visit (INDEPENDENT_AMBULATORY_CARE_PROVIDER_SITE_OTHER): Payer: Medicare Other | Admitting: *Deleted

## 2016-04-17 DIAGNOSIS — R55 Syncope and collapse: Secondary | ICD-10-CM | POA: Diagnosis not present

## 2016-04-17 NOTE — Progress Notes (Signed)
Carelink Summary Report / Loop Recorder 

## 2016-04-18 ENCOUNTER — Other Ambulatory Visit: Payer: Self-pay | Admitting: Family Medicine

## 2016-04-20 ENCOUNTER — Telehealth: Payer: Self-pay | Admitting: Neurology

## 2016-04-20 MED ORDER — ALPRAZOLAM 0.5 MG PO TABS: 0.5000 mg | ORAL_TABLET | ORAL | Status: DC | PRN

## 2016-04-20 NOTE — Telephone Encounter (Signed)
i rx'd xanax tabs for scan. -VRP

## 2016-04-20 NOTE — Telephone Encounter (Signed)
Tried calling pt. Unable to LVM. Please let pt know rx sent to pharmacy if she calls.

## 2016-04-20 NOTE — Telephone Encounter (Signed)
Patient called requesting either xanax or valium before her MRI on 5/2 @ [facility]. Patient can be reached @ [phone]. Thanks!

## 2016-04-20 NOTE — Addendum Note (Signed)
Addended byAndrey Spearman on: 04/20/2016 03:04 PM   Modules accepted: Orders

## 2016-04-21 ENCOUNTER — Ambulatory Visit (INDEPENDENT_AMBULATORY_CARE_PROVIDER_SITE_OTHER): Payer: Medicare Other | Admitting: Family Medicine

## 2016-04-21 ENCOUNTER — Encounter: Payer: Self-pay | Admitting: Family Medicine

## 2016-04-21 VITALS — BP 142/86 | HR 88 | Temp 98.4°F | Ht 61.5 in | Wt 238.7 lb

## 2016-04-21 DIAGNOSIS — I10 Essential (primary) hypertension: Secondary | ICD-10-CM

## 2016-04-21 DIAGNOSIS — Z794 Long term (current) use of insulin: Secondary | ICD-10-CM | POA: Diagnosis not present

## 2016-04-21 DIAGNOSIS — J069 Acute upper respiratory infection, unspecified: Secondary | ICD-10-CM | POA: Diagnosis not present

## 2016-04-21 DIAGNOSIS — E1159 Type 2 diabetes mellitus with other circulatory complications: Secondary | ICD-10-CM

## 2016-04-21 MED ORDER — BENZONATATE 100 MG PO CAPS: 100.0000 mg | ORAL_CAPSULE | Freq: Three times a day (TID) | ORAL | Status: DC | PRN

## 2016-04-21 NOTE — Patient Instructions (Addendum)
Please keep your follow-up as scheduled.  Please go to the pharmacy today and restart her clonidine.  Please schedule diabetic eye exam if not already done.  Please avoid hot drops or cough medications that contain sugar. Please buy sugar-free cough drops. Also, I sent a cough medication to your pharmacy.  INSTRUCTIONS FOR UPPER RESPIRATORY INFECTION:  -plenty of rest and fluids  -nasal saline wash 2-3 times daily (use prepackaged nasal saline or bottled/distilled water if making your own)   -can use AFRIN nasal spray for drainage and nasal congestion - but do NOT use longer then 4 days  -for sore throat, salt water gargles can help  -follow up if you have fevers, facial pain, tooth pain, difficulty breathing or are worsening or symptoms persist longer then expected  Upper Respiratory Infection, Adult An upper respiratory infection (URI) is also known as the common cold. It is often caused by a type of germ (virus). Colds are easily spread (contagious). You can pass it to others by kissing, coughing, sneezing, or drinking out of the same glass. Usually, you get better in 1 to 3  weeks.  However, the cough can last for even longer. HOME CARE   Only take medicine as told by your doctor. Follow instructions provided above.  Drink enough water and fluids to keep your pee (urine) clear or pale yellow.  Get plenty of rest.  Return to work when your temperature is < 100 for 24 hours or as told by your doctor. You may use a face mask and wash your hands to stop your cold from spreading. GET HELP RIGHT AWAY IF:   After the first few days, you feel you are getting worse.  You have questions about your medicine.  You have chills, shortness of breath, or red spit (mucus).  You have pain in the face for more then 1-2 days, especially when you bend forward.  You have a fever, puffy (swollen) neck, pain when you swallow, or white spots in the back of your throat.  You have a bad headache,  ear pain, sinus pain, or chest pain.  You have a high-pitched whistling sound when you breathe in and out (wheezing).  You cough up blood.  You have sore muscles or a stiff neck. MAKE SURE YOU:   Understand these instructions.  Will watch your condition.  Will get help right away if you are not doing well or get worse. Document Released: 05/29/2008 Document Revised: 03/04/2012 Document Reviewed: 03/18/2014 [facility] Patient Information 2015 [place], [place]. This information is not intended to replace advice given to you by your health care provider. Make sure you discuss any questions you have with your health care provider.

## 2016-04-21 NOTE — Progress Notes (Signed)
HPI:  Acute visit for:  URI: -started: 1 week ago -symptoms:nasal congestion, sore throat, cough -denies:fever, SOB, NVD, tooth pain -has tried: alkaseltzer and cough drops - they have sugar so BS high 100s when she took this - her diabetes has been well controlled otherwise, no lows -sick contacts/travel/risks: no reported flu, strep or tick exposure  HTN: -she ran out of her clonidine 2 days ago, agrees to go pick up today -denies: HA, CP, SOB  ROS: See pertinent positives and negatives per HPI.  Past Medical History  Diagnosis Date  . Hypertension   . Hyperlipidemia   . Diabetes mellitus without complication ([place])   . Thyroid disease   . Gout 06/30/2013  . Depression 06/30/2013  . GERD (gastroesophageal reflux disease)   . Spinal stenosis of lumbar region     s/p spine surgery 02/12/13 with Dr Marcos Eke at [facility]  . Anemia   . Arthritis   . Cancer (HCC)     cervical  . Chicken pox   . Diverticulitis   . Chronic bronchitis ([place])   . Frequent headaches   . Colon polyp   . Urine incontinence     Past Surgical History  Procedure Laterality Date  . Back surgery  01/2013  . Cervical cone biopsy    . Abdominal hysterectomy  1970    complete hysterectomy for cervical cancer  . Back surgery  1995  . Wrist surgery  2010    for wrist fracture  . Ep implantable device N/A 02/15/2016    Procedure: Loop Recorder Insertion;  Surgeon: Sanda Klein, MD;  Location: [facility];  Service: Cardiovascular;  Laterality: N/A;    Family History  Problem Relation Age of Onset  . Alcohol abuse Father   . Cirrhosis Father   . Heart attack Brother   . Arthritis      parents  . Hyperlipidemia      parent  . Hypertension      parent/grandparent  . Diabetes      parent/grandparent  . Breast cancer Sister   . Heart disease Other   . Mental illness Other   . Colon cancer Neg Hx   . Esophageal cancer Neg Hx   . Prostate cancer Neg Hx   . Rectal cancer Neg Hx   . Seizures  Neg Hx   . Cancer Mother     renal cancer  . Stroke Paternal Grandmother   . Alcohol abuse Brother   . Cancer Sister     renal cancer  . Diabetes Brother   . Heart Problems Daughter   . Healthy Daughter   . Healthy Daughter   . Healthy Son   . Healthy Son     Social History   Social History  . Marital Status: Divorced    Spouse Name: N/A  . Number of Children: 5  . Years of Education: 20   Occupational History  . Retired    Social History Main Topics  . Smoking status: Former Smoker    Quit date: 12/25/1964  . Smokeless tobacco: Never Used  . Alcohol Use: Yes     Comment: occ with steak dinner   . Drug Use: No  . Sexual Activity: Not Asked   Other Topics Concern  . 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  ACCU-CHEK FASTCLIX LANCETS MISC, Use as directed, Disp: 100 each, Rfl: 1 .  ACCU-CHEK SMARTVIEW test strip, USE AS INSTRUCTED TO CHECK BLOOD SUGAR TWICE A DAY, Disp: 100 each, Rfl: 2 .  acetaminophen (TYLENOL) 500 MG tablet, Take 1,000 mg by mouth every 8 (eight) hours as needed., Disp: , Rfl:  .  allopurinol (ZYLOPRIM) 300 MG tablet, TAKE 1 TABLET BY MOUTH ONCE DAILY, Disp: 30 tablet, Rfl: 1 .  ALPRAZolam (XANAX) 0.5 MG tablet, Take 1 tablet (0.5 mg total) by mouth as needed for anxiety (for sedation before MRI scan; take 1 hour before scan; may repeat 15 min before scan)., Disp: 3 tablet, Rfl: 0 .  amLODipine (NORVASC) 5 MG tablet, TAKE ONE TABLET BY MOUTH ONCE DAILY, Disp: 90 tablet, Rfl: 1 .  aspirin 81 MG tablet, Take 81 mg by mouth daily., Disp: , Rfl:  .  B-D UF III MINI PEN NEEDLES 31G X 5 MM MISC, USE AS DIRECTED, Disp: 100 each, Rfl: 1 .  Blood  Glucose Calibration (ACCU-CHEK AVIVA) SOLN, Use as directed., Disp: 1 each, Rfl: 1 .  calcium-vitamin D (OSCAL 500/200 D-3) 500-200 MG-UNIT per tablet, Take 1 tablet by mouth daily., Disp: , Rfl:  .  cloNIDine (CATAPRES) 0.1 MG tablet, TAKE 1 TABLET BY MOUTH TWICE A DAY, Disp: 60 tablet, Rfl: 2 .  ferrous sulfate 325 (65 FE) MG tablet, Take 325 mg by mouth daily with breakfast., Disp: , Rfl:  .  glucose blood (ACCU-CHEK SMARTVIEW) test strip, Use as instructed to check blood sugar twice a day, Disp: 100 each, Rfl: 1 .  hydrALAZINE (APRESOLINE) 25 MG tablet, TAKE 1 TABLET (25 MG TOTAL) BY MOUTH 2 (TWO) TIMES DAILY., Disp: 60 tablet, Rfl: 3 .  Insulin Lispro Prot & Lispro (HUMALOG MIX 75/25 KWIKPEN) (75-25) 100 UNIT/ML Kwikpen, Inject 10 Units into the skin 2 (two) times daily before a meal., Disp: 15 mL, Rfl: 11 .  isosorbide dinitrate (ISORDIL) 20 MG tablet, TAKE 1 TABLET (20 MG TOTAL) BY MOUTH 2 (TWO) TIMES DAILY., Disp: 60 tablet, Rfl: 3 .  levothyroxine (SYNTHROID, LEVOTHROID) 137 MCG tablet, TAKE ONE TABLET BY MOUTH ONCE DAILY BEFORE  BREAKFAST, Disp: 90 tablet, Rfl: 1 .  losartan (COZAAR) 50 MG tablet, Take 1 tablet (50 mg total) by mouth daily., Disp: 90 tablet, Rfl: 3 .  metFORMIN (GLUCOPHAGE) 1000 MG tablet, TAKE 1 TABLET (1,000 MG TOTAL) BY MOUTH 2 (TWO) TIMES DAILY WITH A MEAL., Disp: 180 tablet, Rfl: 1 .  Sennosides-Docusate Sodium (STOOL SOFTENER & LAXATIVE PO), Take 1 tablet by mouth daily as needed (constipation). , Disp: , Rfl:  .  simvastatin (ZOCOR) 20 MG tablet, TAKE 1 TABLET BY MOUTH EVERY DAY, Disp: 90 tablet, Rfl: 1 .  venlafaxine (EFFEXOR) 37.5 MG tablet, TAKE 1 TABLET BY MOUTH TWICE A DAY, Disp: 60 tablet, Rfl: 3 .  vitamin B-12 (CYANOCOBALAMIN) 1000 MCG tablet, Take 1,000 mcg by mouth daily., Disp: , Rfl:  .  benzonatate (TESSALON) 100 MG capsule, Take 1 capsule (100 mg total) by mouth 3 (three) times daily as needed for cough., Disp: 20 capsule, Rfl: 0  EXAM:  Filed  Vitals:   04/21/16 1354  BP: 142/86  Pulse: 88  Temp: 98.4  F (36.9 C)    Body mass index is 44.38 kg/(m^2).  GENERAL: vitals reviewed and listed above, alert, oriented, appears well hydrated and in no acute distress  HEENT: atraumatic, conjunttiva clear, no obvious abnormalities on inspection of external nose and ears, normal appearance of ear canals and TMs, clear nasal congestion, mild post oropharyngeal erythema with PND, no tonsillar edema or exudate, no sinus TTP  NECK: no obvious masses on inspection  LUNGS: clear to auscultation bilaterally, no wheezes, rales or rhonchi, good air movement  CV: HRRR, no peripheral edema  MS: moves all extremities without noticeable abnormality  PSYCH: pleasant and cooperative, no obvious depression or anxiety  ASSESSMENT AND PLAN:  Discussed the following assessment and plan:  Acute upper respiratory infection  Essential hypertension, benign  Type 2 diabetes mellitus with other circulatory complication, with long-term current use of insulin (HCC)  -restart clonidine, advised she should never stop this medication and discussed rebound hypertension -Advised sugar-free products for cough to avoid elevation of her blood sugars -given HPI and exam findings today, a serious infection or illness is unlikely. We discussed potential etiologies, with VURI being most likely, and advised supportive care and monitoring. We discussed treatment side effects, likely course, antibiotic misuse, transmission, and signs of developing a serious illness. -of course, we advised to return or notify a doctor immediately if symptoms worsen or persist or new concerns arise.    Patient Instructions  Please keep your follow-up as scheduled.  Please go to the pharmacy today and restart her clonidine.  Please avoid hot drops or cough medications that contain sugar. Please buy sugar-free cough drops. Also, I sent a cough medication to your  pharmacy.  INSTRUCTIONS FOR UPPER RESPIRATORY INFECTION:  -plenty of rest and fluids  -nasal saline wash 2-3 times daily (use prepackaged nasal saline or bottled/distilled water if making your own)   -can use AFRIN nasal spray for drainage and nasal congestion - but do NOT use longer then 4 days  -for sore throat, salt water gargles can help  -follow up if you have fevers, facial pain, tooth pain, difficulty breathing or are worsening or symptoms persist longer then expected  Upper Respiratory Infection, Adult An upper respiratory infection (URI) is also known as the common cold. It is often caused by a type of germ (virus). Colds are easily spread (contagious). You can pass it to others by kissing, coughing, sneezing, or drinking out of the same glass. Usually, you get better in 1 to 3  weeks.  However, the cough can last for even longer. HOME CARE   Only take medicine as told by your doctor. Follow instructions provided above.  Drink enough water and fluids to keep your pee (urine) clear or pale yellow.  Get plenty of rest.  Return to work when your temperature is < 100 for 24 hours or as told by your doctor. You may use a face mask and wash your hands to stop your cold from spreading. GET HELP RIGHT AWAY IF:   After the first few days, you feel you are getting worse.  You have questions about your medicine.  You have chills, shortness of breath, or red spit (mucus).  You have pain in the face for more then 1-2 days, especially when you bend forward.  You have a fever, puffy (swollen) neck, pain when you swallow, or white spots in the back of your throat.  You have a bad headache, ear pain, sinus pain, or chest pain.  You have  a high-pitched whistling sound when you breathe in and out (wheezing).  You cough up blood.  You have sore muscles or a stiff neck. MAKE SURE YOU:   Understand these instructions.  Will watch your condition.  Will get help right away if you  are not doing well or get worse. Document Released: 05/29/2008 Document Revised: 03/04/2012 Document Reviewed: 03/18/2014 [facility] Patient Information 2015 [place], [place]. This information is not intended to replace advice given to you by your health care provider. Make sure you discuss any questions you have with your health care provider.      Colin Benton R.

## 2016-04-21 NOTE — Progress Notes (Signed)
Pre visit review using our clinic review tool, if applicable. No additional management support is needed unless otherwise documented below in the visit note. 

## 2016-04-24 ENCOUNTER — Institutional Professional Consult (permissible substitution): Payer: Medicare Other | Admitting: Pulmonary Disease

## 2016-04-24 ENCOUNTER — Telehealth: Payer: Self-pay | Admitting: Family Medicine

## 2016-04-24 MED ORDER — BENZONATATE 100 MG PO CAPS: 100.0000 mg | ORAL_CAPSULE | Freq: Three times a day (TID) | ORAL | Status: DC | PRN

## 2016-04-24 NOTE — Telephone Encounter (Signed)
I called the pt and informed her of the message below and she stated she does not feel worse, denies any breathing problems and would like a refill on the cough medication.  Dr Maudie Mercury approved a refill and this was sent to her pharmacy.

## 2016-04-24 NOTE — Telephone Encounter (Signed)
Pt saw Dr Maudie Mercury on Friday 4/28 and states she is not better.  Pt is almost out of her Rx that was given and   would like to know if she needs another refill or a different med? Pt still has a bad cough.  [place]

## 2016-04-24 NOTE — Telephone Encounter (Signed)
Colds last about 1-3 weeks. Could refill the cough medication if she needs and advise cough drops. However, if she is feeling worse, having trouble breathing for think she is getting sicker I would recommend follow-up to reevaluate.

## 2016-04-25 ENCOUNTER — Other Ambulatory Visit: Payer: Medicare Other

## 2016-04-27 ENCOUNTER — Other Ambulatory Visit: Payer: Medicare Other

## 2016-05-03 ENCOUNTER — Other Ambulatory Visit: Payer: Medicare Other

## 2016-05-12 ENCOUNTER — Ambulatory Visit
Admission: RE | Admit: 2016-05-12 | Discharge: 2016-05-12 | Disposition: A | Discharge status: Home / Self Care | Payer: Medicare Other | Source: Ambulatory Visit | Attending: Neurology | Admitting: Neurology

## 2016-05-12 DIAGNOSIS — R404 Transient alteration of awareness: Secondary | ICD-10-CM

## 2016-05-12 MED ORDER — GADOBENATE DIMEGLUMINE 529 MG/ML IV SOLN
20.0000 mL | Freq: Once | INTRAVENOUS | Status: AC | PRN
  Administered 2016-05-12: 20 mL via INTRAVENOUS

## 2016-05-15 ENCOUNTER — Ambulatory Visit (INDEPENDENT_AMBULATORY_CARE_PROVIDER_SITE_OTHER): Payer: Medicare Other | Admitting: *Deleted

## 2016-05-15 DIAGNOSIS — R55 Syncope and collapse: Secondary | ICD-10-CM | POA: Diagnosis not present

## 2016-05-16 NOTE — Progress Notes (Signed)
Carelink Summary Report / Loop Recorder 

## 2016-05-17 ENCOUNTER — Other Ambulatory Visit: Payer: Self-pay | Admitting: Family Medicine

## 2016-05-17 MED ORDER — METFORMIN HCL 1000 MG PO TABS: ORAL_TABLET | ORAL | Status: DC

## 2016-05-20 LAB — CUP PACEART REMOTE DEVICE CHECK: MDC IDC SESS DTM: 20170323180823

## 2016-05-20 NOTE — Progress Notes (Signed)
Carelink summary report received. Battery status OK. Normal device function. No new symptom episodes, brady episodes. See in-clinic note 3/16 for arrhythmia descriptions.  Monthly summary reports and ROV/PRN

## 2016-05-22 LAB — CUP PACEART REMOTE DEVICE CHECK: MDC IDC SESS DTM: 20170422183541

## 2016-05-22 NOTE — Progress Notes (Signed)
Carelink summary report received. Battery status OK. Normal device function. No new symptom episodes, tachy episodes, brady, or pause episodes. No new AF episodes. Monthly summary reports and ROV/PRN 

## 2016-05-23 ENCOUNTER — Ambulatory Visit (INDEPENDENT_AMBULATORY_CARE_PROVIDER_SITE_OTHER): Payer: Medicare Other | Admitting: Diagnostic Neuroimaging

## 2016-05-23 ENCOUNTER — Other Ambulatory Visit: Payer: Medicare Other

## 2016-05-23 DIAGNOSIS — R404 Transient alteration of awareness: Secondary | ICD-10-CM

## 2016-05-23 DIAGNOSIS — R55 Syncope and collapse: Secondary | ICD-10-CM

## 2016-05-26 ENCOUNTER — Encounter: Payer: Self-pay | Admitting: Cardiovascular Disease

## 2016-05-31 NOTE — Procedures (Signed)
   [facility]  EEG (ELECTROENCEPHALOGRAM) REPORT   STUDY DATE: 05/23/16 PATIENT NAME: LEANIE WUETHRICH DOB: 09/08/1941 MRN: [id number]  ORDERING CLINICIAN: Sarina Ill, MD   TECHNOLOGIST: Laretta Alstrom  TECHNIQUE: Electroencephalogram was recorded utilizing standard 10-20 system of lead placement and reformatted into average and bipolar montages.  RECORDING TIME: 30 minutes ACTIVATION: none  CLINICAL INFORMATION: 75 year old female with abnormal spells.   FINDINGS: Background rhythms of 8-9 hertz and 15-20 microvolts. No focal, lateralizing, epileptiform activity or seizures are seen. Patient recorded in the awake and drowsy state. EKG channel shows regular rhythm of 70-80 beats per minute. EKG artifact noted throughout the study.    IMPRESSION:  Normal EEG in the awake and drowsy state.     INTERPRETING PHYSICIAN:  Penni Bombard, MD Certified in Neurology, Neurophysiology and Neuroimaging  [facility] [address] [place], [place] [address] [phone]

## 2016-06-06 ENCOUNTER — Telehealth: Payer: Self-pay | Admitting: *Deleted

## 2016-06-06 NOTE — Telephone Encounter (Signed)
Received physician status notification from [facility] that they received referral and they will contact us once pt is scheduled for 72hr AM[facility] EEG.   

## 2016-06-06 NOTE — Telephone Encounter (Signed)
-----   Message from Melvenia Beam, MD sent at 05/31/2016  7:52 PM EDT ----- Terrence Dupont, patient's routine EEG is normal. I would ask her to consider a 3-day eeg at home as one routing eeg over an hour does not rule out seizures. Please let me know and if she would like the 3-day eeg then fill out the [facility] form and I can sign it thanks

## 2016-06-06 NOTE — Telephone Encounter (Signed)
Dr Jaynee Eagles- see message below Called and spoke to pt about normal EEG results. She is okay with proceeding with 3-day in home EEG. She wants to wait until next month to schedule. She has other obligations she stated this month. Advised I will send message to Dr Jaynee Eagles. She will be getting a call from [facility] to schedule 3-day EEG. She verbalized understanding.

## 2016-06-06 NOTE — Telephone Encounter (Signed)
Faxed completed form to [facility] to schedule pt for in-home 72-hour EEG. Fax: [phone]. Received confirmation.   

## 2016-06-07 ENCOUNTER — Ambulatory Visit: Payer: Medicare Other | Admitting: Internal Medicine

## 2016-06-07 NOTE — Telephone Encounter (Signed)
Dr Jaynee EaglesJuluis Rainier Received physician status notification from [facility] . Pt is scheduled 08/04/16-08/07/16. Pt requested apt to be scheduled after 07/29/16.

## 2016-06-08 ENCOUNTER — Other Ambulatory Visit: Payer: Self-pay | Admitting: Family Medicine

## 2016-06-09 ENCOUNTER — Other Ambulatory Visit: Payer: Self-pay | Admitting: Family Medicine

## 2016-06-14 ENCOUNTER — Ambulatory Visit (INDEPENDENT_AMBULATORY_CARE_PROVIDER_SITE_OTHER): Payer: Medicare Other | Admitting: *Deleted

## 2016-06-14 DIAGNOSIS — R55 Syncope and collapse: Secondary | ICD-10-CM

## 2016-06-15 ENCOUNTER — Other Ambulatory Visit: Payer: Self-pay | Admitting: Family Medicine

## 2016-06-15 NOTE — Progress Notes (Signed)
Carelink Summary Report / Loop Recorder 

## 2016-06-23 ENCOUNTER — Encounter: Payer: Self-pay | Admitting: Neurology

## 2016-06-24 LAB — CUP PACEART REMOTE DEVICE CHECK
Date Time Interrogation Session: 20170522183836
Date Time Interrogation Session: 20170621190527

## 2016-06-26 ENCOUNTER — Ambulatory Visit: Payer: Medicare Other | Admitting: Internal Medicine

## 2016-07-12 ENCOUNTER — Other Ambulatory Visit: Payer: Self-pay | Admitting: Family Medicine

## 2016-07-13 ENCOUNTER — Ambulatory Visit: Payer: Medicare Other | Admitting: Neurology

## 2016-07-14 ENCOUNTER — Ambulatory Visit (INDEPENDENT_AMBULATORY_CARE_PROVIDER_SITE_OTHER): Payer: Medicare Other | Admitting: *Deleted

## 2016-07-14 ENCOUNTER — Institutional Professional Consult (permissible substitution): Payer: Medicare Other | Admitting: Pulmonary Disease

## 2016-07-14 DIAGNOSIS — R55 Syncope and collapse: Secondary | ICD-10-CM | POA: Diagnosis not present

## 2016-07-17 NOTE — Progress Notes (Signed)
Carelink Summary Report / Loop Recorder 

## 2016-07-18 ENCOUNTER — Ambulatory Visit: Payer: Medicare Other | Admitting: Family Medicine

## 2016-07-28 ENCOUNTER — Encounter: Payer: Self-pay | Admitting: Internal Medicine

## 2016-07-31 ENCOUNTER — Ambulatory Visit: Payer: Medicare Other | Admitting: Internal Medicine

## 2016-08-10 LAB — CUP PACEART REMOTE DEVICE CHECK: Date Time Interrogation Session: 20170721193820

## 2016-08-12 NOTE — Progress Notes (Signed)
HPI:  Katie Mcintyre is a pleasant 75 yo with a complicated past medical history and a history of poor compliance. Barriers to care include: financial restrictions, feels she does not have the time, energy or money to care for herself as diverts these resources to caring for her family.  DM: -circulatory complications -meds: metformin, asa, losartan, mixed humolog 75/25 -switched to mixed insulin 04/2015 -reports: doing 10 units of humolog twice daily, BS in the 80-140s ranges fasting and premeal on average, no low blood sugars - brings detailed log -not exercising now, trying to eat healthy -denies: hypoglycemia, vision issues, polyuria -eye exam: sees Dr. Zadie Rhine, exam not done this year - agrees to schedule Hgba1c 6.8 12/2015  HLD/Obesity: -meds simvastatin 20 -denies: leg cramps or cog impairment -stopped exercising -has not had fasting lipids in some time, forgets to come fasting - not fasting today  HTN/Chronic LE edema: -meds: isosorbide, hydralazine, clonidine, metoprolol, losartan, norvasc -denies: CP, SOB, DOE, palpitiations -chronic LE edema intermittently - non-compliant with compression  Hypothyroid:  -back in normal range last check -reports:feeling better -denies: hot/cold intol, change in weight  Anemia:  Stable on iron therapy from prior PCP Had colonoscopy with polpys, repeat advised in 08/2016   Hx Gout: -on allopurinol from prior PCP  Depression: -meds: none currently -reports doing ok, feels does not need treatment -denies: depression, panic, SI  Hx Pre-syncope/AMS events: -s/p eval with neurology and cardiology in 2017 with [facility], has implanted loop recorder, EEG and pending 72 hour EEG -on review OSH records hx abnormal EKG, 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 Marcos Eke at [facility] for this -has MRI in sept  HM: due for colonoscopy - agrees to schedule, optho - agrees to schedule, flu shot - plans to get at preventive  visit, hgba1c - today, ?dexa - will consider and she plans to discuss at preventive visit  ROS: See pertinent positives and negatives per HPI.  Past Medical History:  Diagnosis Date  . Anemia   . Arthritis   . Cancer (HCC)    cervical  . Chicken pox   . Chronic bronchitis ([place])   . Colon polyp   . Depression 06/30/2013  . Diabetes mellitus without complication ([place])   . Diverticulitis   . Frequent headaches   . GERD (gastroesophageal reflux disease)   . Gout 06/30/2013  . Hyperlipidemia   . Hypertension   . Spinal stenosis of lumbar region    s/p spine surgery 02/12/13 with Dr Marcos Eke at [facility]  . Thyroid disease   . Urine incontinence     Past Surgical History:  Procedure Laterality Date  . ABDOMINAL HYSTERECTOMY  1970   complete hysterectomy for cervical cancer  . BACK SURGERY  01/2013  . BACK SURGERY  1995  . CERVICAL CONE BIOPSY    . EP IMPLANTABLE DEVICE N/A 02/15/2016   Procedure: Loop Recorder Insertion;  Surgeon: Sanda Klein, MD;  Location: [facility];  Service: Cardiovascular;  Laterality: N/A;  . WRIST SURGERY  2010   for wrist fracture    Family History  Problem Relation Age of Onset  . Alcohol abuse Father   . Cirrhosis Father   . Heart attack Brother   . Arthritis      parents  . Hyperlipidemia      parent  . Hypertension      parent/grandparent  . Diabetes      parent/grandparent  . Breast cancer Sister   . Heart  disease Other   . Mental illness Other   . Colon cancer Neg Hx   . Esophageal cancer Neg Hx   . Prostate cancer Neg Hx   . Rectal cancer Neg Hx   . Seizures Neg Hx   . Cancer Mother     renal cancer  . Stroke Paternal Grandmother   . Alcohol abuse Brother   . Cancer Sister     renal cancer  . Diabetes Brother   . Heart Problems Daughter   . Healthy Daughter   . Healthy Daughter   . Healthy Son   . Healthy Son     Social History   Social History  . Marital status: Divorced    Spouse name: N/A  . Number of  children: 5  . Years of education: 20   Occupational History  . Retired    Social History Main Topics  . Smoking status: Former Smoker    Quit date: 12/25/1964  . Smokeless tobacco: Never Used  . Alcohol use Yes     Comment: occ with steak dinner   . Drug use: No  . Sexual activity: Not Asked   Other Topics Concern  . 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  ACCU-CHEK FASTCLIX LANCETS MISC, Use as directed, Disp: 100 each, Rfl: 1 .  ACCU-CHEK SMARTVIEW test strip, USE AS INSTRUCTED TO CHECK BLOOD SUGAR TWICE A DAY, Disp: 100 each, Rfl: 2 .  acetaminophen (TYLENOL) 500 MG tablet, Take 1,000 mg by mouth every 8 (eight) hours as needed., Disp: , Rfl:  .  allopurinol (ZYLOPRIM) 300 MG tablet, TAKE 1 TABLET BY MOUTH ONCE DAILY, Disp: 30 tablet, Rfl: 1 .  ALPRAZolam (XANAX) 0.5 MG tablet, Take 1 tablet (0.5 mg total) by mouth as needed for anxiety (for sedation before MRI scan; take 1 hour before scan; may repeat 15 min before scan)., Disp: 3 tablet, Rfl: 0 .  amLODipine (NORVASC) 5 MG tablet, TAKE ONE TABLET BY MOUTH ONCE DAILY, Disp: 90 tablet, Rfl: 1 .  aspirin 81 MG tablet, Take 81 mg by mouth daily., Disp: , Rfl:  .  Blood Glucose Calibration (ACCU-CHEK AVIVA) SOLN, Use as directed., Disp: 1 each, Rfl: 1 .  calcium-vitamin D (OSCAL 500/200 D-3) 500-200 MG-UNIT per tablet, Take 1 tablet by mouth daily., Disp: , Rfl:  .  cloNIDine (CATAPRES) 0.1 MG tablet, TAKE 1 TABLET BY MOUTH TWICE A DAY, Disp: 60 tablet, Rfl: 2 .  ferrous sulfate 325 (65 FE) MG tablet, Take 325 mg by mouth daily with breakfast., Disp: , Rfl:  .  glucose blood (ACCU-CHEK SMARTVIEW) test strip, Use as instructed  to check blood sugar twice a day, Disp: 100 each, Rfl: 1 .  hydrALAZINE (APRESOLINE) 25 MG tablet, TAKE 1 TABLET (25 MG TOTAL) BY MOUTH 2 (TWO) TIMES DAILY., Disp: 60 tablet, Rfl: 3 .  Insulin Lispro Prot & Lispro (HUMALOG MIX 75/25 KWIKPEN) (75-25) 100 UNIT/ML Kwikpen, Inject 10 Units into the skin 2 (two) times daily before a meal., Disp: 15 mL, Rfl: 11 .  Insulin Pen Needle (B-D UF III MINI PEN NEEDLES) 31G X 5 MM MISC, USE TO INJECT INSULIN TWICE A DAY, Disp: 100 each, Rfl: 4 .  isosorbide dinitrate (ISORDIL) 20 MG tablet, TAKE 1 TABLET (20 MG TOTAL) BY MOUTH 2 (TWO) TIMES DAILY., Disp: 60 tablet, Rfl: 3 .  isosorbide dinitrate (ISORDIL) 20 MG tablet, TAKE 1 TABLET (20 MG TOTAL) BY MOUTH 2 (TWO) TIMES DAILY., Disp: 60 tablet, Rfl: 3 .  levothyroxine (SYNTHROID, LEVOTHROID) 137 MCG tablet, TAKE ONE TABLET BY MOUTH ONCE DAILY BEFORE  BREAKFAST, Disp: 90 tablet, Rfl: 1 .  losartan (COZAAR) 50 MG tablet, TAKE ONE TABLET BY MOUTH ONCE DAILY, Disp: 90 tablet, Rfl: 0 .  metFORMIN (GLUCOPHAGE) 1000 MG tablet, TAKE 1 TABLET (1,000 MG TOTAL) BY MOUTH 2 (TWO) TIMES DAILY WITH A MEAL., Disp: 180 tablet, Rfl: 1 .  Sennosides-Docusate Sodium (STOOL SOFTENER & LAXATIVE PO), Take 1 tablet by mouth daily as needed (constipation). , Disp: , Rfl:  .  simvastatin (ZOCOR) 20 MG tablet, TAKE 1 TABLET BY MOUTH EVERY DAY, Disp: 90 tablet, Rfl: 1 .  vitamin B-12 (CYANOCOBALAMIN) 1000 MCG tablet, Take 1,000 mcg by mouth daily., Disp: , Rfl:   EXAM:  Vitals:   08/14/16 0947  BP: 118/62  Pulse: 87  Temp: 97.4 F (36.3 C)    Body mass index is 43.96 kg/m.  GENERAL: vitals reviewed and listed above, alert, oriented, appears well hydrated and in no acute distress  HEENT: atraumatic, conjunttiva clear, no obvious abnormalities on inspection of external nose and ears  NECK: no obvious masses on inspection  LUNGS: clear to auscultation bilaterally, no wheezes, rales or rhonchi, good air movement  CV: HRRR, tr  bilat LE peripheral edema  MS: moves all extremities without noticeable abnormality  PSYCH: pleasant and cooperative, no obvious depression or anxiety  ASSESSMENT AND PLAN:  Discussed the following assessment and plan:  Type 2 diabetes mellitus with other circulatory complication, with long-term current use of insulin (HCC) - Plan: Hemoglobin A1c  Hypothyroidism, unspecified hypothyroidism type - Plan: TSH  Essential hypertension, benign - Plan: Basic metabolic panel, CBC (no diff) -labs, lifestyle recs, cont current meds  Hyperlipemia - Plan: Lipid Panel -not fasting, do with medicare exam order placed  History of colonic polyps -advised to call GI to arrange, GI letter given to pt  Pre-syncope -undergoing eval with neuro, s/p eval with cardiology  Chronic back pain - managed by Dr. Marcos Eke at [facility] pending with specialist  Iron deficiency anemia -labs  -compression advised and showed how to apply -advised colonoscopy with GI, labs -advised eye exam -advised healthy diet and regular exercise -advised flu shot in sept or oct -Patient advised to return or notify a doctor immediately if symptoms worsen or persist or new concerns arise.  Patient Instructions  BEFORE YOU LEAVE: -follow up: 1) Medicare exam, fasting cholesterol lab, flu shot with [name] in next 2 months 2) Follow up with Dr. Maudie Mercury in 4-5 months -labs before you leave today  Call you gastroenterologist about setting up you colonoscopy.  Call your opthomologist about setting up your diabetic eye exam.  We recommend the following healthy lifestyle: 1) Small portions - eat off of salad plate instead of dinner plate 2) Eat a healthy clean diet with avoidance of (less then 1 serving per week) processed foods, sweetened drinks, white starches, red meat, fast foods and sweets and consisting of: * 5-9 servings per day of fresh or frozen fruits and vegetables (not corn or  potatoes, not dried or canned) *nuts  and seeds, beans *olives and olive oil *small portions of lean meats such as fish and white chicken  *small portions of whole grains 3)Get at least 150 minutes of sweaty aerobic exercise per week 4)reduce stress - counseling, meditation, relaxation to balance other aspects of your life    Colin Benton R., DO

## 2016-08-14 ENCOUNTER — Ambulatory Visit (INDEPENDENT_AMBULATORY_CARE_PROVIDER_SITE_OTHER): Payer: Medicare Other | Admitting: *Deleted

## 2016-08-14 ENCOUNTER — Encounter: Payer: Self-pay | Admitting: Internal Medicine

## 2016-08-14 ENCOUNTER — Ambulatory Visit (INDEPENDENT_AMBULATORY_CARE_PROVIDER_SITE_OTHER): Payer: Medicare Other | Admitting: Family Medicine

## 2016-08-14 ENCOUNTER — Encounter: Payer: Self-pay | Admitting: Family Medicine

## 2016-08-14 VITALS — BP 118/62 | HR 87 | Temp 97.4°F | Ht 61.5 in | Wt 236.5 lb

## 2016-08-14 DIAGNOSIS — M549 Dorsalgia, unspecified: Secondary | ICD-10-CM

## 2016-08-14 DIAGNOSIS — Z794 Long term (current) use of insulin: Secondary | ICD-10-CM

## 2016-08-14 DIAGNOSIS — Z860100 Personal history of colon polyps, unspecified: Secondary | ICD-10-CM

## 2016-08-14 DIAGNOSIS — R55 Syncope and collapse: Secondary | ICD-10-CM

## 2016-08-14 DIAGNOSIS — E785 Hyperlipidemia, unspecified: Secondary | ICD-10-CM | POA: Diagnosis not present

## 2016-08-14 DIAGNOSIS — I10 Essential (primary) hypertension: Secondary | ICD-10-CM

## 2016-08-14 DIAGNOSIS — D509 Iron deficiency anemia, unspecified: Secondary | ICD-10-CM

## 2016-08-14 DIAGNOSIS — E1159 Type 2 diabetes mellitus with other circulatory complications: Secondary | ICD-10-CM

## 2016-08-14 DIAGNOSIS — E039 Hypothyroidism, unspecified: Secondary | ICD-10-CM | POA: Diagnosis not present

## 2016-08-14 DIAGNOSIS — G8929 Other chronic pain: Secondary | ICD-10-CM

## 2016-08-14 LAB — BASIC METABOLIC PANEL
BUN: 22 mg/dL (ref 6–23)
CALCIUM: 9.2 mg/dL (ref 8.4–10.5)
CO2: 23 meq/L (ref 19–32)
Chloride: 102 mEq/L (ref 96–112)
Creatinine, Ser: 0.81 mg/dL (ref 0.40–1.20)
GFR: 88.32 mL/min (ref >60.00)
Glucose, Bld: 163 mg/dL — ABNORMAL HIGH (ref 70–99)
Potassium: 4.6 mEq/L (ref 3.5–5.1)
SODIUM: 137 meq/L (ref 135–145)

## 2016-08-14 LAB — CBC
HCT: 37.4 % (ref 36.0–46.0)
Hemoglobin: 12.4 g/dL (ref 12.0–15.0)
MCHC: 33.2 g/dL (ref 30.0–36.0)
MCV: 84.3 fl (ref 78.0–100.0)
PLATELETS: 307 10*3/uL (ref 150.0–400.0)
RBC: 4.44 Mil/uL (ref 3.87–5.11)
RDW: 16.1 % — ABNORMAL HIGH (ref 11.5–15.5)
WBC: 4.8 10*3/uL (ref 4.0–10.5)

## 2016-08-14 LAB — LIPID PANEL
CHOLESTEROL: 151 mg/dL (ref 0–200)
HDL: 47.4 mg/dL (ref >39.00)
LDL CALC: 64 mg/dL (ref 0–99)
NonHDL: 103.41
TRIGLYCERIDES: 195 mg/dL — AB (ref 0.0–149.0)
Total CHOL/HDL Ratio: 3
VLDL: 39 mg/dL (ref 0.0–40.0)

## 2016-08-14 LAB — TSH: TSH: 2.39 u[IU]/mL (ref 0.35–4.50)

## 2016-08-14 LAB — HEMOGLOBIN A1C: HEMOGLOBIN A1C: 7.3 % — AB (ref 4.6–6.5)

## 2016-08-14 NOTE — Progress Notes (Signed)
Carelink Summary Report / Loop Recorder 

## 2016-08-14 NOTE — Progress Notes (Signed)
Pre visit review using our clinic review tool, if applicable. No additional management support is needed unless otherwise documented below in the visit note. 

## 2016-08-14 NOTE — Addendum Note (Signed)
Addended by: Gari Crown D on: 08/14/2016 10:29 AM   Modules accepted: Orders

## 2016-08-14 NOTE — Patient Instructions (Addendum)
BEFORE YOU LEAVE: -follow up: 1) Medicare exam, fasting cholesterol lab, flu shot with [name] in next 2 months 2) Follow up with Dr. Maudie Mercury in 4-5 months -labs before you leave today  Call you gastroenterologist about setting up you colonoscopy.  Call your opthomologist about setting up your diabetic eye exam.  We recommend the following healthy lifestyle: 1) Small portions - eat off of salad plate instead of dinner plate 2) Eat a healthy clean diet with avoidance of (less then 1 serving per week) processed foods, sweetened drinks, white starches, red meat, fast foods and sweets and consisting of: * 5-9 servings per day of fresh or frozen fruits and vegetables (not corn or potatoes, not dried or canned) *nuts and seeds, beans *olives and olive oil *small portions of lean meats such as fish and white chicken  *small portions of whole grains 3)Get at least 150 minutes of sweaty aerobic exercise per week 4)reduce stress - counseling, meditation, relaxation to balance other aspects of your life

## 2016-08-16 ENCOUNTER — Other Ambulatory Visit: Payer: Self-pay | Admitting: Family Medicine

## 2016-08-17 ENCOUNTER — Ambulatory Visit (INDEPENDENT_AMBULATORY_CARE_PROVIDER_SITE_OTHER): Payer: Medicare Other | Admitting: Internal Medicine

## 2016-08-17 ENCOUNTER — Encounter: Payer: Self-pay | Admitting: Internal Medicine

## 2016-08-17 VITALS — BP 138/80 | HR 53 | Ht 61.5 in | Wt 237.8 lb

## 2016-08-17 DIAGNOSIS — Z95818 Presence of other cardiac implants and grafts: Secondary | ICD-10-CM | POA: Diagnosis not present

## 2016-08-17 DIAGNOSIS — R001 Bradycardia, unspecified: Secondary | ICD-10-CM

## 2016-08-17 DIAGNOSIS — R55 Syncope and collapse: Secondary | ICD-10-CM

## 2016-08-17 DIAGNOSIS — R0600 Dyspnea, unspecified: Secondary | ICD-10-CM

## 2016-08-17 DIAGNOSIS — R0609 Other forms of dyspnea: Secondary | ICD-10-CM | POA: Diagnosis not present

## 2016-08-17 DIAGNOSIS — I10 Essential (primary) hypertension: Secondary | ICD-10-CM

## 2016-08-17 NOTE — Patient Instructions (Signed)
Your physician wants you to follow-up in: 6 months with Dr. Debara Pickett. You will receive a reminder letter in the mail two months in advance. If you don't receive a letter, please call our office to schedule the follow-up appointment.  Your physician recommends that you continue on your current medications as directed. Please refer to the Current Medication list given to you today.

## 2016-08-17 NOTE — Progress Notes (Signed)
OFFICE NOTE  Chief Complaint:  Follow-up  Primary Care Physician: [name]., DO  HPI:  Katie Mcintyre is a pleasant 75 year old female with a number of cardiovascular risk factors including insulin-dependent diabetes, dyslipidemia, hypertension and morbid obesity. She recently described a couple of episodes when she was driving of presyncope/syncope. She was able to maintain control of the car but basically started to lose consciousness and was in a very afraid. She does not believe this is related to blood sugars although she said that she personally increased her Lantus around that time thinking that her blood sugars my been too high. Her primary care provider then decrease that. She denies any symptoms of heart racing or chest pain. She has reported some shortness of breath which is been progressively worse. An EKG in her primary care provider's office shows sinus bradycardia, left atrial enlargement and voltage criteria for LVH with nonspecific T-wave changes. Heart rate is 50.   [name] returns today for follow-up of her loop recorder. The device interrogation indicated 1 possible high rate episode for which she said she had some presyncopal symptoms while driving. There were 2 bradycardic episodes which were not captured due to under sensing. Neither of these episodes however were associated with her syncopal episode. Please refer to the device interrogation note, which is copied below for description of a recent event that she had which was witnessed during her device interrogation in our office:  "During appointment, noted that patient's eyes began to droop bilaterally and she stopped verbally responding mid-conversation. Patient had been sitting up in chair but was slumped with her chin resting on her chest by this time. Patient did not appear to lose consciousness, but her eyes remained half-open. Attempted orientation questions with patient, but she did not verbally  respond. Called patient's name and patient was able to fully open her eyes on command, but she had difficulty making eye contact initially. Elevated leg rest on chair and obtained vital signs--BP 182/81, HR 74, O2 sat 98%, CBG 145. By this time, 2-62min had passed. Patient was alert and oriented at this point. Patient reports that these are the same episodes she has been having. No rhythm abnormalities noted during this episode. She reports that she suddenly feels like she has no control over her body and she feels herself drifting away. Patient strongly advised to avoid driving unless instructed otherwise by a physician and she verbalizes understanding of instructions. Reviewed episode and vital signs with Dr. Tamala Julian (DOD) who advised that patient seek treatment at the ED as she is stable now. Patient declines transport to the hospital at this time as she feels these episodes are ongoing. Encouraged patient to seek follow-up immediately and patient states that she has upcoming appointments with Dr. Debara Pickett and Dr. Maudie Mercury. Advised patient to discuss possible neuro consult with PCP and patient verbalizes understanding of instructions."  08/17/2016  [name] returns today for follow-up. She reports no further episodes of presyncope. She's also had negative workup so far is to the etiology for this. EEG was negative. Her remote pacer checks of her loop recorder do not demonstrate any significant arrhythmias. I'm still concerned that she may have sleep disorder such as sleep apnea or narcolepsy. She is scheduled for sleep study at the end of September. Have highly encouraged her to keep this appointment.  PMHx:  Past Medical History:  Diagnosis Date  . Anemia   . Arthritis   . Cancer ([place])    cervical  .  Chicken pox   . Chronic bronchitis ([place])   . Colon polyp   . Depression 06/30/2013  . Diabetes mellitus without complication ([place])   . Diverticulitis   . Frequent headaches   . GERD  (gastroesophageal reflux disease)   . Gout 06/30/2013  . Hyperlipidemia   . Hypertension   . Spinal stenosis of lumbar region    s/p spine surgery 02/12/13 with Dr Marcos Eke at [facility]  . Thyroid disease   . Urine incontinence     Past Surgical History:  Procedure Laterality Date  . ABDOMINAL HYSTERECTOMY  1970   complete hysterectomy for cervical cancer  . BACK SURGERY  01/2013  . BACK SURGERY  1995  . CERVICAL CONE BIOPSY    . EP IMPLANTABLE DEVICE N/A 02/15/2016   Procedure: Loop Recorder Insertion;  Surgeon: Sanda Klein, MD;  Location: [facility];  Service: Cardiovascular;  Laterality: N/A;  . WRIST SURGERY  2010   for wrist fracture    FAMHx:  Family History  Problem Relation Age of Onset  . Alcohol abuse Father   . Cirrhosis Father   . Heart attack Brother   . Arthritis      parents  . Hyperlipidemia      parent  . Hypertension      parent/grandparent  . Diabetes      parent/grandparent  . Breast cancer Sister   . Heart disease Other   . Mental illness Other   . Colon cancer Neg Hx   . Esophageal cancer Neg Hx   . Prostate cancer Neg Hx   . Rectal cancer Neg Hx   . Seizures Neg Hx   . Cancer Mother     renal cancer  . Stroke Paternal Grandmother   . Alcohol abuse Brother   . Cancer Sister     renal cancer  . Diabetes Brother   . Heart Problems Daughter   . Healthy Daughter   . Healthy Daughter   . Healthy Son   . Healthy Son     SOCHx:   reports that she quit smoking about 51 years ago. She has never used smokeless tobacco. She reports that she drinks alcohol. She reports that she does not use drugs.  ALLERGIES:  Allergies  Allergen Reactions  . Advil [Ibuprofen]     Was taking this for pain and had some ? Swollen lips  . Lyrica [Pregabalin]     Extreme swelling legs    ROS: Pertinent items noted in HPI and remainder of comprehensive ROS otherwise negative.  HOME MEDS: Current Outpatient Prescriptions  Medication Sig Dispense Refill   . ACCU-CHEK FASTCLIX LANCETS MISC Use as directed 100 each 1  . ACCU-CHEK SMARTVIEW test strip USE AS INSTRUCTED TO CHECK BLOOD SUGAR TWICE A DAY 100 each 2  . acetaminophen (TYLENOL) 500 MG tablet Take 1,000 mg by mouth every 8 (eight) hours as needed.    Marland Kitchen allopurinol (ZYLOPRIM) 300 MG tablet TAKE 1 TABLET BY MOUTH ONCE DAILY 30 tablet 1  . ALPRAZolam (XANAX) 0.5 MG tablet Take 1 tablet (0.5 mg total) by mouth as needed for anxiety (for sedation before MRI scan; take 1 hour before scan; may repeat 15 min before scan). 3 tablet 0  . amLODipine (NORVASC) 5 MG tablet TAKE ONE TABLET BY MOUTH ONCE DAILY 90 tablet 1  . aspirin 81 MG tablet Take 81 mg by mouth daily.    . Blood Glucose Calibration (ACCU-CHEK AVIVA) SOLN Use as directed. 1 each 1  .  calcium-vitamin D (OSCAL 500/200 D-3) 500-200 MG-UNIT per tablet Take 1 tablet by mouth daily.    . cloNIDine (CATAPRES) 0.1 MG tablet TAKE 1 TABLET BY MOUTH TWICE A DAY 60 tablet 2  . ferrous sulfate 325 (65 FE) MG tablet Take 325 mg by mouth daily with breakfast.    . glucose blood (ACCU-CHEK SMARTVIEW) test strip Use as instructed to check blood sugar twice a day 100 each 1  . hydrALAZINE (APRESOLINE) 25 MG tablet TAKE 1 TABLET (25 MG TOTAL) BY MOUTH 2 (TWO) TIMES DAILY. 60 tablet 3  . insulin lispro protamine-lispro (HUMALOG 75/25 MIX) (75-25) 100 UNIT/ML SUSP injection Inject 11 Units into the skin 2 (two) times daily with a meal.    . Insulin Pen Needle (B-D UF III MINI PEN NEEDLES) 31G X 5 MM MISC USE TO INJECT INSULIN TWICE A DAY 100 each 4  . isosorbide dinitrate (ISORDIL) 20 MG tablet TAKE 1 TABLET (20 MG TOTAL) BY MOUTH 2 (TWO) TIMES DAILY. 60 tablet 3  . levothyroxine (SYNTHROID, LEVOTHROID) 137 MCG tablet TAKE ONE TABLET BY MOUTH ONCE DAILY BEFORE  BREAKFAST 90 tablet 1  . losartan (COZAAR) 50 MG tablet TAKE ONE TABLET BY MOUTH ONCE DAILY 90 tablet 0  . metFORMIN (GLUCOPHAGE) 1000 MG tablet TAKE 1 TABLET (1,000 MG TOTAL) BY MOUTH 2 (TWO) TIMES  DAILY WITH A MEAL. 180 tablet 1  . Sennosides-Docusate Sodium (STOOL SOFTENER & LAXATIVE PO) Take 1 tablet by mouth daily as needed (constipation).     . simvastatin (ZOCOR) 20 MG tablet TAKE 1 TABLET BY MOUTH EVERY DAY 90 tablet 1  . vitamin B-12 (CYANOCOBALAMIN) 1000 MCG tablet Take 1,000 mcg by mouth daily.     No current facility-administered medications for this visit.     LABS/IMAGING: No results found for this or any previous visit (from the past 48 hour(s)). No results found.  WEIGHTS: Wt Readings from Last 3 Encounters:  08/14/16 236 lb 8 oz (107.3 kg)  04/21/16 238 lb 11.2 oz (108.3 kg)  04/13/16 246 lb (111.6 kg)    VITALS: There were no vitals taken for this visit.  EXAM: General appearance: alert, no distress and morbidly obese Neck: no carotid bruit and no JVD Lungs: clear to auscultation bilaterally Heart: regular rate and rhythm Abdomen: soft, non-tender; bowel sounds normal; no masses,  no organomegaly and morbidly obese Extremities: edema trace sockline edema and venous stasis dermatitis noted Pulses: 2+ and symmetric Skin: Skin color, texture, turgor normal. No rashes or lesions Neurologic: Grossly normal Psych: Pleasant  EKG: Sinus bradycardia at 53, moderate voltage criteria for LVH  ASSESSMENT: 1. Presyncope/syncope - no clear relation to device findings. 2. Bradycardia 3. Progressive dyspnea and exertion 4. Morbid obesity 5. Hypertension 6. Dyslipidemia 7. Insulin-dependent diabetes  PLAN: 1.   Mrs. Eigsti has not had any further episodes of presyncope or syncope. Her device has not picked up any abnormal arrhythmias. I'm highly concerned about sleep apnea or other sleep disorder such as narcolepsy. She is scheduled for sleep study next month. Follow-up with me in 6 months or sooner as necessary.  Pixie Casino, MD, [facility] Attending Cardiologist [place] [facility] Dicky Boer 08/17/2016, 10:11 AM

## 2016-08-24 NOTE — Telephone Encounter (Signed)
Dr Jaynee EaglesJuluis Rainier  Received physician status notification on 08/16/16 via fax from [facility] that the patient cancelled appt scheduled for 08/04/16. She refused to r/s the EEG. She no longer wants to have testing done.

## 2016-09-11 LAB — CUP PACEART REMOTE DEVICE CHECK: MDC IDC SESS DTM: 20170820193603

## 2016-09-11 NOTE — Progress Notes (Signed)
Carelink summary report received. Battery status OK. Normal device function. No new symptom episodes, tachy episodes, brady, or pause episodes. No new AF episodes. 1 brady- previously addressed. 1 AF- previously addressed. Monthly summary reports and ROV/PRN

## 2016-09-12 ENCOUNTER — Ambulatory Visit (INDEPENDENT_AMBULATORY_CARE_PROVIDER_SITE_OTHER): Payer: Medicare Other | Admitting: *Deleted

## 2016-09-12 DIAGNOSIS — R55 Syncope and collapse: Secondary | ICD-10-CM

## 2016-09-13 NOTE — Progress Notes (Signed)
Carelink Summary Report / Loop Recorder 

## 2016-09-21 ENCOUNTER — Institutional Professional Consult (permissible substitution): Payer: Medicare Other | Admitting: Pulmonary Disease

## 2016-09-27 DIAGNOSIS — Z92241 Personal history of systemic steroid therapy: Secondary | ICD-10-CM

## 2016-09-27 HISTORY — DX: Personal history of systemic steroid therapy: Z92.241

## 2016-10-03 ENCOUNTER — Other Ambulatory Visit: Payer: Self-pay | Admitting: Family Medicine

## 2016-10-05 ENCOUNTER — Ambulatory Visit (AMBULATORY_SURGERY_CENTER): Payer: Self-pay | Admitting: *Deleted

## 2016-10-05 ENCOUNTER — Encounter: Payer: Self-pay | Admitting: Internal Medicine

## 2016-10-05 ENCOUNTER — Encounter: Payer: Self-pay | Admitting: Family Medicine

## 2016-10-05 ENCOUNTER — Ambulatory Visit (INDEPENDENT_AMBULATORY_CARE_PROVIDER_SITE_OTHER): Payer: Medicare Other | Admitting: Family Medicine

## 2016-10-05 VITALS — Ht 61.5 in | Wt 235.0 lb

## 2016-10-05 DIAGNOSIS — Z86010 Personal history of colonic polyps: Secondary | ICD-10-CM

## 2016-10-05 DIAGNOSIS — R059 Cough, unspecified: Secondary | ICD-10-CM

## 2016-10-05 DIAGNOSIS — R05 Cough: Secondary | ICD-10-CM

## 2016-10-05 DIAGNOSIS — J0140 Acute pansinusitis, unspecified: Secondary | ICD-10-CM

## 2016-10-05 MED ORDER — AMOXICILLIN-POT CLAVULANATE 875-125 MG PO TABS: 1.0000 | ORAL_TABLET | Freq: Two times a day (BID) | ORAL | 0 refills | Status: DC

## 2016-10-05 MED ORDER — BENZONATATE 100 MG PO CAPS: 100.0000 mg | ORAL_CAPSULE | Freq: Three times a day (TID) | ORAL | 0 refills | Status: DC

## 2016-10-05 MED ORDER — IPRATROPIUM-ALBUTEROL 0.5-2.5 (3) MG/3ML IN SOLN: 3.0000 mL | Freq: Once | RESPIRATORY_TRACT | Status: AC

## 2016-10-05 MED ORDER — NA SULFATE-K SULFATE-MG SULF 17.5-3.13-1.6 GM/177ML PO SOLN: 1.0000 | Freq: Once | ORAL | 0 refills | Status: AC

## 2016-10-05 NOTE — Progress Notes (Signed)
Pre visit review using our clinic review tool, if applicable. No additional management support is needed unless otherwise documented below in the visit note. 

## 2016-10-05 NOTE — Progress Notes (Signed)
No egg or soy allergy known to patient  No issues with past sedation with any surgeries  or procedures, no intubation problems  No diet pills per patient No home 02 use per patient  No blood thinners per patient  Pt denies issues with constipation  No A fib or A flutter  Loop recorder place 01-2016

## 2016-10-05 NOTE — Patient Instructions (Signed)
Please take medication as directed and follow up with Dr. Maudie Mercury for evaluation of Blood pressure in one to two weeks.   Sinusitis, Adult Sinusitis is redness, soreness, and inflammation of the paranasal sinuses. Paranasal sinuses are air pockets within the bones of your face. They are located beneath your eyes, in the middle of your forehead, and above your eyes. In healthy paranasal sinuses, mucus is able to drain out, and air is able to circulate through them by way of your nose. However, when your paranasal sinuses are inflamed, mucus and air can become trapped. This can allow bacteria and other germs to grow and cause infection. Sinusitis can develop quickly and last only a short time (acute) or continue over a long period (chronic). Sinusitis that lasts for more than 12 weeks is considered chronic. CAUSES Causes of sinusitis include:  Allergies.  Structural abnormalities, such as displacement of the cartilage that separates your nostrils (deviated septum), which can decrease the air flow through your nose and sinuses and affect sinus drainage.  Functional abnormalities, such as when the small hairs (cilia) that line your sinuses and help remove mucus do not work properly or are not present. SIGNS AND SYMPTOMS Symptoms of acute and chronic sinusitis are the same. The primary symptoms are pain and pressure around the affected sinuses. Other symptoms include:  Upper toothache.  Earache.  Headache.  Bad breath.  Decreased sense of smell and taste.  A cough, which worsens when you are lying flat.  Fatigue.  Fever.  Thick drainage from your nose, which often is green and may contain pus (purulent).  Swelling and warmth over the affected sinuses. DIAGNOSIS Your health care provider will perform a physical exam. During your exam, your health care provider may perform any of the following to help determine if you have acute sinusitis or chronic sinusitis:  Look in your nose for signs  of abnormal growths in your nostrils (nasal polyps).  Tap over the affected sinus to check for signs of infection.  View the inside of your sinuses using an imaging device that has a light attached (endoscope). If your health care provider suspects that you have chronic sinusitis, one or more of the following tests may be recommended:  Allergy tests.  Nasal culture. A sample of mucus is taken from your nose, sent to a lab, and screened for bacteria.  Nasal cytology. A sample of mucus is taken from your nose and examined by your health care provider to determine if your sinusitis is related to an allergy. TREATMENT Most cases of acute sinusitis are related to a viral infection and will resolve on their own within 10 days. Sometimes, medicines are prescribed to help relieve symptoms of both acute and chronic sinusitis. These may include pain medicines, decongestants, nasal steroid sprays, or saline sprays. However, for sinusitis related to a bacterial infection, your health care provider will prescribe antibiotic medicines. These are medicines that will help kill the bacteria causing the infection. Rarely, sinusitis is caused by a fungal infection. In these cases, your health care provider will prescribe antifungal medicine. For some cases of chronic sinusitis, surgery is needed. Generally, these are cases in which sinusitis recurs more than 3 times per year, despite other treatments. HOME CARE INSTRUCTIONS  Drink plenty of water. Water helps thin the mucus so your sinuses can drain more easily.  Use a humidifier.  Inhale steam 3-4 times a day (for example, sit in the bathroom with the shower running).  Apply a warm, moist  washcloth to your face 3-4 times a day, or as directed by your health care provider.  Use saline nasal sprays to help moisten and clean your sinuses.  Take medicines only as directed by your health care provider.  If you were prescribed either an antibiotic or  antifungal medicine, finish it all even if you start to feel better. SEEK IMMEDIATE MEDICAL CARE IF:  You have increasing pain or severe headaches.  You have nausea, vomiting, or drowsiness.  You have swelling around your face.  You have vision problems.  You have a stiff neck.  You have difficulty breathing.   This information is not intended to replace advice given to you by your health care provider. Make sure you discuss any questions you have with your health care provider.   Document Released: 12/11/2005 Document Revised: 01/01/2015 Document Reviewed: 12/26/2011 Elsevier Interactive Patient Education [employer].

## 2016-10-05 NOTE — Progress Notes (Addendum)
Subjective:    Patient ID: Katie Mcintyre, female    DOB: 02-19-1941, 75 y.o.   MRN: [id number]  HPI  Katie Mcintyre is a 75 year old female who presents today with ear fullness and sore throat that have been present for greater than one week. Patient notes symptoms started 8 to 9 days ago. Associated symptoms of rhinitis, congestion, cough that is productive of clear sputum, sinus pressure/pain, myalgias, and wheezing.  Denies fever, chills, sweats, chest pain, palpitations, SOB, numbness, tingling, or weakness. Treatment at home includes OTC cough/cold medication with a decongestant. She is not a smoker. No history of asthma/bronchitis. No recent antibiotic use. Recent sick contact exposure with grandchild who is 64 years old. She reports recent steroid therapy for back pain where she is being treated at [facility].  Review of Systems  Constitutional: Negative for chills, fatigue and fever.  HENT: Positive for congestion, ear pain, postnasal drip, rhinorrhea, sinus pressure and sore throat.   Eyes: Negative for visual disturbance.  Respiratory: Positive for cough and wheezing. Negative for shortness of breath.   Cardiovascular: Negative for chest pain and palpitations.  Gastrointestinal: Negative for abdominal pain, constipation, diarrhea, nausea and vomiting.  Musculoskeletal: Positive for myalgias.  Skin: Negative for rash.  Neurological: Negative for dizziness, weakness, light-headedness, numbness and headaches.   Past Medical History:  Diagnosis Date  . Anemia   . Arthritis   . Cancer ([place])    cervical  . Cataract    removed both eyes with lens implant both   . Chicken pox   . Chronic bronchitis ([place])   . Colon polyp   . Depression 06/30/2013  . Diabetes mellitus without complication ([place])   . Diverticulitis   . Frequent headaches   . GERD (gastroesophageal reflux disease)   . Gout 06/30/2013  . Hyperlipidemia   . Hypertension   . S/P epidural steroid injection 09/27/2016   done  in back at [place]   . Spinal stenosis of lumbar region    s/p spine surgery 02/12/13 with Dr Marcos Eke at [facility]  . Thyroid disease   . Urine incontinence   . Use of cane as ambulatory aid      Social History   Social History  . Marital status: Divorced    Spouse name: N/A  . Number of children: 5  . Years of education: 20   Occupational History  . Retired    Social History Main Topics  . Smoking status: Former Smoker    Quit date: 12/25/1964  . Smokeless tobacco: Never Used  . Alcohol use Yes     Comment: occ with steak dinner   . Drug use: No  . Sexual activity: Not on file   Other Topics Concern  . Not on file   Social History Narrative   Work or School: retired Nurse, children's Situation: lives with son in [place]: exercising on regular visit; trying to work on diet            Epworth Sleepiness Scale = 20 (as of 02/02/2016)             Past Surgical History:  Procedure Laterality Date  . ABDOMINAL HYSTERECTOMY  1970   complete hysterectomy for cervical cancer  . BACK SURGERY  01/2013  . BACK SURGERY  1995  . CATARACT EXTRACTION, BILATERAL     with lens implant   .  CERVICAL CONE BIOPSY    . COLONOSCOPY    . EP IMPLANTABLE DEVICE N/A 02/15/2016   Procedure: Loop Recorder Insertion;  Surgeon: Sanda Klein, MD;  Location: [place] [facility];  Service: Cardiovascular;  Laterality: N/A;  . POLYPECTOMY    . WRIST SURGERY  2010   for wrist fracture    Family History  Problem Relation Age of Onset  . Alcohol abuse Father   . Cirrhosis Father   . Heart attack Brother   . Breast cancer Sister   . Cancer Mother     renal cancer  . Stroke Paternal Grandmother   . Alcohol abuse Brother   . Cancer Sister     renal cancer  . Diabetes Brother   . Heart Problems Daughter   . Healthy Daughter   . Healthy Daughter   . Healthy Son   . Healthy Son   . Arthritis      parents  . Hyperlipidemia       parent  . Hypertension      parent/grandparent  . Diabetes      parent/grandparent  . Heart disease Other   . Mental illness Other   . Colon cancer Neg Hx   . Esophageal cancer Neg Hx   . Prostate cancer Neg Hx   . Rectal cancer Neg Hx   . Seizures Neg Hx   . Colon polyps Neg Hx     Allergies  Allergen Reactions  . Advil [Ibuprofen]     Was taking this for pain and had some ? Swollen lips  . Lyrica [Pregabalin]     Extreme swelling legs    Current Outpatient Prescriptions on File Prior to Visit  Medication Sig Dispense Refill  . ACCU-CHEK FASTCLIX LANCETS MISC Use as directed 100 each 1  . ACCU-CHEK SMARTVIEW test strip USE AS INSTRUCTED TO CHECK BLOOD SUGAR TWICE A DAY 100 each 3  . acetaminophen (TYLENOL) 500 MG tablet Take 1,000 mg by mouth every 8 (eight) hours as needed.    Marland Kitchen allopurinol (ZYLOPRIM) 300 MG tablet TAKE 1 TABLET BY MOUTH ONCE DAILY 30 tablet 1  . amLODipine (NORVASC) 5 MG tablet TAKE ONE TABLET BY MOUTH ONCE DAILY 90 tablet 1  . aspirin 81 MG tablet Take 81 mg by mouth daily.    . Blood Glucose Calibration (ACCU-CHEK AVIVA) SOLN Use as directed. 1 each 1  . calcium-vitamin D (OSCAL 500/200 D-3) 500-200 MG-UNIT per tablet Take 1 tablet by mouth daily.    . cloNIDine (CATAPRES) 0.1 MG tablet TAKE 1 TABLET BY MOUTH TWICE A DAY 60 tablet 2  . ferrous sulfate 325 (65 FE) MG tablet Take 325 mg by mouth daily with breakfast.    . glucose blood (ACCU-CHEK SMARTVIEW) test strip Use as instructed to check blood sugar twice a day 100 each 1  . hydrALAZINE (APRESOLINE) 25 MG tablet TAKE 1 TABLET (25 MG TOTAL) BY MOUTH 2 (TWO) TIMES DAILY. 60 tablet 3  . insulin lispro protamine-lispro (HUMALOG 75/25 MIX) (75-25) 100 UNIT/ML SUSP injection Inject 11 Units into the skin 2 (two) times daily with a meal.    . Insulin Pen Needle (B-D UF III MINI PEN NEEDLES) 31G X 5 MM MISC USE TO INJECT INSULIN TWICE A DAY 100 each 4  . isosorbide dinitrate (ISORDIL) 20 MG tablet TAKE 1  TABLET (20 MG TOTAL) BY MOUTH 2 (TWO) TIMES DAILY. 60 tablet 3  . levothyroxine (SYNTHROID, LEVOTHROID) 137 MCG tablet TAKE ONE TABLET BY MOUTH ONCE  DAILY BEFORE  BREAKFAST 90 tablet 1  . losartan (COZAAR) 50 MG tablet TAKE ONE TABLET BY MOUTH ONCE DAILY 90 tablet 0  . metFORMIN (GLUCOPHAGE) 1000 MG tablet TAKE 1 TABLET (1,000 MG TOTAL) BY MOUTH 2 (TWO) TIMES DAILY WITH A MEAL. 180 tablet 1  . Na Sulfate-K Sulfate-Mg Sulf (SUPREP BOWEL PREP KIT) 17.5-3.13-1.6 GM/180ML SOLN Take 1 kit by mouth once. suprep as directed. No substitutions 354 mL 0  . Sennosides-Docusate Sodium (STOOL SOFTENER & LAXATIVE PO) Take 1 tablet by mouth daily as needed (constipation).     . simvastatin (ZOCOR) 20 MG tablet TAKE 1 TABLET BY MOUTH EVERY DAY 90 tablet 1  . vitamin B-12 (CYANOCOBALAMIN) 1000 MCG tablet Take 1,000 mcg by mouth daily.     No current facility-administered medications on file prior to visit.     BP (!) 160/80   Pulse 94   Temp 98.8 F (37.1 C) (Oral)   Wt 235 lb 6.4 oz (106.8 kg)   SpO2 95%   BMI 43.76 kg/m       Objective:   Physical Exam  Constitutional: She is oriented to person, place, and time. She appears well-developed and well-nourished.  HENT:  Right Ear: Tympanic membrane normal.  Left Ear: Tympanic membrane is injected.  Nose: Rhinorrhea present. Right sinus exhibits maxillary sinus tenderness and frontal sinus tenderness. Left sinus exhibits maxillary sinus tenderness and frontal sinus tenderness.  Mouth/Throat: Mucous membranes are normal. No oropharyngeal exudate.  Minimal oropharyngeal erythema present  Eyes: Pupils are equal, round, and reactive to light. No scleral icterus.  Neck: Neck supple.  Cardiovascular: Normal rate and regular rhythm.   Pulmonary/Chest: Effort normal. She has wheezes. She has no rales.  Abdominal: Soft. Bowel sounds are normal. There is no tenderness.  Lymphadenopathy:    She has no cervical adenopathy.  Neurological: She is alert and  oriented to person, place, and time.  Skin: Skin is warm and dry. No rash noted.       Assessment & Plan:  1. Acute pansinusitis, recurrence not specified Symptoms greater than one week with acutely ill presentation support treatment with an antibiotic. Will hold off on prednisone therapy at this time due steroid treatment 8 days ago for back pain. If symptoms do not improve, worsen, or she develops a fever >100, advised follow up.  - amoxicillin-clavulanate (AUGMENTIN) 875-125 MG tablet; Take 1 tablet by mouth 2 (two) times daily.  Dispense: 20 tablet; Refill: 0  2. Cough Lungs CTA after administration of Duoneb. O2 sat 95% and RR 14 Mucinex DM for cough or benzonatate for cough that is not controlled with Mucinex DM. Will consider prednisone if symptom of cough does not improve.  Close follow up is recommended within one week for blood pressure evaluation. Retake of BP was 160/80. Suspect that her elevated BP is related to OTC decongestants. Advised her to stop OTC decongestants and discussed with her that these will raise BP.    - benzonatate (TESSALON) 100 MG capsule; Take 1 capsule (100 mg total) by mouth 3 (three) times daily.  Dispense: 20 capsule; Refill: 0 - ipratropium-albuterol (DUONEB) 0.5-2.5 (3) MG/3ML nebulizer solution 3 mL; Take 3 mLs by nebulization once.  Delano Metz, FNP-C

## 2016-10-07 LAB — CUP PACEART REMOTE DEVICE CHECK: Date Time Interrogation Session: 20170919193820

## 2016-10-07 NOTE — Progress Notes (Signed)
Carelink summary report received. Battery status OK. Normal device function. No new symptom episodes, tachy episodes, brady, or pause episodes. No new AF episodes. Monthly summary reports and ROV/PRN 

## 2016-10-12 ENCOUNTER — Ambulatory Visit (INDEPENDENT_AMBULATORY_CARE_PROVIDER_SITE_OTHER): Payer: Medicare Other | Admitting: *Deleted

## 2016-10-12 ENCOUNTER — Other Ambulatory Visit: Payer: Self-pay | Admitting: *Deleted

## 2016-10-12 DIAGNOSIS — R55 Syncope and collapse: Secondary | ICD-10-CM | POA: Diagnosis not present

## 2016-10-12 MED ORDER — ISOSORBIDE DINITRATE 20 MG PO TABS: ORAL_TABLET | ORAL | 5 refills | Status: DC

## 2016-10-12 MED ORDER — CLONIDINE HCL 0.1 MG PO TABS: 0.1000 mg | ORAL_TABLET | Freq: Two times a day (BID) | ORAL | 5 refills | Status: DC

## 2016-10-12 MED ORDER — HYDRALAZINE HCL 25 MG PO TABS: ORAL_TABLET | ORAL | 5 refills | Status: DC

## 2016-10-12 NOTE — Telephone Encounter (Signed)
Rx done. 
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
## 2016-10-13 ENCOUNTER — Ambulatory Visit: Payer: Medicare Other

## 2016-10-13 NOTE — Progress Notes (Signed)
Carelink Summary Report / Loop Recorder 

## 2016-10-14 ENCOUNTER — Other Ambulatory Visit: Payer: Self-pay | Admitting: Family Medicine

## 2016-10-17 ENCOUNTER — Telehealth: Payer: Self-pay | Admitting: Family Medicine

## 2016-10-17 ENCOUNTER — Other Ambulatory Visit: Payer: Self-pay | Admitting: *Deleted

## 2016-10-17 MED ORDER — GLUCOSE BLOOD VI STRP: ORAL_STRIP | 5 refills | Status: DC

## 2016-10-17 MED ORDER — CLONIDINE HCL 0.1 MG PO TABS: 0.1000 mg | ORAL_TABLET | Freq: Two times a day (BID) | ORAL | 1 refills | Status: DC

## 2016-10-17 MED ORDER — HYDRALAZINE HCL 25 MG PO TABS: ORAL_TABLET | ORAL | 1 refills | Status: DC

## 2016-10-17 MED ORDER — ACCU-CHEK SOFTCLIX LANCET DEV MISC: 5 refills | Status: AC

## 2016-10-17 NOTE — Addendum Note (Signed)
Addended by: Agnes Lawrence on: 10/17/2016 03:46 PM   Modules accepted: Orders

## 2016-10-17 NOTE — Telephone Encounter (Signed)
I called the pt and informed her an [place] was left at the front desk for her to pick up and test strips and lancets were sent to her pharmacy.

## 2016-10-17 NOTE — Telephone Encounter (Signed)
Pt would like to see if you have any meters in the office that she can get or if you can please call her one in.  Pt current meter is [employer] it stated that the batteries need changing and she changed them and not it has been two week since she changed the batteries and now it is not working again.  Pharm:  [facility] on [address]

## 2016-10-17 NOTE — Telephone Encounter (Signed)
Rx done. 

## 2016-10-19 ENCOUNTER — Encounter: Payer: Medicare Other | Admitting: Internal Medicine

## 2016-10-25 ENCOUNTER — Other Ambulatory Visit: Payer: Self-pay | Admitting: Family Medicine

## 2016-10-27 ENCOUNTER — Institutional Professional Consult (permissible substitution): Payer: Medicare Other | Admitting: Pulmonary Disease

## 2016-11-02 ENCOUNTER — Encounter: Payer: Medicare Other | Admitting: Internal Medicine
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
## 2016-11-07 ENCOUNTER — Telehealth: Payer: Self-pay

## 2016-11-07 ENCOUNTER — Ambulatory Visit (INDEPENDENT_AMBULATORY_CARE_PROVIDER_SITE_OTHER): Payer: Medicare Other

## 2016-11-07 DIAGNOSIS — Z0000 Encounter for general adult medical examination without abnormal findings: Secondary | ICD-10-CM

## 2016-11-07 DIAGNOSIS — E1159 Type 2 diabetes mellitus with other circulatory complications: Secondary | ICD-10-CM

## 2016-11-07 DIAGNOSIS — Z23 Encounter for immunization: Secondary | ICD-10-CM

## 2016-11-07 DIAGNOSIS — Z794 Long term (current) use of insulin: Secondary | ICD-10-CM

## 2016-11-07 NOTE — Patient Instructions (Addendum)
Katie Mcintyre , Thank you for taking time to come for your Medicare Wellness Visit. I appreciate your ongoing commitment to your health goals. Please review the following plan we discussed and let me know if I can assist you in the future.   These are the goals we discussed: Goals    . Exercise 150 minutes per week (moderate activity)          [facility]  Horizontal bike        This is a list of the screening recommended for you and due dates:  Health Maintenance  Topic Date Due  . Eye exam for diabetics  09/11/2015  . Flu Shot  07/25/2016  . Colon Cancer Screening  09/11/2016  . DEXA scan (bone density measurement)  02/28/2025*  . Shingles Vaccine  02/28/2025*  . Hemoglobin A1C  02/14/2017  . Complete foot exam   03/21/2017  . Tetanus Vaccine  07/01/2023  *Topic was postponed. The date shown is not the original due date.   Diabetes and weight loss; [facility]  Phone: [phone]    [facility]; [phone] [address] [place]; [phone] Get resource to get information on any and all community programs for Seniors  [place]: [phone] Community Health Response Program -[phone] Public Health Dept; Need to be a skilled visit but can assist with bathing as well; [phone]  Adult center for Enrichment;  Call Senior Line; [phone]  Adult day services include Adult Day Care, [place], [employer], Education and Support Program  Reviewed for annual vision exam;   The [place] assistance for eyewear is coordinated through [facility]; Please call Cherlyn Labella at [phone] To help with glasses    Prevention of falls: Remove rugs or any tripping hazards in the home Use Non slip mats in bathtubs and showers Placing grab bars next to the toilet and or shower Placing handrails on both sides of the stair way Adding extra  lighting in the home.   Personal safety issues reviewed:  1. Consider starting a community watch program per [facility] 2.  Changes batteries is smoke detector and/or carbon monoxide detector  3.  If you have firearms; keep them in a safe place 4.  Wear protection when in the sun; Always wear sunscreen or a hat; It is good to have your doctor check your skin annually or review any new areas of concern 5. Driving safety; Keep in the right lane; stay 3 car lengths behind the car in front of you on the highway; look 3 times prior to pulling out; carry your cell phone everywhere you go!   Learn about the Yellow Dot program:  The program allows first responders at your emergency to have access to who your physician is, as well as your medications and medical conditions.  Citizens requesting the Yellow Dot Packages should contact Master Corporal Nunzio Cobbs at the [facility] [phone] for the first week of the program and beginning the week after Easter citizens should contact their Scientist, physiological.      Diabetes and Foot Care Diabetes may cause you to have problems because of poor blood supply (circulation) to your feet and legs. This may cause the skin on your feet to become thinner, break easier, and heal more slowly. Your skin may become dry, and the skin may peel and crack. You may also  have nerve damage in your legs and feet causing decreased feeling in them. You may not notice minor injuries to your feet that could lead to infections or more serious problems. Taking care of your feet is one of the most important things you can do for yourself. Follow these instructions at home:  Wear shoes at all times, even in the house. Do not go barefoot. Bare feet are easily injured.  Check your feet daily for blisters, cuts, and redness. If you cannot see the bottom of your feet, use a mirror or ask someone for help.  Wash your feet with warm water  (do not use hot water) and mild soap. Then pat your feet and the areas between your toes until they are completely dry. Do not soak your feet as this can dry your skin.  Apply a moisturizing lotion or petroleum jelly (that does not contain alcohol and is unscented) to the skin on your feet and to dry, brittle toenails. Do not apply lotion between your toes.  Trim your toenails straight across. Do not dig under them or around the cuticle. File the edges of your nails with an emery board or nail file.  Do not cut corns or calluses or try to remove them with medicine.  Wear clean socks or stockings every day. Make sure they are not too tight. Do not wear knee-high stockings since they may decrease blood flow to your legs.  Wear shoes that fit properly and have enough cushioning. To break in new shoes, wear them for just a few hours a day. This prevents you from injuring your feet. Always look in your shoes before you put them on to be sure there are no objects inside.  Do not cross your legs. This may decrease the blood flow to your feet.  If you find a minor scrape, cut, or break in the skin on your feet, keep it and the skin around it clean and dry. These areas may be cleansed with mild soap and water. Do not cleanse the area with peroxide, alcohol, or iodine.  When you remove an adhesive bandage, be sure not to damage the skin around it.  If you have a wound, look at it several times a day to make sure it is healing.  Do not use heating pads or hot water bottles. They may burn your skin. If you have lost feeling in your feet or legs, you may not know it is happening until it is too late.  Make sure your health care provider performs a complete foot exam at least annually or more often if you have foot problems. Report any cuts, sores, or bruises to your health care provider immediately. Contact a health care provider if:  You have an injury that is not healing.  You have cuts or breaks in  the skin.  You have an ingrown nail.  You notice redness on your legs or feet.  You feel burning or tingling in your legs or feet.  You have pain or cramps in your legs and feet.  Your legs or feet are numb.  Your feet always feel cold. Get help right away if:  There is increasing redness, swelling, or pain in or around a wound.  There is a red line that goes up your leg.  Pus is coming from a wound.  You develop a fever or as directed by your health care provider.  You notice a bad smell coming from an ulcer or wound. This  information is not intended to replace advice given to you by your health care provider. Make sure you discuss any questions you have with your health care provider. Document Released: 12/08/2000 Document Revised: 05/18/2016 Document Reviewed: 05/20/2013 Elsevier Interactive Patient Education  2017 [place] Prevention in the Home Introduction Falls can cause injuries. They can happen to people of all ages. There are many things you can do to make your home safe and to help prevent falls. What can I do on the outside of my home?  Regularly fix the edges of walkways and driveways and fix any cracks.  Remove anything that might make you trip as you walk through a door, such as a raised step or threshold.  Trim any bushes or trees on the path to your home.  Use bright outdoor lighting.  Clear any walking paths of anything that might make someone trip, such as rocks or tools.  Regularly check to see if handrails are loose or broken. Make sure that both sides of any steps have handrails.  Any raised decks and porches should have guardrails on the edges.  Have any leaves, snow, or ice cleared regularly.  Use sand or salt on walking paths during winter.  Clean up any spills in your garage right away. This includes oil or grease spills. What can I do in the bathroom?  Use night lights.  Install grab bars by the toilet and in the tub and  shower. Do not use towel bars as grab bars.  Use non-skid mats or decals in the tub or shower.  If you need to sit down in the shower, use a plastic, non-slip stool.  Keep the floor dry. Clean up any water that spills on the floor as soon as it happens.  Remove soap buildup in the tub or shower regularly.  Attach bath mats securely with double-sided non-slip rug tape.  Do not have throw rugs and other things on the floor that can make you trip. What can I do in the bedroom?  Use night lights.  Make sure that you have a light by your bed that is easy to reach.  Do not use any sheets or blankets that are too big for your bed. They should not hang down onto the floor.  Have a firm chair that has side arms. You can use this for support while you get dressed.  Do not have throw rugs and other things on the floor that can make you trip. What can I do in the kitchen?  Clean up any spills right away.  Avoid walking on wet floors.  Keep items that you use a lot in easy-to-reach places.  If you need to reach something above you, use a strong step stool that has a grab bar.  Keep electrical cords out of the way.  Do not use floor polish or wax that makes floors slippery. If you must use wax, use non-skid floor wax.  Do not have throw rugs and other things on the floor that can make you trip. What can I do with my stairs?  Do not leave any items on the stairs.  Make sure that there are handrails on both sides of the stairs and use them. Fix handrails that are broken or loose. Make sure that handrails are as long as the stairways.  Check any carpeting to make sure that it is firmly attached to the stairs. Fix any carpet that is loose or worn.  Avoid having  throw rugs at the top or bottom of the stairs. If you do have throw rugs, attach them to the floor with carpet tape.  Make sure that you have a light switch at the top of the stairs and the bottom of the stairs. If you do not  have them, ask someone to add them for you. What else can I do to help prevent falls?  Wear shoes that:  Do not have high heels.  Have rubber bottoms.  Are comfortable and fit you well.  Are closed at the toe. Do not wear sandals.  If you use a stepladder:  Make sure that it is fully opened. Do not climb a closed stepladder.  Make sure that both sides of the stepladder are locked into place.  Ask someone to hold it for you, if possible.  Clearly mark and make sure that you can see:  Any grab bars or handrails.  First and last steps.  Where the edge of each step is.  Use tools that help you move around (mobility aids) if they are needed. These include:  Canes.  Walkers.  Scooters.  Crutches.  Turn on the lights when you go into a dark area. Replace any light bulbs as soon as they burn out.  Set up your furniture so you have a clear path. Avoid moving your furniture around.  If any of your floors are uneven, fix them.  If there are any pets around you, be aware of where they are.  Review your medicines with your doctor. Some medicines can make you feel dizzy. This can increase your chance of falling. Ask your doctor what other things that you can do to help prevent falls. This information is not intended to replace advice given to you by your health care provider. Make sure you discuss any questions you have with your health care provider. Document Released: 10/07/2009 Document Revised: 05/18/2016 Document Reviewed: 01/15/2015  2017 Elsevier  Health Maintenance, Female Introduction Adopting a healthy lifestyle and getting preventive care can go a long way to promote health and wellness. Talk with your health care provider about what schedule of regular examinations is right for you. This is a good chance for you to check in with your provider about disease prevention and staying healthy. In between checkups, there are plenty of things you can do on your own.  Experts have done a lot of research about which lifestyle changes and preventive measures are most likely to keep you healthy. Ask your health care provider for more information. Weight and diet Eat a healthy diet  Be sure to include plenty of vegetables, fruits, low-fat dairy products, and lean protein.  Do not eat a lot of foods high in solid fats, added sugars, or salt.  Get regular exercise. This is one of the most important things you can do for your health.  Most adults should exercise for at least 150 minutes each week. The exercise should increase your heart rate and make you sweat (moderate-intensity exercise).  Most adults should also do strengthening exercises at least twice a week. This is in addition to the moderate-intensity exercise. Maintain a healthy weight  Body mass index (BMI) is a measurement that can be used to identify possible weight problems. It estimates body fat based on height and weight. Your health care provider can help determine your BMI and help you achieve or maintain a healthy weight.  For females 7 years of age and older:  A BMI below 18.5 is  considered underweight.  A BMI of 18.5 to 24.9 is normal.  A BMI of 25 to 29.9 is considered overweight.  A BMI of 30 and above is considered obese. Watch levels of cholesterol and blood lipids  You should start having your blood tested for lipids and cholesterol at 76 years of age, then have this test every 5 years.  You may need to have your cholesterol levels checked more often if:  Your lipid or cholesterol levels are high.  You are older than 76 years of age.  You are at high risk for heart disease. Cancer screening Lung Cancer  Lung cancer screening is recommended for adults 75-6 years old who are at high risk for lung cancer because of a history of smoking.  A yearly low-dose CT scan of the lungs is recommended for people who:  Currently smoke.  Have quit within the past 15 years.  Have  at least a 30-pack-year history of smoking. A pack year is smoking an average of one pack of cigarettes a day for 1 year.  Yearly screening should continue until it has been 15 years since you quit.  Yearly screening should stop if you develop a health problem that would prevent you from having lung cancer treatment. Breast Cancer  Practice breast self-awareness. This means understanding how your breasts normally appear and feel.  It also means doing regular breast self-exams. Let your health care provider know about any changes, no matter how small.  If you are in your 20s or 30s, you should have a clinical breast exam (CBE) by a health care provider every 1-3 years as part of a regular health exam.  If you are 55 or older, have a CBE every year. Also consider having a breast X-ray (mammogram) every year.  If you have a family history of breast cancer, talk to your health care provider about genetic screening.  If you are at high risk for breast cancer, talk to your health care provider about having an MRI and a mammogram every year.  Breast cancer gene (BRCA) assessment is recommended for women who have family members with BRCA-related cancers. BRCA-related cancers include:  Breast.  Ovarian.  Tubal.  Peritoneal cancers.  Results of the assessment will determine the need for genetic counseling and BRCA1 and BRCA2 testing. Cervical Cancer  Your health care provider may recommend that you be screened regularly for cancer of the pelvic organs (ovaries, uterus, and vagina). This screening involves a pelvic examination, including checking for microscopic changes to the surface of your cervix (Pap test). You may be encouraged to have this screening done every 3 years, beginning at age 51.  For women ages 7-65, health care providers may recommend pelvic exams and Pap testing every 3 years, or they may recommend the Pap and pelvic exam, combined with testing for human papilloma virus  (HPV), every 5 years. Some types of HPV increase your risk of cervical cancer. Testing for HPV may also be done on women of any age with unclear Pap test results.  Other health care providers may not recommend any screening for nonpregnant women who are considered low risk for pelvic cancer and who do not have symptoms. Ask your health care provider if a screening pelvic exam is right for you.  If you have had past treatment for cervical cancer or a condition that could lead to cancer, you need Pap tests and screening for cancer for at least 20 years after your treatment. If Pap tests have  been discontinued, your risk factors (such as having a new sexual partner) need to be reassessed to determine if screening should resume. Some women have medical problems that increase the chance of getting cervical cancer. In these cases, your health care provider may recommend more frequent screening and Pap tests. Colorectal Cancer  This type of cancer can be detected and often prevented.  Routine colorectal cancer screening usually begins at 76 years of age and continues through 76 years of age.  Your health care provider may recommend screening at an earlier age if you have risk factors for colon cancer.  Your health care provider may also recommend using home test kits to check for hidden blood in the stool.  A small camera at the end of a tube can be used to examine your colon directly (sigmoidoscopy or colonoscopy). This is done to check for the earliest forms of colorectal cancer.  Routine screening usually begins at age 53.  Direct examination of the colon should be repeated every 5-10 years through 76 years of age. However, you may need to be screened more often if early forms of precancerous polyps or small growths are found. Skin Cancer  Check your skin from head to toe regularly.  Tell your health care provider about any new moles or changes in moles, especially if there is a change in a  mole's shape or color.  Also tell your health care provider if you have a mole that is larger than the size of a pencil eraser.  Always use sunscreen. Apply sunscreen liberally and repeatedly throughout the day.  Protect yourself by wearing long sleeves, pants, a wide-brimmed hat, and sunglasses whenever you are outside. Heart disease, diabetes, and high blood pressure  High blood pressure causes heart disease and increases the risk of stroke. High blood pressure is more likely to develop in:  People who have blood pressure in the high end of the normal range (130-139/85-89 mm Hg).  People who are overweight or obese.  People who are African American.  If you are 50-59 years of age, have your blood pressure checked every 3-5 years. If you are 10 years of age or older, have your blood pressure checked every year. You should have your blood pressure measured twice-once when you are at a hospital or clinic, and once when you are not at a hospital or clinic. Record the average of the two measurements. To check your blood pressure when you are not at a hospital or clinic, you can use:  An automated blood pressure machine at a pharmacy.  A home blood pressure monitor.  If you are between 37 years and 76 years old, ask your health care provider if you should take aspirin to prevent strokes.  Have regular diabetes screenings. This involves taking a blood sample to check your fasting blood sugar level.  If you are at a normal weight and have a low risk for diabetes, have this test once every three years after 76 years of age.  If you are overweight and have a high risk for diabetes, consider being tested at a younger age or more often. Preventing infection Hepatitis B  If you have a higher risk for hepatitis B, you should be screened for this virus. You are considered at high risk for hepatitis B if:  You were born in a country where hepatitis B is common. Ask your health care provider  which countries are considered high risk.  Your parents were born in a high-risk  country, and you have not been immunized against hepatitis B (hepatitis B vaccine).  You have HIV or AIDS.  You use needles to inject street drugs.  You live with someone who has hepatitis B.  You have had sex with someone who has hepatitis B.  You get hemodialysis treatment.  You take certain medicines for conditions, including cancer, organ transplantation, and autoimmune conditions. Hepatitis C  Blood testing is recommended for:  Everyone born from [birth year] through 1966.  Anyone with known risk factors for hepatitis C. Sexually transmitted infections (STIs)  You should be screened for sexually transmitted infections (STIs) including gonorrhea and chlamydia if:  You are sexually active and are younger than 76 years of age.  You are older than 76 years of age and your health care provider tells you that you are at risk for this type of infection.  Your sexual activity has changed since you were last screened and you are at an increased risk for chlamydia or gonorrhea. Ask your health care provider if you are at risk.  If you do not have HIV, but are at risk, it may be recommended that you take a prescription medicine daily to prevent HIV infection. This is called pre-exposure prophylaxis (PrEP). You are considered at risk if:  You are sexually active and do not regularly use condoms or know the HIV status of your partner(s).  You take drugs by injection.  You are sexually active with a partner who has HIV. Talk with your health care provider about whether you are at high risk of being infected with HIV. If you choose to begin PrEP, you should first be tested for HIV. You should then be tested every 3 months for as long as you are taking PrEP. Pregnancy  If you are premenopausal and you may become pregnant, ask your health care provider about preconception counseling.  If you may become pregnant,  take 400 to 800 micrograms (mcg) of folic acid every day.  If you want to prevent pregnancy, talk to your health care provider about birth control (contraception). Osteoporosis and menopause  Osteoporosis is a disease in which the bones lose minerals and strength with aging. This can result in serious bone fractures. Your risk for osteoporosis can be identified using a bone density scan.  If you are 37 years of age or older, or if you are at risk for osteoporosis and fractures, ask your health care provider if you should be screened.  Ask your health care provider whether you should take a calcium or vitamin D supplement to lower your risk for osteoporosis.  Menopause may have certain physical symptoms and risks.  Hormone replacement therapy may reduce some of these symptoms and risks. Talk to your health care provider about whether hormone replacement therapy is right for you. Follow these instructions at home:  Schedule regular health, dental, and eye exams.  Stay current with your immunizations.  Do not use any tobacco products including cigarettes, chewing tobacco, or electronic cigarettes.  If you are pregnant, do not drink alcohol.  If you are breastfeeding, limit how much and how often you drink alcohol.  Limit alcohol intake to no more than 1 drink per day for nonpregnant women. One drink equals 12 ounces of beer, 5 ounces of wine, or 1 ounces of hard liquor.  Do not use street drugs.  Do not share needles.  Ask your health care provider for help if you need support or information about quitting drugs.  Tell your health  care provider if you often feel depressed.  Tell your health care provider if you have ever been abused or do not feel safe at home. This information is not intended to replace advice given to you by your health care provider. Make sure you discuss any questions you have with your health care provider. Document Released: 06/26/2011 Document Revised:  05/18/2016 Document Reviewed: 09/14/2015  2017 Elsevier

## 2016-11-07 NOTE — Telephone Encounter (Signed)
IN for AWV Just a fup note regarding request for podiatry. Dtr cuts toenails and feels she needs someone else to monitor her feet; no c/o otherwise  See Plan at the end of note for further info

## 2016-11-11 LAB — CUP PACEART REMOTE DEVICE CHECK
MDC IDC PG IMPLANT DT: 20170221
MDC IDC SESS DTM: 20171019203849

## 2016-11-11 NOTE — Progress Notes (Signed)
Carelink summary report received. Battery status OK. Normal device function. No new symptom episodes, tachy episodes, brady, or pause episodes. No new AF episodes. Monthly summary reports and ROV/PRN 

## 2016-11-13 ENCOUNTER — Other Ambulatory Visit: Payer: Self-pay | Admitting: Family Medicine

## 2016-11-13 ENCOUNTER — Ambulatory Visit (INDEPENDENT_AMBULATORY_CARE_PROVIDER_SITE_OTHER): Payer: Medicare Other | Admitting: *Deleted

## 2016-11-13 DIAGNOSIS — R55 Syncope and collapse: Secondary | ICD-10-CM | POA: Diagnosis not present

## 2016-11-14 NOTE — Progress Notes (Signed)
Carelink Summary Report / Loop Recorder 

## 2016-11-17 ENCOUNTER — Other Ambulatory Visit: Payer: Self-pay | Admitting: Family Medicine

## 2016-11-18 ENCOUNTER — Other Ambulatory Visit: Payer: Self-pay | Admitting: Family Medicine

## 2016-11-21 NOTE — Telephone Encounter (Signed)
Dr.Kim, The patient requested podiatry referral to cut toenails (at [facility])  Please advise  sh

## 2016-11-21 NOTE — Telephone Encounter (Signed)
Ok to refer per her request. 

## 2016-11-21 NOTE — Telephone Encounter (Signed)
Referral ordered

## 2016-11-21 NOTE — Addendum Note (Signed)
Addended by: Agnes [place] on: 11/21/2016 05:30 PM   Modules accepted: Orders

## 2016-12-04 ENCOUNTER — Other Ambulatory Visit: Payer: Self-pay | Admitting: Family Medicine

## 2016-12-11 ENCOUNTER — Ambulatory Visit (INDEPENDENT_AMBULATORY_CARE_PROVIDER_SITE_OTHER): Payer: Medicare Other | Admitting: *Deleted

## 2016-12-11 DIAGNOSIS — R55 Syncope and collapse: Secondary | ICD-10-CM | POA: Diagnosis not present

## 2016-12-12 NOTE — Progress Notes (Signed)
Carelink Summary Report / Loop Recorder 

## 2016-12-13 ENCOUNTER — Other Ambulatory Visit: Payer: Self-pay | Admitting: Family Medicine

## 2016-12-14 ENCOUNTER — Other Ambulatory Visit: Payer: Self-pay | Admitting: Family Medicine

## 2016-12-20 ENCOUNTER — Ambulatory Visit: Payer: Medicare Other | Admitting: Podiatry

## 2016-12-23 LAB — CUP PACEART REMOTE DEVICE CHECK
Date Time Interrogation Session: 20171118214335
MDC IDC PG IMPLANT DT: 20170221

## 2016-12-23 NOTE — Progress Notes (Signed)
Carelink summary report received. Battery status OK. Normal device function. No new symptom episodes, tachy episodes, brady, or pause episodes. No new AF episodes. Monthly summary reports and ROV/PRN 

## 2017-01-10 ENCOUNTER — Ambulatory Visit (INDEPENDENT_AMBULATORY_CARE_PROVIDER_SITE_OTHER): Payer: Medicare Other | Admitting: *Deleted

## 2017-01-10 ENCOUNTER — Ambulatory Visit: Payer: Medicare Other | Admitting: Podiatry

## 2017-01-10 DIAGNOSIS — R55 Syncope and collapse: Secondary | ICD-10-CM

## 2017-01-12 NOTE — Progress Notes (Signed)
Carelink Summary Report / Loop Recorder 

## 2017-01-14 NOTE — Progress Notes (Signed)
HPI:  Had AWV with Katie Mcintyre 10/2016. Katie Mcintyre is a pleasant 76 yo F with a very complicated socioeconomic and PMH, significant for DM with circulatory complications, HTN, HLD, Obesity, Chronic LE edema, Hypothyroidism, Gout, Anemia, MDD, chronic back pain and syncope here for follow up. She seeds a neurologist, cardiologist and orthopedic specialist. She has a history of poor compliance.  Reports had a cold for a few weeks in December but better now. Has been stressed and is eating sweet cereal every night which has increased her FBS to 140-180s. No regular exercise. Report no sig depression per her report today.Stressed at arrival as almost ran out of gas on the way here.   Due for labs, eye exam and repeat colon cancer screening. She reports she will set up eye exam but has to pay bill from that office first. She agrees to reschedule colonosocpy - reports had to cancel it due to URI.  DM: -circulatory complications -meds: metformin, asa, losartan, mixed humolog 75/25 1 units bid -switched to mixed insulin 04/2015 -Opthomologist: sees Dr. 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 Marcos Mcintyre at [facility] for this -walks with cane  ROS: See pertinent positives and negatives per HPI.  Past Medical History:  Diagnosis Date  .  Anemia   . Arthritis   . Cancer ([place])    cervical  . Cataract    removed both eyes with lens implant both   . Chicken pox   . Chronic bronchitis ([place])   . Colon polyp   . Depression 06/30/2013  . Diabetes mellitus without complication ([place])   . Diverticulitis   . Frequent headaches   . GERD (gastroesophageal reflux disease)   . Gout 06/30/2013  . Hyperlipidemia   . Hypertension   . S/P epidural steroid injection 09/27/2016   done in back at [place]   . Spinal stenosis of lumbar region    s/p spine surgery 02/12/13 with Dr Katie Mcintyre at [place]  . Thyroid disease   . Urine incontinence   . Use of cane as ambulatory aid     Past Surgical History:  Procedure Laterality Date  . ABDOMINAL HYSTERECTOMY  1970   complete hysterectomy for cervical cancer  . BACK SURGERY  01/2013  . BACK SURGERY  1995  . CATARACT EXTRACTION, BILATERAL     with lens implant   . CERVICAL CONE BIOPSY    . COLONOSCOPY    . EP IMPLANTABLE DEVICE N/A 02/15/2016   Procedure: Loop Recorder Insertion;  Surgeon: Katie Klein, MD;  Location: [place] [facility];  Service: Cardiovascular;  Laterality: N/A;  . POLYPECTOMY    . WRIST SURGERY  2010   for wrist fracture    Family History  Problem Relation Age of Onset  . Alcohol abuse Father   . Cirrhosis Father   . Heart attack Brother   . Breast cancer Sister   . Cancer Mother     renal  cancer  . Stroke Paternal Grandmother   . Alcohol abuse Brother   . Cancer Sister     renal cancer  . Diabetes Brother   . Heart Problems Daughter   . Healthy Daughter   . Healthy Daughter   . Healthy Son   . Healthy Son   . Arthritis      parents  . Hyperlipidemia      parent  . Hypertension      parent/grandparent  . Diabetes      parent/grandparent  . Heart disease Other   . Mental illness Other   . Colon cancer Neg Hx   . Esophageal cancer Neg Hx   . Prostate cancer Neg Hx   . Rectal cancer Neg Hx   . Seizures Neg Hx   . Colon polyps Neg Hx     Social  History   Social History  . Marital status: Divorced    Spouse name: N/A  . Number of children: 5  . Years of education: 20   Occupational History  . Retired    Social History Main Topics  . Smoking status: Former Smoker    Quit date: 12/25/1964  . Smokeless tobacco: Never Used     Comment: smoked 6 years   . Alcohol use Yes     Comment: occ with steak dinner   . Drug use: No  . Sexual activity: Not Asked   Other Topics Concern  . 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  ACCU-CHEK FASTCLIX LANCETS MISC, Use as directed, Disp: 100 each, Rfl: 1 .  ACCU-CHEK SMARTVIEW test strip, USE AS INSTRUCTED TO CHECK BLOOD SUGAR TWICE A DAY, Disp: 100 each, Rfl: 3 .  acetaminophen (TYLENOL) 500 MG tablet, Take 1,000 mg by mouth every 8 (eight) hours as needed., Disp: , Rfl:  .  allopurinol (ZYLOPRIM) 300 MG tablet, TAKE 1 TABLET BY MOUTH ONCE DAILY, Disp: 30 tablet, Rfl: 1 .  amLODipine (NORVASC) 5 MG tablet, TAKE 1 TABLET BY MOUTH DAILY, Disp: 90 tablet, Rfl: 1 .  aspirin 81 MG tablet, Take 81 mg by mouth daily., Disp: , Rfl:  .  Blood Glucose Calibration (ACCU-CHEK AVIVA) SOLN, Use as directed., Disp: 1 each, Rfl: 1 .  calcium-vitamin D (OSCAL 500/200 D-3) 500-200 MG-UNIT per tablet, Take 1 tablet by mouth daily., Disp: , Rfl:  .  cloNIDine (CATAPRES) 0.1 MG tablet, Take 1 tablet (0.1 mg total) by mouth 2 (two) times daily., Disp: 180 tablet, Rfl: 1 .  ferrous sulfate 325 (65 FE) MG tablet, Take 325 mg by mouth daily with breakfast., Disp: , Rfl:  .  glucose blood (ACCU-CHEK AVIVA PLUS) test strip, Use as instructed to check blood sugar twice a day, Disp: 100 each, Rfl: 5 .  glucose  blood (ACCU-CHEK SMARTVIEW) test strip, Use as instructed to check blood sugar twice a day, Disp: 100 each, Rfl: 1 .  HUMALOG MIX 75/25 KWIKPEN (75-25) 100 UNIT/ML Kwikpen, INJECT 10 UNITS INTO THE SKIN 2 (TWO) TIMES DAILY BEFORE A MEAL., Disp: 15  pen, Rfl: 4 .  HUMALOG MIX 75/25 KWIKPEN (75-25) 100 UNIT/ML Kwikpen, INJECT 10 UNITS INTO THE SKIN 2 (TWO) TIMES DAILY BEFORE A MEAL., Disp: 15 mL, Rfl: 3 .  hydrALAZINE (APRESOLINE) 25 MG tablet, TAKE 1 TABLET (25 MG TOTAL) BY MOUTH 2 (TWO) TIMES DAILY., Disp: 180 tablet, Rfl: 1 .  Insulin Pen Needle (B-D UF III MINI PEN NEEDLES) 31G X 5 MM MISC, USE TO INJECT INSULIN TWICE A DAY, Disp: 100 each, Rfl: 4 .  isosorbide dinitrate (ISORDIL) 20 MG tablet, TAKE 1 TABLET (20 MG TOTAL) BY MOUTH 2 (TWO) TIMES DAILY., Disp: 60 tablet, Rfl: 5 .  isosorbide dinitrate (ISORDIL) 20 MG tablet, TAKE 1 TABLET (20 MG TOTAL) BY MOUTH 2 (TWO) TIMES DAILY., Disp: 180 tablet, Rfl: 1 .  Lancet Devices (ACCU-CHEK SOFTCLIX) lancets, Use as instructed twice a day, Disp: 1 each, Rfl: 5 .  levothyroxine (SYNTHROID, LEVOTHROID) 137 MCG tablet, TAKE ONE TABLET BY MOUTH ONCE DAILY BEFORE BREAKFAST, Disp: 90 tablet, Rfl: 1 .  losartan (COZAAR) 50 MG tablet, TAKE ONE TABLET BY MOUTH ONCE DAILY, Disp: 90 tablet, Rfl: 1 .  metFORMIN (GLUCOPHAGE) 1000 MG tablet, TAKE ONE TABLET BY MOUTH TWICE DAILY WITH MEALS, Disp: 180 tablet, Rfl: 1 .  Sennosides-Docusate Sodium (STOOL SOFTENER & LAXATIVE PO), Take 1 tablet by mouth daily as needed (constipation). , Disp: , Rfl:  .  simvastatin (ZOCOR) 20 MG tablet, TAKE 1 TABLET BY MOUTH EVERY DAY, Disp: 90 tablet, Rfl: 1 .  vitamin B-12 (CYANOCOBALAMIN) 1000 MCG tablet, Take 1,000 mcg by mouth daily., Disp: , Rfl:   Current Facility-Administered Medications:  .  ipratropium-albuterol (DUONEB) 0.5-2.5 (3) MG/3ML nebulizer solution 3 mL, 3 mL, Nebulization, Once, Delano Metz, FNP  EXAM:  Vitals:   01/15/17 1155  BP: 120/72  Pulse: 62  Temp:  97.6 F (36.4 C)    Body mass index is 45.76 kg/m.  GENERAL: vitals reviewed and listed above, alert, oriented, appears well hydrated and in no acute distress  HEENT: atraumatic, conjunttiva clear, no obvious abnormalities on inspection of external nose and ears  NECK: no obvious masses on inspection  LUNGS: clear to auscultation bilaterally, no wheezes, rales or rhonchi, good air movement  CV: HRRR, no peripheral edema  MS: moves all extremities without noticeable abnormality  PSYCH: pleasant and cooperative, no obvious depression or anxiety  ASSESSMENT AND PLAN:  Discussed the following assessment and plan:  Essential hypertension, benign - Plan: Basic metabolic panel, CBC (no diff)  Hyperlipidemia, unspecified hyperlipidemia type  Hypothyroidism, unspecified type - Plan: TSH  Iron deficiency anemia, unspecified iron deficiency anemia type  Type 2 diabetes mellitus with other circulatory complication, with long-term current use of insulin ([place]) - Plan: Hemoglobin A1c  Morbid obesity ([place])  -labs -lifestyle recs -advised to increase insulin slowly every 5 days, monitoring FBS OR change diet -handicap decal form completed -Patient advised to return or notify a doctor immediately if symptoms worsen or persist or new concerns arise.  Patient Instructions  BEFORE YOU LEAVE: -labs -handicap parking form -follow up: 3 months  We have ordered labs or studies at this visit. It can take up to 1-2 weeks for results and processing. IF results require follow up or explanation, we will call you with instructions. Clinically stable results will be released to your [facility]. If you have not heard from [place] or cannot find your results in [facility] in 2 weeks please contact our office at [phone].  If you are not yet signed up for  MYCHART, please consider signing up.   Stop eating cereal at night or increase insulin by 2 units and monitor.   We recommend the following healthy  lifestyle for LIFE: 1) Small portions.   Tip: eat off of a salad plate instead of a dinner plate.  Tip: if you need more or a snack choose fruits, veggies and/or a handful of nuts or seeds.  2) Eat a healthy clean diet.  * Tip: Avoid (less then 1 serving per week): processed foods, sweets, sweetened drinks, white starches (rice, flour, bread, potatoes, pasta, etc), red meat, fast foods, butter  *Tip: CHOOSE instead   * 5-9 servings per day of fresh or frozen fruits and vegetables (but not corn, potatoes, bananas, canned or dried fruit)   *nuts and seeds, beans   *olives and olive oil   *small portions of lean meats such as fish and white chicken    *small portions of whole grains  3)Get at least 150 minutes of sweaty aerobic exercise per week.  4)Reduce stress - consider counseling, meditation and relaxation to balance other aspects of your life.           Colin Benton R., DO

## 2017-01-15 ENCOUNTER — Encounter: Payer: Self-pay | Admitting: Family Medicine

## 2017-01-15 ENCOUNTER — Ambulatory Visit (INDEPENDENT_AMBULATORY_CARE_PROVIDER_SITE_OTHER): Payer: Medicare Other | Admitting: Family Medicine

## 2017-01-15 VITALS — BP 120/72 | HR 62 | Temp 97.6°F | Ht 61.0 in | Wt 242.2 lb

## 2017-01-15 DIAGNOSIS — E039 Hypothyroidism, unspecified: Secondary | ICD-10-CM

## 2017-01-15 DIAGNOSIS — I10 Essential (primary) hypertension: Secondary | ICD-10-CM

## 2017-01-15 DIAGNOSIS — Z794 Long term (current) use of insulin: Secondary | ICD-10-CM

## 2017-01-15 DIAGNOSIS — E1159 Type 2 diabetes mellitus with other circulatory complications: Secondary | ICD-10-CM | POA: Diagnosis not present

## 2017-01-15 DIAGNOSIS — E785 Hyperlipidemia, unspecified: Secondary | ICD-10-CM

## 2017-01-15 DIAGNOSIS — D509 Iron deficiency anemia, unspecified: Secondary | ICD-10-CM

## 2017-01-15 LAB — CBC
HEMATOCRIT: 36.9 % (ref 36.0–46.0)
HEMOGLOBIN: 12.3 g/dL (ref 12.0–15.0)
MCHC: 33.3 g/dL (ref 30.0–36.0)
MCV: 85.3 fl (ref 78.0–100.0)
PLATELETS: 294 10*3/uL (ref 150.0–400.0)
RBC: 4.33 Mil/uL (ref 3.87–5.11)
RDW: 14.6 % (ref 11.5–15.5)
WBC: 5.1 10*3/uL (ref 4.0–10.5)

## 2017-01-15 LAB — BASIC METABOLIC PANEL
BUN: 15 mg/dL (ref 6–23)
CHLORIDE: 102 meq/L (ref 96–112)
CO2: 29 meq/L (ref 19–32)
Calcium: 9.4 mg/dL (ref 8.4–10.5)
Creatinine, Ser: 0.73 mg/dL (ref 0.40–1.20)
GFR: 99.47 mL/min (ref >60.00)
Glucose, Bld: 148 mg/dL — ABNORMAL HIGH (ref 70–99)
POTASSIUM: 4.2 meq/L (ref 3.5–5.1)
SODIUM: 138 meq/L (ref 135–145)

## 2017-01-15 LAB — TSH: TSH: 0.88 u[IU]/mL (ref 0.35–4.50)

## 2017-01-15 LAB — HEMOGLOBIN A1C: Hgb A1c MFr Bld: 7.7 % — ABNORMAL HIGH (ref 4.6–6.5)

## 2017-01-15 NOTE — Patient Instructions (Signed)
BEFORE YOU LEAVE: -labs -handicap parking form -follow up: 3 months  We have ordered labs or studies at this visit. It can take up to 1-2 weeks for results and processing. IF results require follow up or explanation, we will call you with instructions. Clinically stable results will be released to your [facility]. If you have not heard from [place] or cannot find your results in [facility] in 2 weeks please contact our office at [phone].  If you are not yet signed up for [facility], please consider signing up.   Stop eating cereal at night or increase insulin by 2 units and monitor.   We recommend the following healthy lifestyle for LIFE: 1) Small portions.   Tip: eat off of a salad plate instead of a dinner plate.  Tip: if you need more or a snack choose fruits, veggies and/or a handful of nuts or seeds.  2) Eat a healthy clean diet.  * Tip: Avoid (less then 1 serving per week): processed foods, sweets, sweetened drinks, white starches (rice, flour, bread, potatoes, pasta, etc), red meat, fast foods, butter  *Tip: CHOOSE instead   * 5-9 servings per day of fresh or frozen fruits and vegetables (but not corn, potatoes, bananas, canned or dried fruit)   *nuts and seeds, beans   *olives and olive oil   *small portions of lean meats such as fish and white chicken    *small portions of whole grains  3)Get at least 150 minutes of sweaty aerobic exercise per week.  4)Reduce stress - consider counseling, meditation and relaxation to balance other aspects of your life.

## 2017-01-15 NOTE — Progress Notes (Signed)
Pre visit review using our clinic review tool, if applicable. No additional management support is needed unless otherwise documented below in the visit note. 

## 2017-01-24 ENCOUNTER — Ambulatory Visit: Payer: Medicare Other | Admitting: Podiatry

## 2017-01-24 ENCOUNTER — Other Ambulatory Visit: Payer: Self-pay | Admitting: Internal Medicine

## 2017-01-24 ENCOUNTER — Encounter: Payer: Self-pay | Admitting: Internal Medicine

## 2017-01-30 LAB — CUP PACEART REMOTE DEVICE CHECK
Implantable Pulse Generator Implant Date: 20170221
MDC IDC SESS DTM: 20171218223953

## 2017-02-07 ENCOUNTER — Ambulatory Visit: Payer: Medicare Other | Admitting: Podiatry

## 2017-02-09 ENCOUNTER — Ambulatory Visit (INDEPENDENT_AMBULATORY_CARE_PROVIDER_SITE_OTHER): Payer: Medicare Other | Admitting: *Deleted

## 2017-02-09 DIAGNOSIS — R55 Syncope and collapse: Secondary | ICD-10-CM

## 2017-02-12 NOTE — Progress Notes (Signed)
Carelink Summary Report / Loop Recorder 

## 2017-02-15 ENCOUNTER — Other Ambulatory Visit: Payer: Self-pay | Admitting: *Deleted

## 2017-02-15 MED ORDER — ISOSORBIDE DINITRATE 20 MG PO TABS: ORAL_TABLET | ORAL | 1 refills | Status: DC

## 2017-02-15 NOTE — Telephone Encounter (Signed)
Rx done. 

## 2017-02-16 LAB — CUP PACEART REMOTE DEVICE CHECK
MDC IDC PG IMPLANT DT: 20170221
MDC IDC SESS DTM: 20180117230716

## 2017-02-21 ENCOUNTER — Ambulatory Visit: Payer: Medicare Other | Admitting: Podiatry

## 2017-02-21 DIAGNOSIS — R52 Pain, unspecified: Secondary | ICD-10-CM

## 2017-03-01 LAB — CUP PACEART REMOTE DEVICE CHECK
Date Time Interrogation Session: 20180216230740
MDC IDC PG IMPLANT DT: 20170221

## 2017-03-07 ENCOUNTER — Encounter: Payer: Self-pay | Admitting: Podiatry

## 2017-03-07 ENCOUNTER — Ambulatory Visit (INDEPENDENT_AMBULATORY_CARE_PROVIDER_SITE_OTHER): Payer: Medicare Other | Admitting: Podiatry

## 2017-03-07 VITALS — BP 142/82 | HR 78

## 2017-03-07 DIAGNOSIS — M79609 Pain in unspecified limb: Secondary | ICD-10-CM

## 2017-03-07 DIAGNOSIS — B351 Tinea unguium: Secondary | ICD-10-CM

## 2017-03-07 DIAGNOSIS — L608 Other nail disorders: Secondary | ICD-10-CM

## 2017-03-07 DIAGNOSIS — L603 Nail dystrophy: Secondary | ICD-10-CM | POA: Diagnosis not present

## 2017-03-07 DIAGNOSIS — E0843 Diabetes mellitus due to underlying condition with diabetic autonomic (poly)neuropathy: Secondary | ICD-10-CM

## 2017-03-07 NOTE — Progress Notes (Signed)
   Subjective:    Patient ID: Katie Mcintyre, female    DOB: [birth year], 76 y.o.   MRN: [id number]  HPI    Review of Systems  Constitutional: Positive for fatigue.  Respiratory: Positive for shortness of breath.   Cardiovascular: Positive for leg swelling.  Gastrointestinal: Positive for constipation.  Endocrine: Positive for cold intolerance and polydipsia.  Neurological: Positive for weakness.  Hematological: Bruises/bleeds easily.  All other systems reviewed and are negative.      Objective:   Physical Exam        Assessment & Plan:

## 2017-03-12 ENCOUNTER — Ambulatory Visit (INDEPENDENT_AMBULATORY_CARE_PROVIDER_SITE_OTHER): Payer: Medicare Other | Admitting: *Deleted

## 2017-03-12 DIAGNOSIS — R55 Syncope and collapse: Secondary | ICD-10-CM

## 2017-03-12 NOTE — Progress Notes (Signed)
Carelink Summary Report / Loop Recorder 

## 2017-03-17 NOTE — Progress Notes (Signed)
Patient ID: Katie Mcintyre, female   DOB: 1941/04/22, 76 y.o.   MRN: [id number]   SUBJECTIVE Patient with a history of diabetes mellitus presents to office today complaining of elongated, thickened nails. Pain while ambulating in shoes. Patient is unable to trim their own nails.   OBJECTIVE General Patient is awake, alert, and oriented x 3 and in no acute distress. Derm Skin is dry and supple bilateral. Negative open lesions or macerations. Remaining integument unremarkable. Nails are tender, long, thickened and dystrophic with subungual debris, consistent with onychomycosis, 1-5 bilateral. No signs of infection noted. Vasc  DP and PT pedal pulses palpable bilaterally. Temperature gradient within normal limits.  Neuro Epicritic and protective threshold sensation diminished bilaterally.  Musculoskeletal Exam No symptomatic pedal deformities noted bilateral. Muscular strength within normal limits.  ASSESSMENT 1. Diabetes Mellitus w/ peripheral neuropathy 2. Onychomycosis of nail due to dermatophyte bilateral 3. Pain in foot bilateral  PLAN OF CARE 1. Patient evaluated today. 2. Instructed to maintain good pedal hygiene and foot care. Stressed importance of controlling blood sugar.  3. Mechanical debridement of nails 1-5 bilaterally performed using a nail nipper. Filed with dremel without incident.  4. Return to clinic in 3 mos.     Edrick Kins, DPM [facility]  Dr. Edrick Kins, [address]                                        [place], [place] [address]                Office [phone]  Fax [phone]

## 2017-03-17 NOTE — Addendum Note (Signed)
Addended by: Edrick Kins on: 03/17/2017 02:19 PM   Modules accepted: Level of Service

## 2017-03-19 ENCOUNTER — Other Ambulatory Visit: Payer: Self-pay | Admitting: *Deleted

## 2017-03-19 MED ORDER — HYDRALAZINE HCL 25 MG PO TABS: ORAL_TABLET | ORAL | 1 refills | Status: DC

## 2017-03-19 MED ORDER — ISOSORBIDE DINITRATE 20 MG PO TABS: ORAL_TABLET | ORAL | 1 refills | Status: DC

## 2017-03-19 NOTE — Telephone Encounter (Signed)
Rx done. 

## 2017-03-26 LAB — CUP PACEART REMOTE DEVICE CHECK
Date Time Interrogation Session: 20180318233824
MDC IDC PG IMPLANT DT: 20170221

## 2017-03-26 NOTE — Progress Notes (Signed)
HPI:  Acute visit for:  Cough and wheezing: -cold for about 1 month -reports feeling wheezy for about 1 week -cough productive clear mucus, also clear sinus congestion -feels mildly SOB at times as well -felt like had a fever initially a few weeks ago -no hemoptysis, rash, body aches, vomiting, hx COPD or asthma - but reports was hospitalized years ago for "bronchitis" and used inhaler and prednisone -blood sugars 1 teens to 130 at home  History: Katie Mcintyre is a pleasant 76 yo F with a very complicated socioeconomic and PMH, significant for DM with circulatory complications, HTN, HLD, Obesity, Chronic LE edema, Hypothyroidism, Gout, Anemia, MDD, chronic back pain and syncope here for follow up. She sees a neurologist, cardiologist and orthopedic specialist.   ROS: See pertinent positives and negatives per HPI.  Past Medical History:  Diagnosis Date  . Anemia   . Arthritis   . Cancer ([place])    cervical  . Cataract    removed both eyes with lens implant both   . Chicken pox   . Chronic bronchitis ([place])   . Colon polyp   . Depression 06/30/2013  . Diabetes mellitus without complication ([place])   . Diverticulitis   . Frequent headaches   . GERD (gastroesophageal reflux disease)   . Gout 06/30/2013  . Hyperlipidemia   . Hypertension   . S/P epidural steroid injection 09/27/2016   done in back at [facility]   . Spinal stenosis of lumbar region    s/p spine surgery 02/12/13 with Dr Marcos Eke at [facility]  . Thyroid disease   . Urine incontinence   . Use of cane as ambulatory aid     Past Surgical History:  Procedure Laterality Date  . ABDOMINAL HYSTERECTOMY  1970   complete hysterectomy for cervical cancer  . BACK SURGERY  01/2013  . BACK SURGERY  1995  . CATARACT EXTRACTION, BILATERAL     with lens implant   . CERVICAL CONE BIOPSY    . COLONOSCOPY    . EP IMPLANTABLE DEVICE N/A 02/15/2016   Procedure: Loop Recorder Insertion;  Surgeon: Sanda Klein, MD;  Location: [place]  [facility];  Service: Cardiovascular;  Laterality: N/A;  . POLYPECTOMY    . WRIST SURGERY  2010   for wrist fracture    Family History  Problem Relation Age of Onset  . Alcohol abuse Father   . Cirrhosis Father   . Heart attack Brother   . Breast cancer Sister   . Cancer Mother     renal cancer  . Stroke Paternal Grandmother   . Alcohol abuse Brother   . Cancer Sister     renal cancer  . Diabetes Brother   . Heart Problems Daughter   . Healthy Daughter   . Healthy Daughter   . Healthy Son   . Healthy Son   . Arthritis      parents  . Hyperlipidemia      parent  . Hypertension      parent/grandparent  . Diabetes      parent/grandparent  . Heart disease Other   . Mental illness Other   . Colon cancer Neg Hx   . Esophageal cancer Neg Hx   . Prostate cancer Neg Hx   . Rectal cancer Neg Hx   . Seizures Neg Hx   . Colon polyps Neg Hx     Social History   Social History  . Marital status: Divorced    Spouse name: N/A  .  Number of children: 5  . Years of education: 20   Occupational History  . Retired    Social History Main Topics  . Smoking status: Former Smoker    Quit date: 12/25/1964  . Smokeless tobacco: Never Used     Comment: smoked 6 years   . Alcohol use Yes     Comment: occ with steak dinner   . Drug use: No  . Sexual activity: Not Asked   Other Topics Concern  . 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  ACCU-CHEK FASTCLIX LANCETS MISC, Use as directed, Disp: 100 each, Rfl: 1 .  ACCU-CHEK SMARTVIEW test strip, USE AS INSTRUCTED TO CHECK BLOOD SUGAR TWICE A DAY, Disp: 100 each, Rfl: 3 .  acetaminophen (TYLENOL) 500 MG tablet, Take 1,000 mg by  mouth every 8 (eight) hours as needed., Disp: , Rfl:  .  allopurinol (ZYLOPRIM) 300 MG tablet, TAKE 1 TABLET BY MOUTH ONCE DAILY, Disp: 30 tablet, Rfl: 1 .  amLODipine (NORVASC) 5 MG tablet, TAKE 1 TABLET BY MOUTH DAILY, Disp: 90 tablet, Rfl: 1 .  aspirin 81 MG tablet, Take 81 mg by mouth daily., Disp: , Rfl:  .  Blood Glucose Calibration (ACCU-CHEK AVIVA) SOLN, Use as directed., Disp: 1 each, Rfl: 1 .  calcium-vitamin D (OSCAL 500/200 D-3) 500-200 MG-UNIT per tablet, Take 1 tablet by mouth daily., Disp: , Rfl:  .  cloNIDine (CATAPRES) 0.1 MG tablet, Take 1 tablet (0.1 mg total) by mouth 2 (two) times daily., Disp: 180 tablet, Rfl: 1 .  ferrous sulfate 325 (65 FE) MG tablet, Take 325 mg by mouth daily with breakfast., Disp: , Rfl:  .  glucose blood (ACCU-CHEK AVIVA PLUS) test strip, Use as instructed to check blood sugar twice a day, Disp: 100 each, Rfl: 5 .  glucose blood (ACCU-CHEK SMARTVIEW) test strip, Use as instructed to check blood sugar twice a day, Disp: 100 each, Rfl: 1 .  HUMALOG MIX 75/25 KWIKPEN (75-25) 100 UNIT/ML Kwikpen, INJECT 10 UNITS INTO THE SKIN 2 (TWO) TIMES DAILY BEFORE A MEAL., Disp: 15 pen, Rfl: 4 .  HUMALOG MIX 75/25 KWIKPEN (75-25) 100 UNIT/ML Kwikpen, INJECT 10 UNITS INTO THE SKIN 2 (TWO) TIMES DAILY BEFORE A MEAL., Disp: 15 mL, Rfl: 3 .  hydrALAZINE (APRESOLINE) 25 MG tablet, TAKE 1 TABLET (25 MG TOTAL) BY MOUTH 2 (TWO) TIMES DAILY., Disp: 180 tablet, Rfl: 1 .  Insulin Pen Needle (B-D UF III MINI PEN NEEDLES) 31G X 5 MM MISC, USE TO INJECT INSULIN TWICE A DAY, Disp: 100 each, Rfl: 4 .  isosorbide dinitrate (ISORDIL) 20 MG tablet, TAKE 1 TABLET (20 MG TOTAL) BY MOUTH 2 (TWO) TIMES DAILY., Disp: 60 tablet, Rfl: 5 .  isosorbide dinitrate (ISORDIL) 20 MG tablet, TAKE 1 TABLET (20 MG TOTAL) BY MOUTH 2 (TWO) TIMES DAILY., Disp: 180 tablet, Rfl: 1 .  Lancet Devices (ACCU-CHEK SOFTCLIX) lancets, Use as instructed twice a day, Disp: 1 each, Rfl: 5 .  levothyroxine (SYNTHROID,  LEVOTHROID) 137 MCG tablet, TAKE ONE TABLET BY MOUTH ONCE  DAILY BEFORE BREAKFAST, Disp: 90 tablet, Rfl: 1 .  losartan (COZAAR) 50 MG tablet, TAKE ONE TABLET BY MOUTH ONCE DAILY, Disp: 90 tablet, Rfl: 1 .  metFORMIN (GLUCOPHAGE) 1000 MG tablet, TAKE ONE TABLET BY MOUTH TWICE DAILY WITH MEALS, Disp: 180 tablet, Rfl: 1 .  Sennosides-Docusate Sodium (STOOL SOFTENER & LAXATIVE PO), Take 1 tablet by mouth daily as needed (constipation). , Disp: , Rfl:  .  simvastatin (ZOCOR) 20 MG tablet, TAKE 1 TABLET BY MOUTH EVERY DAY, Disp: 90 tablet, Rfl: 1 .  vitamin B-12 (CYANOCOBALAMIN) 1000 MCG tablet, Take 1,000 mcg by mouth daily., Disp: , Rfl:  .  albuterol (PROAIR HFA) 108 (90 Base) MCG/ACT inhaler, Inhale 2 puffs into the lungs every 6 (six) hours as needed for wheezing or shortness of breath., Disp: 1 Inhaler, Rfl: 0 .  benzonatate (TESSALON) 100 MG capsule, Take 1 capsule (100 mg total) by mouth 2 (two) times daily as needed for cough., Disp: 20 capsule, Rfl: 0  Current Facility-Administered Medications:  .  ipratropium-albuterol (DUONEB) 0.5-2.5 (3) MG/3ML nebulizer solution 3 mL, 3 mL, Nebulization, Once, Delano Metz, FNP  EXAM:  Vitals:   03/27/17 0939  BP: (!) 144/80  Pulse: 80  Temp: 98.4 F (36.9 C)    Body mass index is 45.37 kg/m.  GENERAL: vitals reviewed and listed above, alert, oriented, appears well hydrated and in no acute distress  HEENT: atraumatic, conjunttiva clear, no obvious abnormalities on inspection of external nose and ears  NECK: no obvious masses on inspection  LUNGS: scattered exp wheezing  CV: HRRR, no peripheral edema  MS: moves all extremities without noticeable abnormality  PSYCH: pleasant and cooperative, no obvious depression or anxiety  ASSESSMENT AND PLAN:  Discussed the following assessment and plan:  Respiratory illness - Plan: DG Chest 2 View  Cough - Plan: DG Chest 2 View  -neb tx with improvement in wheezing and cough -cxr  pending -discussed risks/benefits treatment options, return precuations -alb and tessalon prn, likely will add prednisone and abx if any signs infection on cxr or if not improving with tx -may need to consider PFTs when feeling better -recheck BP at follow up in a few weeks -Patient advised to return or notify a doctor immediately if symptoms worsen or persist or new concerns arise.  Patient Instructions  BEFORE YOU LEAVE: -follow up: keep follow up as scheduled - follow up sooner if worsening or not improving -xray sheet  Go get the chest xray today.   Use the tessalon for cough as needed per instructions. Use the proair (albuterol) as needed every 4-6 hours for wheezing. We likely will also add prednisone and possibly and antibiotic depending on the xray results.  I hope you are feeling better soon! Seek care immediately if worsening, new concerns or you are not improving with treatment.        Colin Benton R., DO

## 2017-03-27 ENCOUNTER — Ambulatory Visit (INDEPENDENT_AMBULATORY_CARE_PROVIDER_SITE_OTHER)
Admission: RE | Admit: 2017-03-27 | Discharge: 2017-03-27 | Disposition: A | Discharge status: Home / Self Care | Payer: Medicare Other | Source: Ambulatory Visit | Attending: Family Medicine | Admitting: Family Medicine

## 2017-03-27 ENCOUNTER — Ambulatory Visit (INDEPENDENT_AMBULATORY_CARE_PROVIDER_SITE_OTHER): Payer: Medicare Other | Admitting: Family Medicine

## 2017-03-27 ENCOUNTER — Encounter: Payer: Self-pay | Admitting: Family Medicine

## 2017-03-27 VITALS — BP 144/80 | HR 80 | Temp 98.4°F | Ht 61.0 in | Wt 240.1 lb

## 2017-03-27 DIAGNOSIS — R05 Cough: Secondary | ICD-10-CM | POA: Diagnosis not present

## 2017-03-27 DIAGNOSIS — J989 Respiratory disorder, unspecified: Secondary | ICD-10-CM

## 2017-03-27 DIAGNOSIS — R059 Cough, unspecified: Secondary | ICD-10-CM

## 2017-03-27 MED ORDER — ALBUTEROL SULFATE (2.5 MG/3ML) 0.083% IN NEBU
2.5000 mg | INHALATION_SOLUTION | Freq: Once | RESPIRATORY_TRACT | Status: AC
  Administered 2017-03-27: 2.5 mg via RESPIRATORY_TRACT

## 2017-03-27 MED ORDER — BENZONATATE 100 MG PO CAPS: 100.0000 mg | ORAL_CAPSULE | Freq: Two times a day (BID) | ORAL | 0 refills | Status: DC | PRN

## 2017-03-27 MED ORDER — ALBUTEROL SULFATE HFA 108 (90 BASE) MCG/ACT IN AERS: 2.0000 | INHALATION_SPRAY | Freq: Four times a day (QID) | RESPIRATORY_TRACT | 0 refills | Status: DC | PRN

## 2017-03-27 MED ORDER — PREDNISONE 20 MG PO TABS: ORAL_TABLET | ORAL | 0 refills | Status: DC

## 2017-03-27 NOTE — Progress Notes (Signed)
Pre visit review using our clinic review tool, if applicable. No additional management support is needed unless otherwise documented below in the visit note. 

## 2017-03-27 NOTE — Addendum Note (Signed)
Addended by: Agnes Lawrence on: 03/27/2017 03:51 PM   Modules accepted: Orders

## 2017-03-27 NOTE — Patient Instructions (Signed)
BEFORE YOU LEAVE: -follow up: keep follow up as scheduled - follow up sooner if worsening or not improving -xray sheet  Go get the chest xray today.   Use the tessalon for cough as needed per instructions. Use the proair (albuterol) as needed every 4-6 hours for wheezing. We likely will also add prednisone and possibly and antibiotic depending on the xray results.  I hope you are feeling better soon! Seek care immediately if worsening, new concerns or you are not improving with treatment.

## 2017-03-27 NOTE — Addendum Note (Signed)
Addended by: Agnes Lawrence on: 03/27/2017 10:07 AM   Modules accepted: Orders

## 2017-04-10 ENCOUNTER — Ambulatory Visit (INDEPENDENT_AMBULATORY_CARE_PROVIDER_SITE_OTHER): Payer: Medicare Other | Admitting: *Deleted

## 2017-04-10 DIAGNOSIS — R55 Syncope and collapse: Secondary | ICD-10-CM

## 2017-04-11 NOTE — Progress Notes (Signed)
Carelink Summary Report / Loop Recorder 

## 2017-04-16 ENCOUNTER — Encounter: Payer: Self-pay | Admitting: Family Medicine

## 2017-04-16 ENCOUNTER — Ambulatory Visit (INDEPENDENT_AMBULATORY_CARE_PROVIDER_SITE_OTHER): Payer: Medicare Other | Admitting: Family Medicine

## 2017-04-16 VITALS — BP 132/60 | HR 81 | Temp 97.5°F | Ht 61.0 in | Wt 240.9 lb

## 2017-04-16 DIAGNOSIS — D509 Iron deficiency anemia, unspecified: Secondary | ICD-10-CM

## 2017-04-16 DIAGNOSIS — E785 Hyperlipidemia, unspecified: Secondary | ICD-10-CM | POA: Diagnosis not present

## 2017-04-16 DIAGNOSIS — Z794 Long term (current) use of insulin: Secondary | ICD-10-CM | POA: Diagnosis not present

## 2017-04-16 DIAGNOSIS — I10 Essential (primary) hypertension: Secondary | ICD-10-CM

## 2017-04-16 DIAGNOSIS — E1159 Type 2 diabetes mellitus with other circulatory complications: Secondary | ICD-10-CM | POA: Diagnosis not present

## 2017-04-16 DIAGNOSIS — E039 Hypothyroidism, unspecified: Secondary | ICD-10-CM | POA: Diagnosis not present

## 2017-04-16 LAB — CBC
HCT: 36.7 % (ref 36.0–46.0)
Hemoglobin: 11.9 g/dL — ABNORMAL LOW (ref 12.0–15.0)
MCHC: 32.5 g/dL (ref 30.0–36.0)
MCV: 84.9 fl (ref 78.0–100.0)
Platelets: 276 10*3/uL (ref 150.0–400.0)
RBC: 4.32 Mil/uL (ref 3.87–5.11)
RDW: 14.7 % (ref 11.5–15.5)
WBC: 4.7 10*3/uL (ref 4.0–10.5)

## 2017-04-16 LAB — BASIC METABOLIC PANEL
BUN: 17 mg/dL (ref 6–23)
CO2: 29 meq/L (ref 19–32)
CREATININE: 0.76 mg/dL (ref 0.40–1.20)
Calcium: 9.6 mg/dL (ref 8.4–10.5)
Chloride: 104 mEq/L (ref 96–112)
GFR: 94.89 mL/min (ref >60.00)
Glucose, Bld: 147 mg/dL — ABNORMAL HIGH (ref 70–99)
Potassium: 4.3 mEq/L (ref 3.5–5.1)
SODIUM: 139 meq/L (ref 135–145)

## 2017-04-16 LAB — TSH: TSH: 0.25 u[IU]/mL — ABNORMAL LOW (ref 0.35–4.50)

## 2017-04-16 LAB — HEMOGLOBIN A1C: Hgb A1c MFr Bld: 8.1 % — ABNORMAL HIGH (ref 4.6–6.5)

## 2017-04-16 NOTE — Patient Instructions (Signed)
BEFORE YOU LEAVE: -follow up: 3 months -labs  Claritin daily  Albuterol as needed  Please call to schedule your colonoscopy  We have ordered labs or studies at this visit. It can take up to 1-2 weeks for results and processing. IF results require follow up or explanation, we will call you with instructions. Clinically stable results will be released to your [facility]. If you have not heard from [place] or cannot find your results in [facility] in 2 weeks please contact our office at [phone].  If you are not yet signed up for [facility], please consider signing up.   We recommend the following healthy lifestyle for LIFE: 1) Small portions.   Tip: eat off of a salad plate instead of a dinner plate.  Tip: It is ok to feel hungry after a meal - that likely means you ate an appropriate portion.  Tip: if you need more or a snack choose fruits, veggies and/or a handful of nuts or seeds.  2) Eat a healthy clean diet.  * Tip: Avoid (less then 1 serving per week): processed foods, sweets, sweetened drinks, white starches (rice, flour, bread, potatoes, pasta, etc), red meat, fast foods, butter  *Tip: CHOOSE instead   * 5-9 servings per day of fresh or frozen fruits and vegetables (but not corn, potatoes, bananas, canned or dried fruit)   *nuts and seeds, beans   *olives and olive oil   *small portions of lean meats such as fish and white chicken    *small portions of whole grains  3)Get at least 150 minutes of sweaty aerobic exercise per week.  4)Reduce stress - consider counseling, meditation and relaxation to balance other aspects of your life.

## 2017-04-16 NOTE — Progress Notes (Signed)
Pre visit review using our clinic review tool, if applicable. No additional management support is needed unless otherwise documented below in the visit note. 

## 2017-04-16 NOTE — Progress Notes (Signed)
HPI:  AWV 10/2016  Katie Mcintyre is a pleasant 76 y.o. here for follow up. Chronic medical problems summarized below were reviewed for changes and stability and were updated as needed below. These issues and their treatment remain stable for the most part. Recently treated for bronchitis. Reports she felt much better with treatment. Still with some nasal congestion and cough. Need refill on alb. Not taking anything for her allergies. Used to take allegra. No regular exercise. Diet not great. FBS 97-163 with ave 1 teens to 120s. No lows. 13 units insulin bid. Denies CP, SOB, DOE, treatment intolerance or new symptoms. Due for foot exam (done today), labs (doing today), colon ca screening (agrees to call to reschedule), eye exam (due).  DM: -circulatory complications -meds: metformin, asa, losartan, mixed humolog 75/25 13 units bid -switched to mixed insulin 04/2015 -Opthomologist: sees Dr. Zadie Rhine  HLD/Obesity: -meds simvastatin 20 -poor lifestyle  HTN/Chronic LE edema: -meds: isosorbide, hydralazine, clonidine, metoprolol, losartan, norvasc -chronic LE edema intermittently - non-compliant with compression  Hypothyroid:  -on levothyroxine  Anemia:  -on iron therapy from prior PCP Had colonoscopy with polpys, repeat advised in 08/2016   Hx Gout: -on allopurinol from prior PCP  Depression: -meds: none currently  Hx Pre-syncope/AMS events: -s/p eval with neurology and seeing cardiology in 2017 with Echo, has implanted loop recorder -on review OSH records hx abnormal EKG,LAFB in the past per notes  Chronic back pain/lumbar radiculopathy and spinal stenosis: -seeing Dr. Marcos Eke at [facility] for this -walks with cane  ROS: See pertinent positives and negatives per HPI.  Past Medical History:  Diagnosis Date  . Anemia   . Arthritis   . Cancer ([place])    cervical  . Cataract    removed both eyes with lens implant both   . Chicken pox   . Chronic bronchitis ([place])    . Colon polyp   . Depression 06/30/2013  . Diabetes mellitus without complication ([place])   . Diverticulitis   . Frequent headaches   . GERD (gastroesophageal reflux disease)   . Gout 06/30/2013  . Hyperlipidemia   . Hypertension   . S/P epidural steroid injection 09/27/2016   done in back at [facility]   . Spinal stenosis of lumbar region    s/p spine surgery 02/12/13 with Dr Marcos Eke at [facility]  . Thyroid disease   . Urine incontinence   . Use of cane as ambulatory aid     Past Surgical History:  Procedure Laterality Date  . ABDOMINAL HYSTERECTOMY  1970   complete hysterectomy for cervical cancer  . BACK SURGERY  01/2013  . BACK SURGERY  1995  . CATARACT EXTRACTION, BILATERAL     with lens implant   . CERVICAL CONE BIOPSY    . COLONOSCOPY    . EP IMPLANTABLE DEVICE N/A 02/15/2016   Procedure: Loop Recorder Insertion;  Surgeon: Sanda Klein, MD;  Location: [facility];  Service: Cardiovascular;  Laterality: N/A;  . POLYPECTOMY    . WRIST SURGERY  2010   for wrist fracture    Family History  Problem Relation Age of Onset  . Alcohol abuse Father   . Cirrhosis Father   . Heart attack Brother   . Breast cancer Sister   . Cancer Mother     renal cancer  . Stroke Paternal Grandmother   . Alcohol abuse Brother   . Cancer Sister     renal cancer  . Diabetes Brother   . Heart Problems Daughter   .  Healthy Daughter   . Healthy Daughter   . Healthy Son   . Healthy Son   . Arthritis      parents  . Hyperlipidemia      parent  . Hypertension      parent/grandparent  . Diabetes      parent/grandparent  . Heart disease Other   . Mental illness Other   . Colon cancer Neg Hx   . Esophageal cancer Neg Hx   . Prostate cancer Neg Hx   . Rectal cancer Neg Hx   . Seizures Neg Hx   . Colon polyps Neg Hx     Social History   Social History  . Marital status: Divorced    Spouse name: N/A  . Number of children: 5  . Years of education: 20   Occupational History  .  Retired    Social History Main Topics  . Smoking status: Former Smoker    Quit date: 12/25/1964  . Smokeless tobacco: Never Used     Comment: smoked 6 years   . Alcohol use Yes     Comment: occ with steak dinner   . Drug use: No  . Sexual activity: Not Asked   Other Topics Concern  . 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 [place]: exercising on regular visit; trying to work on diet            Epworth Sleepiness Scale = 20 (as of 02/02/2016)              Current Outpatient Prescriptions:  .  ACCU-CHEK FASTCLIX LANCETS MISC, Use as directed, Disp: 100 each, Rfl: 1 .  ACCU-CHEK SMARTVIEW test strip, USE AS INSTRUCTED TO CHECK BLOOD SUGAR TWICE A DAY, Disp: 100 each, Rfl: 3 .  acetaminophen (TYLENOL) 500 MG tablet, Take 1,000 mg by mouth every 8 (eight) hours as needed., Disp: , Rfl:  .  allopurinol (ZYLOPRIM) 300 MG tablet, TAKE 1 TABLET BY MOUTH ONCE DAILY, Disp: 30 tablet, Rfl: 1 .  amLODipine (NORVASC) 5 MG tablet, TAKE 1 TABLET BY MOUTH DAILY, Disp: 90 tablet, Rfl: 1 .  aspirin 81 MG tablet, Take 81 mg by mouth daily., Disp: , Rfl:  .  Blood Glucose Calibration (ACCU-CHEK AVIVA) SOLN, Use as directed., Disp: 1 each, Rfl: 1 .  calcium-vitamin D (OSCAL 500/200 D-3) 500-200 MG-UNIT per tablet, Take 1 tablet by mouth daily., Disp: , Rfl:  .  cloNIDine (CATAPRES) 0.1 MG tablet, Take 1 tablet (0.1 mg total) by mouth 2 (two) times daily., Disp: 180 tablet, Rfl: 1 .  ferrous sulfate 325 (65 FE) MG tablet, Take 325 mg by mouth daily with breakfast., Disp: , Rfl:  .  glucose blood (ACCU-CHEK AVIVA PLUS) test strip, Use as instructed to check blood sugar twice a day, Disp: 100 each, Rfl: 5 .  glucose blood (ACCU-CHEK SMARTVIEW) test strip, Use as instructed to check blood sugar twice a day, Disp: 100 each, Rfl: 1 .  HUMALOG MIX 75/25 KWIKPEN (75-25) 100  UNIT/ML Kwikpen, INJECT 10 UNITS INTO THE SKIN 2 (TWO) TIMES DAILY BEFORE A MEAL., Disp: 15 pen, Rfl: 4 .  HUMALOG MIX 75/25 KWIKPEN (75-25) 100 UNIT/ML Kwikpen, INJECT 10 UNITS INTO THE SKIN 2 (TWO) TIMES DAILY BEFORE A MEAL., Disp: 15 mL, Rfl: 3 .  hydrALAZINE (APRESOLINE) 25  MG tablet, TAKE 1 TABLET (25 MG TOTAL) BY MOUTH 2 (TWO) TIMES DAILY., Disp: 180 tablet, Rfl: 1 .  Insulin Pen Needle (B-D UF III MINI PEN NEEDLES) 31G X 5 MM MISC, USE TO INJECT INSULIN TWICE A DAY, Disp: 100 each, Rfl: 4 .  isosorbide dinitrate (ISORDIL) 20 MG tablet, TAKE 1 TABLET (20 MG TOTAL) BY MOUTH 2 (TWO) TIMES DAILY., Disp: 60 tablet, Rfl: 5 .  isosorbide dinitrate (ISORDIL) 20 MG tablet, TAKE 1 TABLET (20 MG TOTAL) BY MOUTH 2 (TWO) TIMES DAILY., Disp: 180 tablet, Rfl: 1 .  Lancet Devices (ACCU-CHEK SOFTCLIX) lancets, Use as instructed twice a day, Disp: 1 each, Rfl: 5 .  levothyroxine (SYNTHROID, LEVOTHROID) 137 MCG tablet, TAKE ONE TABLET BY MOUTH ONCE DAILY BEFORE BREAKFAST, Disp: 90 tablet, Rfl: 1 .  losartan (COZAAR) 50 MG tablet, TAKE ONE TABLET BY MOUTH ONCE DAILY, Disp: 90 tablet, Rfl: 1 .  metFORMIN (GLUCOPHAGE) 1000 MG tablet, TAKE ONE TABLET BY MOUTH TWICE DAILY WITH MEALS, Disp: 180 tablet, Rfl: 1 .  Sennosides-Docusate Sodium (STOOL SOFTENER & LAXATIVE PO), Take 1 tablet by mouth daily as needed (constipation). , Disp: , Rfl:  .  simvastatin (ZOCOR) 20 MG tablet, TAKE 1 TABLET BY MOUTH EVERY DAY, Disp: 90 tablet, Rfl: 1 .  vitamin B-12 (CYANOCOBALAMIN) 1000 MCG tablet, Take 1,000 mcg by mouth daily., Disp: , Rfl:   Current Facility-Administered Medications:  .  ipratropium-albuterol (DUONEB) 0.5-2.5 (3) MG/3ML nebulizer solution 3 mL, 3 mL, Nebulization, Once, Delano Metz, FNP  EXAM:  Vitals:   04/16/17 1133  BP: 132/60  Pulse: 81  Temp: 97.5 F (36.4 C)    Body mass index is 45.52 kg/m.  GENERAL: vitals reviewed and listed above, alert, oriented, appears well hydrated and in no acute  distress  HEENT: atraumatic, conjunttiva clear, no obvious abnormalities on inspection of external nose and ears  NECK: no obvious masses on inspection  LUNGS: clear to auscultation bilaterally, no wheezes, rales or rhonchi, good air movement  CV: HRRR, no peripheral edema  MS: moves all extremities without noticeable abnormality  PSYCH: pleasant and cooperative, no obvious depression or anxiety  FOOT EXAM: done  ASSESSMENT AND PLAN:  Discussed the following assessment and plan:  Essential hypertension, benign - Plan: Basic metabolic panel, CBC  Hyperlipidemia, unspecified hyperlipidemia type  Hypothyroidism, unspecified type - Plan: TSH  Iron deficiency anemia, unspecified iron deficiency anemia type  Morbid obesity ([place])  Type 2 diabetes mellitus with other circulatory complication, with long-term current use of insulin ([place]) - Plan: Hemoglobin A1c  -labs -start allegra, refilled alb for prn use -lifestyle recs -advised colon ca screening and eye exam - she agrees to call -foot exam done -Patient advised to return or notify a doctor immediately if symptoms worsen or persist or new concerns arise.  Patient Instructions  BEFORE YOU LEAVE: -follow up: 3 months -labs  Claritin daily  Albuterol as needed  Please call to schedule your colonoscopy  We have ordered labs or studies at this visit. It can take up to 1-2 weeks for results and processing. IF results require follow up or explanation, we will call you with instructions. Clinically stable results will be released to your [facility]. If you have not heard from [place] or cannot find your results in [facility] in 2 weeks please contact our office at [phone].  If you are not yet signed up for [facility], please consider signing up.   We recommend the following healthy lifestyle for LIFE: 1) Small portions.  Tip: eat off of a salad plate instead of a dinner plate.  Tip: It is ok to feel hungry after a meal - that  likely means you ate an appropriate portion.  Tip: if you need more or a snack choose fruits, veggies and/or a handful of nuts or seeds.  2) Eat a healthy clean diet.  * Tip: Avoid (less then 1 serving per week): processed foods, sweets, sweetened drinks, white starches (rice, flour, bread, potatoes, pasta, etc), red meat, fast foods, butter  *Tip: CHOOSE instead   * 5-9 servings per day of fresh or frozen fruits and vegetables (but not corn, potatoes, bananas, canned or dried fruit)   *nuts and seeds, beans   *olives and olive oil   *small portions of lean meats such as fish and white chicken    *small portions of whole grains  3)Get at least 150 minutes of sweaty aerobic exercise per week.  4)Reduce stress - consider counseling, meditation and relaxation to balance other aspects of your life.          Colin Benton R., DO

## 2017-04-19 ENCOUNTER — Telehealth: Payer: Self-pay | Admitting: Family Medicine

## 2017-04-19 MED ORDER — LEVOTHYROXINE SODIUM 125 MCG PO TABS: 125.0000 ug | ORAL_TABLET | Freq: Every day | ORAL | 1 refills | Status: DC

## 2017-04-19 NOTE — Telephone Encounter (Signed)
See results note. 

## 2017-04-19 NOTE — Telephone Encounter (Signed)
Pt returned your call.  

## 2017-04-19 NOTE — Addendum Note (Signed)
Addended by: Agnes Lawrence on: 04/19/2017 11:55 AM   Modules accepted: Orders

## 2017-04-19 NOTE — Addendum Note (Signed)
Addended by: Agnes Lawrence on: 04/19/2017 11:56 AM   Modules accepted: Orders

## 2017-04-20 ENCOUNTER — Other Ambulatory Visit: Payer: Self-pay | Admitting: Family Medicine

## 2017-04-22 ENCOUNTER — Encounter (HOSPITAL_COMMUNITY): Payer: Self-pay | Admitting: Emergency Medicine

## 2017-04-22 ENCOUNTER — Emergency Department (HOSPITAL_COMMUNITY)
Admission: EM | Admit: 2017-04-22 | Discharge: 2017-04-22 | Disposition: A | Discharge status: Home / Self Care | Payer: Medicare Other | Attending: Emergency Medicine | Admitting: Emergency Medicine

## 2017-04-22 ENCOUNTER — Emergency Department (HOSPITAL_COMMUNITY): Payer: Medicare Other

## 2017-04-22 ENCOUNTER — Encounter (HOSPITAL_COMMUNITY): Payer: Self-pay

## 2017-04-22 ENCOUNTER — Ambulatory Visit (HOSPITAL_COMMUNITY)
Admission: EM | Admit: 2017-04-22 | Discharge: 2017-04-22 | Disposition: A | Discharge status: Home / Self Care | Payer: Medicare Other | Attending: Internal Medicine | Admitting: Internal Medicine

## 2017-04-22 DIAGNOSIS — Z7982 Long term (current) use of aspirin: Secondary | ICD-10-CM | POA: Insufficient documentation

## 2017-04-22 DIAGNOSIS — I10 Essential (primary) hypertension: Secondary | ICD-10-CM | POA: Diagnosis not present

## 2017-04-22 DIAGNOSIS — H5712 Ocular pain, left eye: Secondary | ICD-10-CM | POA: Diagnosis present

## 2017-04-22 DIAGNOSIS — H11422 Conjunctival edema, left eye: Secondary | ICD-10-CM

## 2017-04-22 DIAGNOSIS — Z8541 Personal history of malignant neoplasm of cervix uteri: Secondary | ICD-10-CM | POA: Insufficient documentation

## 2017-04-22 DIAGNOSIS — Z794 Long term (current) use of insulin: Secondary | ICD-10-CM | POA: Insufficient documentation

## 2017-04-22 DIAGNOSIS — J0140 Acute pansinusitis, unspecified: Secondary | ICD-10-CM | POA: Diagnosis not present

## 2017-04-22 DIAGNOSIS — Z87891 Personal history of nicotine dependence: Secondary | ICD-10-CM | POA: Insufficient documentation

## 2017-04-22 DIAGNOSIS — E1159 Type 2 diabetes mellitus with other circulatory complications: Secondary | ICD-10-CM | POA: Diagnosis not present

## 2017-04-22 DIAGNOSIS — E039 Hypothyroidism, unspecified: Secondary | ICD-10-CM | POA: Insufficient documentation

## 2017-04-22 LAB — URINALYSIS, ROUTINE W REFLEX MICROSCOPIC
BACTERIA UA: NONE SEEN
Bilirubin Urine: NEGATIVE
Glucose, UA: NEGATIVE mg/dL
Hgb urine dipstick: NEGATIVE
Ketones, ur: NEGATIVE mg/dL
Leukocytes, UA: NEGATIVE
Nitrite: NEGATIVE
PH: 5 (ref 5.0–8.0)
Protein, ur: 100 mg/dL — AB
SPECIFIC GRAVITY, URINE: 1.008 (ref 1.005–1.030)

## 2017-04-22 LAB — COMPREHENSIVE METABOLIC PANEL
ALK PHOS: 71 U/L (ref 38–126)
ALT: 23 U/L (ref 14–54)
AST: 26 U/L (ref 15–41)
Albumin: 3.6 g/dL (ref 3.5–5.0)
Anion gap: 9 (ref 5–15)
BILIRUBIN TOTAL: 0.3 mg/dL (ref 0.3–1.2)
BUN: 12 mg/dL (ref 6–20)
CALCIUM: 8.6 mg/dL — AB (ref 8.9–10.3)
CO2: 24 mmol/L (ref 22–32)
CREATININE: 0.75 mg/dL (ref 0.44–1.00)
Chloride: 103 mmol/L (ref 101–111)
Glucose, Bld: 88 mg/dL (ref 65–99)
Potassium: 3.3 mmol/L — ABNORMAL LOW (ref 3.5–5.1)
SODIUM: 136 mmol/L (ref 135–145)
Total Protein: 7 g/dL (ref 6.5–8.1)

## 2017-04-22 LAB — CBC WITH DIFFERENTIAL/PLATELET
Basophils Absolute: 0 10*3/uL (ref 0.0–0.1)
Basophils Relative: 0 %
EOS PCT: 4 %
Eosinophils Absolute: 0.2 10*3/uL (ref 0.0–0.7)
HCT: 38.8 % (ref 36.0–46.0)
HEMOGLOBIN: 12.5 g/dL (ref 12.0–15.0)
LYMPHS ABS: 1.6 10*3/uL (ref 0.7–4.0)
LYMPHS PCT: 34 %
MCH: 27.2 pg (ref 26.0–34.0)
MCHC: 32.2 g/dL (ref 30.0–36.0)
MCV: 84.5 fL (ref 78.0–100.0)
Monocytes Absolute: 0.4 10*3/uL (ref 0.1–1.0)
Monocytes Relative: 9 %
NEUTROS PCT: 53 %
Neutro Abs: 2.4 10*3/uL (ref 1.7–7.7)
Platelets: 267 10*3/uL (ref 150–400)
RBC: 4.59 MIL/uL (ref 3.87–5.11)
RDW: 14.4 % (ref 11.5–15.5)
WBC: 4.6 10*3/uL (ref 4.0–10.5)

## 2017-04-22 LAB — C-REACTIVE PROTEIN: CRP: 1 mg/dL — ABNORMAL HIGH (ref <1.0)

## 2017-04-22 LAB — I-STAT CHEM 8, ED
BUN: 14 mg/dL (ref 6–20)
CALCIUM ION: 1.13 mmol/L — AB (ref 1.15–1.40)
CHLORIDE: 106 mmol/L (ref 101–111)
CREATININE: 0.7 mg/dL (ref 0.44–1.00)
GLUCOSE: 83 mg/dL (ref 65–99)
HCT: 35 % — ABNORMAL LOW (ref 36.0–46.0)
Hemoglobin: 11.9 g/dL — ABNORMAL LOW (ref 12.0–15.0)
Potassium: 3.3 mmol/L — ABNORMAL LOW (ref 3.5–5.1)
Sodium: 141 mmol/L (ref 135–145)
TCO2: 25 mmol/L (ref 0–100)

## 2017-04-22 LAB — I-STAT CG4 LACTIC ACID, ED: Lactic Acid, Venous: 0.92 mmol/L (ref 0.5–1.9)

## 2017-04-22 LAB — SEDIMENTATION RATE: Sed Rate: 46 mm/hr — ABNORMAL HIGH (ref 0–22)

## 2017-04-22 MED ORDER — IOPAMIDOL (ISOVUE-300) INJECTION 61%
INTRAVENOUS | Status: AC
  Filled 2017-04-22: qty 75

## 2017-04-22 MED ORDER — IOPAMIDOL (ISOVUE-300) INJECTION 61%
INTRAVENOUS | Status: AC
  Administered 2017-04-22: 75 mL
  Filled 2017-04-22: qty 75

## 2017-04-22 MED ORDER — SODIUM CHLORIDE 0.9 % IV SOLN
INTRAVENOUS | Status: DC
  Administered 2017-04-22: 19:00:00 via INTRAVENOUS

## 2017-04-22 MED ORDER — TETRACAINE HCL 0.5 % OP SOLN
OPHTHALMIC | Status: AC
  Filled 2017-04-22: qty 2

## 2017-04-22 MED ORDER — DEXTROSE 5 % IV SOLN
2.0000 g | Freq: Once | INTRAVENOUS | Status: AC
  Administered 2017-04-22: 2 g via INTRAVENOUS
  Filled 2017-04-22: qty 2

## 2017-04-22 MED ORDER — ISOSORBIDE DINITRATE 20 MG PO TABS
20.0000 mg | ORAL_TABLET | Freq: Two times a day (BID) | ORAL | Status: DC
  Filled 2017-04-22: qty 1

## 2017-04-22 MED ORDER — ERYTHROMYCIN 5 MG/GM OP OINT
1.0000 "application " | TOPICAL_OINTMENT | Freq: Once | OPHTHALMIC | Status: AC
  Administered 2017-04-22: 1 via OPHTHALMIC
  Filled 2017-04-22: qty 3.5

## 2017-04-22 MED ORDER — FLUORESCEIN SODIUM 0.6 MG OP STRP
ORAL_STRIP | OPHTHALMIC | Status: AC
  Filled 2017-04-22: qty 1

## 2017-04-22 MED ORDER — AMOXICILLIN-POT CLAVULANATE 875-125 MG PO TABS: 1.0000 | ORAL_TABLET | Freq: Two times a day (BID) | ORAL | 0 refills | Status: DC

## 2017-04-22 MED ORDER — CLONIDINE HCL 0.1 MG PO TABS: 0.1000 mg | ORAL_TABLET | Freq: Two times a day (BID) | ORAL | Status: DC

## 2017-04-22 NOTE — ED Provider Notes (Signed)
[facility] DEPT Provider Note   CSN: [id number] Arrival date & time: 04/22/17  1655     History   Chief Complaint Chief Complaint  Patient presents with  . Orbital cellulitis/needs CT per MD    HPI Katie Mcintyre is a 75 y.o. female.  She presents for evaluation of possible left eye infection.  He was seen earlier today at an urgent care center, sent to ophthalmology, who evaluated her and sent her here for further evaluation.  According to notes from the ophthalmologist, differential diagnosis includes viral conjunctivitis, preseptal or orbital cellulitis, orbital pseudotumor.  He recommended a CT of the orbit.  Patient reports onset of symptoms yesterday, with left eye discomfort, with worsening pain and inability to see, today.  She is having trouble opening her eyelids.  Visual acuity testing by ophthalmology left eye 20/400 at distance, 20/70 at near.  Patient denies fever, chills, nausea, vomiting, chest pain or shortness of breath.  No known sick exposure.  No known prior similar problem.  There are no other known modifying factors.  HPI  Past Medical History:  Diagnosis Date  . Anemia   . Arthritis   . Cancer ([place])    cervical  . Cataract    removed both eyes with lens implant both   . Chicken pox   . Chronic bronchitis ([place])   . Colon polyp   . Depression 06/30/2013  . Diabetes mellitus without complication ([place])   . Diverticulitis   . Frequent headaches   . GERD (gastroesophageal reflux disease)   . Gout 06/30/2013  . Hyperlipidemia   . Hypertension   . S/P epidural steroid injection 09/27/2016   done in back at [facility]   . Spinal stenosis of lumbar region    s/p spine surgery 02/12/13 with Dr Marcos Eke at [facility]  . Thyroid disease   . Urine incontinence   . Use of cane as ambulatory aid     Patient Active Problem List   Diagnosis Date Noted  . Morbid obesity ([place]) 01/15/2017  . Status post placement of implantable loop recorder 08/17/2016  . Syncope  02/02/2016  . Bradycardia 02/02/2016  . DOE (dyspnea on exertion) 02/02/2016  . Pre-syncope 02/02/2016  . History of colonic polyps 01/11/2016  . Type 2 diabetes mellitus with circulatory disorder, with long-term current use of insulin ([place]) 01/11/2016  . Chronic back pain - managed by Dr. Marcos [facility]ke at [facility] 03/12/2014  . Essential hypertension, benign 06/30/2013  . Hyperlipemia 06/30/2013  . Gout 06/30/2013  . Hypothyroidism 06/30/2013  . Iron deficiency anemia 06/30/2013    Past Surgical History:  Procedure Laterality Date  . ABDOMINAL HYSTERECTOMY  1970   complete hysterectomy for cervical cancer  . BACK SURGERY  01/2013  . BACK SURGERY  1995  . CATARACT EXTRACTION, BILATERAL     with lens implant   . CERVICAL CONE BIOPSY    . COLONOSCOPY    . EP IMPLANTABLE DEVICE N/A 02/15/2016   Procedure: Loop Recorder Insertion;  Surgeon: Sanda Klein, MD;  Location: [facility];  Service: Cardiovascular;  Laterality: N/A;  . POLYPECTOMY    . WRIST SURGERY  2010   for wrist fracture    OB History    No data available       Home Medications    Prior to Admission medications   Medication Sig Start Date End Date Taking? Authorizing Provider  ACCU-CHEK FASTCLIX LANCETS MISC Use as directed 03/01/15   [name], DO  ACCU-CHEK SMARTVIEW  test strip USE AS INSTRUCTED TO CHECK BLOOD SUGAR TWICE A DAY 10/03/16   [name], DO  acetaminophen (TYLENOL) 500 MG tablet Take 1,000 mg by mouth every 8 (eight) hours as needed.    Historical Provider, MD  amLODipine (NORVASC) 5 MG tablet TAKE 1 TABLET BY MOUTH DAILY 11/20/16   [name], DO  amoxicillin-clavulanate (AUGMENTIN) 875-125 MG tablet Take 1 tablet by mouth every 12 (twelve) hours. 04/22/17   Daleen Bo, MD  aspirin 81 MG tablet Take 81 mg by mouth daily.    Historical Provider, MD  Blood Glucose Calibration (ACCU-CHEK AVIVA) SOLN Use as directed. 01/12/14   [name], DO  calcium-vitamin D (OSCAL 500/200 D-3) 500-200  MG-UNIT per tablet Take 1 tablet by mouth daily.    Historical Provider, MD  cloNIDine (CATAPRES) 0.1 MG tablet TAKE 1 TABLET BY MOUTH TWICE A DAY 04/20/17   [name], DO  ferrous sulfate 325 (65 FE) MG tablet Take 325 mg by mouth daily with breakfast.    Historical Provider, MD  glucose blood (ACCU-CHEK AVIVA PLUS) test strip Use as instructed to check blood sugar twice a day 10/17/16   [name], DO  glucose blood (ACCU-CHEK SMARTVIEW) test strip Use as instructed to check blood sugar twice a day 03/01/15   [name], DO  HUMALOG MIX 75/25 KWIKPEN (75-25) 100 UNIT/ML Kwikpen INJECT 10 UNITS INTO THE SKIN 2 (TWO) TIMES DAILY BEFORE A MEAL. 12/04/16   [name], DO  HUMALOG MIX 75/25 KWIKPEN (75-25) 100 UNIT/ML Kwikpen INJECT 10 UNITS INTO THE SKIN 2 (TWO) TIMES DAILY BEFORE A MEAL. 12/14/16   [name], DO  hydrALAZINE (APRESOLINE) 25 MG tablet TAKE 1 TABLET (25 MG TOTAL) BY MOUTH 2 (TWO) TIMES DAILY. 03/19/17   [name], DO  Insulin Pen Needle (B-D UF III MINI PEN NEEDLES) 31G X 5 MM MISC USE TO INJECT INSULIN TWICE A DAY 06/16/16   [name], DO  isosorbide dinitrate (ISORDIL) 20 MG tablet TAKE 1 TABLET (20 MG TOTAL) BY MOUTH 2 (TWO) TIMES DAILY. 10/12/16   [name], DO  isosorbide dinitrate (ISORDIL) 20 MG tablet TAKE 1 TABLET (20 MG TOTAL) BY MOUTH 2 (TWO) TIMES DAILY. 03/19/17   [name], DO  Lancet Devices [facility]) lancets Use as instructed twice a day 10/17/16   [name], DO  levothyroxine (SYNTHROID, LEVOTHROID) 125 MCG tablet Take 1 tablet (125 mcg total) by mouth daily. 04/19/17   [name], DO  losartan (COZAAR) 50 MG tablet TAKE ONE TABLET BY MOUTH ONCE DAILY 11/13/16   [name], DO  metFORMIN (GLUCOPHAGE) 1000 MG tablet TAKE ONE TABLET BY MOUTH TWICE DAILY WITH MEALS 04/24/17   [name], DO  Sennosides-Docusate Sodium (STOOL SOFTENER & LAXATIVE PO) Take 1 tablet by mouth daily as needed (constipation).     Historical Provider, MD    simvastatin (ZOCOR) 20 MG tablet TAKE 1 TABLET BY MOUTH EVERY DAY 12/14/16   [name], DO  vitamin B-12 (CYANOCOBALAMIN) 1000 MCG tablet Take 1,000 mcg by mouth daily.    Historical Provider, MD    Family History Family History  Problem Relation Age of Onset  . Alcohol abuse Father   . Cirrhosis Father   . Heart attack Brother   . Breast cancer Sister   . Cancer Mother     renal cancer  . Stroke Paternal Grandmother   . Alcohol abuse  Brother   . Cancer Sister     renal cancer  . Diabetes Brother   . Heart Problems Daughter   . Healthy Daughter   . Healthy Daughter   . Healthy Son   . Healthy Son   . Arthritis      parents  . Hyperlipidemia      parent  . Hypertension      parent/grandparent  . Diabetes      parent/grandparent  . Heart disease Other   . Mental illness Other   . Colon cancer Neg Hx   . Esophageal cancer Neg Hx   . Prostate cancer Neg Hx   . Rectal cancer Neg Hx   . Seizures Neg Hx   . Colon polyps Neg Hx     Social History Social History  Substance Use Topics  . Smoking status: Former Smoker    Quit date: 12/25/1964  . Smokeless tobacco: Never Used     Comment: smoked 6 years   . Alcohol use Yes     Comment: occ with steak dinner      Allergies   Advil [ibuprofen] and Lyrica [pregabalin]   Review of Systems Review of Systems  All other systems reviewed and are negative.    Physical Exam Updated Vital Signs BP (!) 150/53   Pulse 88   Temp 98.1 F (36.7 C)   Resp 15   SpO2 97%   Physical Exam  Constitutional: She is oriented to person, place, and time. She appears well-developed and well-nourished.  HENT:  Head: Normocephalic and atraumatic.  Eyes: EOM are normal. Pupils are equal, round, and reactive to light.  Left eye with marked chemosis, sparing the central corneal region.  Moderate swelling upper eyelid and mild swelling lower eyelid, with pain on palpation of the upper eyelid.  External ocular muscles are intact.  No  facial cellulitis or crepitation.  No trismus.  Neck: Normal range of motion and phonation normal. Neck supple.  Cardiovascular: Normal rate and regular rhythm.   Pulmonary/Chest: Effort normal and breath sounds normal. She exhibits no tenderness.  Abdominal: Soft. She exhibits no distension. There is no tenderness. There is no guarding.  Musculoskeletal: Normal range of motion.  Neurological: She is alert and oriented to person, place, and time. She exhibits normal muscle tone.  Skin: Skin is warm and dry.  Psychiatric: She has a normal mood and affect. Her behavior is normal. Judgment and thought content normal.  Nursing note and vitals reviewed.    ED Treatments / Results  Labs (all labs ordered are listed, but only abnormal results are displayed) Labs Reviewed  C-REACTIVE PROTEIN - Abnormal; Notable for the following:       Result Value   CRP 1.0 (*)    All other components within normal limits  COMPREHENSIVE METABOLIC PANEL - Abnormal; Notable for the following:    Potassium 3.3 (*)    Calcium 8.6 (*)    All other components within normal limits  URINALYSIS, ROUTINE W REFLEX MICROSCOPIC - Abnormal; Notable for the following:    Color, Urine STRAW (*)    Protein, ur 100 (*)    Squamous Epithelial / LPF 0-5 (*)    All other components within normal limits  SEDIMENTATION RATE - Abnormal; Notable for the following:    Sed Rate 46 (*)    All other components within normal limits  I-STAT CHEM 8, ED - Abnormal; Notable for the following:    Potassium 3.3 (*)  Calcium, Ion 1.13 (*)    Hemoglobin 11.9 (*)    HCT 35.0 (*)    All other components within normal limits  CULTURE, BLOOD (ROUTINE X 2)  CULTURE, BLOOD (ROUTINE X 2)  CBC WITH DIFFERENTIAL/PLATELET  CBC WITH DIFFERENTIAL/PLATELET  I-STAT CG4 LACTIC ACID, ED    EKG  EKG Interpretation None       Radiology No results found.  Procedures Procedures (including critical care time)  Medications Ordered in  ED Medications  cefTRIAXone (ROCEPHIN) 2 g in dextrose 5 % 50 mL IVPB (0 g Intravenous Stopped 04/22/17 1950)  iopamidol (ISOVUE-300) 61 % injection (75 mLs  Contrast Given 04/22/17 2009)  erythromycin ophthalmic ointment 1 application (1 application Left Eye Given 04/22/17 2120)     Initial Impression / Assessment and Plan / ED Course  I have reviewed the triage vital signs and the nursing notes.  Pertinent labs & imaging results that were available during my care of the patient were reviewed by me and considered in my medical decision making (see chart for details).  Clinical Course as of Apr 27 799  Sun Apr 22, 2017  2109 Case discussed with Dr. Jola Schmidt, ophthalmologist, who saw her earlier today.  He agrees with the treatment plan and recommends addition of erythromycin ointment, and request that she follow-up tomorrow afternoon for an appointment with him, she will need to call and schedule the appointment.  [EW]    Clinical Course User Index [EW] Daleen Bo, MD    Medications  cefTRIAXone (ROCEPHIN) 2 g in dextrose 5 % 50 mL IVPB (0 g Intravenous Stopped 04/22/17 1950)  iopamidol (ISOVUE-300) 61 % injection (75 mLs  Contrast Given 04/22/17 2009)  erythromycin ophthalmic ointment 1 application (1 application Left Eye Given 04/22/17 2120)    No data found.  Findings discussed with ophthalmology who saw her prior to arrival here.  He asked that the patient follow-up with him tomorrow morning, at 8:30 AM.  At discharge- reevaluation with update and discussion. After initial assessment and treatment, an updated evaluation reveals no change in clinical status.  Patient remains comfortable.  Findings discussed with the patient and all questions were answered. Rolla Kedzierski L    Final Clinical Impressions(s) / ED Diagnoses   Final diagnoses:  Acute pansinusitis, recurrence not specified  Chemosis of conjunctiva, left   Left thigh swelling, likely secondary to left paranasal  sinusitis.  Doubt metabolic instability, impending vision loss or sepsis.  Nursing Notes Reviewed/ Care Coordinated Applicable Imaging Reviewed Interpretation of Laboratory Data incorporated into ED treatment  The patient appears reasonably screened and/or stabilized for discharge and I doubt any other medical condition or other [facility] requiring further screening, evaluation, or treatment in the ED at this time prior to discharge.  Plan: Home Medications-continue usual; Home Treatments-warm compress as needed; return here if the recommended treatment, does not improve the symptoms; Recommended follow up-ophthalmology day following ED visit, PCP, as needed    New Prescriptions Discharge Medication List as of 04/22/2017  9:21 PM    START taking these medications   Details  amoxicillin-clavulanate (AUGMENTIN) 875-125 MG tablet Take 1 tablet by mouth every 12 (twelve) hours., Starting Sun 04/22/2017, Print         Daleen Bo, MD 04/26/17 [id number]

## 2017-04-22 NOTE — Discharge Instructions (Signed)
I have spoken to the on-call ophthalmologist, and per Dr. Romeo Apple directions you are to immediately leave our office, and go to his office at [place], you are to [place] at the side of the building, and then dial the phone number [phone], and leave a message. There will not be any body at the building to greet you upon your arrival, but once you called and leave a message the doctor will be on his way and should be there within 30 minutes of your arrival.

## 2017-04-22 NOTE — ED Triage Notes (Signed)
Patient sent from opthalmology for CT for possible orbital cellulitis. Here with left eye redness and eyelid swelling. Was also told pressure up in both eyes. Painful to touch only

## 2017-04-22 NOTE — Discharge Instructions (Signed)
The swelling in your left eye appears to be caused from a sinus infection.  Use the erythromycin eye ointment, a 1 inch ribbon, 3 times a day to help the heal.  Additional treatments include Augmentin twice a day for bacterial sinusitis.  Also, use Flonase nasal spray 1 in each nostril twice a day for 2 weeks.  Additionally use Afrin nasal spray 1 in each nostril twice a day for congestion, while taking Flonase.  To help the swelling and discomfort in your face and eye, use a warm moist compress on the area 3 or 4 times a day for 30 minutes.

## 2017-04-22 NOTE — ED Provider Notes (Signed)
CSN: [id number]     Arrival date & time 04/22/17  1250 History   First MD Initiated Contact with Patient 04/22/17 1422     Chief Complaint  Patient presents with  . Eye Pain   (Consider location/radiation/quality/duration/timing/severity/associated sxs/prior Treatment) 76 year old female presents to clinic with a 24 hour history of swelling and pain in the left eye. She reports the swelling has gradually been getting worse, and that she can't see out of the eye. Upon further clarification, she reports she can see movement but things "look fuzzy" She denies sensation of foreign body, history of injury, glaucoma. She does not wear contacts or glasses, had cataracts removed in 2013 and has "implants" She has no light sensitivity, headaches, dizziness, fever, or other systemic symptoms.   The history is provided by the patient.  Eye Problem  Location:  Left eye Quality:  Sharp and aching Severity:  Severe Onset quality:  Gradual Duration:  1 day Timing:  Constant Progression:  Worsening Chronicity:  New Context: not burn, not chemical exposure, not contact lens problem, not direct trauma, not foreign body, not scratch, not smoke exposure and not UV exposure   Relieved by:  Nothing Worsened by:  Nothing Ineffective treatments:  None tried Associated symptoms: decreased vision, discharge, redness, swelling and tearing   Associated symptoms: no itching and no photophobia   Risk factors: no conjunctival hemorrhage and no recent URI     Past Medical History:  Diagnosis Date  . Anemia   . Arthritis   . Cancer ([place])    cervical  . Cataract    removed both eyes with lens implant both   . Chicken pox   . Chronic bronchitis ([place])   . Colon polyp   . Depression 06/30/2013  . Diabetes mellitus without complication ([place])   . Diverticulitis   . Frequent headaches   . GERD (gastroesophageal reflux disease)   . Gout 06/30/2013  . Hyperlipidemia   . Hypertension   . S/P epidural steroid  injection 09/27/2016   done in back at [facility]   . Spinal stenosis of lumbar region    s/p spine surgery 02/12/13 with Dr Marcos Eke at [facility]  . Thyroid disease   . Urine incontinence   . Use of cane as ambulatory aid    Past Surgical History:  Procedure Laterality Date  . ABDOMINAL HYSTERECTOMY  1970   complete hysterectomy for cervical cancer  . BACK SURGERY  01/2013  . BACK SURGERY  1995  . CATARACT EXTRACTION, BILATERAL     with lens implant   . CERVICAL CONE BIOPSY    . COLONOSCOPY    . EP IMPLANTABLE DEVICE N/A 02/15/2016   Procedure: Loop Recorder Insertion;  Surgeon: Sanda Klein, MD;  Location: [facility];  Service: Cardiovascular;  Laterality: N/A;  . POLYPECTOMY    . WRIST SURGERY  2010   for wrist fracture   Family History  Problem Relation Age of Onset  . Alcohol abuse Father   . Cirrhosis Father   . Heart attack Brother   . Breast cancer Sister   . Cancer Mother     renal cancer  . Stroke Paternal Grandmother   . Alcohol abuse Brother   . Cancer Sister     renal cancer  . Diabetes Brother   . Heart Problems Daughter   . Healthy Daughter   . Healthy Daughter   . Healthy Son   . Healthy Son   . Arthritis  parents  . Hyperlipidemia      parent  . Hypertension      parent/grandparent  . Diabetes      parent/grandparent  . Heart disease Other   . Mental illness Other   . Colon cancer Neg Hx   . Esophageal cancer Neg Hx   . Prostate cancer Neg Hx   . Rectal cancer Neg Hx   . Seizures Neg Hx   . Colon polyps Neg Hx    Social History  Substance Use Topics  . Smoking status: Former Smoker    Quit date: 12/25/1964  . Smokeless tobacco: Never Used     Comment: smoked 6 years   . Alcohol use Yes     Comment: occ with steak dinner    OB History    No data available     Review of Systems  Constitutional: Negative.   HENT: Negative.   Eyes: Positive for pain, discharge, redness and visual disturbance. Negative for photophobia and itching.    Respiratory: Negative.   Cardiovascular: Negative.   Gastrointestinal: Negative.   Musculoskeletal: Negative.   Skin: Negative.   Neurological: Negative.     Allergies  Advil [ibuprofen] and Lyrica [pregabalin]  Home Medications   Prior to Admission medications   Medication Sig Start Date End Date Taking? Authorizing Provider  ACCU-CHEK FASTCLIX LANCETS MISC Use as directed 03/01/15   [name], DO  ACCU-CHEK SMARTVIEW test strip USE AS INSTRUCTED TO CHECK BLOOD SUGAR TWICE A DAY 10/03/16   [name], DO  acetaminophen (TYLENOL) 500 MG tablet Take 1,000 mg by mouth every 8 (eight) hours as needed.    Historical Provider, MD  amLODipine (NORVASC) 5 MG tablet TAKE 1 TABLET BY MOUTH DAILY 11/20/16   [name], DO  amoxicillin-clavulanate (AUGMENTIN) 875-125 MG tablet Take 1 tablet by mouth every 12 (twelve) hours. 04/22/17   Daleen Bo, MD  aspirin 81 MG tablet Take 81 mg by mouth daily.    Historical Provider, MD  Blood Glucose Calibration (ACCU-CHEK AVIVA) SOLN Use as directed. 01/12/14   [name], DO  calcium-vitamin D (OSCAL 500/200 D-3) 500-200 MG-UNIT per tablet Take 1 tablet by mouth daily.    Historical Provider, MD  cloNIDine (CATAPRES) 0.1 MG tablet TAKE 1 TABLET BY MOUTH TWICE A DAY 04/20/17   [name], DO  ferrous sulfate 325 (65 FE) MG tablet Take 325 mg by mouth daily with breakfast.    Historical Provider, MD  glucose blood (ACCU-CHEK AVIVA PLUS) test strip Use as instructed to check blood sugar twice a day 10/17/16   [name], DO  glucose blood (ACCU-CHEK SMARTVIEW) test strip Use as instructed to check blood sugar twice a day 03/01/15   [name], DO  HUMALOG MIX 75/25 KWIKPEN (75-25) 100 UNIT/ML Kwikpen INJECT 10 UNITS INTO THE SKIN 2 (TWO) TIMES DAILY BEFORE A MEAL. 12/04/16   [name], DO  HUMALOG MIX 75/25 KWIKPEN (75-25) 100 UNIT/ML Kwikpen INJECT 10 UNITS INTO THE SKIN 2 (TWO) TIMES DAILY BEFORE A MEAL. 12/14/16   [name], DO   hydrALAZINE (APRESOLINE) 25 MG tablet TAKE 1 TABLET (25 MG TOTAL) BY MOUTH 2 (TWO) TIMES DAILY. 03/19/17   [name], DO  Insulin Pen Needle (B-D UF III MINI PEN NEEDLES) 31G X 5 MM MISC USE TO INJECT INSULIN TWICE A DAY 06/16/16   [name], DO  isosorbide dinitrate (ISORDIL) 20 MG tablet TAKE 1 TABLET (20 MG  TOTAL) BY MOUTH 2 (TWO) TIMES DAILY. 10/12/16   [name], DO  isosorbide dinitrate (ISORDIL) 20 MG tablet TAKE 1 TABLET (20 MG TOTAL) BY MOUTH 2 (TWO) TIMES DAILY. 03/19/17   [name], DO  Lancet Devices [facility]) lancets Use as instructed twice a day 10/17/16   [name], DO  levothyroxine (SYNTHROID, LEVOTHROID) 125 MCG tablet Take 1 tablet (125 mcg total) by mouth daily. 04/19/17   [name], DO  losartan (COZAAR) 50 MG tablet TAKE ONE TABLET BY MOUTH ONCE DAILY 11/13/16   [name], DO  metFORMIN (GLUCOPHAGE) 1000 MG tablet TAKE ONE TABLET BY MOUTH TWICE DAILY WITH MEALS 11/20/16   [name], DO  Sennosides-Docusate Sodium (STOOL SOFTENER & LAXATIVE PO) Take 1 tablet by mouth daily as needed (constipation).     Historical Provider, MD  simvastatin (ZOCOR) 20 MG tablet TAKE 1 TABLET BY MOUTH EVERY DAY 12/14/16   [name], DO  vitamin B-12 (CYANOCOBALAMIN) 1000 MCG tablet Take 1,000 mcg by mouth daily.    Historical Provider, MD   Meds Ordered and Administered this Visit  Medications - No data to display  BP (!) 185/57 (BP Location: Left Wrist) Comment: notified cma  Pulse 67   Temp 98.1 F (36.7 C) (Oral)   Resp 16   SpO2 96%  No data found.   Physical Exam  Constitutional: She appears well-developed and well-nourished. No distress.  HENT:  Head: Normocephalic and atraumatic.  Right Ear: External ear normal.  Left Ear: External ear normal.  Eyes: Left eye exhibits chemosis and discharge. Left conjunctiva is injected. Left conjunctiva has no hemorrhage. Left pupil is not reactive.  Noted periorbital edema, chemosis, corneal lesions with  fluorescein uptake at the 12:00 and 6:00 positions in the left eye.  Neck: Normal range of motion.  Cardiovascular: Normal rate and regular rhythm.   Pulmonary/Chest: Effort normal and breath sounds normal.  Lymphadenopathy:    She has no cervical adenopathy.  Skin: Skin is warm and dry. She is not diaphoretic.  Psychiatric: She has a normal mood and affect. Her behavior is normal.  Nursing note and vitals reviewed.         Urgent Care Course     Procedures (including critical care time)  Labs Review Labs Reviewed - No data to display  Imaging Review Dg Chest Port 1 View  Result Date: 04/22/2017 CLINICAL DATA:  Rash and a left eye infection EXAM: PORTABLE CHEST 1 VIEW COMPARISON:  03/27/2017 FINDINGS: Cardiac shadow is mildly enlarged. A loop recorder is again identified and stable. Lungs are clear bilaterally. No acute bony abnormality is seen. IMPRESSION: No acute abnormality noted. Electronically Signed   By: Inez Catalina M.D.   On: 04/22/2017 18:10   Ct Orbits W Contrast  Result Date: 04/22/2017 CLINICAL DATA:  Chemosis left eye.  Diabetes. EXAM: CT ORBITS WITH CONTRAST TECHNIQUE: Multidetector CT images was performed according to the standard protocol following intravenous contrast administration. CONTRAST:  58mL ISOVUE-300 IOPAMIDOL (ISOVUE-300) INJECTION 61% COMPARISON:  MRI 05/12/2016 FINDINGS: Orbits: Right orbit is normal. Previous intra-ocular lens implant. On the left, there is periorbital preseptal soft tissue swelling consistent with ordinary inflammatory disease. The globe appears normal, with previous intra-ocular lens implant. Optic nerve is normal. Extra-ocular muscles are normal per orbital fat is normal. This could be spontaneous or could possibly be secondary to dacrocystitis. Visualized sinuses: There is mucosal thickening at the frontal ethmoid junction on the left. Left ethmoid air  cells shows subtotal opacification, anterior more than posterior. Mild mucosal  thickening at the left sphenoid ethmoid junction. Left maxillary sinus shows circumferential mucosal inflammation. Right maxillary sinus is clear. No evidence of deficiency of the orbital for or lamina papyracea region. No intraorbital inflammation attributable specifically to sinus disease. Soft tissues: Otherwise negative. Limited intracranial: Normal IMPRESSION: Left periorbital preseptal soft tissue swelling consistent with ordinary inflammation. Specific etiology indeterminate. Some possibility this could relate to dacryocystitis. No postseptal orbital disease. Extensive left-sided paranasal sinus inflammation, but without evidence of orbital breakthrough. Electronically Signed   By: Nelson Chimes M.D.   On: 04/22/2017 20:43     Visual Acuity Review  Right Eye Distance: 20/70 Left Eye Distance:  ( pt stated unable to see anything) Bilateral Distance: 20/70  Right Eye Near:   Left Eye Near:    Bilateral Near:         MDM   1. Chemosis of left conjunctiva     Consulted with Dr. Valetta Close ophthalmologist on call, instructed patient to immediately leave our clinic and go to his office. Follow-up care and evaluation will be done by Dr. Valetta Close.     Barnet Glasgow, NP 04/22/17 2155

## 2017-04-22 NOTE — ED Notes (Signed)
Delay in ABX due to difficulty obtaining IV access. RN at bedside with ultrasound at this time. EDP made aware.

## 2017-04-22 NOTE — ED Notes (Signed)
Patient transported to CT 

## 2017-04-22 NOTE — ED Triage Notes (Addendum)
The patient presented to the [facility] with a complaint of left eye pain and swelling x 2 days. The patient denied any known injury to the eye.

## 2017-04-22 NOTE — ED Notes (Addendum)
Lab called stating that lavender tube (CBC and Sed rate) are clotted. New tube to be collected.  EDP advising oral BP meds with food for pt. Elevated BP.  EDP advising no fluid bolus at this time.

## 2017-04-22 NOTE — ED Notes (Signed)
RN attempted IV 2x unsuccessfully. Will have another RN attempt.  

## 2017-04-22 NOTE — ED Notes (Signed)
EDP at bedside  

## 2017-04-23 ENCOUNTER — Other Ambulatory Visit: Payer: Self-pay | Admitting: Family Medicine

## 2017-04-27 LAB — CULTURE, BLOOD (ROUTINE X 2)
CULTURE: NO GROWTH
CULTURE: NO GROWTH
Special Requests: ADEQUATE
Special Requests: ADEQUATE

## 2017-04-27 LAB — CUP PACEART REMOTE DEVICE CHECK
Implantable Pulse Generator Implant Date: 20170221
MDC IDC SESS DTM: 20180417234050

## 2017-05-03 ENCOUNTER — Other Ambulatory Visit: Payer: Self-pay | Admitting: Family Medicine

## 2017-05-09 ENCOUNTER — Encounter: Payer: Self-pay | Admitting: Internal Medicine

## 2017-05-10 ENCOUNTER — Ambulatory Visit (INDEPENDENT_AMBULATORY_CARE_PROVIDER_SITE_OTHER): Payer: Medicare Other | Admitting: *Deleted

## 2017-05-10 DIAGNOSIS — R55 Syncope and collapse: Secondary | ICD-10-CM

## 2017-05-11 NOTE — Progress Notes (Signed)
Carelink Summary Report / Loop Recorder 

## 2017-05-19 ENCOUNTER — Other Ambulatory Visit: Payer: Self-pay | Admitting: Family Medicine

## 2017-05-20 LAB — CUP PACEART REMOTE DEVICE CHECK
Date Time Interrogation Session: 20180517234153
Implantable Pulse Generator Implant Date: 20170221

## 2017-05-20 NOTE — Progress Notes (Signed)
Carelink summary report received. Battery status OK. Normal device function. No new symptom episodes, tachy episodes, brady, or pause episodes. No new AF episodes. Monthly summary reports and ROV/PRN 

## 2017-06-01 ENCOUNTER — Ambulatory Visit: Payer: Medicare Other | Admitting: Family Medicine

## 2017-06-06 ENCOUNTER — Ambulatory Visit: Payer: Medicare Other | Admitting: Podiatry

## 2017-06-11 ENCOUNTER — Ambulatory Visit (INDEPENDENT_AMBULATORY_CARE_PROVIDER_SITE_OTHER): Payer: Medicare Other | Admitting: *Deleted

## 2017-06-11 DIAGNOSIS — R55 Syncope and collapse: Secondary | ICD-10-CM

## 2017-06-12 NOTE — Progress Notes (Signed)
Carelink Summary Report / Loop Recorder 

## 2017-06-20 ENCOUNTER — Other Ambulatory Visit: Payer: Self-pay | Admitting: Family Medicine

## 2017-06-20 LAB — CUP PACEART REMOTE DEVICE CHECK
Date Time Interrogation Session: 20180617000907
Implantable Pulse Generator Implant Date: 20170221

## 2017-07-09 ENCOUNTER — Ambulatory Visit (INDEPENDENT_AMBULATORY_CARE_PROVIDER_SITE_OTHER): Payer: Medicare Other | Admitting: *Deleted

## 2017-07-09 DIAGNOSIS — R55 Syncope and collapse: Secondary | ICD-10-CM | POA: Diagnosis not present

## 2017-07-10 NOTE — Progress Notes (Signed)
Carelink Summary Report / Loop Recorder 

## 2017-07-16 ENCOUNTER — Ambulatory Visit: Payer: Medicare Other | Admitting: Family Medicine

## 2017-07-20 ENCOUNTER — Encounter: Payer: Medicare Other | Admitting: Internal Medicine

## 2017-07-23 LAB — CUP PACEART REMOTE DEVICE CHECK
Date Time Interrogation Session: 20180717003933
Implantable Pulse Generator Implant Date: 20170221

## 2017-08-07 ENCOUNTER — Ambulatory Visit: Payer: Medicare Other | Admitting: Family Medicine

## 2017-08-08 ENCOUNTER — Ambulatory Visit (INDEPENDENT_AMBULATORY_CARE_PROVIDER_SITE_OTHER): Payer: Medicare Other | Admitting: *Deleted

## 2017-08-08 DIAGNOSIS — R001 Bradycardia, unspecified: Secondary | ICD-10-CM | POA: Diagnosis not present

## 2017-08-12 ENCOUNTER — Other Ambulatory Visit: Payer: Self-pay | Admitting: Family Medicine

## 2017-08-14 ENCOUNTER — Ambulatory Visit (INDEPENDENT_AMBULATORY_CARE_PROVIDER_SITE_OTHER): Payer: Medicare Other | Admitting: Podiatry

## 2017-08-14 ENCOUNTER — Encounter: Payer: Self-pay | Admitting: Podiatry

## 2017-08-14 DIAGNOSIS — E119 Type 2 diabetes mellitus without complications: Secondary | ICD-10-CM | POA: Diagnosis not present

## 2017-08-14 DIAGNOSIS — M79609 Pain in unspecified limb: Secondary | ICD-10-CM

## 2017-08-14 DIAGNOSIS — B351 Tinea unguium: Secondary | ICD-10-CM

## 2017-08-14 DIAGNOSIS — E1142 Type 2 diabetes mellitus with diabetic polyneuropathy: Secondary | ICD-10-CM

## 2017-08-14 LAB — CUP PACEART REMOTE DEVICE CHECK
Date Time Interrogation Session: 20180816013921
MDC IDC PG IMPLANT DT: 20170221

## 2017-08-14 NOTE — Progress Notes (Signed)
Patient ID: Katie Mcintyre, female   DOB: 10-01-1941, 76 y.o.   MRN: [id number]   This patient presents today complaining of thickened and elongated toenails which are comfortable walking wearing shoes and requests toenail debridement. Patient's last visit for a similar service was on 03/07/2017  Objective: Bilateral peripheral pitting edema DP and PT pulses 2/4 bilaterally Capillary reflex within normal limits bilaterally Sensation to 10 g monofilament wire intact 3/5 right and 5/5 left Vibratory sensation reactive bilaterally Ankle reflexes reactive bilaterally No open skin lesions bilaterally Atrophic skin with absent hair growth bilaterally The toenails are extremely elongated, discolored, deformed, hypertrophic and tender direct palpation 6-10 Patient walks with a cane Hammertoe second bilaterally Manual motor testing dorsi flexion, plantar flexion 5/5 bilaterally  Assessment: Diabetic with a history of peripheral neuropathy Symptomatic mycotic toenails 6-10  Plan: Debridement toenails 6-10 mechanically and electrically without any bleeding  Recommended three-month intervals for repetitive debridement, however, patient prefers to contact office

## 2017-08-14 NOTE — Progress Notes (Signed)
Carelink summary report received. Battery status OK. Normal device function. No new symptom episodes, tachy episodes, brady, or pause episodes. No new AF episodes. Monthly summary reports and ROV/PRN 

## 2017-08-14 NOTE — Patient Instructions (Signed)

## 2017-08-21 ENCOUNTER — Encounter: Payer: Self-pay | Admitting: Family Medicine

## 2017-08-21 ENCOUNTER — Ambulatory Visit (INDEPENDENT_AMBULATORY_CARE_PROVIDER_SITE_OTHER): Payer: Medicare Other | Admitting: Family Medicine

## 2017-08-21 ENCOUNTER — Telehealth: Payer: Self-pay | Admitting: *Deleted

## 2017-08-21 VITALS — BP 132/80 | HR 71 | Temp 97.9°F | Ht 61.0 in | Wt 236.6 lb

## 2017-08-21 DIAGNOSIS — M109 Gout, unspecified: Secondary | ICD-10-CM

## 2017-08-21 DIAGNOSIS — Z23 Encounter for immunization: Secondary | ICD-10-CM

## 2017-08-21 DIAGNOSIS — E039 Hypothyroidism, unspecified: Secondary | ICD-10-CM

## 2017-08-21 DIAGNOSIS — I152 Hypertension secondary to endocrine disorders: Secondary | ICD-10-CM

## 2017-08-21 DIAGNOSIS — E1159 Type 2 diabetes mellitus with other circulatory complications: Secondary | ICD-10-CM

## 2017-08-21 DIAGNOSIS — Z794 Long term (current) use of insulin: Secondary | ICD-10-CM | POA: Diagnosis not present

## 2017-08-21 DIAGNOSIS — F330 Major depressive disorder, recurrent, mild: Secondary | ICD-10-CM | POA: Diagnosis not present

## 2017-08-21 DIAGNOSIS — E1169 Type 2 diabetes mellitus with other specified complication: Secondary | ICD-10-CM

## 2017-08-21 DIAGNOSIS — E785 Hyperlipidemia, unspecified: Secondary | ICD-10-CM | POA: Diagnosis not present

## 2017-08-21 DIAGNOSIS — I10 Essential (primary) hypertension: Secondary | ICD-10-CM

## 2017-08-21 LAB — URIC ACID: Uric Acid, Serum: 7.1 mg/dL — ABNORMAL HIGH (ref 2.4–7.0)

## 2017-08-21 LAB — CBC
HEMATOCRIT: 38.1 % (ref 36.0–46.0)
HEMOGLOBIN: 12.5 g/dL (ref 12.0–15.0)
MCHC: 32.7 g/dL (ref 30.0–36.0)
MCV: 85.6 fl (ref 78.0–100.0)
Platelets: 296 10*3/uL (ref 150.0–400.0)
RBC: 4.45 Mil/uL (ref 3.87–5.11)
RDW: 15 % (ref 11.5–15.5)
WBC: 5.3 10*3/uL (ref 4.0–10.5)

## 2017-08-21 LAB — BASIC METABOLIC PANEL
BUN: 17 mg/dL (ref 6–23)
CHLORIDE: 104 meq/L (ref 96–112)
CO2: 26 meq/L (ref 19–32)
Calcium: 9.6 mg/dL (ref 8.4–10.5)
Creatinine, Ser: 0.72 mg/dL (ref 0.40–1.20)
GFR: 100.91 mL/min (ref >60.00)
Glucose, Bld: 117 mg/dL — ABNORMAL HIGH (ref 70–99)
POTASSIUM: 4.3 meq/L (ref 3.5–5.1)
Sodium: 139 mEq/L (ref 135–145)

## 2017-08-21 LAB — TSH: TSH: 0.95 u[IU]/mL (ref 0.35–4.50)

## 2017-08-21 LAB — HEMOGLOBIN A1C: Hgb A1c MFr Bld: 7.5 % — ABNORMAL HIGH (ref 4.6–6.5)

## 2017-08-21 NOTE — Progress Notes (Signed)
HPI:  Katie Mcintyre is a pleasant 76 y.o. here for follow up. Chronic medical problems summarized below were reviewed for changes. Reports doing well overall. Her blood sugars are in the 100 to 150s range on average fasting. No hypoglycemia. She had some foot pain and saw her podiatrist last week. She wonders if she should restart her allopurinol that she used to take for gout in the past. She reports she stopped this just because she wanted to limit her medications. Denies any gout flares in the last year. Denies any red or swollen or hot joints. She continues to have low back pain and plans to see her specialist at [facility] for this. Reports she did not get her colonoscopy done, but does have the number to call to reschedule. Not exercising currently as she feels like she got a cold last year from going to silver sneakers.She agrees to make this call and set this up. Denies CP, SOB, DOE, treatment intolerance or new symptoms. Due for colon cancer screening, by exam, flu vaccine and labs: BMP, CBC, TSH, hemoglobin A1c. Due for annual wellness visit in November with lipid check then.  DM: -circulatory complications -meds: metformin, asa, losartan, mixed humolog 75/25 13 units bid -switched to mixed insulin 04/2015 -Opthomologist: sees ophthalmology every 4 months per her report -podiatrist is Dr. Amalia Hailey  HLD/Obesity: -meds simvastatin 20 -poor lifestyle  HTN/Chronic LE edema: -meds: isosorbide, hydralazine, clonidine, metoprolol, losartan, norvasc -chronic LE edema intermittently - non-compliant with compression  Hypothyroid:  -on levothyroxine  Anemia:  -on iron therapy from prior PCP Had colonoscopy with polpys, repeat advised in 08/2016   Hx Gout: -on allopurinol from prior PCP  H of Depression: resolved -meds: none currently  Hx Pre-syncope/AMS events: -s/p eval with neurology and seeing cardiology in 2017 with Echo, has implanted loop recorder -on review OSH records  hx abnormal EKG,LAFB in the past per notes  Chronic back pain/lumbar radiculopathy and spinal stenosis: -seeing Dr. Marcos Eke at [facility] for this -walks with cane  ROS: See pertinent positives and negatives per HPI.  Past Medical History:  Diagnosis Date  . Anemia   . Arthritis   . Cancer ([place])    cervical  . Cataract    removed both eyes with lens implant both   . Chicken pox   . Chronic bronchitis ([place])   . Colon polyp   . Depression 06/30/2013  . Diabetes mellitus without complication ([place])   . Diverticulitis   . Frequent headaches   . GERD (gastroesophageal reflux disease)   . Gout 06/30/2013  . Hyperlipidemia   . Hypertension   . S/P epidural steroid injection 09/27/2016   done in back at [facility]   . Spinal stenosis of lumbar region    s/p spine surgery 02/12/13 with Dr Marcos Eke at [facility]  . Thyroid disease   . Urine incontinence   . Use of cane as ambulatory aid     Past Surgical History:  Procedure Laterality Date  . ABDOMINAL HYSTERECTOMY  1970   complete hysterectomy for cervical cancer  . BACK SURGERY  01/2013  . BACK SURGERY  1995  . CATARACT EXTRACTION, BILATERAL     with lens implant   . CERVICAL CONE BIOPSY    . COLONOSCOPY    . EP IMPLANTABLE DEVICE N/A 02/15/2016   Procedure: Loop Recorder Insertion;  Surgeon: Sanda Klein, MD;  Location: [facility];  Service: Cardiovascular;  Laterality: N/A;  . POLYPECTOMY    . WRIST SURGERY  2010  for wrist fracture    Family History  Problem Relation Age of Onset  . Alcohol abuse Father   . Cirrhosis Father   . Heart attack Brother   . Breast cancer Sister   . Cancer Mother        renal cancer  . Stroke Paternal Grandmother   . Alcohol abuse Brother   . Cancer Sister        renal cancer  . Diabetes Brother   . Heart Problems Daughter   . Healthy Daughter   . Healthy Daughter   . Healthy Son   . Healthy Son   . Arthritis Unknown        parents  . Hyperlipidemia Unknown        parent  .  Hypertension Unknown        parent/grandparent  . Diabetes Unknown        parent/grandparent  . Heart disease Other   . Mental illness Other   . Colon cancer Neg Hx   . Esophageal cancer Neg Hx   . Prostate cancer Neg Hx   . Rectal cancer Neg Hx   . Seizures Neg Hx   . Colon polyps Neg Hx     Social History   Social History  . Marital status: Divorced    Spouse name: N/A  . Number of children: 5  . Years of education: 20   Occupational History  . Retired    Social History Main Topics  . Smoking status: Former Smoker    Quit date: 12/25/1964  . Smokeless tobacco: Never Used     Comment: smoked 6 years   . Alcohol use Yes     Comment: occ with steak dinner   . Drug use: No  . Sexual activity: Not Asked   Other Topics Concern  . 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 [place]: exercising on regular visit; trying to work on diet            Epworth Sleepiness Scale = 20 (as of 02/02/2016)              Current Outpatient Prescriptions:  .  ACCU-CHEK FASTCLIX LANCETS MISC, USE AS DIRECTED, Disp: 102 each, Rfl: 3 .  acetaminophen (TYLENOL) 500 MG tablet, Take 1,000 mg by mouth every 8 (eight) hours as needed., Disp: , Rfl:  .  amLODipine (NORVASC) 5 MG tablet, TAKE ONE TABLET BY MOUTH ONCE DAILY, Disp: 90 tablet, Rfl: 1 .  aspirin 81 MG tablet, Take 81 mg by mouth daily., Disp: , Rfl:  .  Blood Glucose Calibration (ACCU-CHEK AVIVA) SOLN, Use as directed., Disp: 1 each, Rfl: 1 .  calcium-vitamin D (OSCAL 500/200 D-3) 500-200 MG-UNIT per tablet, Take 1 tablet by mouth daily., Disp: , Rfl:  .  cloNIDine (CATAPRES) 0.1 MG tablet, TAKE 1 TABLET BY MOUTH TWICE A DAY, Disp: 180 tablet, Rfl: 1 .  ferrous sulfate 325 (65 FE) MG tablet, Take 325 mg by mouth daily with breakfast., Disp: , Rfl:  .  glucose blood (ACCU-CHEK AVIVA PLUS) test strip,  Use as instructed to check blood sugar twice a day, Disp: 100 each, Rfl: 5 .  HUMALOG MIX 75/25 KWIKPEN (75-25) 100 UNIT/ML Kwikpen, INJECT 10 UNITS INTO THE SKIN 2 (TWO) TIMES DAILY BEFORE A MEAL., Disp: 15 pen, Rfl:  4 .  HUMALOG MIX 75/25 KWIKPEN (75-25) 100 UNIT/ML Kwikpen, INJECT 10 UNITS INTO THE SKIN 2 (TWO) TIMES DAILY BEFORE A MEAL., Disp: 15 mL, Rfl: 3 .  hydrALAZINE (APRESOLINE) 25 MG tablet, TAKE 1 TABLET (25 MG TOTAL) BY MOUTH 2 (TWO) TIMES DAILY., Disp: 180 tablet, Rfl: 1 .  Insulin Pen Needle (B-D UF III MINI PEN NEEDLES) 31G X 5 MM MISC, USE TO INJECT INSULIN TWICE A DAY, Disp: 100 each, Rfl: 4 .  isosorbide dinitrate (ISORDIL) 20 MG tablet, TAKE 1 TABLET (20 MG TOTAL) BY MOUTH 2 (TWO) TIMES DAILY., Disp: 60 tablet, Rfl: 5 .  Lancet Devices (ACCU-CHEK SOFTCLIX) lancets, Use as instructed twice a day, Disp: 1 each, Rfl: 5 .  levothyroxine (SYNTHROID, LEVOTHROID) 125 MCG tablet, Take 1 tablet (125 mcg total) by mouth daily., Disp: 90 tablet, Rfl: 1 .  losartan (COZAAR) 50 MG tablet, TAKE ONE TABLET BY MOUTH ONCE DAILY, Disp: 90 tablet, Rfl: 1 .  metFORMIN (GLUCOPHAGE) 1000 MG tablet, TAKE ONE TABLET BY MOUTH TWICE DAILY WITH MEALS, Disp: 180 tablet, Rfl: 1 .  Sennosides-Docusate Sodium (STOOL SOFTENER & LAXATIVE PO), Take 1 tablet by mouth daily as needed (constipation). , Disp: , Rfl:  .  simvastatin (ZOCOR) 20 MG tablet, TAKE 1 TABLET BY MOUTH EVERY DAY, Disp: 90 tablet, Rfl: 1 .  vitamin B-12 (CYANOCOBALAMIN) 1000 MCG tablet, Take 1,000 mcg by mouth daily., Disp: , Rfl:   Current Facility-Administered Medications:  .  ipratropium-albuterol (DUONEB) 0.5-2.5 (3) MG/3ML nebulizer solution 3 mL, 3 mL, Nebulization, Once, Kordsmeier, Gregary Signs, FNP  EXAM:  Vitals:   08/21/17 1322  BP: 132/80  Pulse: 71  Temp: 97.9 F (36.6 C)    Body mass index is 44.71 kg/m.  GENERAL: vitals reviewed and listed above, alert, oriented, appears well hydrated and in no acute distress  HEENT:  atraumatic, conjunttiva clear, no obvious abnormalities on inspection of external nose and ears  NECK: no obvious masses on inspection  LUNGS: clear to auscultation bilaterally, no wheezes, rales or rhonchi, good air movement  CV: HRRR, no peripheral edema  MS/skin: moves all extremities without noticeable abnormality, on evaluation of her right foot there is no swelling, redness or warmth, normal range of motion of toes and feet, no breaks in skin  PSYCH: pleasant and cooperative, no obvious depression or anxiety  ASSESSMENT AND PLAN:  Discussed the following assessment and plan:  Type 2 diabetes mellitus with other circulatory complication, with long-term current use of insulin (HCC) - Plan: Hemoglobin A1c  Hypertension associated with diabetes ([place]) - Plan: Basic metabolic panel, CBC  Hyperlipidemia associated with type 2 diabetes mellitus (HCC)  Morbid obesity (HCC)  Hypothyroidism, unspecified type - Plan: TSH  Gout, unspecified cause, unspecified chronicity, unspecified site - Plan: Uric Acid  Mild episode of recurrent major depressive disorder ([place]), Chronic  -labs -Lifestyle recommendations -Flu shot -We opted to check uric acid and add back allopurinol if elevated, she does not want to take this otherwise -Plans to follow-up with her podiatrist any further foot issues -She plans to see her specialist at [facility] about her back pain -She agrees to call and reschedule her colonoscopy -Advised update her ophthalmology exam -Encouraged her to get back into silver sneakers program and perhaps bring whites to clean the equipments and she thinks she got a cold there last year -annual wellness visit and physical in 3-4 months -Patient advised to return or notify a doctor immediately if symptoms worsen or persist or new concerns arise.  Patient Instructions  BEFORE YOU LEAVE: -flu shot -follow up: AWV with susan and CPE with Dr. Maudie Mercury - same day in November or December; fasting  if convenient -labs  We have ordered labs or studies at this visit. It can take up to 1-2 weeks for results and processing. IF results require follow up or explanation, we will call you with instructions. Clinically stable results will be released to your [facility]. If you have not heard from [place] or cannot find your results in [facility] in 2 weeks please contact our office at [phone].  If you are not yet signed up for [facility], please consider signing up.   We recommend the following healthy lifestyle for LIFE: 1) Small portions.   Tip: eat off of a salad plate instead of a dinner plate.  Tip: It is ok to feel hungry after a meal - that likely means you ate an appropriate portion.  Tip: if you need more or a snack choose fruits, veggies and/or a handful of nuts or seeds.  2) Eat a healthy clean diet.   TRY TO EAT: -at least 5-7 servings of low sugar vegetables per day (not corn, potatoes or bananas.) -berries are the best choice if you wish to eat fruit.   -lean meets (fish, chicken or [place] breasts) -vegan proteins for some meals - beans or tofu, whole grains, nuts and seeds -Replace bad fats with good fats - good fats include: fish, nuts and seeds, canola oil, olive oil -small amounts of low fat or non fat dairy -small amounts of100 % whole grains - check the lables  AVOID: -SUGAR, sweets, anything with added sugar, corn syrup or sweeteners -if you must have a sweetener, small amounts of stevia may be best -sweetened beverages -simple starches (rice, bread, potatoes, pasta, chips, etc - small amounts of 100% whole grains are ok) -red meat, pork, butter -fried foods, fast food, processed food, excessive dairy, eggs and coconut.  3)Get at least 150 minutes of sweaty aerobic exercise per week.  4)Reduce stress - consider counseling, meditation and relaxation to balance other aspects of your life.   WE NOW OFFER   [facility] FAST TRACK!!!  SAME DAY Appointments for  ACUTE CARE  Such as: Sprains, Injuries, cuts, abrasions, rashes, muscle pain, joint pain, back pain Colds, flu, sore throats, headache, allergies, cough, fever  Ear pain, sinus and eye infections Abdominal pain, nausea, vomiting, diarrhea, upset stomach Animal/insect bites  3 Easy Ways to Schedule: Walk-In Scheduling Call in scheduling Mychart Sign-up: [email or web address]                 Colin Benton R., DO

## 2017-08-21 NOTE — Telephone Encounter (Signed)
Heather from [facility] called and stated the pt has been seen in their office for an eye problem only and will be due for an eye exam in November.  Message sent to Dr Maudie Mercury as Juluis Rainier.

## 2017-08-21 NOTE — Patient Instructions (Addendum)
BEFORE YOU LEAVE: -flu shot -follow up: AWV with susan and CPE with Dr. Maudie Mercury - same day in November or December; fasting if convenient -labs  We have ordered labs or studies at this visit. It can take up to 1-2 weeks for results and processing. IF results require follow up or explanation, we will call you with instructions. Clinically stable results will be released to your [facility]. If you have not heard from [place] or cannot find your results in [facility] in 2 weeks please contact our office at [phone].  If you are not yet signed up for [facility], please consider signing up.   We recommend the following healthy lifestyle for LIFE: 1) Small portions.   Tip: eat off of a salad plate instead of a dinner plate.  Tip: It is ok to feel hungry after a meal - that likely means you ate an appropriate portion.  Tip: if you need more or a snack choose fruits, veggies and/or a handful of nuts or seeds.  2) Eat a healthy clean diet.   TRY TO EAT: -at least 5-7 servings of low sugar vegetables per day (not corn, potatoes or bananas.) -berries are the best choice if you wish to eat fruit.   -lean meets (fish, chicken or [place] breasts) -vegan proteins for some meals - beans or tofu, whole grains, nuts and seeds -Replace bad fats with good fats - good fats include: fish, nuts and seeds, canola oil, olive oil -small amounts of low fat or non fat dairy -small amounts of100 % whole grains - check the lables  AVOID: -SUGAR, sweets, anything with added sugar, corn syrup or sweeteners -if you must have a sweetener, small amounts of stevia may be best -sweetened beverages -simple starches (rice, bread, potatoes, pasta, chips, etc - small amounts of 100% whole grains are ok) -red meat, pork, butter -fried foods, fast food, processed food, excessive dairy, eggs and coconut.  3)Get at least 150 minutes of sweaty aerobic exercise per week.  4)Reduce stress - consider counseling, meditation and relaxation to  balance other aspects of your life.   WE NOW OFFER   [facility] FAST TRACK!!!  SAME DAY Appointments for ACUTE CARE  Such as: Sprains, Injuries, cuts, abrasions, rashes, muscle pain, joint pain, back pain Colds, flu, sore throats, headache, allergies, cough, fever  Ear pain, sinus and eye infections Abdominal pain, nausea, vomiting, diarrhea, upset stomach Animal/insect bites  3 Easy Ways to Schedule: Walk-In Scheduling Call in scheduling Mychart Sign-up: [email or web address]

## 2017-08-21 NOTE — Addendum Note (Signed)
Addended by: Agnes Lawrence on: 08/21/2017 02:06 PM   Modules accepted: Orders

## 2017-08-24 MED ORDER — ALLOPURINOL 100 MG PO TABS: 100.0000 mg | ORAL_TABLET | Freq: Every day | ORAL | 3 refills | Status: DC

## 2017-08-24 NOTE — Addendum Note (Signed)
Addended by: Agnes Lawrence on: 08/24/2017 03:11 PM   Modules accepted: Orders

## 2017-09-07 ENCOUNTER — Ambulatory Visit (INDEPENDENT_AMBULATORY_CARE_PROVIDER_SITE_OTHER): Payer: Medicare Other | Admitting: *Deleted

## 2017-09-07 DIAGNOSIS — R55 Syncope and collapse: Secondary | ICD-10-CM

## 2017-09-10 NOTE — Progress Notes (Signed)
Carelink Summary Report / Loop Recorder 

## 2017-09-11 LAB — CUP PACEART REMOTE DEVICE CHECK
MDC IDC PG IMPLANT DT: 20170221
MDC IDC SESS DTM: 20180915021859

## 2017-09-13 ENCOUNTER — Encounter: Payer: Self-pay | Admitting: Family Medicine

## 2017-10-08 ENCOUNTER — Ambulatory Visit (INDEPENDENT_AMBULATORY_CARE_PROVIDER_SITE_OTHER): Payer: Medicare Other | Admitting: *Deleted

## 2017-10-08 DIAGNOSIS — R55 Syncope and collapse: Secondary | ICD-10-CM

## 2017-10-09 NOTE — Progress Notes (Signed)
Carelink Summary Report / Loop Recorder 

## 2017-10-10 LAB — CUP PACEART REMOTE DEVICE CHECK
Date Time Interrogation Session: 20181015021100
Implantable Pulse Generator Implant Date: 20170221

## 2017-10-16 ENCOUNTER — Other Ambulatory Visit: Payer: Self-pay | Admitting: Family Medicine

## 2017-10-18 ENCOUNTER — Other Ambulatory Visit: Payer: Self-pay | Admitting: Family Medicine

## 2017-10-19 ENCOUNTER — Other Ambulatory Visit: Payer: Self-pay | Admitting: Family Medicine

## 2017-10-22 ENCOUNTER — Telehealth: Payer: Self-pay | Admitting: *Deleted

## 2017-10-22 NOTE — Telephone Encounter (Signed)
Spoke with patient regarding tachy episode from 10/18/17 at 1646, duration 6sec (transmitted on 10/21/17).  Patient reports that she may have been taking a nap at the time of the episode.  She didn't feel well that day (no specific symptoms), but she denies presyncope or syncope.  Advised patient that I will route this message to Dr. Sallyanne Kuster for any recommendations.  Also advised patient that if she experiences presyncope or syncope in the future, she should call our office or seek emergency medical attention.  Patient verbalizes agreement with plan.

## 2017-10-22 NOTE — Telephone Encounter (Signed)
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  Continue monitoring Thanks, MCr

## 2017-11-05 NOTE — Progress Notes (Signed)
HPI:  Here for follow up: Due for labs, colonoscopy, eye exam, bone density - seeing Katie Mcintyre for preventive care today.  History of poor compliance. Going to gym 1-2 days per week. Blood sugars fasting 120-150. Reports diet has been "bad". Feel well except small spot on L lower let for 1 week. Itches a little. Has chronic mild swelling in LE - does not wear her compression socks. She did not take her blood pressure medications this morning because she is fasting.  -Concerns and/or follow up today:  Seeing Katie Mcintyre today for her Medicare annual wellness exam.  DM: -circulatory complications -meds: metformin, asa, losartan, mixed humolog 75/25 13units bid -switched to mixed insulin 04/2015 -Opthomologist: sees ophthalmology every 4 months per her report -podiatrist is Dr. Amalia Hailey  HLD/Obesity: -meds simvastatin 20 -poor lifestyle  HTN/Chronic LE edema: -meds: isosorbide, hydralazine, clonidine, metoprolol, losartan, norvasc -chronic LE edema intermittently - non-compliant with compression  Hypothyroid:  -on levothyroxine  Anemia:  -on iron therapy from prior PCP Had colonoscopy with polpys, repeat advised in 08/2016   Hx Gout: -on allopurinol from prior PCP  H of Depression: resolved -meds: none currently  Hx Pre-syncope/AMS events: -s/p eval with neurology and seeing cardiology in 2017 with Echo, has implanted loop recorder -on review OSH records hx abnormal EKG,LAFB in the past per notes  Chronic back pain/lumbar radiculopathy and spinal stenosis: -seeing Dr. Marcos Eke at [facility] for this -walks with cane    I do not prescribe opioid pain medications for this patient. -Diet: variety of foods, balance and well rounded, larger portion sizes -Exercise: no regular exercise -Taking folic acid, vitamin D or calcium: no -Diabetes and Dyslipidemia Screening: Last labs were in August with diabetes lab improving some -Vaccines: see vaccine section EPIC -pap history:  Had complete hysterectomy for cervical cancer -FDLMP: see nursing notes -sexual activity: yes, female partner, no new partners -wants STI testing (Hep C if born [birth year]): no -FH breast, colon or ovarian ca: see FH Last mammogram: Last mammogram in the system was in 2014 Last colon cancer screening: Last colonoscopy was in 2014 with Dr. Hilarie Fredrickson -Had colonoscopy with polpys, repeat advised in 08/2016 - she did not do this yet Breast Ca Risk Assessment: see family history and pt history  -Alcohol, Tobacco, drug use: see social history  Review of Systems - no fevers, unintentional weight loss, vision loss, hearing loss, chest pain, sob, hemoptysis, melena, hematochezia, hematuria, genital discharge, changing or concerning skin lesions, bleeding, bruising, loc, thoughts of self harm or SI  Past Medical History:  Diagnosis Date  . Anemia   . Arthritis   . Cancer ([place])    cervical  . Cataract    removed both eyes with lens implant both   . Chicken pox   . Chronic bronchitis ([place])   . Colon polyp   . Depression 06/30/2013  . Diabetes mellitus without complication ([place])   . Diverticulitis   . Frequent headaches   . GERD (gastroesophageal reflux disease)   . Gout 06/30/2013  . Hyperlipidemia   . Hypertension   . S/P epidural steroid injection 09/27/2016   done in back at [facility]   . Spinal stenosis of lumbar region    s/p spine surgery 02/12/13 with Dr Marcos Eke at [facility]  . Thyroid disease   . Urine incontinence   . Use of cane as ambulatory aid     Past Surgical History:  Procedure Laterality Date  . ABDOMINAL HYSTERECTOMY  1970   complete hysterectomy for cervical cancer  .  BACK SURGERY  01/2013  . BACK SURGERY  1995  . CATARACT EXTRACTION, BILATERAL     with lens implant   . CERVICAL CONE BIOPSY    . COLONOSCOPY    . POLYPECTOMY    . WRIST SURGERY  2010   for wrist fracture    Family History  Problem Relation Age of Onset  . Alcohol abuse Father   . Cirrhosis Father   . Heart  attack Brother   . Breast cancer Sister   . Cancer Mother        renal cancer  . Stroke Paternal Grandmother   . Alcohol abuse Brother   . Cancer Sister        renal cancer  . Diabetes Brother   . Heart Problems Daughter   . Healthy Daughter   . Healthy Daughter   . Healthy Son   . Healthy Son   . Arthritis Unknown        parents  . Hyperlipidemia Unknown        parent  . Hypertension Unknown        parent/grandparent  . Diabetes Unknown        parent/grandparent  . Heart disease Other   . Mental illness Other   . Colon cancer Neg Hx   . Esophageal cancer Neg Hx   . Prostate cancer Neg Hx   . Rectal cancer Neg Hx   . Seizures Neg Hx   . Colon polyps Neg Hx     Social History   Socioeconomic History  . Marital status: Divorced    Spouse name: None  . Number of children: 5  . Years of education: 56  . Highest education level: None  Social Needs  . Financial resource strain: None  . Food insecurity - worry: None  . Food insecurity - inability: None  . Transportation needs - medical: None  . Transportation needs - non-medical: None  Occupational History  . Occupation: Retired  Tobacco Use  . Smoking status: Former Smoker    Last attempt to quit: 12/25/1964    Years since quitting: 52.9  . Smokeless tobacco: Never Used  . Tobacco comment: smoked 6 years / when she was in college   Substance and Sexual Activity  . Alcohol use: Yes    Comment: occ with steak dinner   . Drug use: No  . Sexual activity: None  Other Topics Concern  . None  Social History Narrative   Work or School: retired Nurse, children's Situation: lives with son in [place]: exercising on regular visit; trying to work on diet            Epworth Sleepiness Scale = 20 (as of 02/02/2016)           Current Outpatient Medications:  .  ACCU-CHEK FASTCLIX LANCETS MISC, USE AS DIRECTED, Disp: 102 each, Rfl: 3 .  acetaminophen  (TYLENOL) 500 MG tablet, Take 1,000 mg by mouth every 8 (eight) hours as needed., Disp: , Rfl:  .  allopurinol (ZYLOPRIM) 100 MG tablet, Take 1 tablet (100 mg total) by mouth daily., Disp: 90 tablet, Rfl: 3 .  amLODipine (NORVASC) 5 MG tablet, TAKE ONE TABLET BY MOUTH ONCE DAILY, Disp: 90 tablet, Rfl: 1 .  aspirin 81 MG tablet, Take 81 mg by mouth daily., Disp: , Rfl:  .  B-D UF III MINI PEN NEEDLES 31G  X 5 MM MISC, USE TO INJECT INSULIN TWICE A DAY, Disp: 100 each, Rfl: 3 .  Blood Glucose Calibration (ACCU-CHEK AVIVA) SOLN, Use as directed., Disp: 1 each, Rfl: 1 .  calcium-vitamin D (OSCAL 500/200 D-3) 500-200 MG-UNIT per tablet, Take 1 tablet by mouth daily., Disp: , Rfl:  .  cloNIDine (CATAPRES) 0.1 MG tablet, TAKE 1 TABLET BY MOUTH TWICE A DAY, Disp: 180 tablet, Rfl: 1 .  ferrous sulfate 325 (65 FE) MG tablet, Take 325 mg by mouth daily with breakfast., Disp: , Rfl:  .  glucose blood (ACCU-CHEK AVIVA PLUS) test strip, Use as instructed to check blood sugar twice a day, Disp: 100 each, Rfl: 5 .  HUMALOG MIX 75/25 KWIKPEN (75-25) 100 UNIT/ML Kwikpen, INJECT 10 UNITS INTO THE SKIN 2 (TWO) TIMES DAILY BEFORE A MEAL., Disp: 15 pen, Rfl: 4 .  HUMALOG MIX 75/25 KWIKPEN (75-25) 100 UNIT/ML Kwikpen, INJECT 10 UNITS INTO THE SKIN 2 (TWO) TIMES DAILY BEFORE A MEAL., Disp: 15 mL, Rfl: 3 .  hydrALAZINE (APRESOLINE) 25 MG tablet, TAKE 1 TABLET (25 MG TOTAL) BY MOUTH 2 (TWO) TIMES DAILY., Disp: 180 tablet, Rfl: 1 .  isosorbide dinitrate (ISORDIL) 20 MG tablet, TAKE 1 TABLET (20 MG TOTAL) BY MOUTH 2 (TWO) TIMES DAILY., Disp: 60 tablet, Rfl: 5 .  Lancet Devices (ACCU-CHEK SOFTCLIX) lancets, Use as instructed twice a day, Disp: 1 each, Rfl: 5 .  levothyroxine (SYNTHROID, LEVOTHROID) 125 MCG tablet, TAKE 1 TABLET BY MOUTH EVERY DAY, Disp: 90 tablet, Rfl: 1 .  losartan (COZAAR) 50 MG tablet, TAKE ONE TABLET BY MOUTH ONCE DAILY, Disp: 90 tablet, Rfl: 1 .  metFORMIN (GLUCOPHAGE) 1000 MG tablet, TAKE ONE TABLET BY  MOUTH TWICE DAILY WITH MEALS, Disp: 180 tablet, Rfl: 1 .  Sennosides-Docusate Sodium (STOOL SOFTENER & LAXATIVE PO), Take 1 tablet by mouth daily as needed (constipation). , Disp: , Rfl:  .  simvastatin (ZOCOR) 20 MG tablet, TAKE 1 TABLET BY MOUTH EVERY DAY, Disp: 90 tablet, Rfl: 1 .  vitamin B-12 (CYANOCOBALAMIN) 1000 MCG tablet, Take 1,000 mcg by mouth daily., Disp: , Rfl:   Current Facility-Administered Medications:  .  ipratropium-albuterol (DUONEB) 0.5-2.5 (3) MG/3ML nebulizer solution 3 mL, 3 mL, Nebulization, Once, Kordsmeier, Gregary Signs, FNP  EXAM:  Vitals:   11/06/17 0813  BP: 132/84  Pulse: 64  SpO2: 95%    GENERAL: vitals reviewed and listed below, alert, oriented, appears well hydrated and in no acute distress  HEENT: head atraumatic, PERRLA, normal appearance of eyes, ears, nose and mouth. moist mucus membranes.  NECK: supple, no masses or lymphadenopathy  LUNGS: clear to auscultation bilaterally, no rales, rhonchi or wheeze  CV: HRRR, no peripheral edema or cyanosis, normal pedal pulses, tr LE edema  SKIN: small erythematous papule L lower outer ankle  ABDOMEN: bowel sounds normal, soft, non tender to palpation, no masses, no rebound or guarding  SKIN: no rash or abnormal lesions  MS: normal gait, moves all extremities normally  NEURO: normal gait, speech and thought processing grossly intact, muscle tone grossly intact throughout  PSYCH: normal affect, pleasant and cooperative  ASSESSMENT AND PLAN:  Medicare annual wellness visit, subsequent  Morbid obesity (HCC)  Mild episode of recurrent major depressive disorder ([place])  Type 2 diabetes mellitus with other circulatory complication, with long-term current use of insulin ([place]) - Plan: Hemoglobin A1c, Lipid panel  Hypothyroidism, unspecified type - Plan: TSH  Hypertension associated with diabetes ([place]) - Plan: Basic metabolic panel, CBC  Lifestyle recs Labs today  Not on he BP meds today - BP would likely  be ok on meds Topical ointment for very small spot on leg, compression and elevation legs Follow up 3 months, sooner as needed  Patient Instructions   BEFORE YOU LEAVE: -? Bone density -labs -follow up: 3 months  Topical antibiotic for the spot on the leg. Compression and elevation legs. Follow up immediately if worsens or not improving.  Ms. Juncaj , Thank you for taking time to come for your Medicare Wellness Visit. I appreciate your ongoing commitment to your health goals. Please review the following plan we discussed and let me know if I can assist you in the future.   Will schedule your colonoscopy - Dr Hilarie Fredrickson   Shingrix is a vaccine for the prevention of Shingles in Adults 55 and older.  If you are on Medicare, you can request a prescription from your doctor to be filled at a pharmacy.  Please check with your benefits regarding applicable copays or out of pocket expenses.  The Shingrix is given in 2 vaccines approx 8 weeks apart. You must receive the 2nd dose prior to 6 months from receipt of the first.   Try Yasso bars; (frozen ice cream yogurt)   Your eye apt with Dr. Elmer Sow is Nov 27th 10:15 Have them send [place] your report   We have your last mammogram in 2014; [facility];Susan will check on date   Katie Mcintyre to send you information the Advanced Directive; at [email or web address]  Will try to complete AD; Given copy  Referred to [facility] for questions [place] offers free advance directive forms, as well as assistance in completing the forms themselves. For assistance, contact the Spiritual Care Department at [phone], or the Clinical Social Work Department at [phone].   Prevention of falls: Remove rugs or any tripping hazards in the home Use Non slip mats in bathtubs and showers Placing grab bars next to the toilet and or shower Placing handrails on both sides of the stair way Adding extra lighting in the home.   Personal safety issues reviewed:  1. Consider  starting a community watch program per [facility] 2.  Changes batteries is smoke detector and/or carbon monoxide detector  3.  If you have firearms; keep them in a safe place 4.  Wear protection when in the sun; Always wear sunscreen or a hat; It is good to have your doctor check your skin annually or review any new areas of concern 5. Driving safety; Keep in the right lane; stay 3 car lengths behind the car in front of you on the highway; look 3 times prior to pulling out; carry your cell phone everywhere you go!    Learn about the Yellow Dot program:  The program allows first responders at your emergency to have access to who your physician is, as well as your medications and medical conditions.  Citizens requesting the Yellow Dot Packages should contact Master Corporal Nunzio Cobbs at the [facility] [phone] for the first week of the program and beginning the week after Easter citizens should contact their Scientist, physiological.      These are the goals we discussed:to go to the gym  Goals    None      This is a list of the screening recommended for you and due dates:  Health Maintenance  Topic Date Due  . Colon Cancer Screening  09/11/2016  . Eye exam for diabetics  11/21/2017*  . DEXA scan (bone  density measurement)  02/28/2025*  . Hemoglobin A1C  02/21/2018  . Complete foot exam   04/16/2018  . Tetanus Vaccine  07/01/2023  . Flu Shot  Completed  *Topic was postponed. The date shown is not the original due date.    Health Maintenance, Female Adopting a healthy lifestyle and getting preventive care can go a long way to promote health and wellness. Talk with your health care provider about what schedule of regular examinations is right for you. This is a good chance for you to check in with your provider about disease prevention and staying healthy. In between checkups, there are plenty of things you can do on your own. Experts have  done a lot of research about which lifestyle changes and preventive measures are most likely to keep you healthy. Ask your health care provider for more information. Weight and diet Eat a healthy diet  Be sure to include plenty of vegetables, fruits, low-fat dairy products, and lean protein.  Do not eat a lot of foods high in solid fats, added sugars, or salt.  Get regular exercise. This is one of the most important things you can do for your health. ? Most adults should exercise for at least 150 minutes each week. The exercise should increase your heart rate and make you sweat (moderate-intensity exercise). ? Most adults should also do strengthening exercises at least twice a week. This is in addition to the moderate-intensity exercise.  Maintain a healthy weight  Body mass index (BMI) is a measurement that can be used to identify possible weight problems. It estimates body fat based on height and weight. Your health care provider can help determine your BMI and help you achieve or maintain a healthy weight.  For females 2 years of age and older: ? A BMI below 18.5 is considered underweight. ? A BMI of 18.5 to 24.9 is normal. ? A BMI of 25 to 29.9 is considered overweight. ? A BMI of 30 and above is considered obese.  Watch levels of cholesterol and blood lipids  You should start having your blood tested for lipids and cholesterol at 77 years of age, then have this test every 5 years.  You may need to have your cholesterol levels checked more often if: ? Your lipid or cholesterol levels are high. ? You are older than 77 years of age. ? You are at high risk for heart disease.  Cancer screening Lung Cancer  Lung cancer screening is recommended for adults 5-17 years old who are at high risk for lung cancer because of a history of smoking.  A yearly low-dose CT scan of the lungs is recommended for people who: ? Currently smoke. ? Have quit within the past 15 years. ? Have at  least a 30-pack-year history of smoking. A pack year is smoking an average of one pack of cigarettes a day for 1 year.  Yearly screening should continue until it has been 15 years since you quit.  Yearly screening should stop if you develop a health problem that would prevent you from having lung cancer treatment.  Breast Cancer  Practice breast self-awareness. This means understanding how your breasts normally appear and feel.  It also means doing regular breast self-exams. Let your health care provider know about any changes, no matter how small.  If you are in your 20s or 30s, you should have a clinical breast exam (CBE) by a health care provider every 1-3 years as part of a regular health exam.  If you are 40 or older, have a CBE every year. Also consider having a breast X-ray (mammogram) every year.  If you have a family history of breast cancer, talk to your health care provider about genetic screening.  If you are at high risk for breast cancer, talk to your health care provider about having an MRI and a mammogram every year.  Breast cancer gene (BRCA) assessment is recommended for women who have family members with BRCA-related cancers. BRCA-related cancers include: ? Breast. ? Ovarian. ? Tubal. ? Peritoneal cancers.  Results of the assessment will determine the need for genetic counseling and BRCA1 and BRCA2 testing.  Cervical Cancer Your health care provider may recommend that you be screened regularly for cancer of the pelvic organs (ovaries, uterus, and vagina). This screening involves a pelvic examination, including checking for microscopic changes to the surface of your cervix (Pap test). You may be encouraged to have this screening done every 3 years, beginning at age 53.  For women ages 7-65, health care providers may recommend pelvic exams and Pap testing every 3 years, or they may recommend the Pap and pelvic exam, combined with testing for human papilloma virus  (HPV), every 5 years. Some types of HPV increase your risk of cervical cancer. Testing for HPV may also be done on women of any age with unclear Pap test results.  Other health care providers may not recommend any screening for nonpregnant women who are considered low risk for pelvic cancer and who do not have symptoms. Ask your health care provider if a screening pelvic exam is right for you.  If you have had past treatment for cervical cancer or a condition that could lead to cancer, you need Pap tests and screening for cancer for at least 20 years after your treatment. If Pap tests have been discontinued, your risk factors (such as having a new sexual partner) need to be reassessed to determine if screening should resume. Some women have medical problems that increase the chance of getting cervical cancer. In these cases, your health care provider may recommend more frequent screening and Pap tests.  Colorectal Cancer  This type of cancer can be detected and often prevented.  Routine colorectal cancer screening usually begins at 77 years of age and continues through 77 years of age.  Your health care provider may recommend screening at an earlier age if you have risk factors for colon cancer.  Your health care provider may also recommend using home test kits to check for hidden blood in the stool.  A small camera at the end of a tube can be used to examine your colon directly (sigmoidoscopy or colonoscopy). This is done to check for the earliest forms of colorectal cancer.  Routine screening usually begins at age 65.  Direct examination of the colon should be repeated every 5-10 years through 77 years of age. However, you may need to be screened more often if early forms of precancerous polyps or small growths are found.  Skin Cancer  Check your skin from head to toe regularly.  Tell your health care provider about any new moles or changes in moles, especially if there is a change in a  mole's shape or color.  Also tell your health care provider if you have a mole that is larger than the size of a pencil eraser.  Always use sunscreen. Apply sunscreen liberally and repeatedly throughout the day.  Protect yourself by wearing long sleeves, pants, a wide-brimmed hat, and  sunglasses whenever you are outside.  Heart disease, diabetes, and high blood pressure  High blood pressure causes heart disease and increases the risk of stroke. High blood pressure is more likely to develop in: ? People who have blood pressure in the high end of the normal range (130-139/85-89 mm Hg). ? People who are overweight or obese. ? People who are African American.  If you are 27-51 years of age, have your blood pressure checked every 3-5 years. If you are 61 years of age or older, have your blood pressure checked every year. You should have your blood pressure measured twice-once when you are at a hospital or clinic, and once when you are not at a hospital or clinic. Record the average of the two measurements. To check your blood pressure when you are not at a hospital or clinic, you can use: ? An automated blood pressure machine at a pharmacy. ? A home blood pressure monitor.  If you are between 83 years and 52 years old, ask your health care provider if you should take aspirin to prevent strokes.  Have regular diabetes screenings. This involves taking a blood sample to check your fasting blood sugar level. ? If you are at a normal weight and have a low risk for diabetes, have this test once every three years after 77 years of age. ? If you are overweight and have a high risk for diabetes, consider being tested at a younger age or more often. Preventing infection Hepatitis B  If you have a higher risk for hepatitis B, you should be screened for this virus. You are considered at high risk for hepatitis B if: ? You were born in a country where hepatitis B is common. Ask your health care provider  which countries are considered high risk. ? Your parents were born in a high-risk country, and you have not been immunized against hepatitis B (hepatitis B vaccine). ? You have HIV or AIDS. ? You use needles to inject street drugs. ? You live with someone who has hepatitis B. ? You have had sex with someone who has hepatitis B. ? You get hemodialysis treatment. ? You take certain medicines for conditions, including cancer, organ transplantation, and autoimmune conditions.  Hepatitis C  Blood testing is recommended for: ? Everyone born from [birth year] through 1966. ? Anyone with known risk factors for hepatitis C.  Sexually transmitted infections (STIs)  You should be screened for sexually transmitted infections (STIs) including gonorrhea and chlamydia if: ? You are sexually active and are younger than 77 years of age. ? You are older than 77 years of age and your health care provider tells you that you are at risk for this type of infection. ? Your sexual activity has changed since you were last screened and you are at an increased risk for chlamydia or gonorrhea. Ask your health care provider if you are at risk.  If you do not have HIV, but are at risk, it may be recommended that you take a prescription medicine daily to prevent HIV infection. This is called pre-exposure prophylaxis (PrEP). You are considered at risk if: ? You are sexually active and do not regularly use condoms or know the HIV status of your partner(s). ? You take drugs by injection. ? You are sexually active with a partner who has HIV.  Talk with your health care provider about whether you are at high risk of being infected with HIV. If you choose to begin PrEP, you should  first be tested for HIV. You should then be tested every 3 months for as long as you are taking PrEP. Pregnancy  If you are premenopausal and you may become pregnant, ask your health care provider about preconception counseling.  If you may become  pregnant, take 400 to 800 micrograms (mcg) of folic acid every day.  If you want to prevent pregnancy, talk to your health care provider about birth control (contraception). Osteoporosis and menopause  Osteoporosis is a disease in which the bones lose minerals and strength with aging. This can result in serious bone fractures. Your risk for osteoporosis can be identified using a bone density scan.  If you are 9 years of age or older, or if you are at risk for osteoporosis and fractures, ask your health care provider if you should be screened.  Ask your health care provider whether you should take a calcium or vitamin D supplement to lower your risk for osteoporosis.  Menopause may have certain physical symptoms and risks.  Hormone replacement therapy may reduce some of these symptoms and risks. Talk to your health care provider about whether hormone replacement therapy is right for you. Follow these instructions at home:  Schedule regular health, dental, and eye exams.  Stay current with your immunizations.  Do not use any tobacco products including cigarettes, chewing tobacco, or electronic cigarettes.  If you are pregnant, do not drink alcohol.  If you are breastfeeding, limit how much and how often you drink alcohol.  Limit alcohol intake to no more than 1 drink per day for nonpregnant women. One drink equals 12 ounces of beer, 5 ounces of wine, or 1 ounces of hard liquor.  Do not use street drugs.  Do not share needles.  Ask your health care provider for help if you need support or information about quitting drugs.  Tell your health care provider if you often feel depressed.  Tell your health care provider if you have ever been abused or do not feel safe at home. This information is not intended to replace advice given to you by your health care provider. Make sure you discuss any questions you have with your health care provider. Document Released: 06/26/2011 Document  Revised: 05/18/2016 Document Reviewed: 09/14/2015 Elsevier Interactive Patient Education  2018 [place] in the Home Falls can cause injuries and can affect people from all age groups. There are many simple things that you can do to make your home safe and to help prevent falls. What can I do on the outside of my home?  Regularly repair the edges of walkways and driveways and fix any cracks.  Remove high doorway thresholds.  Trim any shrubbery on the main path into your home.  Use bright outdoor lighting.  Clear walkways of debris and clutter, including tools and rocks.  Regularly check that handrails are securely fastened and in good repair. Both sides of any steps should have handrails.  Install guardrails along the edges of any raised decks or porches.  Have leaves, snow, and ice cleared regularly.  Use sand or salt on walkways during winter months.  In the garage, clean up any spills right away, including grease or oil spills. What can I do in the bathroom?  Use night lights.  Install grab bars by the toilet and in the tub and shower. Do not use towel bars as grab bars.  Use non-skid mats or decals on the floor of the tub or shower.  If you  need to sit down while you are in the shower, use a plastic, non-slip stool.  Keep the floor dry. Immediately clean up any water that spills on the floor.  Remove soap buildup in the tub or shower on a regular basis.  Attach bath mats securely with double-sided non-slip rug tape.  Remove throw rugs and other tripping hazards from the floor. What can I do in the bedroom?  Use night lights.  Make sure that a bedside light is easy to reach.  Do not use oversized bedding that drapes onto the floor.  Have a firm chair that has side arms to use for getting dressed.  Remove throw rugs and other tripping hazards from the floor. What can I do in the kitchen?  Clean up any spills right away.  Avoid  walking on wet floors.  Place frequently used items in easy-to-reach places.  If you need to reach for something above you, use a sturdy step stool that has a grab bar.  Keep electrical cables out of the way.  Do not use floor polish or wax that makes floors slippery. If you have to use wax, make sure that it is non-skid floor wax.  Remove throw rugs and other tripping hazards from the floor. What can I do in the stairways?  Do not leave any items on the stairs.  Make sure that there are handrails on both sides of the stairs. Fix handrails that are broken or loose. Make sure that handrails are as long as the stairways.  Check any carpeting to make sure that it is firmly attached to the stairs. Fix any carpet that is loose or worn.  Avoid having throw rugs at the top or bottom of stairways, or secure the rugs with carpet tape to prevent them from moving.  Make sure that you have a light switch at the top of the stairs and the bottom of the stairs. If you do not have them, have them installed. What are some other fall prevention tips?  Wear closed-toe shoes that fit well and support your feet. Wear shoes that have rubber soles or low heels.  When you use a stepladder, make sure that it is completely opened and that the sides are firmly locked. Have someone hold the ladder while you are using it. Do not climb a closed stepladder.  Add color or contrast paint or tape to grab bars and handrails in your home. Place contrasting color strips on the first and last steps.  Use mobility aids as needed, such as canes, walkers, scooters, and crutches.  Turn on lights if it is dark. Replace any light bulbs that burn out.  Set up furniture so that there are clear paths. Keep the furniture in the same spot.  Fix any uneven floor surfaces.  Choose a carpet design that does not hide the edge of steps of a stairway.  Be aware of any and all pets.  Review your medicines with your healthcare  provider. Some medicines can cause dizziness or changes in blood pressure, which increase your risk of falling. Talk with your health care provider about other ways that you can decrease your risk of falls. This may include working with a physical therapist or trainer to improve your strength, balance, and endurance. This information is not intended to replace advice given to you by your health care provider. Make sure you discuss any questions you have with your health care provider. Document Released: 12/01/2002 Document Revised: 05/09/2016 Document Reviewed: 01/15/2015  Chartered certified accountant Patient Education  2017 [employer].     No Follow-up on file.  Colin Benton R., DO

## 2017-11-05 NOTE — Progress Notes (Signed)
Subjective:   Katie Mcintyre is a 76 y.o. female who presents for Medicare Annual (Subsequent) preventive examination.  The Patient was informed that the wellness visit is to identify future health risk and educate and initiate measures that can reduce risk for increased disease through the lifespan.    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 COLONOSCOPY  09/11/2016   Will schedule a colonoscopy - was sick last year Feb to May  [facility]  Had viral conjunctivitis  Mammogram 07/2013 -  Ophthalmology due  Dexa due - postponed  Flu vaccine 07/2017 Shingrix   VS reviewed; BP elevated but has not taken her BP med this am   Diet  Breakfast  Diet is "terrible" usually gets up and has coffee and grits Then eggs and sometimes meat  Some fruit  Dinner around 4 to 6  Trying to eat chicken; roast beef; hamburger  Pork ribs; cooked barbecued  Frozen vegetables  Eating a nutty buddy every pm  Difficulty getting up at hs to the bathroom   BMI 46   Ex[facility] Go to pedal on bike sometimes Move at home  Started using a cane years ago  Had a walker since 2008; use it around the home      Cardiac Risk Factors Addressed Family hx MI; breast cancer  Results for Katie Mcintyre (MRN [id number]) as of 11/05/2017 19:24  Ref. Range 08/21/2017 14:11    Glucose Latest Ref Range: 70 - 99 mg/dL 117 (H)  Hemoglobin A1C Latest Ref Range: 4.6 - 6.5 % 7.5 (H)   Lipids chol/hsl 3;    Advanced Directives does not have one formally, But has things written down for the family Will emmi out on AD [email or web address] ( s j p)    Patient Care Team: [name], DO as PCP - General (Family Medicine) [name], DO (Family Medicine) Jola Schmidt, MD as Consulting Physician (Ophthalmology)   Cardiac Risk Factors include: advanced age (>63men, >74 women);diabetes mellitus;dyslipidemia;family history of premature cardiovascular disease;hypertension;obesity (BMI >30kg/m2);sedentary lifestyle     Objective:     Vitals: BP (!) 156/80   Pulse 64   Ht 5\' 1"  (1.549 m)   Wt 246 lb 2 oz (111.6 kg)   SpO2 95%   BMI 46.50 kg/m   Body mass index is 46.5 kg/m.   Tobacco Social History   Tobacco Use  Smoking Status Former Smoker  . Last attempt to quit: 12/25/1964  . Years since quitting: 52.9  Smokeless Tobacco Never Used  Tobacco Comment   smoked 6 years / when she was in college      Counseling given: Yes Comment: smoked 6 years / when she was in college    Past Medical History:  Diagnosis Date  . Anemia   .  Arthritis   . Cancer ([place])    cervical  . Cataract    removed both eyes with lens implant both   . Chicken pox   . Chronic bronchitis ([place])   . Colon polyp   . Depression 06/30/2013  . Diabetes mellitus without complication ([place])   . Diverticulitis   . Frequent headaches   . GERD (gastroesophageal reflux disease)   . Gout 06/30/2013  . Hyperlipidemia   . Hypertension   . S/P epidural steroid injection 09/27/2016   done in back at [facility]   . Spinal stenosis of lumbar region    s/p spine surgery 02/12/13 with Dr Marcos [facility]ke at [facility]  . Thyroid disease   . Urine incontinence   . Use of cane as ambulatory aid    Past Surgical History:  Procedure Laterality Date  . ABDOMINAL HYSTERECTOMY  1970   complete hysterectomy for  cervical cancer  . BACK SURGERY  01/2013  . BACK SURGERY  1995  . CATARACT EXTRACTION, BILATERAL     with lens implant   . CERVICAL CONE BIOPSY    . COLONOSCOPY    . POLYPECTOMY    . WRIST SURGERY  2010   for wrist fracture   Family History  Problem Relation Age of Onset  . Alcohol abuse Father   . Cirrhosis Father   . Heart attack Brother   . Breast cancer Sister   . Cancer Mother        renal cancer  . Stroke Paternal Grandmother   . Alcohol abuse Brother   . Cancer Sister        renal cancer  . Diabetes Brother   . Heart Problems Daughter   . Healthy Daughter   . Healthy Daughter   . Healthy Son   . Healthy Son   . Arthritis Unknown        parents  . Hyperlipidemia Unknown        parent  . Hypertension Unknown        parent/grandparent  . Diabetes Unknown        parent/grandparent  . Heart disease Other   . Mental illness Other   . Colon cancer Neg Hx   . Esophageal cancer Neg Hx   . Prostate cancer Neg Hx   . Rectal cancer Neg Hx   . Seizures Neg Hx   . Colon polyps Neg Hx    Social History   Substance and Sexual Activity  Sexual Activity Not on file    Outpatient Encounter Medications as of 11/06/2017  Medication Sig  . ACCU-CHEK FASTCLIX LANCETS MISC USE AS DIRECTED  . acetaminophen (TYLENOL) 500 MG tablet Take 1,000 mg by mouth every 8 (eight) hours as needed.  Marland Kitchen allopurinol (ZYLOPRIM) 100 MG tablet Take 1 tablet (100 mg total) by mouth daily.  Marland Kitchen amLODipine (NORVASC) 5 MG tablet TAKE ONE TABLET BY MOUTH ONCE DAILY  . aspirin 81 MG tablet Take 81 mg by mouth daily.  . B-D UF III MINI PEN NEEDLES 31G X 5 MM MISC USE TO INJECT INSULIN TWICE A DAY  . Blood Glucose Calibration (ACCU-CHEK AVIVA) SOLN Use as directed.  . calcium-vitamin D (OSCAL 500/200 D-3) 500-200 MG-UNIT per tablet Take 1 tablet by mouth daily.  . cloNIDine (CATAPRES) 0.1 MG tablet TAKE 1 TABLET BY MOUTH TWICE A DAY  . ferrous sulfate 325 (65 FE) MG tablet Take 325 mg by mouth daily  with breakfast.  . glucose blood (ACCU-CHEK AVIVA PLUS) test strip Use  as instructed to check blood sugar twice a day  . HUMALOG MIX 75/25 KWIKPEN (75-25) 100 UNIT/ML Kwikpen INJECT 10 UNITS INTO THE SKIN 2 (TWO) TIMES DAILY BEFORE A MEAL.  Marland Kitchen HUMALOG MIX 75/25 KWIKPEN (75-25) 100 UNIT/ML Kwikpen INJECT 10 UNITS INTO THE SKIN 2 (TWO) TIMES DAILY BEFORE A MEAL.  . hydrALAZINE (APRESOLINE) 25 MG tablet TAKE 1 TABLET (25 MG TOTAL) BY MOUTH 2 (TWO) TIMES DAILY.  . isosorbide dinitrate (ISORDIL) 20 MG tablet TAKE 1 TABLET (20 MG TOTAL) BY MOUTH 2 (TWO) TIMES DAILY.  Marland Kitchen Lancet Devices (ACCU-CHEK SOFTCLIX) lancets Use as instructed twice a day  . levothyroxine (SYNTHROID, LEVOTHROID) 125 MCG tablet TAKE 1 TABLET BY MOUTH EVERY DAY  . losartan (COZAAR) 50 MG tablet TAKE ONE TABLET BY MOUTH ONCE DAILY  . metFORMIN (GLUCOPHAGE) 1000 MG tablet TAKE ONE TABLET BY MOUTH TWICE DAILY WITH MEALS  . Sennosides-Docusate Sodium (STOOL SOFTENER & LAXATIVE PO) Take 1 tablet by mouth daily as needed (constipation).   . simvastatin (ZOCOR) 20 MG tablet TAKE 1 TABLET BY MOUTH EVERY DAY  . vitamin B-12 (CYANOCOBALAMIN) 1000 MCG tablet Take 1,000 mcg by mouth daily.   Facility-Administered Encounter Medications as of 11/06/2017  Medication  . ipratropium-albuterol (DUONEB) 0.5-2.5 (3) MG/3ML nebulizer solution 3 mL    Activities of Daily Living In your present state of health, do you have any difficulty performing the following activities: 11/06/2017 11/07/2016  Hearing? N N  Vision? N Y  Comment - needs glasses and eye exam   Difficulty concentrating or making decisions? 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 Y Y  Comment does not use them  can climb but avoids   Dressing or bathing? N N  Doing errands, shopping? N N  Preparing Food and eating ? N N  Using the Toilet? N N  In the past six months, have you accidently leaked urine? Tempie Donning  Comment has to  get up at hs  gets up at hs to go to the bathroom  Do you have problems with loss of bowel control? Y N  Comment taking stool softener -  Managing your Medications? N N  Managing your Finances? N N  Housekeeping or managing your Housekeeping? N N  Some recent data might be hidden    Patient Care Team: [name], DO as PCP - General (Family Medicine) [name], DO (Family Medicine) Jola Schmidt, MD as Consulting Physician (Ophthalmology)    Assessment:     Exercise Activities and Dietary recommendations Current Exercise Habits: Home exercise routine, Time (Minutes): 30, Frequency (Times/Week): 3, Weekly Exercise (Minutes/Week): 90, Intensity: Mild  Goals    . Weight (lb) < 200 lb (90.7 kg)     Lose 10 lbs Look for [employer]  Go to the gym more  3 times  A wqeek      Fall Risk Fall Risk  11/06/2017 11/07/2016 11/07/2016 06/01/2015 08/21/2013  Falls in the past year? No No No No No   Depression Screen PHQ 2/9 Scores 11/06/2017 11/07/2016 11/07/2016 08/14/2016  PHQ - 2 Score 0 0 0 0     Cognitive Function MMSE - Mini Mental State Exam 11/06/2017 11/07/2016  Not completed: (No Data) (No Data)   Ad8 score reviewed for issues:  Issues making decisions:  Less interest in hobbies / activities:  Repeats questions, stories (family complaining):  Trouble using ordinary gadgets (microwave, computer, phone):  Forgets the month or year:   Mismanaging finances:   Remembering appts:  Daily problems with thinking and/or memory: Ad8 score is=0        Immunization History  Administered Date(s) Administered  . Influenza, High Dose Seasonal PF 09/27/2015, 11/07/2016, 08/21/2017  . Influenza,inj,Quad PF,6+ Mos 08/21/2013, 09/11/2014  . Pneumococcal Conjugate-13 03/01/2015  . Pneumococcal Polysaccharide-23 06/30/2013  . Tdap 06/30/2013   Screening Tests Health Maintenance  Topic Date Due  . COLONOSCOPY  09/11/2016  . OPHTHALMOLOGY EXAM  11/21/2017  (Originally 09/11/2015)  . DEXA SCAN  02/28/2025 (Originally 03/28/2005)  . HEMOGLOBIN A1C  02/21/2018  . FOOT EXAM  04/16/2018  . TETANUS/TDAP  07/01/2023  . INFLUENZA VACCINE  Completed      Plan:     PCP Notes   Health Maintenance  Reviewed shingrix  To  Call for colonoscopy  Eye exam coming up in November 27th at 10:15   Abnormal Screens  Going to the bathroom at hs;  Gets up 3 times (may check BS at hs)   Referrals  None  Patient concerns; Wants to lose weight  Agrees to start exercising again   Nurse Concerns; [name] to call to see when you had your last mammogram [phone]  Discussed watching her portions   [name] to call and get the date of her last mammogram  Next PCP apt  Today    I have personally reviewed and noted the following in the patient's chart:   . Medical and social history . Use of alcohol, tobacco or illicit drugs  . Current medications and supplements . Functional ability and status . Nutritional status . Physical activity . Advanced directives . List of other physicians . Hospitalizations, surgeries, and ER visits in previous 12 months . Vitals . Screenings to include cognitive, depression, and falls . Referrals and appointments  In addition, I have reviewed and discussed with patient certain preventive protocols, quality metrics, and best practice recommendations. A written personalized care plan for preventive services as well as general preventive health recommendations were provided to patient.     Wynetta Fines, RN  11/06/2017   [name]., DO

## 2017-11-06 ENCOUNTER — Ambulatory Visit (INDEPENDENT_AMBULATORY_CARE_PROVIDER_SITE_OTHER): Payer: Medicare Other | Admitting: Family Medicine

## 2017-11-06 ENCOUNTER — Ambulatory Visit (INDEPENDENT_AMBULATORY_CARE_PROVIDER_SITE_OTHER): Payer: Medicare Other | Admitting: *Deleted

## 2017-11-06 ENCOUNTER — Encounter: Payer: Self-pay | Admitting: Family Medicine

## 2017-11-06 VITALS — BP 132/84 | HR 64 | Ht 61.0 in | Wt 246.1 lb

## 2017-11-06 DIAGNOSIS — E1159 Type 2 diabetes mellitus with other circulatory complications: Secondary | ICD-10-CM

## 2017-11-06 DIAGNOSIS — I10 Essential (primary) hypertension: Secondary | ICD-10-CM | POA: Diagnosis not present

## 2017-11-06 DIAGNOSIS — E2839 Other primary ovarian failure: Secondary | ICD-10-CM | POA: Diagnosis not present

## 2017-11-06 DIAGNOSIS — Z794 Long term (current) use of insulin: Secondary | ICD-10-CM

## 2017-11-06 DIAGNOSIS — E039 Hypothyroidism, unspecified: Secondary | ICD-10-CM | POA: Diagnosis not present

## 2017-11-06 DIAGNOSIS — F330 Major depressive disorder, recurrent, mild: Secondary | ICD-10-CM | POA: Diagnosis not present

## 2017-11-06 DIAGNOSIS — R55 Syncope and collapse: Secondary | ICD-10-CM | POA: Diagnosis not present

## 2017-11-06 DIAGNOSIS — Z0000 Encounter for general adult medical examination without abnormal findings: Secondary | ICD-10-CM | POA: Diagnosis not present

## 2017-11-06 DIAGNOSIS — I152 Hypertension secondary to endocrine disorders: Secondary | ICD-10-CM

## 2017-11-06 LAB — TSH: TSH: 1 u[IU]/mL (ref 0.35–4.50)

## 2017-11-06 LAB — BASIC METABOLIC PANEL
BUN: 19 mg/dL (ref 6–23)
CALCIUM: 9.8 mg/dL (ref 8.4–10.5)
CO2: 28 meq/L (ref 19–32)
Chloride: 102 mEq/L (ref 96–112)
Creatinine, Ser: 0.74 mg/dL (ref 0.40–1.20)
GFR: 97.71 mL/min (ref >60.00)
Glucose, Bld: 131 mg/dL — ABNORMAL HIGH (ref 70–99)
POTASSIUM: 4.1 meq/L (ref 3.5–5.1)
SODIUM: 140 meq/L (ref 135–145)

## 2017-11-06 LAB — LIPID PANEL
CHOLESTEROL: 163 mg/dL (ref 0–200)
HDL: 60.3 mg/dL (ref >39.00)
LDL Cholesterol: 77 mg/dL (ref 0–99)
NonHDL: 102.83
TRIGLYCERIDES: 129 mg/dL (ref 0.0–149.0)
Total CHOL/HDL Ratio: 3
VLDL: 25.8 mg/dL (ref 0.0–40.0)

## 2017-11-06 LAB — CBC
HCT: 39.1 % (ref 36.0–46.0)
Hemoglobin: 12.6 g/dL (ref 12.0–15.0)
MCHC: 32.3 g/dL (ref 30.0–36.0)
MCV: 87.4 fl (ref 78.0–100.0)
PLATELETS: 296 10*3/uL (ref 150.0–400.0)
RBC: 4.48 Mil/uL (ref 3.87–5.11)
RDW: 15.1 % (ref 11.5–15.5)
WBC: 6.9 10*3/uL (ref 4.0–10.5)

## 2017-11-06 LAB — HEMOGLOBIN A1C: Hgb A1c MFr Bld: 8 % — ABNORMAL HIGH (ref 4.6–6.5)

## 2017-11-06 NOTE — Patient Instructions (Addendum)
BEFORE YOU LEAVE: -? Bone density -labs -follow up: 3 months  Topical antibiotic for the spot on the leg. Compression and elevation legs. Follow up immediately if worsens or not improving.  Katie Mcintyre , Thank you for taking time to come for your Medicare Wellness Visit. I appreciate your ongoing commitment to your health goals. Please review the following plan we discussed and let me know if I can assist you in the future.   Will schedule your colonoscopy - Dr Hilarie Fredrickson   Shingrix is a vaccine for the prevention of Shingles in Adults 21 and older.  If you are on Medicare, you can request a prescription from your doctor to be filled at a pharmacy.  Please check with your benefits regarding applicable copays or out of pocket expenses.  The Shingrix is given in 2 vaccines approx 8 weeks apart. You must receive the 2nd dose prior to 6 months from receipt of the first.   Try Yasso bars; (frozen ice cream yogurt)   Your eye apt with Dr. Elmer Sow is Nov 27th 10:15 Have them send [place] your report   We have your last mammogram in 2014; [facility];Katie Mcintyre will check on date   Katie Mcintyre to send you information the Advanced Directive; at [email or web address]  Will try to complete AD; Given copy  Referred to [facility] for questions [place] offers free advance directive forms, as well as assistance in completing the forms themselves. For assistance, contact the Spiritual Care Department at [phone], or the Clinical Social Work Department at [phone].   Prevention of falls: Remove rugs or any tripping hazards in the home Use Non slip mats in bathtubs and showers Placing grab bars next to the toilet and or shower Placing handrails on both sides of the stair way Adding extra lighting in the home.   Personal safety issues reviewed:  1. Consider starting a community watch program per [facility] 2.  Changes batteries is smoke detector and/or carbon monoxide detector  3.  If you have  firearms; keep them in a safe place 4.  Wear protection when in the sun; Always wear sunscreen or a hat; It is good to have your doctor check your skin annually or review any new areas of concern 5. Driving safety; Keep in the right lane; stay 3 car lengths behind the car in front of you on the highway; look 3 times prior to pulling out; carry your cell phone everywhere you go!    Learn about the Yellow Dot program:  The program allows first responders at your emergency to have access to who your physician is, as well as your medications and medical conditions.  Citizens requesting the Yellow Dot Packages should contact Master Corporal Nunzio Cobbs at the [facility] [phone] for the first week of the program and beginning the week after Easter citizens should contact their Scientist, physiological.      These are the goals we discussed:to go to the gym  Goals    None      This is a list of the screening recommended for you and due dates:  Health Maintenance  Topic Date Due  . Colon Cancer Screening  09/11/2016  . Eye exam for diabetics  11/21/2017*  . DEXA scan (bone density measurement)  02/28/2025*  . Hemoglobin A1C  02/21/2018  . Complete foot exam   04/16/2018  . Tetanus Vaccine  07/01/2023  . Flu Shot  Completed  *Topic was postponed. The date shown is not  the original due date.    Health Maintenance, Female Adopting a healthy lifestyle and getting preventive care can go a long way to promote health and wellness. Talk with your health care provider about what schedule of regular examinations is right for you. This is a good chance for you to check in with your provider about disease prevention and staying healthy. In between checkups, there are plenty of things you can do on your own. Experts have done a lot of research about which lifestyle changes and preventive measures are most likely to keep you healthy. Ask your health care provider for more  information. Weight and diet Eat a healthy diet  Be sure to include plenty of vegetables, fruits, low-fat dairy products, and lean protein.  Do not eat a lot of foods high in solid fats, added sugars, or salt.  Get regular exercise. This is one of the most important things you can do for your health. ? Most adults should exercise for at least 150 minutes each week. The exercise should increase your heart rate and make you sweat (moderate-intensity exercise). ? Most adults should also do strengthening exercises at least twice a week. This is in addition to the moderate-intensity exercise.  Maintain a healthy weight  Body mass index (BMI) is a measurement that can be used to identify possible weight problems. It estimates body fat based on height and weight. Your health care provider can help determine your BMI and help you achieve or maintain a healthy weight.  For females 68 years of age and older: ? A BMI below 18.5 is considered underweight. ? A BMI of 18.5 to 24.9 is normal. ? A BMI of 25 to 29.9 is considered overweight. ? A BMI of 30 and above is considered obese.  Watch levels of cholesterol and blood lipids  You should start having your blood tested for lipids and cholesterol at 77 years of age, then have this test every 5 years.  You may need to have your cholesterol levels checked more often if: ? Your lipid or cholesterol levels are high. ? You are older than 77 years of age. ? You are at high risk for heart disease.  Cancer screening Lung Cancer  Lung cancer screening is recommended for adults 64-55 years old who are at high risk for lung cancer because of a history of smoking.  A yearly low-dose CT scan of the lungs is recommended for people who: ? Currently smoke. ? Have quit within the past 15 years. ? Have at least a 30-pack-year history of smoking. A pack year is smoking an average of one pack of cigarettes a day for 1 year.  Yearly screening should continue  until it has been 15 years since you quit.  Yearly screening should stop if you develop a health problem that would prevent you from having lung cancer treatment.  Breast Cancer  Practice breast self-awareness. This means understanding how your breasts normally appear and feel.  It also means doing regular breast self-exams. Let your health care provider know about any changes, no matter how small.  If you are in your 20s or 30s, you should have a clinical breast exam (CBE) by a health care provider every 1-3 years as part of a regular health exam.  If you are 67 or older, have a CBE every year. Also consider having a breast X-ray (mammogram) every year.  If you have a family history of breast cancer, talk to your health care provider about genetic  screening.  If you are at high risk for breast cancer, talk to your health care provider about having an MRI and a mammogram every year.  Breast cancer gene (BRCA) assessment is recommended for women who have family members with BRCA-related cancers. BRCA-related cancers include: ? Breast. ? Ovarian. ? Tubal. ? Peritoneal cancers.  Results of the assessment will determine the need for genetic counseling and BRCA1 and BRCA2 testing.  Cervical Cancer Your health care provider may recommend that you be screened regularly for cancer of the pelvic organs (ovaries, uterus, and vagina). This screening involves a pelvic examination, including checking for microscopic changes to the surface of your cervix (Pap test). You may be encouraged to have this screening done every 3 years, beginning at age 53.  For women ages 24-65, health care providers may recommend pelvic exams and Pap testing every 3 years, or they may recommend the Pap and pelvic exam, combined with testing for human papilloma virus (HPV), every 5 years. Some types of HPV increase your risk of cervical cancer. Testing for HPV may also be done on women of any age with unclear Pap test  results.  Other health care providers may not recommend any screening for nonpregnant women who are considered low risk for pelvic cancer and who do not have symptoms. Ask your health care provider if a screening pelvic exam is right for you.  If you have had past treatment for cervical cancer or a condition that could lead to cancer, you need Pap tests and screening for cancer for at least 20 years after your treatment. If Pap tests have been discontinued, your risk factors (such as having a new sexual partner) need to be reassessed to determine if screening should resume. Some women have medical problems that increase the chance of getting cervical cancer. In these cases, your health care provider may recommend more frequent screening and Pap tests.  Colorectal Cancer  This type of cancer can be detected and often prevented.  Routine colorectal cancer screening usually begins at 77 years of age and continues through 77 years of age.  Your health care provider may recommend screening at an earlier age if you have risk factors for colon cancer.  Your health care provider may also recommend using home test kits to check for hidden blood in the stool.  A small camera at the end of a tube can be used to examine your colon directly (sigmoidoscopy or colonoscopy). This is done to check for the earliest forms of colorectal cancer.  Routine screening usually begins at age 34.  Direct examination of the colon should be repeated every 5-10 years through 77 years of age. However, you may need to be screened more often if early forms of precancerous polyps or small growths are found.  Skin Cancer  Check your skin from head to toe regularly.  Tell your health care provider about any new moles or changes in moles, especially if there is a change in a mole's shape or color.  Also tell your health care provider if you have a mole that is larger than the size of a pencil eraser.  Always use sunscreen.  Apply sunscreen liberally and repeatedly throughout the day.  Protect yourself by wearing long sleeves, pants, a wide-brimmed hat, and sunglasses whenever you are outside.  Heart disease, diabetes, and high blood pressure  High blood pressure causes heart disease and increases the risk of stroke. High blood pressure is more likely to develop in: ? People who  have blood pressure in the high end of the normal range (130-139/85-89 mm Hg). ? People who are overweight or obese. ? People who are African American.  If you are 52-69 years of age, have your blood pressure checked every 3-5 years. If you are 65 years of age or older, have your blood pressure checked every year. You should have your blood pressure measured twice-once when you are at a hospital or clinic, and once when you are not at a hospital or clinic. Record the average of the two measurements. To check your blood pressure when you are not at a hospital or clinic, you can use: ? An automated blood pressure machine at a pharmacy. ? A home blood pressure monitor.  If you are between 16 years and 36 years old, ask your health care provider if you should take aspirin to prevent strokes.  Have regular diabetes screenings. This involves taking a blood sample to check your fasting blood sugar level. ? If you are at a normal weight and have a low risk for diabetes, have this test once every three years after 77 years of age. ? If you are overweight and have a high risk for diabetes, consider being tested at a younger age or more often. Preventing infection Hepatitis B  If you have a higher risk for hepatitis B, you should be screened for this virus. You are considered at high risk for hepatitis B if: ? You were born in a country where hepatitis B is common. Ask your health care provider which countries are considered high risk. ? Your parents were born in a high-risk country, and you have not been immunized against hepatitis B (hepatitis B  vaccine). ? You have HIV or AIDS. ? You use needles to inject street drugs. ? You live with someone who has hepatitis B. ? You have had sex with someone who has hepatitis B. ? You get hemodialysis treatment. ? You take certain medicines for conditions, including cancer, organ transplantation, and autoimmune conditions.  Hepatitis C  Blood testing is recommended for: ? Everyone born from [birth year] through 1966. ? Anyone with known risk factors for hepatitis C.  Sexually transmitted infections (STIs)  You should be screened for sexually transmitted infections (STIs) including gonorrhea and chlamydia if: ? You are sexually active and are younger than 77 years of age. ? You are older than 77 years of age and your health care provider tells you that you are at risk for this type of infection. ? Your sexual activity has changed since you were last screened and you are at an increased risk for chlamydia or gonorrhea. Ask your health care provider if you are at risk.  If you do not have HIV, but are at risk, it may be recommended that you take a prescription medicine daily to prevent HIV infection. This is called pre-exposure prophylaxis (PrEP). You are considered at risk if: ? You are sexually active and do not regularly use condoms or know the HIV status of your partner(s). ? You take drugs by injection. ? You are sexually active with a partner who has HIV.  Talk with your health care provider about whether you are at high risk of being infected with HIV. If you choose to begin PrEP, you should first be tested for HIV. You should then be tested every 3 months for as long as you are taking PrEP. Pregnancy  If you are premenopausal and you may become pregnant, ask your health care provider about  preconception counseling.  If you may become pregnant, take 400 to 800 micrograms (mcg) of folic acid every day.  If you want to prevent pregnancy, talk to your health care provider about birth control  (contraception). Osteoporosis and menopause  Osteoporosis is a disease in which the bones lose minerals and strength with aging. This can result in serious bone fractures. Your risk for osteoporosis can be identified using a bone density scan.  If you are 64 years of age or older, or if you are at risk for osteoporosis and fractures, ask your health care provider if you should be screened.  Ask your health care provider whether you should take a calcium or vitamin D supplement to lower your risk for osteoporosis.  Menopause may have certain physical symptoms and risks.  Hormone replacement therapy may reduce some of these symptoms and risks. Talk to your health care provider about whether hormone replacement therapy is right for you. Follow these instructions at home:  Schedule regular health, dental, and eye exams.  Stay current with your immunizations.  Do not use any tobacco products including cigarettes, chewing tobacco, or electronic cigarettes.  If you are pregnant, do not drink alcohol.  If you are breastfeeding, limit how much and how often you drink alcohol.  Limit alcohol intake to no more than 1 drink per day for nonpregnant women. One drink equals 12 ounces of beer, 5 ounces of wine, or 1 ounces of hard liquor.  Do not use street drugs.  Do not share needles.  Ask your health care provider for help if you need support or information about quitting drugs.  Tell your health care provider if you often feel depressed.  Tell your health care provider if you have ever been abused or do not feel safe at home. This information is not intended to replace advice given to you by your health care provider. Make sure you discuss any questions you have with your health care provider. Document Released: 06/26/2011 Document Revised: 05/18/2016 Document Reviewed: 09/14/2015 Elsevier Interactive Patient Education  2018 [place] in the Home Falls can cause  injuries and can affect people from all age groups. There are many simple things that you can do to make your home safe and to help prevent falls. What can I do on the outside of my home?  Regularly repair the edges of walkways and driveways and fix any cracks.  Remove high doorway thresholds.  Trim any shrubbery on the main path into your home.  Use bright outdoor lighting.  Clear walkways of debris and clutter, including tools and rocks.  Regularly check that handrails are securely fastened and in good repair. Both sides of any steps should have handrails.  Install guardrails along the edges of any raised decks or porches.  Have leaves, snow, and ice cleared regularly.  Use sand or salt on walkways during winter months.  In the garage, clean up any spills right away, including grease or oil spills. What can I do in the bathroom?  Use night lights.  Install grab bars by the toilet and in the tub and shower. Do not use towel bars as grab bars.  Use non-skid mats or decals on the floor of the tub or shower.  If you need to sit down while you are in the shower, use a plastic, non-slip stool.  Keep the floor dry. Immediately clean up any water that spills on the floor.  Remove soap buildup in the tub or  shower on a regular basis.  Attach bath mats securely with double-sided non-slip rug tape.  Remove throw rugs and other tripping hazards from the floor. What can I do in the bedroom?  Use night lights.  Make sure that a bedside light is easy to reach.  Do not use oversized bedding that drapes onto the floor.  Have a firm chair that has side arms to use for getting dressed.  Remove throw rugs and other tripping hazards from the floor. What can I do in the kitchen?  Clean up any spills right away.  Avoid walking on wet floors.  Place frequently used items in easy-to-reach places.  If you need to reach for something above you, use a sturdy step stool that has a grab  bar.  Keep electrical cables out of the way.  Do not use floor polish or wax that makes floors slippery. If you have to use wax, make sure that it is non-skid floor wax.  Remove throw rugs and other tripping hazards from the floor. What can I do in the stairways?  Do not leave any items on the stairs.  Make sure that there are handrails on both sides of the stairs. Fix handrails that are broken or loose. Make sure that handrails are as long as the stairways.  Check any carpeting to make sure that it is firmly attached to the stairs. Fix any carpet that is loose or worn.  Avoid having throw rugs at the top or bottom of stairways, or secure the rugs with carpet tape to prevent them from moving.  Make sure that you have a light switch at the top of the stairs and the bottom of the stairs. If you do not have them, have them installed. What are some other fall prevention tips?  Wear closed-toe shoes that fit well and support your feet. Wear shoes that have rubber soles or low heels.  When you use a stepladder, make sure that it is completely opened and that the sides are firmly locked. Have someone hold the ladder while you are using it. Do not climb a closed stepladder.  Add color or contrast paint or tape to grab bars and handrails in your home. Place contrasting color strips on the first and last steps.  Use mobility aids as needed, such as canes, walkers, scooters, and crutches.  Turn on lights if it is dark. Replace any light bulbs that burn out.  Set up furniture so that there are clear paths. Keep the furniture in the same spot.  Fix any uneven floor surfaces.  Choose a carpet design that does not hide the edge of steps of a stairway.  Be aware of any and all pets.  Review your medicines with your healthcare provider. Some medicines can cause dizziness or changes in blood pressure, which increase your risk of falling. Talk with your health care provider about other ways that  you can decrease your risk of falls. This may include working with a physical therapist or trainer to improve your strength, balance, and endurance. This information is not intended to replace advice given to you by your health care provider. Make sure you discuss any questions you have with your health care provider. Document Released: 12/01/2002 Document Revised: 05/09/2016 Document Reviewed: 01/15/2015 Elsevier Interactive Patient Education  2017 [employer].

## 2017-11-07 NOTE — Progress Notes (Signed)
Carelink Summary Report / Loop Recorder 

## 2017-11-09 NOTE — Addendum Note (Signed)
Addended by: Agnes Lawrence on: 11/09/2017 02:07 PM   Modules accepted: Orders

## 2017-11-12 ENCOUNTER — Ambulatory Visit: Payer: Medicare Other | Admitting: Podiatry

## 2017-11-17 ENCOUNTER — Other Ambulatory Visit: Payer: Self-pay | Admitting: Family Medicine

## 2017-11-19 ENCOUNTER — Ambulatory Visit: Payer: Medicare Other | Admitting: Podiatry

## 2017-11-19 ENCOUNTER — Encounter: Payer: Self-pay | Admitting: Podiatry

## 2017-11-19 DIAGNOSIS — B351 Tinea unguium: Secondary | ICD-10-CM

## 2017-11-19 DIAGNOSIS — E1142 Type 2 diabetes mellitus with diabetic polyneuropathy: Secondary | ICD-10-CM

## 2017-11-19 DIAGNOSIS — M79609 Pain in unspecified limb: Secondary | ICD-10-CM | POA: Diagnosis not present

## 2017-11-19 NOTE — Patient Instructions (Signed)
Removed Band-Aid on fourth left toe 1-3 days apply topical antibiotic ointment and Band-Aid daily until a scab forms  Diabetes and Foot Care Diabetes may cause you to have problems because of poor blood supply (circulation) to your feet and legs. This may cause the skin on your feet to become thinner, break easier, and heal more slowly. Your skin may become dry, and the skin may peel and crack. You may also have nerve damage in your legs and feet causing decreased feeling in them. You may not notice minor injuries to your feet that could lead to infections or more serious problems. Taking care of your feet is one of the most important things you can do for yourself. Follow these instructions at home:  Wear shoes at all times, even in the house. Do not go barefoot. Bare feet are easily injured.  Check your feet daily for blisters, cuts, and redness. If you cannot see the bottom of your feet, use a mirror or ask someone for help.  Wash your feet with warm water (do not use hot water) and mild soap. Then pat your feet and the areas between your toes until they are completely dry. Do not soak your feet as this can dry your skin.  Apply a moisturizing lotion or petroleum jelly (that does not contain alcohol and is unscented) to the skin on your feet and to dry, brittle toenails. Do not apply lotion between your toes.  Trim your toenails straight across. Do not dig under them or around the cuticle. File the edges of your nails with an emery board or nail file.  Do not cut corns or calluses or try to remove them with medicine.  Wear clean socks or stockings every day. Make sure they are not too tight. Do not wear knee-high stockings since they may decrease blood flow to your legs.  Wear shoes that fit properly and have enough cushioning. To break in new shoes, wear them for just a few hours a day. This prevents you from injuring your feet. Always look in your shoes before you put them on to be sure  there are no objects inside.  Do not cross your legs. This may decrease the blood flow to your feet.  If you find a minor scrape, cut, or break in the skin on your feet, keep it and the skin around it clean and dry. These areas may be cleansed with mild soap and water. Do not cleanse the area with peroxide, alcohol, or iodine.  When you remove an adhesive bandage, be sure not to damage the skin around it.  If you have a wound, look at it several times a day to make sure it is healing.  Do not use heating pads or hot water bottles. They may burn your skin. If you have lost feeling in your feet or legs, you may not know it is happening until it is too late.  Make sure your health care provider performs a complete foot exam at least annually or more often if you have foot problems. Report any cuts, sores, or bruises to your health care provider immediately. Contact a health care provider if:  You have an injury that is not healing.  You have cuts or breaks in the skin.  You have an ingrown nail.  You notice redness on your legs or feet.  You feel burning or tingling in your legs or feet.  You have pain or cramps in your legs and feet.  Your  legs or feet are numb.  Your feet always feel cold. Get help right away if:  There is increasing redness, swelling, or pain in or around a wound.  There is a red line that goes up your leg.  Pus is coming from a wound.  You develop a fever or as directed by your health care provider.  You notice a bad smell coming from an ulcer or wound. This information is not intended to replace advice given to you by your health care provider. Make sure you discuss any questions you have with your health care provider. Document Released: 12/08/2000 Document Revised: 05/18/2016 Document Reviewed: 05/20/2013 Elsevier Interactive Patient Education  2017 [employer].

## 2017-11-19 NOTE — Progress Notes (Signed)
Patient ID: Katie Mcintyre, female   DOB: November 11, 1941, 76 y.o.   MRN: [id number]    This patient presents today complaining of thickened and elongated toenails which are comfortable walking wearing shoes and requests toenail debridement. Patient's last visit for a similar service was on 03/07/2017  Objective: Bilateral peripheral pitting edema DP and PT pulses 2/4 bilaterally Capillary reflex within normal limits bilaterally Sensation to 10 g monofilament wire intact 3/5 right and 5/5 left Vibratory sensation reactive bilaterally Ankle reflexes reactive bilaterally No open skin lesions bilaterally Atrophic skin with absent hair growth bilaterally The toenails are extremely elongated, discolored, deformed, hypertrophic and tender direct palpation 6-10 Patient walks with a cane Hammertoe second bilaterally Manual motor testing dorsi flexion, plantar flexion 5/5 bilaterally  Assessment: Diabetic with a history of peripheral neuropathy Symptomatic mycotic toenails 6-10  Plan: Debridement toenails 6-10 mechanically and electrically with slight bleeding distal fourth left toe, treated with topical antibiotic ointment and Band-Aid. Patient instructed removed Band-Aid 1-3 days continue applying topical antibiotic ointment and Band-Aid daily until a scab forms  Reappoint 3 months

## 2017-11-20 LAB — HM DIABETES EYE EXAM

## 2017-11-22 ENCOUNTER — Telehealth: Payer: Self-pay | Admitting: *Deleted

## 2017-11-22 NOTE — Telephone Encounter (Signed)
Attempted call to patient regarding sending manual transmission to review full 2 minutes of "AF" episodes. No answer, no voicemail available.

## 2017-11-23 LAB — CUP PACEART REMOTE DEVICE CHECK
MDC IDC PG IMPLANT DT: 20170221
MDC IDC SESS DTM: 20181114023817

## 2017-11-29 ENCOUNTER — Other Ambulatory Visit: Payer: Self-pay | Admitting: Cardiovascular Disease

## 2017-11-29 NOTE — Telephone Encounter (Signed)
Attempted call to patient regarding sending manual transmission to review Full 2 minutes of AF episodes. No answer, no voicemail available.

## 2017-12-06 ENCOUNTER — Ambulatory Visit (INDEPENDENT_AMBULATORY_CARE_PROVIDER_SITE_OTHER): Payer: Medicare Other | Admitting: *Deleted

## 2017-12-06 ENCOUNTER — Inpatient Hospital Stay: Admission: RE | Admit: 2017-12-06 | Payer: Medicare Other | Source: Ambulatory Visit

## 2017-12-06 DIAGNOSIS — R55 Syncope and collapse: Secondary | ICD-10-CM

## 2017-12-06 NOTE — Telephone Encounter (Signed)
Spoke with patient to request a manual Carelink transmission for review.  Assisted patient with transmission--successfully received.  Advised patient that we will contact her if anything abnormal upon review.  Patient verbalizes agreement with plan and denies additional questions or concerns at this time.  5 "AF" ECGs received since 11/14/17--most ECGs suggest SR/ST w/PACs and PVCs, not A-fib.  ECGs printed and placed in Dr. Langley Gauss folder for review.  ECG from 11/21 included below, ? Sinus w/frequent ectopy.

## 2017-12-06 NOTE — Telephone Encounter (Signed)
Agree, seems to be SR with very frequent ectopy. MCr

## 2017-12-07 NOTE — Progress Notes (Signed)
Carelink Summary Report / Loop Recorder 

## 2017-12-17 LAB — CUP PACEART REMOTE DEVICE CHECK
Date Time Interrogation Session: 20181214031343
Implantable Pulse Generator Implant Date: 20170221

## 2017-12-21 ENCOUNTER — Other Ambulatory Visit: Payer: Self-pay | Admitting: Family Medicine

## 2017-12-26 ENCOUNTER — Other Ambulatory Visit: Payer: Self-pay | Admitting: Family Medicine

## 2017-12-27 ENCOUNTER — Encounter: Payer: Self-pay | Admitting: Internal Medicine

## 2018-01-03 ENCOUNTER — Encounter: Payer: Self-pay | Admitting: Family Medicine

## 2018-01-04 ENCOUNTER — Other Ambulatory Visit: Payer: Self-pay | Admitting: Family Medicine

## 2018-01-07 ENCOUNTER — Inpatient Hospital Stay: Admission: RE | Admit: 2018-01-07 | Payer: Medicare Other | Source: Ambulatory Visit

## 2018-01-07 ENCOUNTER — Ambulatory Visit (INDEPENDENT_AMBULATORY_CARE_PROVIDER_SITE_OTHER): Payer: Medicare Other | Admitting: *Deleted

## 2018-01-07 DIAGNOSIS — R55 Syncope and collapse: Secondary | ICD-10-CM | POA: Diagnosis not present

## 2018-01-09 NOTE — Progress Notes (Signed)
Carelink Summary Report / Loop Recorder 

## 2018-01-10 ENCOUNTER — Inpatient Hospital Stay: Admission: RE | Admit: 2018-01-10 | Payer: Medicare Other | Source: Ambulatory Visit

## 2018-01-10 LAB — HM DIABETES EYE EXAM

## 2018-01-15 LAB — CUP PACEART REMOTE DEVICE CHECK
Implantable Pulse Generator Implant Date: 20170221
MDC IDC SESS DTM: 20190113034105

## 2018-01-21 ENCOUNTER — Telehealth: Payer: Self-pay | Admitting: *Deleted

## 2018-01-21 NOTE — Telephone Encounter (Signed)
Spoke with patient regarding tachy episode noted on LINQ from 01/19/18 at [facility].  ECG appears SVT, duration 40sec, median V rate 188bpm.  Patient denies any presyncopal/syncopal symptoms with episode.  Advised that Dr. Sallyanne Kuster will review episode and we'll call her back if any additional recommendations.  Patient verbalizes understanding.  Reviewed appropriate use of symptom activator in the event she has presyncopal/syncopal episodes in the future.  Requested that patient call our office to discuss symptoms if she does use her activator.  Patient verbalizes understanding of instructions.

## 2018-01-28 ENCOUNTER — Inpatient Hospital Stay: Admission: RE | Admit: 2018-01-28 | Payer: Medicare Other | Source: Ambulatory Visit

## 2018-02-04 ENCOUNTER — Ambulatory Visit (INDEPENDENT_AMBULATORY_CARE_PROVIDER_SITE_OTHER): Payer: Medicare Other | Admitting: *Deleted

## 2018-02-04 DIAGNOSIS — R55 Syncope and collapse: Secondary | ICD-10-CM | POA: Diagnosis not present

## 2018-02-04 NOTE — Progress Notes (Deleted)
HPI:  Katie Mcintyre is a pleasant 77 y.o. here for follow up. Chronic medical problems summarized below were reviewed for changes and stability and were updated as needed below. These issues and their treatment remain stable for the most part.  Her diabetes was not well controlled at the last check.  She was to increase her sugars to call us with those results so that we could further titrate.***. Denies CP, SOB, DOE, treatment intolerance or new symptoms. Due for eye exam and colon cancer screening.  She has been advised of her colon cancer screening due in the past, she was advised to schedule with her gastroenterologist.  She has not done this yet. Annual wellness visit 11/06/2017 Due for labs. DM: -circulatory complications -meds: metformin, asa, losartan, mixed humolog 75/25 13units bid -switched to mixed insulin 04/2015 -Opthomologist: seesophthalmology every 4 months per her report -podiatrist is Dr. Amalia Hailey  HLD/Obesity: -meds simvastatin 20 -poor lifestyle  HTN/Chronic LE edema: -meds: isosorbide, hydralazine, clonidine, metoprolol, losartan, norvasc -chronic LE edema intermittently - non-compliant with compression  Hypothyroid:  -on levothyroxine  Anemia:  -on iron therapy from prior PCP Had colonoscopy with polpys, repeat advised in 08/2016   Hx Gout: -on allopurinol from prior PCP  H ofDepression: resolved -meds: none currently  Hx Pre-syncope/AMS events: -s/p eval with neurology and seeing cardiology in 2017 with Echo, has implanted loop recorder -on review OSH records hx abnormal EKG,LAFB in the past per notes  Chronic back pain/lumbar radiculopathy and spinal stenosis: -seeing Dr. Marcos Eke at [facility] for this -walks with cane   ROS: See pertinent positives and negatives per HPI.  Past Medical History:  Diagnosis Date  . Anemia   . Arthritis   . Cancer ([place])    cervical  . Cataract    removed both eyes with lens implant both   .  Chicken pox   . Chronic bronchitis ([place])   . Colon polyp   . Depression 06/30/2013  . Diabetes mellitus without complication ([place])   . Diverticulitis   . Frequent headaches   . GERD (gastroesophageal reflux disease)   . Gout 06/30/2013  . Hyperlipidemia   . Hypertension   . S/P epidural steroid injection 09/27/2016   done in back at [place]   . Spinal stenosis of lumbar region    s/p spine surgery 02/12/13 with Dr Marcos Eke at [facility]  . Thyroid disease   . Urine incontinence   . Use of cane as ambulatory aid     Past Surgical History:  Procedure Laterality Date  . ABDOMINAL HYSTERECTOMY  1970   complete hysterectomy for cervical cancer  . BACK SURGERY  01/2013  . BACK SURGERY  1995  . CATARACT EXTRACTION, BILATERAL     with lens implant   . CERVICAL CONE BIOPSY    . COLONOSCOPY    . EP IMPLANTABLE DEVICE N/A 02/15/2016   Procedure: Loop Recorder Insertion;  Surgeon: Sanda Klein, MD;  Location: [facility];  Service: Cardiovascular;  Laterality: N/A;  . POLYPECTOMY    . WRIST SURGERY  2010   for wrist fracture    Family History  Problem Relation Age of Onset  . Alcohol abuse Father   . Cirrhosis Father   . Heart attack Brother   . Breast cancer Sister   . Cancer Mother        renal cancer  . Stroke Paternal Grandmother   . Alcohol abuse Brother   . Cancer Sister  renal cancer  . Diabetes Brother   . Heart Problems Daughter   . Healthy Daughter   . Healthy Daughter   . Healthy Son   . Healthy Son   . Arthritis Unknown        parents  . Hyperlipidemia Unknown        parent  . Hypertension Unknown        parent/grandparent  . Diabetes Unknown        parent/grandparent  . Heart disease Other   . Mental illness Other   . Colon cancer Neg Hx   . Esophageal cancer Neg Hx   . Prostate cancer Neg Hx   . Rectal cancer Neg Hx   . Seizures Neg Hx   . Colon polyps Neg Hx     Social History   Socioeconomic History  . Marital status: Divorced    Spouse  name: Not on file  . Number of children: 5  . Years of education: 74  . Highest education level: Not on file  Social Needs  . Financial resource strain: Not on file  . Food insecurity - worry: Not on file  . Food insecurity - inability: Not on file  . Transportation needs - medical: Not on file  . Transportation needs - non-medical: Not on file  Occupational History  . Occupation: Retired  Tobacco Use  . Smoking status: Former Smoker    Last attempt to quit: 12/25/1964    Years since quitting: 53.1  . Smokeless tobacco: Never Used  . Tobacco comment: smoked 6 years / when she was in college   Substance and Sexual Activity  . Alcohol use: Yes    Comment: occ with steak dinner   . Drug use: No  . Sexual activity: Not on file  Other Topics Concern  . Not on file  Social History Narrative   Work or School: retired Nurse, children's Situation: lives with son in [place]: exercising on regular visit; trying to work on diet            Epworth Sleepiness Scale = 20 (as of 02/02/2016)           Current Outpatient Medications:  .  ACCU-CHEK AVIVA PLUS test strip, USE AS INSTRUCTED TO CHECK BLOOD SUGAR TWICE A DAY * E11.9*, Disp: 100 each, Rfl: 5 .  ACCU-CHEK FASTCLIX LANCETS MISC, USE AS DIRECTED, Disp: 102 each, Rfl: 3 .  acetaminophen (TYLENOL) 500 MG tablet, Take 1,000 mg by mouth every 8 (eight) hours as needed., Disp: , Rfl:  .  allopurinol (ZYLOPRIM) 100 MG tablet, Take 1 tablet (100 mg total) by mouth daily., Disp: 90 tablet, Rfl: 3 .  amLODipine (NORVASC) 5 MG tablet, TAKE 1 TABLET BY MOUTH ONCE DAILY, Disp: 90 tablet, Rfl: 1 .  aspirin 81 MG tablet, Take 81 mg by mouth daily., Disp: , Rfl:  .  B-D UF III MINI PEN NEEDLES 31G X 5 MM MISC, USE TO INJECT INSULIN TWICE A DAY, Disp: 100 each, Rfl: 3 .  Blood Glucose Calibration (ACCU-CHEK AVIVA) SOLN, Use as directed., Disp: 1 each, Rfl: 1 .  calcium-vitamin D  (OSCAL 500/200 D-3) 500-200 MG-UNIT per tablet, Take 1 tablet by mouth daily., Disp: , Rfl:  .  cloNIDine (CATAPRES) 0.1 MG tablet, TAKE 1 TABLET BY MOUTH TWICE A DAY, Disp: 180 tablet, Rfl: 1 .  ferrous sulfate 325 (65  FE) MG tablet, Take 325 mg by mouth daily with breakfast., Disp: , Rfl:  .  HUMALOG MIX 75/25 KWIKPEN (75-25) 100 UNIT/ML Kwikpen, INJECT 10 UNITS INTO THE SKIN 2 (TWO) TIMES DAILY BEFORE A MEAL., Disp: 15 pen, Rfl: 4 .  hydrALAZINE (APRESOLINE) 25 MG tablet, TAKE 1 TABLET (25 MG TOTAL) BY MOUTH 2 (TWO) TIMES DAILY., Disp: 180 tablet, Rfl: 1 .  isosorbide dinitrate (ISORDIL) 20 MG tablet, TAKE 1 TABLET (20 MG TOTAL) BY MOUTH 2 (TWO) TIMES DAILY., Disp: 60 tablet, Rfl: 5 .  Lancet Devices (ACCU-CHEK SOFTCLIX) lancets, Use as instructed twice a day, Disp: 1 each, Rfl: 5 .  levothyroxine (SYNTHROID, LEVOTHROID) 125 MCG tablet, TAKE 1 TABLET BY MOUTH EVERY DAY, Disp: 90 tablet, Rfl: 1 .  losartan (COZAAR) 50 MG tablet, TAKE 1 TABLET BY MOUTH ONCE DAILY, Disp: 90 tablet, Rfl: 1 .  metFORMIN (GLUCOPHAGE) 1000 MG tablet, TAKE 1 TABLET BY MOUTH TWICE DAILY WITH MEALS, Disp: 180 tablet, Rfl: 1 .  Sennosides-Docusate Sodium (STOOL SOFTENER & LAXATIVE PO), Take 1 tablet by mouth daily as needed (constipation). , Disp: , Rfl:  .  simvastatin (ZOCOR) 20 MG tablet, TAKE 1 TABLET BY MOUTH EVERY DAY, Disp: 90 tablet, Rfl: 1 .  vitamin B-12 (CYANOCOBALAMIN) 1000 MCG tablet, Take 1,000 mcg by mouth daily., Disp: , Rfl:   Current Facility-Administered Medications:  .  ipratropium-albuterol (DUONEB) 0.5-2.5 (3) MG/3ML nebulizer solution 3 mL, 3 mL, Nebulization, Once, Kordsmeier, Gregary Signs, FNP  EXAM:  There were no vitals filed for this visit.  There is no height or weight on file to calculate BMI.  GENERAL: vitals reviewed and listed above, alert, oriented, appears well hydrated and in no acute distress  HEENT: atraumatic, conjunttiva clear, no obvious abnormalities on inspection of external nose  and ears  NECK: no obvious masses on inspection  LUNGS: clear to auscultation bilaterally, no wheezes, rales or rhonchi, good air movement  CV: HRRR, no peripheral edema  MS: moves all extremities without noticeable abnormality *** PSYCH: pleasant and cooperative, no obvious depression or anxiety  ASSESSMENT AND PLAN:  Discussed the following assessment and plan:  No diagnosis found.  *** -Patient advised to return or notify a doctor immediately if symptoms worsen or persist or new concerns arise.  There are no Patient Instructions on file for this visit.  [name], DO

## 2018-02-05 ENCOUNTER — Ambulatory Visit: Payer: Medicare Other | Admitting: Family Medicine

## 2018-02-05 NOTE — Progress Notes (Signed)
Carelink Summary Report / Loop Recorder 

## 2018-02-06 ENCOUNTER — Ambulatory Visit: Payer: Medicare Other | Admitting: Family Medicine

## 2018-02-07 ENCOUNTER — Ambulatory Visit: Payer: Medicare Other | Admitting: Family Medicine

## 2018-02-07 ENCOUNTER — Encounter: Payer: Self-pay | Admitting: Family Medicine

## 2018-02-07 VITALS — BP 118/80 | HR 76 | Temp 97.7°F | Ht 61.0 in | Wt 251.3 lb

## 2018-02-07 DIAGNOSIS — Z794 Long term (current) use of insulin: Secondary | ICD-10-CM

## 2018-02-07 DIAGNOSIS — I152 Hypertension secondary to endocrine disorders: Secondary | ICD-10-CM

## 2018-02-07 DIAGNOSIS — E039 Hypothyroidism, unspecified: Secondary | ICD-10-CM | POA: Diagnosis not present

## 2018-02-07 DIAGNOSIS — F330 Major depressive disorder, recurrent, mild: Secondary | ICD-10-CM | POA: Diagnosis not present

## 2018-02-07 DIAGNOSIS — I10 Essential (primary) hypertension: Secondary | ICD-10-CM | POA: Diagnosis not present

## 2018-02-07 DIAGNOSIS — E1159 Type 2 diabetes mellitus with other circulatory complications: Secondary | ICD-10-CM | POA: Diagnosis not present

## 2018-02-07 LAB — CBC
HEMATOCRIT: 37.7 % (ref 36.0–46.0)
Hemoglobin: 12.4 g/dL (ref 12.0–15.0)
MCHC: 33 g/dL (ref 30.0–36.0)
MCV: 85.7 fl (ref 78.0–100.0)
PLATELETS: 281 10*3/uL (ref 150.0–400.0)
RBC: 4.4 Mil/uL (ref 3.87–5.11)
RDW: 14.6 % (ref 11.5–15.5)
WBC: 4.7 10*3/uL (ref 4.0–10.5)

## 2018-02-07 LAB — BASIC METABOLIC PANEL
BUN: 19 mg/dL (ref 6–23)
CHLORIDE: 104 meq/L (ref 96–112)
CO2: 30 meq/L (ref 19–32)
Calcium: 9.1 mg/dL (ref 8.4–10.5)
Creatinine, Ser: 0.82 mg/dL (ref 0.40–1.20)
GFR: 86.74 mL/min (ref >60.00)
Glucose, Bld: 138 mg/dL — ABNORMAL HIGH (ref 70–99)
Potassium: 4.1 mEq/L (ref 3.5–5.1)
SODIUM: 141 meq/L (ref 135–145)

## 2018-02-07 LAB — HEMOGLOBIN A1C: HEMOGLOBIN A1C: 8.8 % — AB (ref 4.6–6.5)

## 2018-02-07 LAB — TSH: TSH: 4.24 u[IU]/mL (ref 0.35–4.50)

## 2018-02-07 MED ORDER — TRIAMCINOLONE ACETONIDE 0.1 % EX CREA: 1.0000 "application " | TOPICAL_CREAM | Freq: Two times a day (BID) | CUTANEOUS | 0 refills | Status: DC

## 2018-02-07 NOTE — Progress Notes (Signed)
HPI:  Katie Mcintyre is a pleasant 77 y.o. here for follow up. Chronic medical problems summarized below were reviewed for changes and stability and were updated as needed below. These issues and their treatment remain stable for the most part.  She has a history of poor compliance.  Diabetes was not well controlled at her last check in November.  We advised increasing the insulin, monitoring blood sugars and calling us so that we could titrate further.  Reports insulin dose currently is 13 units twice daily.  Average fasting blood sugars are 170.  No low blood sugars.  She did not increase the insulin.  Poor diet and no regular exercise. She has an itchy rash on her left lower leg and she has been scrubbing this with Dial soap and this has not seem to have helped.  She has compression socks, but is not wearing them.  He does get pains in her legs when she walks any distance tired feeling.  Reports her cardiologist is called her several times to tell her heart rate was a little elevated, but but that the doctor thought it was okay.  She has not had a follow-up with them in some time. Denies hypoglycemia, CP, SOB, DOE, treatment intolerance or new symptoms.   Annual wellness visit was 11/06/17  DM: -circulatory complications -meds: metformin, asa, losartan, mixed humolog 75/25 13units bid -switched to mixed insulin 04/2015 -Opthomologist: seesophthalmology every 4 months per her report -podiatrist is Dr. Amalia Hailey  HLD/Obesity: -meds simvastatin 20 -poor lifestyle  HTN/Chronic LE edema: -meds: isosorbide, hydralazine, clonidine, metoprolol, losartan, norvasc -chronic LE edema intermittently - non-compliant with compression  Hypothyroid:  -on levothyroxine  Anemia:  -on iron therapy from prior PCP Had colonoscopy with polpys, repeat advised in 08/2016 -she has not done this, though we have advised her to follow-up with her gastroenterologist about this.  Hx Gout: -on allopurinol  from prior PCP  H ofDepression: resolved -meds: none currently  Hx Pre-syncope/AMS events: -s/p eval with neurology and seeing cardiology in 2017 with Echo, has implanted loop recorder -on review OSH records hx abnormal EKG,LAFB in the past per notes  Chronic back pain/lumbar radiculopathy and spinal stenosis: -seeing Dr. Marcos Eke at [facility] for this -walks with cane  ROS: See pertinent positives and negatives per HPI.  Past Medical History:  Diagnosis Date  . Anemia   . Arthritis   . Cancer ([place])    cervical  . Cataract    removed both eyes with lens implant both   . Chicken pox   . Chronic bronchitis ([employer])   . Colon polyp   . Depression 06/30/2013  . Diabetes mellitus without complication ([place])   . Diverticulitis   . Frequent headaches   . GERD (gastroesophageal reflux disease)   . Gout 06/30/2013  . Hyperlipidemia   . Hypertension   . S/P epidural steroid injection 09/27/2016   done in back at [place]   . Spinal stenosis of lumbar region    s/p spine surgery 02/12/13 with Dr Marcos Eke at [facility]  . Thyroid disease   . Urine incontinence   . Use of cane as ambulatory aid     Past Surgical History:  Procedure Laterality Date  . ABDOMINAL HYSTERECTOMY  1970   complete hysterectomy for cervical cancer  . BACK SURGERY  01/2013  . BACK SURGERY  1995  . CATARACT EXTRACTION, BILATERAL     with lens implant   . CERVICAL CONE BIOPSY    . COLONOSCOPY    .  EP IMPLANTABLE DEVICE N/A 02/15/2016   Procedure: Loop Recorder Insertion;  Surgeon: Sanda Klein, MD;  Location: [place] [facility];  Service: Cardiovascular;  Laterality: N/A;  . POLYPECTOMY    . WRIST SURGERY  2010   for wrist fracture    Family History  Problem Relation Age of Onset  . Alcohol abuse Father   . Cirrhosis Father   . Heart attack Brother   . Breast cancer Sister   . Cancer Mother        renal cancer  . Stroke Paternal Grandmother   . Alcohol abuse Brother   . Cancer Sister        renal cancer   . Diabetes Brother   . Heart Problems Daughter   . Healthy Daughter   . Healthy Daughter   . Healthy Son   . Healthy Son   . Arthritis Unknown        parents  . Hyperlipidemia Unknown        parent  . Hypertension Unknown        parent/grandparent  . Diabetes Unknown        parent/grandparent  . Heart disease Other   . Mental illness Other   . Colon cancer Neg Hx   . Esophageal cancer Neg Hx   . Prostate cancer Neg Hx   . Rectal cancer Neg Hx   . Seizures Neg Hx   . Colon polyps Neg Hx     Social History   Socioeconomic History  . Marital status: Divorced    Spouse name: None  . Number of children: 5  . Years of education: 55  . Highest education level: None  Social Needs  . Financial resource strain: None  . Food insecurity - worry: None  . Food insecurity - inability: None  . Transportation needs - medical: None  . Transportation needs - non-medical: None  Occupational History  . Occupation: Retired  Tobacco Use  . Smoking status: Former Smoker    Last attempt to quit: 12/25/1964    Years since quitting: 53.1  . Smokeless tobacco: Never Used  . Tobacco comment: smoked 6 years / when she was in college   Substance and Sexual Activity  . Alcohol use: Yes    Comment: occ with steak dinner   . Drug use: No  . Sexual activity: None  Other Topics Concern  . 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  ACCU-CHEK AVIVA PLUS test strip, USE AS INSTRUCTED TO CHECK BLOOD SUGAR TWICE A DAY * E11.9*, Disp: 100 each, Rfl: 5 .  ACCU-CHEK FASTCLIX LANCETS MISC, USE AS DIRECTED, Disp: 102 each, Rfl: 3 .  acetaminophen (TYLENOL) 500 MG tablet, Take 1,000 mg by mouth every 8  (eight) hours as needed., Disp: , Rfl:  .  allopurinol (ZYLOPRIM) 100 MG tablet, Take 1 tablet (100 mg total) by mouth daily., Disp: 90 tablet, Rfl: 3 .  amLODipine (NORVASC) 5 MG tablet, TAKE 1 TABLET BY MOUTH ONCE DAILY, Disp: 90 tablet, Rfl: 1 .  aspirin 81 MG tablet, Take 81  mg by mouth daily., Disp: , Rfl:  .  B-D UF III MINI PEN NEEDLES 31G X 5 MM MISC, USE TO INJECT INSULIN TWICE A DAY, Disp: 100 each, Rfl: 3 .  Blood Glucose Calibration (ACCU-CHEK AVIVA) SOLN, Use as directed., Disp: 1 each, Rfl: 1 .  calcium-vitamin D (OSCAL 500/200 D-3) 500-200 MG-UNIT per tablet, Take 1 tablet by mouth daily., Disp: , Rfl:  .  cloNIDine (CATAPRES) 0.1 MG tablet, TAKE 1 TABLET BY MOUTH TWICE A DAY, Disp: 180 tablet, Rfl: 1 .  ferrous sulfate 325 (65 FE) MG tablet, Take 325 mg by mouth daily with breakfast., Disp: , Rfl:  .  HUMALOG MIX 75/25 KWIKPEN (75-25) 100 UNIT/ML Kwikpen, INJECT 10 UNITS INTO THE SKIN 2 (TWO) TIMES DAILY BEFORE A MEAL., Disp: 15 pen, Rfl: 4 .  hydrALAZINE (APRESOLINE) 25 MG tablet, TAKE 1 TABLET (25 MG TOTAL) BY MOUTH 2 (TWO) TIMES DAILY., Disp: 180 tablet, Rfl: 1 .  isosorbide dinitrate (ISORDIL) 20 MG tablet, TAKE 1 TABLET (20 MG TOTAL) BY MOUTH 2 (TWO) TIMES DAILY., Disp: 60 tablet, Rfl: 5 .  Lancet Devices (ACCU-CHEK SOFTCLIX) lancets, Use as instructed twice a day, Disp: 1 each, Rfl: 5 .  levothyroxine (SYNTHROID, LEVOTHROID) 125 MCG tablet, TAKE 1 TABLET BY MOUTH EVERY DAY, Disp: 90 tablet, Rfl: 1 .  losartan (COZAAR) 50 MG tablet, TAKE 1 TABLET BY MOUTH ONCE DAILY, Disp: 90 tablet, Rfl: 1 .  metFORMIN (GLUCOPHAGE) 1000 MG tablet, TAKE 1 TABLET BY MOUTH TWICE DAILY WITH MEALS, Disp: 180 tablet, Rfl: 1 .  Sennosides-Docusate Sodium (STOOL SOFTENER & LAXATIVE PO), Take 1 tablet by mouth daily as needed (constipation). , Disp: , Rfl:  .  simvastatin (ZOCOR) 20 MG tablet, TAKE 1 TABLET BY MOUTH EVERY DAY, Disp: 90 tablet, Rfl: 1 .  vitamin B-12 (CYANOCOBALAMIN) 1000 MCG tablet,  Take 1,000 mcg by mouth daily., Disp: , Rfl:  .  triamcinolone cream (KENALOG) 0.1 %, Apply 1 application topically 2 (two) times daily., Disp: 30 g, Rfl: 0  Current Facility-Administered Medications:  .  ipratropium-albuterol (DUONEB) 0.5-2.5 (3) MG/3ML nebulizer solution 3 mL, 3 mL, Nebulization, Once, Kordsmeier, Julia, FNP  EXAM:  Vitals:   02/07/18 1331  BP: 118/80  Pulse: 76  Temp: 97.7 F (36.5 C)    Body mass index is 47.48 kg/m.  GENERAL: vitals reviewed and listed above, alert, oriented, appears well hydrated and in no acute distress  HEENT: atraumatic, conjunttiva clear, no obvious abnormalities on inspection of external nose and ears  NECK: no obvious masses on inspection  LUNGS: clear to auscultation bilaterally, no wheezes, rales or rhonchi, good air movement  CV: HRRR, woody edema bilat LE, no signs infection or cellulitis  MS: moves all extremities without noticeable abnormality  SKIN: few erythematous papules with excoriation L LE  PSYCH: pleasant and cooperative, no obvious depression or anxiety  ASSESSMENT AND PLAN:  Discussed the following assessment and plan:  Type 2 diabetes mellitus with other circulatory complication, with long-term current use of insulin (HCC) - Plan: Hemoglobin A1c  Morbid obesity (HCC)  Hypothyroidism, unspecified type - Plan: TSH  Hypertension associated with diabetes ([place]) - Plan: Basic metabolic panel, CBC  Mild episode of recurrent major depressive disorder (HCC)  -skin rash appear eczematous and from trauma (scratching) - see pt instructions for tx, only gentle cleansing, good emollient, steroid cr -advise it will be very important to get her diabetes under control, advised increasing her insulin, healthy diet avoidance of sweets and simple starches,  regular exercise and close follow-up.  Advised that she call us in 1 week with her fasting blood sugar so we can titrate her insulin further.  Advised if  diabetes does  not improve at her next 38-month visit we will have her see the endocrinologist to see if they can help further. -Advised her to schedule follow-up with her cardiologist.  Advised she let him know about her leg symptoms so she can be evaluated for PAD. -Advised weight reduction, healthy diet and regular activity -Patient advised to return or notify a doctor immediately if symptoms worsen or persist or new concerns arise.  Patient Instructions  BEFORE YOU LEAVE: -please update insulin dose to 15 units and notify pharmacy - send refills. -labs -follow up: 3 months  Increase insulin to 15 units. Call us in 1 week with sugar log.  Healthy low sugar diet and regular aerobic exercise.  Please schedule follow-up with your cardiologist and let them know about the leg issues you are having.  For the skin.  Do not scrub this area.  Please use Aquaphor or Vaseline mixed with the triamcinolone cream that I sent to your pharmacy twice daily.  Do not scratch.  Follow-up if this worsens or does not resolve.  Please use your compression socks.   We recommend the following healthy lifestyle for LIFE: 1) Small portions. But, make sure to get regular (at least 3 per day), healthy meals and small healthy snacks if needed.  2) Eat a healthy clean diet.   TRY TO EAT: -at least 5-7 servings of low sugar, colorful, and nutrient rich vegetables per day (not corn, potatoes or bananas.) -berries are the best choice if you wish to eat fruit (only eat small amounts if trying to reduce weight)  -lean meets (fish, white meat of chicken or [place]) -vegan proteins for some meals - beans or tofu, whole grains, nuts and seeds -Replace bad fats with good fats - good fats include: fish, nuts and seeds, canola oil, olive oil -small amounts of low fat or non fat dairy -small amounts of100 % whole grains - check the lables -drink plenty of water  AVOID: -SUGAR, sweets, anything with added sugar, corn syrup or sweeteners  - must read labels as even foods advertised as "healthy" often are loaded with sugar -if you must have a sweetener, small amounts of stevia may be best -sweetened beverages and artificially sweetened beverages -simple starches (rice, bread, potatoes, pasta, chips, etc - small amounts of 100% whole grains are ok) -red meat, pork, butter -fried foods, fast food, processed food, excessive dairy, eggs and coconut.  3)Get at least 150 minutes of sweaty aerobic exercise per week.  4)Reduce stress - consider counseling, meditation and relaxation to balance other aspects of your life.    [name], DO

## 2018-02-07 NOTE — Patient Instructions (Addendum)
BEFORE YOU LEAVE: -please update insulin dose to 15 units and notify pharmacy - send refills. -labs -follow up: 3 months  Increase insulin to 15 units. Call us in 1 week with sugar log.  Healthy low sugar diet and regular aerobic exercise.  Please schedule follow-up with your cardiologist and let them know about the leg issues you are having.  For the skin.  Do not scrub this area.  Please use Aquaphor or Vaseline mixed with the triamcinolone cream that I sent to your pharmacy twice daily.  Do not scratch.  Follow-up if this worsens or does not resolve.  Please use your compression socks.   We recommend the following healthy lifestyle for LIFE: 1) Small portions. But, make sure to get regular (at least 3 per day), healthy meals and small healthy snacks if needed.  2) Eat a healthy clean diet.   TRY TO EAT: -at least 5-7 servings of low sugar, colorful, and nutrient rich vegetables per day (not corn, potatoes or bananas.) -berries are the best choice if you wish to eat fruit (only eat small amounts if trying to reduce weight)  -lean meets (fish, white meat of chicken or [place]) -vegan proteins for some meals - beans or tofu, whole grains, nuts and seeds -Replace bad fats with good fats - good fats include: fish, nuts and seeds, canola oil, olive oil -small amounts of low fat or non fat dairy -small amounts of100 % whole grains - check the lables -drink plenty of water  AVOID: -SUGAR, sweets, anything with added sugar, corn syrup or sweeteners - must read labels as even foods advertised as "healthy" often are loaded with sugar -if you must have a sweetener, small amounts of stevia may be best -sweetened beverages and artificially sweetened beverages -simple starches (rice, bread, potatoes, pasta, chips, etc - small amounts of 100% whole grains are ok) -red meat, pork, butter -fried foods, fast food, processed food, excessive dairy, eggs and coconut.  3)Get at least 150 minutes of  sweaty aerobic exercise per week.  4)Reduce stress - consider counseling, meditation and relaxation to balance other aspects of your life.

## 2018-02-08 MED ORDER — INSULIN LISPRO PROT & LISPRO (75-25 MIX) 100 UNIT/ML KWIKPEN: 15.0000 [IU] | PEN_INJECTOR | Freq: Two times a day (BID) | SUBCUTANEOUS | 4 refills | Status: DC

## 2018-02-08 NOTE — Addendum Note (Signed)
Addended by: Agnes Lawrence on: 02/08/2018 08:13 AM   Modules accepted: Orders

## 2018-02-11 ENCOUNTER — Other Ambulatory Visit: Payer: Self-pay | Admitting: Family Medicine

## 2018-02-12 ENCOUNTER — Encounter: Payer: Self-pay | Admitting: Internal Medicine

## 2018-02-12 ENCOUNTER — Ambulatory Visit (AMBULATORY_SURGERY_CENTER): Payer: Self-pay

## 2018-02-12 VITALS — Ht 61.0 in | Wt 250.2 lb

## 2018-02-12 DIAGNOSIS — Z86010 Personal history of colonic polyps: Secondary | ICD-10-CM

## 2018-02-12 MED ORDER — NA SULFATE-K SULFATE-MG SULF 17.5-3.13-1.6 GM/177ML PO SOLN: 1.0000 | Freq: Once | ORAL | 0 refills | Status: AC

## 2018-02-12 NOTE — Progress Notes (Signed)
Per pt, no allergies to soy or egg products.Pt not taking any weight loss meds or using  O2 at home.  Pt refused emmi video. 

## 2018-02-18 ENCOUNTER — Ambulatory Visit: Payer: Medicare Other | Admitting: Podiatry

## 2018-02-18 ENCOUNTER — Encounter: Payer: Self-pay | Admitting: Podiatry

## 2018-02-18 DIAGNOSIS — M79609 Pain in unspecified limb: Secondary | ICD-10-CM | POA: Diagnosis not present

## 2018-02-18 DIAGNOSIS — M2040 Other hammer toe(s) (acquired), unspecified foot: Secondary | ICD-10-CM

## 2018-02-18 DIAGNOSIS — E1142 Type 2 diabetes mellitus with diabetic polyneuropathy: Secondary | ICD-10-CM

## 2018-02-18 DIAGNOSIS — B351 Tinea unguium: Secondary | ICD-10-CM

## 2018-02-19 ENCOUNTER — Telehealth: Payer: Self-pay | Admitting: *Deleted

## 2018-02-19 NOTE — Telephone Encounter (Signed)
Spoke with patient to request a manual transmission for review of "AF" episodes that have not transmitted automatically.  Patient requests a call back in 20 minutes as she is at the bank.

## 2018-02-19 NOTE — Progress Notes (Signed)
Subjective:   Patient ID: Katie Mcintyre, female   DOB: 77 y.o.   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

## 2018-02-19 NOTE — Telephone Encounter (Signed)
Spoke with patient, assisted her with sending a manual transmission for review.  Advised I will review the transmission when it is received and call her back if any abnormalities noted.  Patient is appreciative and denies additional questions or concerns at this time.

## 2018-02-20 NOTE — Telephone Encounter (Signed)
Manual transmission received on 02/19/18.  All "AF" episodes appear false, ECGs suggest SR/ST w/PACs and rare PVCs.  All available ECGs printed and placed in Dr. Langley Gauss folder for review.

## 2018-02-26 ENCOUNTER — Ambulatory Visit (AMBULATORY_SURGERY_CENTER): Payer: Medicare Other | Admitting: Internal Medicine

## 2018-02-26 ENCOUNTER — Encounter: Payer: Self-pay | Admitting: Internal Medicine

## 2018-02-26 ENCOUNTER — Other Ambulatory Visit: Payer: Self-pay

## 2018-02-26 VITALS — BP 192/95 | HR 79 | Temp 97.8°F | Resp 11 | Ht 61.0 in | Wt 250.0 lb

## 2018-02-26 DIAGNOSIS — Z86010 Personal history of colonic polyps: Secondary | ICD-10-CM

## 2018-02-26 DIAGNOSIS — D123 Benign neoplasm of transverse colon: Secondary | ICD-10-CM | POA: Diagnosis not present

## 2018-02-26 DIAGNOSIS — D120 Benign neoplasm of cecum: Secondary | ICD-10-CM | POA: Diagnosis not present

## 2018-02-26 MED ORDER — SODIUM CHLORIDE 0.9 % IV SOLN: 500.0000 mL | Freq: Once | INTRAVENOUS | Status: AC

## 2018-02-26 NOTE — Progress Notes (Signed)
Called to room to assist during endoscopic procedure.  Patient ID and intended procedure confirmed with present staff. Received instructions for my participation in the procedure from the performing physician.  

## 2018-02-26 NOTE — Op Note (Signed)
[facility] Patient Name: Katie Mcintyre Procedure Date: 02/26/2018 11:19 AM MRN: [id number] Endoscopist: Jerene Bears , MD Age: 77 Referring MD:  Date of Birth: Sep 24, 1941 Gender: Female Account #: [id number] Procedure:                Colonoscopy Indications:              High risk colon cancer surveillance: Personal                            history of multiple (3 or more) adenomas, Last                            colonoscopy: September 2014 Medicines:                Monitored Anesthesia Care Procedure:                Pre-Anesthesia Assessment:                           - Prior to the procedure, a History and Physical                            was performed, and patient medications and                            allergies were reviewed. The patient's tolerance of                            previous anesthesia was also reviewed. The risks                            and benefits of the procedure and the sedation                            options and risks were discussed with the patient.                            All questions were answered, and informed consent                            was obtained. Prior Anticoagulants: The patient has                            taken no previous anticoagulant or antiplatelet                            agents. ASA Grade Assessment: III - A patient with                            severe systemic disease. After reviewing the risks                            and benefits, the patient was deemed in  satisfactory condition to undergo the procedure.                           After obtaining informed consent, the colonoscope                            was passed under direct vision. Throughout the                            procedure, the patient's blood pressure, pulse, and                            oxygen saturations were monitored continuously. The                            Colonoscope was introduced through the  anus and                            advanced to the the cecum, identified by                            appendiceal orifice and ileocecal valve. The                            colonoscopy was performed without difficulty. The                            patient tolerated the procedure well. The quality                            of the bowel preparation was good. The ileocecal                            valve, appendiceal orifice, and rectum were                            photographed. Scope In: 11:30:07 AM Scope Out: 11:49:00 AM Scope Withdrawal Time: 0 hours 13 minutes 58 seconds  Total Procedure Duration: 0 hours 18 minutes 53 seconds  Findings:                 The digital rectal exam was normal.                           A 9 mm polyp was found in the ileocecal valve. The                            polyp was sessile. The polyp was removed with a hot                            snare. Resection and retrieval were complete.                           A 4 mm polyp was found in the transverse colon. The  polyp was sessile. The polyp was removed with a                            cold snare. Resection and retrieval were complete.                           A few small-mouthed diverticula were found in the                            sigmoid colon.                           The retroflexed view of the distal rectum and anal                            verge was normal and showed no anal or rectal                            abnormalities. Complications:            No immediate complications. Estimated Blood Loss:     Estimated blood loss was minimal. Impression:               - One 9 mm polyp at the ileocecal valve, removed                            with a hot snare. Resected and retrieved.                           - One 4 mm polyp in the transverse colon, removed                            with a cold snare. Resected and retrieved.                           -  Diverticulosis in the sigmoid colon.                           - The distal rectum and anal verge are normal on                            retroflexion view. Recommendation:           - Patient has a contact number available for                            emergencies. The signs and symptoms of potential                            delayed complications were discussed with the                            patient. Return to normal activities tomorrow.  Written discharge instructions were provided to the                            patient.                           - Resume previous diet.                           - Continue present medications.                           - Await pathology results.                           - No recommendation at this time regarding repeat                            colonoscopy.                           - No ibuprofen, naproxen, or other non-steroidal                            anti-inflammatory drugs for 2 weeks after polyp                            removal. Jerene Bears, MD 02/26/2018 11:54:01 AM This report has been signed electronically.

## 2018-02-26 NOTE — Patient Instructions (Signed)
**  Handouts given on Polyps and  Diverticulosis**   YOU HAD AN ENDOSCOPIC PROCEDURE TODAY: Refer to the procedure report and other information in the discharge instructions given to you for any specific questions about what was found during the examination. If this information does not answer your questions, please call [facility] office at [phone] to clarify.   YOU SHOULD EXPECT: Some feelings of bloating in the abdomen. Passage of more gas than usual. Walking can help get rid of the air that was put into your GI tract during the procedure and reduce the bloating. If you had a lower endoscopy (such as a colonoscopy or flexible sigmoidoscopy) you may notice spotting of blood in your stool or on the toilet paper. Some abdominal soreness may be present for a day or two, also.  DIET: Your first meal following the procedure should be a light meal and then it is ok to progress to your normal diet. A half-sandwich or bowl of soup is an example of a good first meal. Heavy or fried foods are harder to digest and may make you feel nauseous or bloated. Drink plenty of fluids but you should avoid alcoholic beverages for 24 hours. If you had a esophageal dilation, please see attached instructions for diet.    ACTIVITY: Your care partner should take you home directly after the procedure. You should plan to take it easy, moving slowly for the rest of the day. You can resume normal activity the day after the procedure however YOU SHOULD NOT DRIVE, use power tools, machinery or perform tasks that involve climbing or major physical exertion for 24 hours (because of the sedation medicines used during the test).   SYMPTOMS TO REPORT IMMEDIATELY: A gastroenterologist can be reached at any hour. Please call [phone]  for any of the following symptoms:  Following lower endoscopy (colonoscopy, flexible sigmoidoscopy) Excessive amounts of blood in the stool  Significant tenderness, worsening of abdominal pains   Swelling of the abdomen that is new, acute  Fever of 100 or higher    FOLLOW UP:  If any biopsies were taken you will be contacted by phone or by letter within the next 1-3 weeks. Call [phone]  if you have not heard about the biopsies in 3 weeks.  Please also call with any specific questions about appointments or follow up tests.

## 2018-02-26 NOTE — Progress Notes (Signed)
Report to PACU, RN, vss, BBS= Clear.  

## 2018-02-26 NOTE — Progress Notes (Signed)
Pt's states no medical or surgical changes since previsit or office visit. 

## 2018-02-26 NOTE — Progress Notes (Signed)
Pt. Has been coughing stated have had "about a day or two  States she" don't think she has had a fever".

## 2018-02-27 ENCOUNTER — Telehealth: Payer: Self-pay | Admitting: *Deleted

## 2018-02-27 NOTE — Progress Notes (Signed)
HPI:  Acute visit for cough and congestion: -Started 4-5 days ago -Symptoms include: Nasal congestion, postnasal drip, cough, soreness from coughing/body aches, chills -Denies shortness of breath, vomiting, fevers, diarrhea -Blood pressure has been running up, reports this was up at her colonoscopy earlier this week   ROS: See pertinent positives and negatives per HPI.  Past Medical History:  Diagnosis Date  . Anemia   . Arthritis   . Cancer ([place]) 1969   cervical  . Cataract    removed both eyes with lens implant both   . Chicken pox   . Chronic bronchitis ([place])   . Colon polyp   . Depression 06/30/2013  . Diabetes mellitus without complication ([place])   . Diverticulitis   . Frequent headaches   . GERD (gastroesophageal reflux disease)   . Gout 06/30/2013  . Hx of dizziness   . Hyperlipidemia   . Hypertension   . Post-operative nausea and vomiting   . S/P epidural steroid injection 09/27/2016   done in back at [facility]   . Spinal stenosis of lumbar region    s/p spine surgery 02/12/13 with Dr Marcos Eke at [facility]  . Thyroid disease   . Urine incontinence   . Use of cane as ambulatory aid     Past Surgical History:  Procedure Laterality Date  . ABDOMINAL HYSTERECTOMY  1970   complete hysterectomy for cervical cancer  . BACK SURGERY  01/2013  . BACK SURGERY  1995  . CATARACT EXTRACTION, BILATERAL     with lens implant   . CERVICAL CONE BIOPSY  1970  . COLONOSCOPY    . EP IMPLANTABLE DEVICE N/A 02/15/2016   Procedure: Loop Recorder Insertion;  Surgeon: Sanda Klein, MD;  Location: [facility];  Service: Cardiovascular;  Laterality: N/A;  . POLYPECTOMY    . WRIST SURGERY  2010   for wrist fracture    Family History  Problem Relation Age of Onset  . Alcohol abuse Father   . Cirrhosis Father   . Heart attack Brother   . Breast cancer Sister   . Cancer Mother        renal cancer  . Stroke Paternal Grandmother   . Alcohol abuse Brother   . Cancer Sister    renal cancer  . Diabetes Brother   . Heart Problems Daughter   . Healthy Daughter   . Healthy Daughter   . Healthy Son   . Healthy Son   . Arthritis Unknown        parents  . Hyperlipidemia Unknown        parent  . Hypertension Unknown        parent/grandparent  . Diabetes Unknown        parent/grandparent  . Heart disease Other   . Mental illness Other   . Colon cancer Neg Hx   . Esophageal cancer Neg Hx   . Prostate cancer Neg Hx   . Rectal cancer Neg Hx   . Seizures Neg Hx   . Colon polyps Neg Hx     Social History   Socioeconomic History  . Marital status: Divorced    Spouse name: None  . Number of children: 5  . Years of education: 42  . Highest education level: None  Social Needs  . Financial resource strain: None  . Food insecurity - worry: None  . Food insecurity - inability: None  . Transportation needs - medical: None  . Transportation needs - non-medical: None  Occupational History  .  Occupation: Retired  Tobacco Use  . Smoking status: Former Smoker    Last attempt to quit: 12/25/1964    Years since quitting: 53.2  . Smokeless tobacco: Never Used  . Tobacco comment: smoked 6 years / when she was in college   Substance and Sexual Activity  . Alcohol use: Yes    Comment: occ with steak dinner   . Drug use: No  . Sexual activity: None  Other Topics Concern  . None  Social History Narrative   Work or School: retired Nurse, children's Situation: lives with son in [place]: exercising on regular visit; trying to work on diet            Epworth Sleepiness Scale = 20 (as of 02/02/2016)           Current Outpatient Medications:  .  ACCU-CHEK AVIVA PLUS test strip, USE AS INSTRUCTED TO CHECK BLOOD SUGAR TWICE A DAY * E11.9*, Disp: 100 each, Rfl: 5 .  ACCU-CHEK FASTCLIX LANCETS MISC, USE AS DIRECTED, Disp: 102 each, Rfl: 3 .  acetaminophen (TYLENOL) 500 MG tablet, Take 1,000 mg by  mouth every 8 (eight) hours as needed., Disp: , Rfl:  .  allopurinol (ZYLOPRIM) 100 MG tablet, Take 1 tablet (100 mg total) by mouth daily., Disp: 90 tablet, Rfl: 3 .  amLODipine (NORVASC) 5 MG tablet, TAKE 1 TABLET BY MOUTH ONCE DAILY, Disp: 90 tablet, Rfl: 1 .  aspirin 81 MG tablet, Take 81 mg by mouth daily., Disp: , Rfl:  .  B-D UF III MINI PEN NEEDLES 31G X 5 MM MISC, USE TO INJECT INSULIN TWICE A DAY, Disp: 100 each, Rfl: 3 .  Blood Glucose Calibration (ACCU-CHEK AVIVA) SOLN, Use as directed., Disp: 1 each, Rfl: 1 .  calcium-vitamin D (OSCAL 500/200 D-3) 500-200 MG-UNIT per tablet, Take 1 tablet by mouth daily., Disp: , Rfl:  .  cloNIDine (CATAPRES) 0.1 MG tablet, TAKE 1 TABLET BY MOUTH TWICE A DAY, Disp: 180 tablet, Rfl: 1 .  ferrous sulfate 325 (65 FE) MG tablet, Take 325 mg by mouth daily with breakfast., Disp: , Rfl:  .  hydrALAZINE (APRESOLINE) 25 MG tablet, TAKE 1 TABLET (25 MG TOTAL) BY MOUTH 2 (TWO) TIMES DAILY., Disp: 180 tablet, Rfl: 1 .  Insulin Lispro Prot & Lispro (HUMALOG MIX 75/25 KWIKPEN) (75-25) 100 UNIT/ML Kwikpen, Inject 15 Units into the skin 2 (two) times daily before a meal., Disp: 15 pen, Rfl: 4 .  isosorbide dinitrate (ISORDIL) 20 MG tablet, TAKE 1 TABLET (20 MG TOTAL) BY MOUTH 2 (TWO) TIMES DAILY., Disp: 60 tablet, Rfl: 5 .  Lancet Devices (ACCU-CHEK SOFTCLIX) lancets, Use as instructed twice a day, Disp: 1 each, Rfl: 5 .  levothyroxine (SYNTHROID, LEVOTHROID) 125 MCG tablet, TAKE 1 TABLET BY MOUTH EVERY DAY, Disp: 90 tablet, Rfl: 1 .  losartan (COZAAR) 50 MG tablet, TAKE 1 TABLET BY MOUTH ONCE DAILY, Disp: 90 tablet, Rfl: 1 .  metFORMIN (GLUCOPHAGE) 1000 MG tablet, TAKE 1 TABLET BY MOUTH TWICE DAILY WITH MEALS, Disp: 180 tablet, Rfl: 1 .  Sennosides-Docusate Sodium (STOOL SOFTENER & LAXATIVE PO), Take 1 tablet by mouth daily as needed (constipation). , Disp: , Rfl:  .  simvastatin (ZOCOR) 20 MG tablet, TAKE 1 TABLET BY MOUTH EVERY DAY, Disp: 90 tablet, Rfl: 1 .   triamcinolone cream (KENALOG) 0.1 %, Apply 1 application topically 2 (two) times daily.,  Disp: 30 g, Rfl: 0 .  vitamin B-12 (CYANOCOBALAMIN) 1000 MCG tablet, Take 1,000 mcg by mouth daily., Disp: , Rfl:   Current Facility-Administered Medications:  .  0.9 %  sodium chloride infusion, 500 mL, Intravenous, Once, Pyrtle, Lajuan Lines, MD .  ipratropium-albuterol (DUONEB) 0.5-2.5 (3) MG/3ML nebulizer solution 3 mL, 3 mL, Nebulization, Once, Delano Metz, FNP  EXAM:  Vitals:   02/28/18 0859 02/28/18 0903  BP: (!) 160/80 (!) 174/82  Pulse: 82   Temp: 98.6 F (37 C)   SpO2: 97%     Body mass index is 46.31 kg/m.  GENERAL: vitals reviewed and listed above, alert, oriented, appears well hydrated and in no acute distress  HEENT: atraumatic, conjunttiva clear, no obvious abnormalities on inspection of external nose and ears, normal appearance of ear canals and TMs, clear nasal congestion, mild post oropharyngeal erythema with PND, no tonsillar edema or exudate, no sinus TTP  NECK: no obvious masses on inspection  LUNGS: clear to auscultation bilaterally, no wheezes, rales or rhonchi, good air movement  CV: HRRR, no peripheral edema  MS: moves all extremities without noticeable abnormality  PSYCH: pleasant and cooperative, no obvious depression or anxiety  ASSESSMENT AND PLAN:  Discussed the following assessment and plan:  Respiratory illness  Hypertension associated with diabetes ([place])  -We will get a chest x-ray given the chills and bad cough to exclude lower respiratory tract infection -Suspect viral upper respiratory illness, versus mild influenza versus other -Flu swab, discussed Tamiflu, would not treat with Tamiflu given time and does feel risk would outweigh benefits and patient agrees -Tessalon for cough (proper use and risks discussed) and home symptomatic care -discussed she reports her blood sugars are doing  okay at home Advised prompt follow-up if any worsening or not  improving as expected -For the blood pressure, she has a complicated history and will schedule follow-up with her cardiologist, in the interim discussed various options and she decided to try to increase the Norvasc to 7.5 mg daily.  She agrees to call her cardiologist today for an appointment.  -Patient advised to return or notify a doctor immediately if symptoms worsen or persist or new concerns arise.  Patient Instructions  BEFORE YOU LEAVE: -Flu swab -X-ray she -follow up: As scheduled for follow-up in May, sooner as needed  Go get the chest x-ray today  You can use Tessalon for the cough  Call your cardiologist today about an appointment  In the interim, increase your Norvasc to 1 and 1/2 tablets, 7.5 mg daily  I hope you are feeling better soon! Seek care promptly if your symptoms worsen, new concerns arise or you are not improving with treatment.      [name], DO

## 2018-02-27 NOTE — Telephone Encounter (Signed)
  Follow up Call-  Call back number 02/26/2018  Post procedure Call Back phone  # [phone]  Permission to leave phone message (No Data)  comments UNSURE IF VOICEMAIL WORKING IF WORKING CAN LEAVE MESSAGE  Some recent data might be hidden     Patient questions:  Do you have a fever, pain , or abdominal swelling? No. Pain Score  0 *  Have you tolerated food without any problems? Yes.    Have you been able to return to your normal activities? Yes.    Do you have any questions about your discharge instructions: Diet   No. Medications  No. Follow up visit  No.  Do you have questions or concerns about your Care? No.  Actions: * If pain score is 4 or above: No action needed, pain <4.

## 2018-02-28 ENCOUNTER — Ambulatory Visit (INDEPENDENT_AMBULATORY_CARE_PROVIDER_SITE_OTHER)
Admission: RE | Admit: 2018-02-28 | Discharge: 2018-02-28 | Disposition: A | Discharge status: Home / Self Care | Payer: Medicare Other | Source: Ambulatory Visit | Attending: Family Medicine | Admitting: Family Medicine

## 2018-02-28 ENCOUNTER — Ambulatory Visit: Payer: Medicare Other | Admitting: Family Medicine

## 2018-02-28 ENCOUNTER — Encounter: Payer: Self-pay | Admitting: Family Medicine

## 2018-02-28 VITALS — BP 174/82 | HR 82 | Temp 98.6°F | Ht 61.0 in | Wt 245.1 lb

## 2018-02-28 DIAGNOSIS — J989 Respiratory disorder, unspecified: Secondary | ICD-10-CM

## 2018-02-28 DIAGNOSIS — E1159 Type 2 diabetes mellitus with other circulatory complications: Secondary | ICD-10-CM

## 2018-02-28 DIAGNOSIS — I10 Essential (primary) hypertension: Secondary | ICD-10-CM

## 2018-02-28 DIAGNOSIS — I152 Hypertension secondary to endocrine disorders: Secondary | ICD-10-CM | POA: Insufficient documentation

## 2018-02-28 LAB — POC INFLUENZA A&B (BINAX/QUICKVUE)
Influenza A, POC: POSITIVE — AB
Influenza B, POC: NEGATIVE

## 2018-02-28 MED ORDER — BENZONATATE 100 MG PO CAPS: 100.0000 mg | ORAL_CAPSULE | Freq: Three times a day (TID) | ORAL | 0 refills | Status: DC | PRN

## 2018-02-28 NOTE — Addendum Note (Signed)
Addended by: Agnes Lawrence on: 02/28/2018 09:37 AM   Modules accepted: Orders

## 2018-02-28 NOTE — Patient Instructions (Signed)
BEFORE YOU LEAVE: -Flu swab -X-ray she -follow up: As scheduled for follow-up in May, sooner as needed  Go get the chest x-ray today  You can use Tessalon for the cough  Call your cardiologist today about an appointment  In the interim, increase your Norvasc to 1 and 1/2 tablets, 7.5 mg daily  I hope you are feeling better soon! Seek care promptly if your symptoms worsen, new concerns arise or you are not improving with treatment.

## 2018-02-28 NOTE — Addendum Note (Signed)
Addended by: Dorrene German on: 02/28/2018 10:13 AM   Modules accepted: Orders

## 2018-03-01 ENCOUNTER — Telehealth: Payer: Self-pay | Admitting: *Deleted

## 2018-03-01 NOTE — Telephone Encounter (Signed)
Manual transmission received on 03/01/18. 2 "AF" episodes appear false-- ECGs suggest SR/ST with PACs. Will continue to monitor.

## 2018-03-01 NOTE — Telephone Encounter (Signed)
Attempted to reach patient regarding sending manual transmission to review 2 AF episodes. No answer, no voicemail.

## 2018-03-01 NOTE — Telephone Encounter (Signed)
Spoke w/ pt and informed pt to send a remote transmission w/ her home monitor.

## 2018-03-05 ENCOUNTER — Encounter: Payer: Self-pay | Admitting: Internal Medicine

## 2018-03-05 LAB — CUP PACEART REMOTE DEVICE CHECK
Implantable Pulse Generator Implant Date: 20170221
MDC IDC SESS DTM: 20190212064211

## 2018-03-11 ENCOUNTER — Telehealth: Payer: Self-pay | Admitting: Podiatry

## 2018-03-11 ENCOUNTER — Other Ambulatory Visit: Payer: Self-pay | Admitting: Cardiovascular Disease

## 2018-03-11 ENCOUNTER — Ambulatory Visit (INDEPENDENT_AMBULATORY_CARE_PROVIDER_SITE_OTHER): Payer: Medicare Other | Admitting: *Deleted

## 2018-03-11 DIAGNOSIS — R55 Syncope and collapse: Secondary | ICD-10-CM | POA: Diagnosis not present

## 2018-03-11 NOTE — Progress Notes (Signed)
Carelink Summary Report / Loop Recorder 

## 2018-03-12 NOTE — Telephone Encounter (Signed)
Called pt per her request today to get her scheduled for diabetic shoe measurements.Pt asked how much would she have to pay and I explained usually medicare(pt has [facility] mcr and they follow mcr quidelines) pays 80% and pt would be responsible for 20% which would be between 80 and 100 dollars. I explained to pt that it would be billed to her after she picked up her shoes and she could make payments if needed. She said she could not afford that at this time and is going to think about it and call us back.

## 2018-03-18 ENCOUNTER — Other Ambulatory Visit: Payer: Self-pay | Admitting: Family Medicine

## 2018-03-19 ENCOUNTER — Telehealth: Payer: Self-pay | Admitting: Family Medicine

## 2018-03-19 ENCOUNTER — Telehealth: Payer: Self-pay | Admitting: *Deleted

## 2018-03-19 NOTE — Telephone Encounter (Signed)
I called the pt and informed her of the message below.  She stated her medication was a part of the recall and she would like to have the new Rx sent to [facility].  Message sent to Dr Maudie Mercury and the pt is aware this will be addressed on Monday.

## 2018-03-19 NOTE — Telephone Encounter (Signed)
I would continue the Losartan until Dr Maudie Mercury is back to discuss.  Most lots of the Losartan were not effected by the recall

## 2018-03-19 NOTE — Telephone Encounter (Signed)
Manual transmission received.  Tachy episode suggests 8sec of disorganized atrial activity.  All three "AF" episodes appear ST w/PACs, example included below. ECGs also printed and placed in Dr. Langley Gauss folder for review.  Per previous phone notes, patient is aware to call or use symptom activator for presyncopal/syncopal episodes.

## 2018-03-19 NOTE — Telephone Encounter (Signed)
Spoke with patient, requested manual transmission for review.  Patient agrees to send transmission.  Will review 3 "AF" episodes when transmission received.  Available ECG false, shows ST w/PACs.

## 2018-03-19 NOTE — Telephone Encounter (Signed)
Copied from [place] [id number]. Topic: Quick Communication - See Telephone Encounter >> Mar 19, 2018 11:43 AM Antonieta Iba C wrote: CRM for notification. See Telephone encounter for: 03/19/18.  Pt  called in to be advised. Pt says that her medication losartan (COZAAR) 50 MG tablet potassium has been recalled. Pt would like to discuss further.  [phone]

## 2018-03-21 NOTE — Telephone Encounter (Signed)
Agree- not true AFib MCr

## 2018-03-21 NOTE — Progress Notes (Signed)
Cardiology Office Note   Date:  03/25/2018   ID:  Mcintyre, Katie 09-12-1941, MRN [id number]  PCP:  [name], DO  Cardiologist:  [facility] Chief Complaint  Patient presents with  . Follow-up    sent by Dr Maudie Mercury for elevated blood pressure     History of Present Illness: Katie Mcintyre is a 77 y.o. female who presents for ongoing assessment and management of syncope, with other history to include hypertension, dyslipidemia, insulin-dependent diabetes, and morbid obesity.  The patient was last seen in the office by Dr. Debara Pickett on 08/17/2016.  She was scheduled for a sleep study.  Loop recorder placed in September 2017.  With follow-up interrogations.  Most recent interrogation was on 02/04/2018.  Interrogation:   Carelink summary report received. Battery status OK. Normal device function. No new symptom, brady, or pause episodes. 1 tachy episode--ECG appears ?SVT, duration 40sec, median V rate 188bpm, patient asymptomatic (see routed phone note). 2 AF    She is here today at the request of her primary care physician for difficult to control hypertension.  Patient had a recent colonoscopy and was found to be hypertensive during that procedure was referred back to her primary care physician Dr. Maudie Mercury, who has sent her back to cardiology for further evaluation.  The patient complains of headache and some chest pressure.  She has chronic dyspnea on exertion.  The patient is obese and has never been checked for obstructive sleep apnea.   Past Medical History:  Diagnosis Date  . Anemia   . Arthritis   . Cancer ([place]) 1969   cervical  . Cataract    removed both eyes with lens implant both   . Chicken pox   . Chronic bronchitis ([place])   . Colon polyp   . Depression 06/30/2013  . Diabetes mellitus without complication ([place])   . Diverticulitis   . Frequent headaches   . GERD (gastroesophageal reflux disease)   . Gout 06/30/2013  . Hx of dizziness   . Hyperlipidemia   . Hypertension   .  Post-operative nausea and vomiting   . S/P epidural steroid injection 09/27/2016   done in back at [place]   . Spinal stenosis of lumbar region    s/p spine surgery 02/12/13 with Dr Marcos Eke at [facility]  . Thyroid disease   . Urine incontinence   . Use of cane as ambulatory aid     Past Surgical History:  Procedure Laterality Date  . ABDOMINAL HYSTERECTOMY  1970   complete hysterectomy for cervical cancer  . BACK SURGERY  01/2013  . BACK SURGERY  1995  . CATARACT EXTRACTION, BILATERAL     with lens implant   . CERVICAL CONE BIOPSY  1970  . COLONOSCOPY    . EP IMPLANTABLE DEVICE N/A 02/15/2016   Procedure: Loop Recorder Insertion;  Surgeon: Sanda Klein, MD;  Location: [place] [facility];  Service: Cardiovascular;  Laterality: N/A;  . POLYPECTOMY    . WRIST SURGERY  2010   for wrist fracture     Current Outpatient Medications  Medication Sig Dispense Refill  . ACCU-CHEK AVIVA PLUS test strip USE AS INSTRUCTED TO CHECK BLOOD SUGAR TWICE A DAY * E11.9* 100 each 5  . ACCU-CHEK FASTCLIX LANCETS MISC USE AS DIRECTED 102 each 3  . acetaminophen (TYLENOL) 500 MG tablet Take 1,000 mg by mouth every 8 (eight) hours as needed.    Marland Kitchen amLODipine (NORVASC) 5 MG tablet TAKE 1 TABLET BY MOUTH ONCE DAILY  90 tablet 1  . aspirin 81 MG tablet Take 81 mg by mouth daily.    . B-D UF III MINI PEN NEEDLES 31G X 5 MM MISC USE TO INJECT INSULIN TWICE A DAY 100 each 3  . Blood Glucose Calibration (ACCU-CHEK AVIVA) SOLN Use as directed. 1 each 1  . calcium-vitamin D (OSCAL 500/200 D-3) 500-200 MG-UNIT per tablet Take 1 tablet by mouth daily.    . cloNIDine (CATAPRES) 0.1 MG tablet TAKE 1 TABLET BY MOUTH TWICE A DAY 180 tablet 1  . ferrous sulfate 325 (65 FE) MG tablet Take 325 mg by mouth daily with breakfast.    . hydrALAZINE (APRESOLINE) 50 MG tablet Take 1 tablet (50 mg total) by mouth 3 (three) times daily. 90 tablet 5  . Insulin Lispro Prot & Lispro (HUMALOG MIX 75/25 KWIKPEN) (75-25) 100 UNIT/ML Kwikpen  Inject 15 Units into the skin 2 (two) times daily before a meal. 15 pen 4  . isosorbide dinitrate (ISORDIL) 20 MG tablet TAKE 1 TABLET (20 MG TOTAL) BY MOUTH 2 (TWO) TIMES DAILY. 60 tablet 5  . Lancet Devices (ACCU-CHEK SOFTCLIX) lancets Use as instructed twice a day 1 each 5  . levothyroxine (SYNTHROID, LEVOTHROID) 125 MCG tablet TAKE 1 TABLET BY MOUTH EVERY DAY 90 tablet 1  . metFORMIN (GLUCOPHAGE) 1000 MG tablet TAKE 1 TABLET BY MOUTH TWICE DAILY WITH MEALS 180 tablet 1  . Sennosides-Docusate Sodium (STOOL SOFTENER & LAXATIVE PO) Take 1 tablet by mouth daily as needed (constipation).     . simvastatin (ZOCOR) 20 MG tablet TAKE 1 TABLET BY MOUTH EVERY DAY 90 tablet 1  . vitamin B-12 (CYANOCOBALAMIN) 1000 MCG tablet Take 1,000 mcg by mouth daily.    . irbesartan (AVAPRO) 75 MG tablet Take 1 tablet (75 mg total) by mouth daily. 30 tablet 5   Current Facility-Administered Medications  Medication Dose Route Frequency Provider Last Rate Last Dose  . 0.9 %  sodium chloride infusion  500 mL Intravenous Once Pyrtle, Lajuan Lines, MD      . ipratropium-albuterol (DUONEB) 0.5-2.5 (3) MG/3ML nebulizer solution 3 mL  3 mL Nebulization Once Delano Metz, FNP        Allergies:   Advil [ibuprofen] and Lyrica [pregabalin]    Social History:  The patient  reports that she quit smoking about 53 years ago. She has never used smokeless tobacco. She reports that she drinks alcohol. She reports that she does not use drugs.   Family History:  The patient's family history includes Alcohol abuse in her brother and father; Arthritis in her unknown relative; Breast cancer in her sister; Cancer in her mother and sister; Cirrhosis in her father; Diabetes in her brother and unknown relative; Healthy in her daughter, daughter, son, and son; Heart Problems in her daughter; Heart attack in her brother; Heart disease in her other; Hyperlipidemia in her unknown relative; Hypertension in her unknown relative; Mental illness in her  other; Stroke in her paternal grandmother.    ROS: All other systems are reviewed and negative. Unless otherwise mentioned in H&P    PHYSICAL EXAM: VS:  BP (!) 191/98   Pulse 88   Ht 5' 1.5" (1.562 m)   Wt 247 lb (112 kg)   BMI 45.91 kg/m  , BMI Body mass index is 45.91 kg/m. GEN: Well nourished, well developed, in no acute distress obese HEENT: normal  Neck: no JVD, carotid bruits, or masses Cardiac: RRR;distant heart sounds,  no murmurs, rubs, or gallops,no edema  Respiratory:  clear to auscultation bilaterally, normal work of breathing GI: soft, nontender, nondistended, + BS MS: no deformity or atrophy  Skin: warm and dry, no rash Neuro:  Strength and sensation are intact Psych: euthymic mood, flat  affect   EKG: The normal sinus rhythm, heart rate of 88 bpm, left anterior fascicular block.   Recent Labs: 04/22/2017: ALT 23 02/07/2018: BUN 19; Creatinine, Ser 0.82; Hemoglobin 12.4; Platelets 281.0; Potassium 4.1; Sodium 141; TSH 4.24    Lipid Panel    Component Value Date/Time   CHOL 163 11/06/2017 0936   TRIG 129.0 11/06/2017 0936   HDL 60.30 11/06/2017 0936   CHOLHDL 3 11/06/2017 0936   VLDL 25.8 11/06/2017 0936   LDLCALC 77 11/06/2017 0936      Wt Readings from Last 3 Encounters:  03/25/18 247 lb (112 kg)  02/28/18 245 lb 1.6 oz (111.2 kg)  02/26/18 250 lb (113.4 kg)      Other studies Reviewed: Echocardiogram 2016/03/28 Procedure narrative: Transthoracic echocardiography. Image   quality was suboptimal. The study was technically difficult.   Intravenous contrast (Definity) was administered. - Left ventricle: The cavity size was normal. Wall thickness was   increased in a pattern of mild LVH. Systolic function was normal.   The estimated ejection fraction was in the range of 60% to 65%.   Doppler parameters are consistent with abnormal left ventricular   relaxation (grade 1 diastolic dysfunction).Procedure narrative: Transthoracic echocardiography. Image    quality was suboptimal. The study was technically difficult.   Intravenous contrast (Definity) was administered. - Left ventricle: The cavity size was normal. Wall thickness was   increased in a pattern of mild LVH. Systolic function was normal.   The estimated ejection fraction was in the range of 60% to 65%.   Doppler parameters are consistent with abnormal left ventricular   relaxation (grade 1 diastolic dysfunction).  ASSESSMENT AND PLAN:  1.  Uncontrolled hypertension: I have reviewed her recent medication and blood pressures.  My plan is to increase hydralazine to 50 mg 3 times daily.  Change losartan to irbesartan due to drug recall.  BMET today.  Echocardiogram will be ordered to evaluate LV function in the setting of uncontrolled hypertension for changes in diastolic heart function and for evaluation of LV systolic function.  She is counseled on a low-sodium diet.  She is counseled on medical compliance which she states she is very transient on filling her medication tray once a week.  She is advised to take blood pressures at home and record if possible.  Will make slow medication adjustment to evaluate her response.  May consider increasing amlodipine, however she does have some lower extremity edema which is probably related to amlodipine and isosorbide along with hydralazine.  Rechecking labs to evaluate kidney function may consider low-dose diuretic on next visit.  2.  Rule out OSA: Due to body habitus and uncontrolled hypertension, we will schedule the patient for a sleep study.  3.  Type 2 diabetes: Followed by primary care.  Current medicines are reviewed at length with the patient today.    Labs/ tests ordered today include: BMET, echocardiogram, sleep study.  Phill Myron. West Pugh, ANP, AACC   03/25/2018 10:53 AM    [facility] [address], [place], [place] [address] Phone: [phone]; Fax: [phone]

## 2018-03-22 NOTE — Telephone Encounter (Signed)
Advise appt if wishes to change. O/w can check with her pharmacy to see if her particular batch/lot was involved. Many were not and her pharmacy should be able to give her one not recalled.

## 2018-03-22 NOTE — Telephone Encounter (Signed)
I called the pt and scheduled an appt for 4/2.

## 2018-03-25 ENCOUNTER — Ambulatory Visit: Payer: Medicare Other | Admitting: Adult Health

## 2018-03-25 ENCOUNTER — Encounter: Payer: Self-pay | Admitting: Adult Health

## 2018-03-25 VITALS — BP 191/98 | HR 88 | Ht 61.5 in | Wt 247.0 lb

## 2018-03-25 DIAGNOSIS — R0602 Shortness of breath: Secondary | ICD-10-CM | POA: Diagnosis not present

## 2018-03-25 DIAGNOSIS — I152 Hypertension secondary to endocrine disorders: Secondary | ICD-10-CM

## 2018-03-25 DIAGNOSIS — Z79899 Other long term (current) drug therapy: Secondary | ICD-10-CM | POA: Diagnosis not present

## 2018-03-25 DIAGNOSIS — E1159 Type 2 diabetes mellitus with other circulatory complications: Secondary | ICD-10-CM

## 2018-03-25 DIAGNOSIS — I10 Essential (primary) hypertension: Secondary | ICD-10-CM | POA: Diagnosis not present

## 2018-03-25 LAB — BASIC METABOLIC PANEL
BUN/Creatinine Ratio: 24 (ref 12–28)
BUN: 19 mg/dL (ref 8–27)
CALCIUM: 9.3 mg/dL (ref 8.7–10.3)
CHLORIDE: 100 mmol/L (ref 96–106)
CO2: 22 mmol/L (ref 20–29)
Creatinine, Ser: 0.79 mg/dL (ref 0.57–1.00)
GFR, EST AFRICAN AMERICAN: 84 mL/min/{1.73_m2} (ref >59)
GFR, EST NON AFRICAN AMERICAN: 72 mL/min/{1.73_m2} (ref >59)
Glucose: 161 mg/dL — ABNORMAL HIGH (ref 65–99)
POTASSIUM: 4.3 mmol/L (ref 3.5–5.2)
Sodium: 137 mmol/L (ref 134–144)

## 2018-03-25 MED ORDER — HYDRALAZINE HCL 50 MG PO TABS: 50.0000 mg | ORAL_TABLET | Freq: Three times a day (TID) | ORAL | 5 refills | Status: DC

## 2018-03-25 MED ORDER — IRBESARTAN 75 MG PO TABS: 75.0000 mg | ORAL_TABLET | Freq: Every day | ORAL | 5 refills | Status: DC

## 2018-03-25 NOTE — Patient Instructions (Signed)
Medication Instructions:  INCREASE HYDRALAZINE 50MG  THREE TIMES DAILY  STOP LOSARTAN  START IRBESARTAN 75MG  DAILY  If you need a refill on your cardiac medications before your next appointment, please call your pharmacy.  Labwork: BMET TODAY HERE IN OUR OFFICE AT [facility]  Take the provided lab slips for you to take with you to the lab for you blood draw.  Testing/Procedures: Echocardiogram - Your physician has requested that you have an echocardiogram. Echocardiography is a painless test that uses sound waves to create images of your heart. It provides your doctor with information about the size and shape of your heart and how well your heart's chambers and valves are working. This procedure takes approximately one hour. There are no restrictions for this procedure. This will be performed at our [facility] location - [address].  Your physician has recommended that you have a sleep study. This test records several body functions during sleep, including: brain activity, eye movement, oxygen and carbon dioxide blood levels, heart rate and rhythm, breathing rate and rhythm, the flow of air through your mouth and nose, snoring, body muscle movements, and chest and belly movement.  Follow-Up: Your physician wants you to follow-up in: AFTER TESTING WITH DR HILTY ONLY  Thank you for choosing [facility] at [facility]!!

## 2018-03-26 ENCOUNTER — Ambulatory Visit: Payer: Medicare Other | Admitting: Family Medicine

## 2018-03-26 ENCOUNTER — Encounter: Payer: Self-pay | Admitting: Family Medicine

## 2018-03-26 VITALS — BP 150/70 | HR 69 | Temp 97.8°F | Ht 61.5 in | Wt 245.9 lb

## 2018-03-26 DIAGNOSIS — E1159 Type 2 diabetes mellitus with other circulatory complications: Secondary | ICD-10-CM

## 2018-03-26 DIAGNOSIS — E039 Hypothyroidism, unspecified: Secondary | ICD-10-CM | POA: Diagnosis not present

## 2018-03-26 DIAGNOSIS — Z794 Long term (current) use of insulin: Secondary | ICD-10-CM

## 2018-03-26 DIAGNOSIS — I10 Essential (primary) hypertension: Secondary | ICD-10-CM

## 2018-03-26 DIAGNOSIS — I152 Hypertension secondary to endocrine disorders: Secondary | ICD-10-CM

## 2018-03-26 NOTE — Progress Notes (Signed)
HPI:  Using dictation device. Unfortunately this device frequently misinterprets words/phrases.  Katie Mcintyre is a pleasant 77 year old with a past medical history significant for diabetes, hypertension, hypothyroidism, bradycardia, presyncope, depression and morbid obesity here for follow-up.  She saw her cardiologist just yesterday about her blood pressure and some chest discomfort.  She is having an echocardiogram and a number of her medications were changed.  Her aspirin was DC'd due to recent studies showing risks may outweigh benefits.  Her hydralazine was increased.  Her arb was changed due to concerns regarding the recent recalls.  Her cardiologist did some labs including a CBC and BMP.  She increased her insulin, but did not get back to [place] on her fasting labs.  Today she brings a log and her fasting blood sugars have been running in the 140s-180s on average.  She denies any low blood sugars.  She reports she is doing okay today without chest pain or shortness of breath.  She is taking 14 units twice daily of the Humalog. Annual wellness visit 11/06/2017  Updated family history is she has 2 sisters with breast cancer.  Reports they had genetic testing and it was negative.  ROS: See pertinent positives and negatives per HPI.  Past Medical History:  Diagnosis Date  . Anemia   . Arthritis   . Cancer ([place]) 1969   , Status post complete hysterectomy per patient  . Cataract    removed both eyes with lens implant both   . Chicken pox   . Chronic bronchitis ([place])   . Colon polyp   . Depression 06/30/2013  . Diabetes mellitus without complication ([place])   . Diverticulitis   . Frequent headaches   . GERD (gastroesophageal reflux disease)   . Gout 06/30/2013  . Hx of dizziness   . Hyperlipidemia   . Hypertension   . Post-operative nausea and vomiting   . S/P epidural steroid injection 09/27/2016   done in back at [place]   . Spinal stenosis of lumbar region    s/p spine surgery 02/12/13  with Dr Marcos Eke at [facility]  . Thyroid disease   . Urine incontinence   . Use of cane as ambulatory aid     Past Surgical History:  Procedure Laterality Date  . ABDOMINAL HYSTERECTOMY  1970   complete hysterectomy for cervical cancer  . BACK SURGERY  01/2013  . BACK SURGERY  1995  . CATARACT EXTRACTION, BILATERAL     with lens implant   . CERVICAL CONE BIOPSY  1970  . COLONOSCOPY    . EP IMPLANTABLE DEVICE N/A 02/15/2016   Procedure: Loop Recorder Insertion;  Surgeon: Sanda Klein, MD;  Location: [place] [facility];  Service: Cardiovascular;  Laterality: N/A;  . POLYPECTOMY    . WRIST SURGERY  2010   for wrist fracture    Family History  Problem Relation Age of Onset  . Alcohol abuse Father   . Cirrhosis Father   . Heart attack Brother   . Breast cancer Sister        Reports all family members with breast cancer have had genetic testing that was negative  . Cancer Mother        renal cancer  . Stroke Paternal Grandmother   . Alcohol abuse Brother   . Breast cancer Sister        renal cancer  . Diabetes Brother   . Heart Problems Daughter   . Healthy Daughter   . Healthy Daughter   .  Healthy Son   . Healthy Son   . Arthritis Unknown        parents  . Hyperlipidemia Unknown        parent  . Hypertension Unknown        parent/grandparent  . Diabetes Unknown        parent/grandparent  . Heart disease Other   . Mental illness Other   . Colon cancer Neg Hx   . Esophageal cancer Neg Hx   . Prostate cancer Neg Hx   . Rectal cancer Neg Hx   . Seizures Neg Hx   . Colon polyps Neg Hx     SOCIAL HX: See HPI   Current Outpatient Medications:  .  ACCU-CHEK AVIVA PLUS test strip, USE AS INSTRUCTED TO CHECK BLOOD SUGAR TWICE A DAY * E11.9*, Disp: 100 each, Rfl: 5 .  ACCU-CHEK FASTCLIX LANCETS MISC, USE AS DIRECTED, Disp: 102 each, Rfl: 3 .  acetaminophen (TYLENOL) 500 MG tablet, Take 1,000 mg by mouth every 8 (eight) hours as needed., Disp: , Rfl:  .  amLODipine  (NORVASC) 5 MG tablet, TAKE 1 TABLET BY MOUTH ONCE DAILY, Disp: 90 tablet, Rfl: 1 .  B-D UF III MINI PEN NEEDLES 31G X 5 MM MISC, USE TO INJECT INSULIN TWICE A DAY, Disp: 100 each, Rfl: 3 .  Blood Glucose Calibration (ACCU-CHEK AVIVA) SOLN, Use as directed., Disp: 1 each, Rfl: 1 .  calcium-vitamin D (OSCAL 500/200 D-3) 500-200 MG-UNIT per tablet, Take 1 tablet by mouth daily., Disp: , Rfl:  .  cloNIDine (CATAPRES) 0.1 MG tablet, TAKE 1 TABLET BY MOUTH TWICE A DAY, Disp: 180 tablet, Rfl: 1 .  ferrous sulfate 325 (65 FE) MG tablet, Take 325 mg by mouth daily with breakfast., Disp: , Rfl:  .  hydrALAZINE (APRESOLINE) 50 MG tablet, Take 1 tablet (50 mg total) by mouth 3 (three) times daily., Disp: 90 tablet, Rfl: 5 .  Insulin Lispro Prot & Lispro (HUMALOG MIX 75/25 KWIKPEN) (75-25) 100 UNIT/ML Kwikpen, Inject 15 Units into the skin 2 (two) times daily before a meal., Disp: 15 pen, Rfl: 4 .  irbesartan (AVAPRO) 75 MG tablet, Take 1 tablet (75 mg total) by mouth daily., Disp: 30 tablet, Rfl: 5 .  isosorbide dinitrate (ISORDIL) 20 MG tablet, TAKE 1 TABLET (20 MG TOTAL) BY MOUTH 2 (TWO) TIMES DAILY., Disp: 60 tablet, Rfl: 5 .  Lancet Devices (ACCU-CHEK SOFTCLIX) lancets, Use as instructed twice a day, Disp: 1 each, Rfl: 5 .  levothyroxine (SYNTHROID, LEVOTHROID) 125 MCG tablet, TAKE 1 TABLET BY MOUTH EVERY DAY, Disp: 90 tablet, Rfl: 1 .  metFORMIN (GLUCOPHAGE) 1000 MG tablet, TAKE 1 TABLET BY MOUTH TWICE DAILY WITH MEALS, Disp: 180 tablet, Rfl: 1 .  Sennosides-Docusate Sodium (STOOL SOFTENER & LAXATIVE PO), Take 1 tablet by mouth daily as needed (constipation). , Disp: , Rfl:  .  simvastatin (ZOCOR) 20 MG tablet, TAKE 1 TABLET BY MOUTH EVERY DAY, Disp: 90 tablet, Rfl: 1 .  vitamin B-12 (CYANOCOBALAMIN) 1000 MCG tablet, Take 1,000 mcg by mouth daily., Disp: , Rfl:   Current Facility-Administered Medications:  .  0.9 %  sodium chloride infusion, 500 mL, Intravenous, Once, Pyrtle, Lajuan Lines, MD .   ipratropium-albuterol (DUONEB) 0.5-2.5 (3) MG/3ML nebulizer solution 3 mL, 3 mL, Nebulization, Once, Kordsmeier, Gregary Signs, FNP  EXAM:  Vitals:   03/26/18 1030  BP: (!) 150/70  Pulse: 69  Temp: 97.8 F (36.6 C)    Body mass index is 45.71 kg/m.  GENERAL: vitals  reviewed and listed above, alert, oriented, appears well hydrated and in no acute distress  HEENT: atraumatic, conjunttiva clear, no obvious abnormalities on inspection of external nose and ears  NECK: no obvious masses on inspection  LUNGS: clear to auscultation bilaterally, no wheezes, rales or rhonchi, good air movement  CV: HRRR, no peripheral edema  MS: moves all extremities without noticeable abnormality  PSYCH: pleasant and cooperative, no obvious depression or anxiety  ASSESSMENT AND PLAN:  Discussed the following assessment and plan:  Type 2 diabetes mellitus with other circulatory complication, with long-term current use of insulin (HCC)  Hypertension associated with diabetes (HCC)  Morbid obesity ([place])  Hypothyroidism, unspecified type  -Advised of goals for diabetes and advised to titrate the insulin further, advised to call us in 1 week with her blood sugar values so that we can help her further titrate -She is seeing her cardiologist for her blood pressure, not at goal today, but just changed her medications -Discussed the importance of a healthy diet and regular exercise -Follow-up in 3 months -Return sooner in the interim  Patient Instructions  BEFORE YOU LEAVE: -follow up: 3 months  Increase the insulin to 15 units twice daily.  Check your fasting blood sugars and call us in 1 week with these results.  Eat a healthy, low sugar diet.  Try to avoid sugar, processed food, red meats, fried foods and fatty foods.  Eat lots of vegetables, lean protein such as fish, nuts and seeds, low-fat dairy, beans, whole grains, low-fat poultry and some fermented foods on a regular basis.  Try to get regular  exercise, at least 150 minutes of walking or aerobic exercise weekly.  Continue to work with your cardiologist regarding the blood pressure.   We have ordered labs or studies at this visit. It can take up to 1-2 weeks for results and processing. IF results require follow up or explanation, we will call you with instructions. Clinically stable results will be released to your [facility]. If you have not heard from [place] or cannot find your results in [facility] in 2 weeks please contact our office at [phone].  If you are not yet signed up for [facility], please consider signing up.          [name], DO

## 2018-03-26 NOTE — Patient Instructions (Addendum)
BEFORE YOU LEAVE: -follow up: 3 months  Increase the insulin to 15 units twice daily.  Check your fasting blood sugars and call us in 1 week with these results.  Eat a healthy, low sugar diet.  Try to avoid sugar, processed food, red meats, fried foods and fatty foods.  Eat lots of vegetables, lean protein such as fish, nuts and seeds, low-fat dairy, beans, whole grains, low-fat poultry and some fermented foods on a regular basis.  Try to get regular exercise, at least 150 minutes of walking or aerobic exercise weekly.  Continue to work with your cardiologist regarding the blood pressure.   We have ordered labs or studies at this visit. It can take up to 1-2 weeks for results and processing. IF results require follow up or explanation, we will call you with instructions. Clinically stable results will be released to your [facility]. If you have not heard from [place] or cannot find your results in [facility] in 2 weeks please contact our office at [phone].  If you are not yet signed up for [facility], please consider signing up.

## 2018-03-27 ENCOUNTER — Other Ambulatory Visit: Payer: Self-pay

## 2018-03-27 ENCOUNTER — Ambulatory Visit (HOSPITAL_COMMUNITY): Payer: Medicare Other | Attending: Cardiovascular Disease

## 2018-03-27 DIAGNOSIS — R0602 Shortness of breath: Secondary | ICD-10-CM | POA: Diagnosis not present

## 2018-03-27 DIAGNOSIS — I10 Essential (primary) hypertension: Secondary | ICD-10-CM | POA: Diagnosis not present

## 2018-03-27 DIAGNOSIS — I071 Rheumatic tricuspid insufficiency: Secondary | ICD-10-CM | POA: Insufficient documentation

## 2018-03-27 DIAGNOSIS — E1159 Type 2 diabetes mellitus with other circulatory complications: Secondary | ICD-10-CM | POA: Diagnosis not present

## 2018-03-27 DIAGNOSIS — E119 Type 2 diabetes mellitus without complications: Secondary | ICD-10-CM | POA: Diagnosis not present

## 2018-03-27 DIAGNOSIS — I152 Hypertension secondary to endocrine disorders: Secondary | ICD-10-CM

## 2018-03-27 MED ORDER — PERFLUTREN LIPID MICROSPHERE
1.0000 mL | INTRAVENOUS | Status: AC | PRN
  Administered 2018-03-27: 1 mL via INTRAVENOUS

## 2018-04-07 ENCOUNTER — Other Ambulatory Visit: Payer: Self-pay | Admitting: Family Medicine

## 2018-04-12 ENCOUNTER — Ambulatory Visit (INDEPENDENT_AMBULATORY_CARE_PROVIDER_SITE_OTHER): Payer: Medicare Other | Admitting: *Deleted

## 2018-04-12 DIAGNOSIS — R55 Syncope and collapse: Secondary | ICD-10-CM | POA: Diagnosis not present

## 2018-04-12 NOTE — Progress Notes (Signed)
Carelink Summary Report / Loop Recorder 

## 2018-04-14 ENCOUNTER — Other Ambulatory Visit: Payer: Self-pay | Admitting: Family Medicine

## 2018-04-15 LAB — CUP PACEART REMOTE DEVICE CHECK
Date Time Interrogation Session: 20190317093956
MDC IDC PG IMPLANT DT: 20170221

## 2018-04-16 ENCOUNTER — Encounter: Payer: Self-pay | Admitting: Family Medicine

## 2018-04-16 ENCOUNTER — Ambulatory Visit (INDEPENDENT_AMBULATORY_CARE_PROVIDER_SITE_OTHER): Payer: Medicare Other | Admitting: Family Medicine

## 2018-04-16 ENCOUNTER — Telehealth: Payer: Self-pay | Admitting: *Deleted

## 2018-04-16 VITALS — BP 128/78 | HR 79 | Temp 98.6°F | Ht 61.5 in | Wt 245.2 lb

## 2018-04-16 DIAGNOSIS — M5442 Lumbago with sciatica, left side: Secondary | ICD-10-CM | POA: Diagnosis not present

## 2018-04-16 LAB — POCT URINALYSIS DIPSTICK
Bilirubin, UA: NEGATIVE
GLUCOSE UA: 2000
Ketones, UA: NEGATIVE
LEUKOCYTES UA: NEGATIVE
NITRITE UA: NEGATIVE
PROTEIN UA: 2000
RBC UA: NEGATIVE
SPEC GRAV UA: 1.02 (ref 1.010–1.025)
Urobilinogen, UA: 0.2 E.U./dL
pH, UA: 5 (ref 5.0–8.0)

## 2018-04-16 MED ORDER — TIZANIDINE HCL 2 MG PO TABS: 2.0000 mg | ORAL_TABLET | Freq: Three times a day (TID) | ORAL | 0 refills | Status: DC | PRN

## 2018-04-16 NOTE — Progress Notes (Signed)
HPI:  Using dictation device. Unfortunately this device frequently misinterprets words/phrases.  Acute visit for L lower back pain: -x1 week -has had issues with back pain in the past, sees Dr. Marcos Eke at [facility], has had surgery and injections -radiates a little to L buttock -worse when shifting positions, better once up and walking -tylenol helps a little -denies: weakness, numbness, fevers, malaise, change in bowel or bladder function -blood sugars in lower 100s at home  ROS: See pertinent positives and negatives per HPI.  Past Medical History:  Diagnosis Date  . Anemia   . Arthritis   . Cancer ([place]) 1969   , Status post complete hysterectomy per patient  . Cataract    removed both eyes with lens implant both   . Chicken pox   . Chronic bronchitis ([place])   . Colon polyp   . Depression 06/30/2013  . Diabetes mellitus without complication ([place])   . Diverticulitis   . Frequent headaches   . GERD (gastroesophageal reflux disease)   . Gout 06/30/2013  . Hx of dizziness   . Hyperlipidemia   . Hypertension   . Post-operative nausea and vomiting   . S/P epidural steroid injection 09/27/2016   done in back at [place]   . Spinal stenosis of lumbar region    s/p spine surgery 02/12/13 with Dr Marcos Eke at [facility]  . Thyroid disease   . Urine incontinence   . Use of cane as ambulatory aid     Past Surgical History:  Procedure Laterality Date  . ABDOMINAL HYSTERECTOMY  1970   complete hysterectomy for cervical cancer  . BACK SURGERY  01/2013  . BACK SURGERY  1995  . CATARACT EXTRACTION, BILATERAL     with lens implant   . CERVICAL CONE BIOPSY  1970  . COLONOSCOPY    . EP IMPLANTABLE DEVICE N/A 02/15/2016   Procedure: Loop Recorder Insertion;  Surgeon: Sanda Klein, MD;  Location: [place] [facility];  Service: Cardiovascular;  Laterality: N/A;  . POLYPECTOMY    . WRIST SURGERY  2010   for wrist fracture    Family History  Problem Relation Age of Onset  . Alcohol abuse Father    . Cirrhosis Father   . Heart attack Brother   . Breast cancer Sister        Reports all family members with breast cancer have had genetic testing that was negative  . Cancer Mother        renal cancer  . Stroke Paternal Grandmother   . Alcohol abuse Brother   . Breast cancer Sister        renal cancer  . Diabetes Brother   . Heart Problems Daughter   . Healthy Daughter   . Healthy Daughter   . Healthy Son   . Healthy Son   . Arthritis Unknown        parents  . Hyperlipidemia Unknown        parent  . Hypertension Unknown        parent/grandparent  . Diabetes Unknown        parent/grandparent  . Heart disease Other   . Mental illness Other   . Colon cancer Neg Hx   . Esophageal cancer Neg Hx   . Prostate cancer Neg Hx   . Rectal cancer Neg Hx   . Seizures Neg Hx   . Colon polyps Neg Hx     SOCIAL HX: see above   Current Outpatient Medications:  .  ACCU-CHEK AVIVA  PLUS test strip, USE AS INSTRUCTED TO CHECK BLOOD SUGAR TWICE A DAY * E11.9*, Disp: 100 each, Rfl: 5 .  ACCU-CHEK FASTCLIX LANCETS MISC, USE AS DIRECTED, Disp: 102 each, Rfl: 3 .  acetaminophen (TYLENOL) 500 MG tablet, Take 1,000 mg by mouth every 8 (eight) hours as needed., Disp: , Rfl:  .  amLODipine (NORVASC) 5 MG tablet, TAKE 1 TABLET BY MOUTH ONCE DAILY, Disp: 90 tablet, Rfl: 1 .  B-D UF III MINI PEN NEEDLES 31G X 5 MM MISC, USE TO INJECT INSULIN TWICE A DAY, Disp: 100 each, Rfl: 3 .  Blood Glucose Calibration (ACCU-CHEK AVIVA) SOLN, Use as directed., Disp: 1 each, Rfl: 1 .  calcium-vitamin D (OSCAL 500/200 D-3) 500-200 MG-UNIT per tablet, Take 1 tablet by mouth daily., Disp: , Rfl:  .  cloNIDine (CATAPRES) 0.1 MG tablet, TAKE 1 TABLET BY MOUTH TWICE A DAY, Disp: 180 tablet, Rfl: 1 .  ferrous sulfate 325 (65 FE) MG tablet, Take 325 mg by mouth daily with breakfast., Disp: , Rfl:  .  hydrALAZINE (APRESOLINE) 50 MG tablet, Take 1 tablet (50 mg total) by mouth 3 (three) times daily., Disp: 90 tablet, Rfl:  5 .  Insulin Lispro Prot & Lispro (HUMALOG MIX 75/25 KWIKPEN) (75-25) 100 UNIT/ML Kwikpen, Inject 15 Units into the skin 2 (two) times daily before a meal., Disp: 15 pen, Rfl: 4 .  irbesartan (AVAPRO) 75 MG tablet, Take 1 tablet (75 mg total) by mouth daily., Disp: 30 tablet, Rfl: 5 .  isosorbide dinitrate (ISORDIL) 20 MG tablet, TAKE 1 TABLET (20 MG TOTAL) BY MOUTH 2 (TWO) TIMES DAILY., Disp: 60 tablet, Rfl: 5 .  Lancet Devices (ACCU-CHEK SOFTCLIX) lancets, Use as instructed twice a day, Disp: 1 each, Rfl: 5 .  levothyroxine (SYNTHROID, LEVOTHROID) 125 MCG tablet, TAKE 1 TABLET BY MOUTH EVERY DAY, Disp: 90 tablet, Rfl: 1 .  metFORMIN (GLUCOPHAGE) 1000 MG tablet, TAKE 1 TABLET BY MOUTH TWICE DAILY WITH MEALS, Disp: 180 tablet, Rfl: 1 .  Sennosides-Docusate Sodium (STOOL SOFTENER & LAXATIVE PO), Take 1 tablet by mouth daily as needed (constipation). , Disp: , Rfl:  .  simvastatin (ZOCOR) 20 MG tablet, TAKE 1 TABLET BY MOUTH EVERY DAY, Disp: 90 tablet, Rfl: 1 .  vitamin B-12 (CYANOCOBALAMIN) 1000 MCG tablet, Take 1,000 mcg by mouth daily., Disp: , Rfl:  .  tiZANidine (ZANAFLEX) 2 MG tablet, Take 1 tablet (2 mg total) by mouth every 8 (eight) hours as needed for muscle spasms., Disp: 20 tablet, Rfl: 0  Current Facility-Administered Medications:  .  0.9 %  sodium chloride infusion, 500 mL, Intravenous, Once, Pyrtle, Lajuan Lines, MD .  ipratropium-albuterol (DUONEB) 0.5-2.5 (3) MG/3ML nebulizer solution 3 mL, 3 mL, Nebulization, Once, Kordsmeier, Gregary Signs, FNP  EXAM:  Vitals:   04/16/18 1054  BP: 128/78  Pulse: 79  Temp: 98.6 F (37 C)    Body mass index is 45.58 kg/m.  GENERAL: vitals reviewed and listed above, alert, oriented, appears well hydrated and in no acute distress  HEENT: atraumatic, conjunttiva clear, no obvious abnormalities on inspection of external nose and ears  NECK: no obvious masses on inspection  LUNGS: clear to auscultation bilaterally, no wheezes, rales or rhonchi, good air  movement  CV: HRRR, no peripheral edema  MS: moves all extremities without noticeable abnormality - except for slow and cautious gate, TTP L paraspinal region lower lumbar, Normal Gait Normal inspection of back, no obvious scoliosis or leg length descrepancy No bony TTP Soft tissue TTP at: -/+  tests: neg trendelenburg,-facet loading, -SLRT, -CLRT, -FABER, -FADIR Normal muscle strength, sensation to light touch in LEs bilaterally  PSYCH: pleasant and cooperative, no obvious depression or anxiety  ASSESSMENT AND PLAN:  Discussed the following assessment and plan:  Acute left-sided low back pain with left-sided sciatica - Plan: POC Urinalysis Dipstick  -we discussed possible serious and likely etiologies, workup and treatment, treatment risks and return precautions - suspect musculoskeletal, she reports was supposed to get 3 injections - but after one felt better so did not return with her specialist -after this discussion, Greenlee opted for follow up with her specialist, she agrees to call today, heat, conservative care, muscle relaxer in interim -follow up advised as needed for this issue and regular follow up as scheduled -of course, we advised Orla  to return or notify a doctor immediately if symptoms worsen or persist or new concerns arise.  Patient Instructions  BEFORE YOU LEAVE: -follow up: as scheduled  Please call you back doctor today about the back pain. Let us know if he recommends someone in [place] and you require a referral.  In the interim, I sent the muscle relaxer you can try at night.  Also can take tylenol [phone] mg up to 3 times daily.  Heat and topical menthol can also help - Tiger Balm - available over the counter  Gentle activity, walking, stretching can help.  I hope you are feeling better soon! Seek care promptly if your symptoms worsen, new concerns arise or you are not improving with treatment.      Katie Kern, DO

## 2018-04-16 NOTE — Patient Instructions (Addendum)
BEFORE YOU LEAVE: -follow up: as scheduled  Please call you back doctor today about the back pain. Let us know if he recommends someone in [place] and you require a referral.  In the interim, I sent the muscle relaxer you can try at night.  Also can take tylenol [phone] mg up to 3 times daily.  Heat and topical menthol can also help - Tiger Balm - available over the counter  Gentle activity, walking, stretching can help.  I hope you are feeling better soon! Seek care promptly if your symptoms worsen, new concerns arise or you are not improving with treatment.

## 2018-04-16 NOTE — Telephone Encounter (Signed)
Spoke with patient to request manual Carelink transmission for review.  Received alert for 6 "AF" episodes--available ECG shows SR w/PACs.  Patient agrees to send manual transmission.  Will review additional ECGs when transmission received.

## 2018-04-18 ENCOUNTER — Other Ambulatory Visit: Payer: Self-pay | Admitting: Family Medicine

## 2018-04-19 NOTE — Telephone Encounter (Signed)
Manual transmission received on 04/18/18.  All available ECGs appear SR/ST w/PACs.  ECGs printed and placed in Dr. Langley Gauss folder for review.

## 2018-05-07 ENCOUNTER — Ambulatory Visit: Payer: Medicare Other | Admitting: Family Medicine

## 2018-05-08 ENCOUNTER — Ambulatory Visit: Payer: Medicare Other | Admitting: Internal Medicine

## 2018-05-08 ENCOUNTER — Encounter: Payer: Self-pay | Admitting: Internal Medicine

## 2018-05-08 VITALS — BP 194/83 | HR 62 | Ht 61.5 in | Wt 242.0 lb

## 2018-05-08 DIAGNOSIS — I10 Essential (primary) hypertension: Secondary | ICD-10-CM

## 2018-05-08 DIAGNOSIS — R0602 Shortness of breath: Secondary | ICD-10-CM | POA: Diagnosis not present

## 2018-05-08 DIAGNOSIS — I152 Hypertension secondary to endocrine disorders: Secondary | ICD-10-CM

## 2018-05-08 DIAGNOSIS — E1159 Type 2 diabetes mellitus with other circulatory complications: Secondary | ICD-10-CM | POA: Diagnosis not present

## 2018-05-08 MED ORDER — IRBESARTAN 150 MG PO TABS: 150.0000 mg | ORAL_TABLET | Freq: Every day | ORAL | 3 refills | Status: DC

## 2018-05-08 NOTE — Patient Instructions (Signed)
Your physician has recommended you make the following change in your medication: INCREASE irbesartan 150mg  daily  Your physician wants you to follow-up in: 6 months with Dr. Debara Pickett. You will receive a reminder letter in the mail two months in advance. If you don't receive a letter, please call our office to schedule the follow-up appointment.

## 2018-05-08 NOTE — Progress Notes (Signed)
OFFICE NOTE  Chief Complaint:  Follow-up dyspnea, fatigue, hypertension  Primary Care Physician: [name], DO  HPI:  Katie Mcintyre is a pleasant 77 year old female with a number of cardiovascular risk factors including insulin-dependent diabetes, dyslipidemia, hypertension and morbid obesity. She recently described a couple of episodes when she was driving of presyncope/syncope. She was able to maintain control of the car but basically started to lose consciousness and was in a very afraid. She does not believe this is related to blood sugars although she said that she personally increased her Lantus around that time thinking that her blood sugars my been too high. Her primary care provider then decrease that. She denies any symptoms of heart racing or chest pain. She has reported some shortness of breath which is been progressively worse. An EKG in her primary care provider's office shows sinus bradycardia, left atrial enlargement and voltage criteria for LVH with nonspecific T-wave changes. Heart rate is 50.   Katie Mcintyre returns today for follow-up of her loop recorder. The device interrogation indicated 1 possible high rate episode for which she said she had some presyncopal symptoms while driving. There were 2 bradycardic episodes which were not captured due to under sensing. Neither of these episodes however were associated with her syncopal episode. Please refer to the device interrogation note, which is copied below for description of a recent event that she had which was witnessed during her device interrogation in our office:  "During appointment, noted that patient's eyes began to droop bilaterally and she stopped verbally responding mid-conversation. Patient had been sitting up in chair but was slumped with her chin resting on her chest by this time. Patient did not appear to lose consciousness, but her eyes remained half-open. Attempted orientation questions with patient,  but she did not verbally respond. Called patient's name and patient was able to fully open her eyes on command, but she had difficulty making eye contact initially. Elevated leg rest on chair and obtained vital signs--BP 182/81, HR 74, O2 sat 98%, CBG 145. By this time, 2-47min had passed. Patient was alert and oriented at this point. Patient reports that these are the same episodes she has been having. No rhythm abnormalities noted during this episode. She reports that she suddenly feels like she has no control over her body and she feels herself drifting away. Patient strongly advised to avoid driving unless instructed otherwise by a physician and she verbalizes understanding of instructions. Reviewed episode and vital signs with Dr. Tamala Julian (DOD) who advised that patient seek treatment at the ED as she is stable now. Patient declines transport to the hospital at this time as she feels these episodes are ongoing. Encouraged patient to seek follow-up immediately and patient states that she has upcoming appointments with Dr. Debara Pickett and Dr. Maudie Mercury. Advised patient to discuss possible neuro consult with PCP and patient verbalizes understanding of instructions."  08/17/2016  Katie Mcintyre returns today for follow-up. She reports no further episodes of presyncope. She's also had negative workup so far is to the etiology for this. EEG was negative. Her remote pacer checks of her loop recorder do not demonstrate any significant arrhythmias. I'm still concerned that she may have sleep disorder such as sleep apnea or narcolepsy. She is scheduled for sleep study at the end of September. Have highly encouraged her to keep this appointment.  05/08/2018  Katie Mcintyre was seen today in follow-up.  Recently she saw Leonia Reader, DNP, for elevated blood pressure.  She also reports chronic dyspnea on exertion and fatigue.  She had an echocardiogram which showed normal systolic function and mild diastolic dysfunction.  A  sleep study was ordered but has not yet been obtained.  Her hydralazine was increased to 50 mg 3 times daily.  Blood pressure initially was elevated today however came down to 146/80 on recheck.  She says she does not follow her blood pressures at home although her cuff is accurate and was checked with our cuff here.  PMHx:  Past Medical History:  Diagnosis Date  . Anemia   . Arthritis   . Cancer ([place]) 1969   , Status post complete hysterectomy per patient  . Cataract    removed both eyes with lens implant both   . Chicken pox   . Chronic bronchitis ([place])   . Colon polyp   . Depression 06/30/2013  . Diabetes mellitus without complication ([place])   . Diverticulitis   . Frequent headaches   . GERD (gastroesophageal reflux disease)   . Gout 06/30/2013  . Hx of dizziness   . Hyperlipidemia   . Hypertension   . Post-operative nausea and vomiting   . S/P epidural steroid injection 09/27/2016   done in back at [facility]   . Spinal stenosis of lumbar region    s/p spine surgery 02/12/13 with Dr Marcos Eke at [facility]  . Thyroid disease   . Urine incontinence   . Use of cane as ambulatory aid     Past Surgical History:  Procedure Laterality Date  . ABDOMINAL HYSTERECTOMY  1970   complete hysterectomy for cervical cancer  . BACK SURGERY  01/2013  . BACK SURGERY  1995  . CATARACT EXTRACTION, BILATERAL     with lens implant   . CERVICAL CONE BIOPSY  1970  . COLONOSCOPY    . EP IMPLANTABLE DEVICE N/A 02/15/2016   Procedure: Loop Recorder Insertion;  Surgeon: Sanda Klein, MD;  Location: [place] [facility];  Service: Cardiovascular;  Laterality: N/A;  . POLYPECTOMY    . WRIST SURGERY  2010   for wrist fracture    FAMHx:  Family History  Problem Relation Age of Onset  . Alcohol abuse Father   . Cirrhosis Father   . Heart attack Brother   . Breast cancer Sister        Reports all family members with breast cancer have had genetic testing that was negative  . Cancer Mother        renal  cancer  . Stroke Paternal Grandmother   . Alcohol abuse Brother   . Breast cancer Sister        renal cancer  . Diabetes Brother   . Heart Problems Daughter   . Healthy Daughter   . Healthy Daughter   . Healthy Son   . Healthy Son   . Arthritis Unknown        parents  . Hyperlipidemia Unknown        parent  . Hypertension Unknown        parent/grandparent  . Diabetes Unknown        parent/grandparent  . Heart disease Other   . Mental illness Other   . Colon cancer Neg Hx   . Esophageal cancer Neg Hx   . Prostate cancer Neg Hx   . Rectal cancer Neg Hx   . Seizures Neg Hx   . Colon polyps Neg Hx     SOCHx:   reports that she quit smoking about 53 years  ago. She has never used smokeless tobacco. She reports that she drinks alcohol. She reports that she does not use drugs.  ALLERGIES:  Allergies  Allergen Reactions  . Advil [Ibuprofen]     Was taking this for pain and had some ? Swollen lips  . Lyrica [Pregabalin]     Extreme swelling legs    ROS: Pertinent items noted in HPI and remainder of comprehensive ROS otherwise negative.  HOME MEDS: Current Outpatient Medications  Medication Sig Dispense Refill  . ACCU-CHEK AVIVA PLUS test strip USE AS INSTRUCTED TO CHECK BLOOD SUGAR TWICE A DAY * E11.9* 100 each 5  . ACCU-CHEK FASTCLIX LANCETS MISC USE AS DIRECTED 102 each 3  . acetaminophen (TYLENOL) 500 MG tablet Take 1,000 mg by mouth every 8 (eight) hours as needed.    Marland Kitchen amLODipine (NORVASC) 5 MG tablet TAKE 1 TABLET BY MOUTH ONCE DAILY 90 tablet 1  . B-D UF III MINI PEN NEEDLES 31G X 5 MM MISC USE TO INJECT INSULIN TWICE A DAY 100 each 3  . Blood Glucose Calibration (ACCU-CHEK AVIVA) SOLN Use as directed. 1 each 1  . calcium-vitamin D (OSCAL 500/200 D-3) 500-200 MG-UNIT per tablet Take 1 tablet by mouth daily.    . cloNIDine (CATAPRES) 0.1 MG tablet TAKE 1 TABLET BY MOUTH TWICE A DAY 180 tablet 1  . ferrous sulfate 325 (65 FE) MG tablet Take 325 mg by mouth daily with  breakfast.    . hydrALAZINE (APRESOLINE) 50 MG tablet Take 1 tablet (50 mg total) by mouth 3 (three) times daily. 90 tablet 5  . Insulin Lispro Prot & Lispro (HUMALOG MIX 75/25 KWIKPEN) (75-25) 100 UNIT/ML Kwikpen Inject 15 Units into the skin 2 (two) times daily before a meal. 15 pen 4  . irbesartan (AVAPRO) 75 MG tablet Take 1 tablet (75 mg total) by mouth daily. 30 tablet 5  . isosorbide dinitrate (ISORDIL) 20 MG tablet TAKE 1 TABLET (20 MG TOTAL) BY MOUTH 2 (TWO) TIMES DAILY. 60 tablet 5  . Lancet Devices (ACCU-CHEK SOFTCLIX) lancets Use as instructed twice a day 1 each 5  . levothyroxine (SYNTHROID, LEVOTHROID) 125 MCG tablet TAKE 1 TABLET BY MOUTH EVERY DAY 90 tablet 1  . metFORMIN (GLUCOPHAGE) 1000 MG tablet TAKE 1 TABLET BY MOUTH TWICE DAILY WITH MEALS 180 tablet 1  . Sennosides-Docusate Sodium (STOOL SOFTENER & LAXATIVE PO) Take 1 tablet by mouth daily as needed (constipation).     . simvastatin (ZOCOR) 20 MG tablet TAKE 1 TABLET BY MOUTH EVERY DAY 90 tablet 1  . tiZANidine (ZANAFLEX) 2 MG tablet Take 1 tablet (2 mg total) by mouth every 8 (eight) hours as needed for muscle spasms. 20 tablet 0  . vitamin B-12 (CYANOCOBALAMIN) 1000 MCG tablet Take 1,000 mcg by mouth daily.     Current Facility-Administered Medications  Medication Dose Route Frequency Provider Last Rate Last Dose  . 0.9 %  sodium chloride infusion  500 mL Intravenous Once Pyrtle, Lajuan Lines, MD      . ipratropium-albuterol (DUONEB) 0.5-2.5 (3) MG/3ML nebulizer solution 3 mL  3 mL Nebulization Once Delano Metz, FNP        LABS/IMAGING: No results found for this or any previous visit (from the past 48 hour(s)). No results found.  WEIGHTS: Wt Readings from Last 3 Encounters:  05/08/18 242 lb (109.8 kg)  04/16/18 245 lb 3.2 oz (111.2 kg)  03/26/18 245 lb 14.4 oz (111.5 kg)    VITALS: BP (!) 194/83   Pulse 62  Ht 5' 1.5" (1.562 m)   Wt 242 lb (109.8 kg)   BMI 44.99 kg/m   EXAM: General appearance: alert, no  distress and morbidly obese Neck: no carotid bruit and no JVD Lungs: clear to auscultation bilaterally Heart: regular rate and rhythm Abdomen: soft, non-tender; bowel sounds normal; no masses,  no organomegaly and morbidly obese Extremities: edema trace sockline edema and venous stasis dermatitis noted Pulses: 2+ and symmetric Skin: Skin color, texture, turgor normal. No rashes or lesions Neurologic: Grossly normal Psych: Pleasant  EKG: Deferred  ASSESSMENT: 1. Uncontrolled hypertension 2. Bradycardia 3. Progressive dyspnea and exertion -LVEF 60 to 65%, moderate LVH, grade 1 diastolic dysfunction  4. morbid obesity 5. Dyslipidemia 6. Insulin-dependent diabetes  PLAN: 1.   Katie Mcintyre continues to be short of breath with exertion, I think a lot of this is due to weight and inactivity.  She is having significant problems with back pain and ambulation.  She uses a cane.  Her blood pressure remains uncontrolled.  This is despite increasing her hydralazine.  I recommend increasing her irbesartan up to 150 mg daily.  Creatinine was normal most recently on labs in April.  Follow-up with her primary care provider regarding her hypertension with me in 6 months.  Pixie Casino, MD, [facility], [place] Director of the [facility]  Cardiovascular [facility] Reduction Clinic Diplomate of the American Board of Clinical Lipidology Attending Cardiologist  Direct Dial: [phone]  Fax: [phone]  Website:  [email or web address] Kamerin Axford 05/08/2018, 10:43 AM

## 2018-05-09 ENCOUNTER — Telehealth: Payer: Self-pay | Admitting: *Deleted

## 2018-05-09 LAB — CUP PACEART REMOTE DEVICE CHECK
Date Time Interrogation Session: 20190419093842
MDC IDC PG IMPLANT DT: 20170221

## 2018-05-09 NOTE — Telephone Encounter (Signed)
Patient notified of 06/03/18 in lab sleep study appointment at [facility]. Their contact information given to patient.

## 2018-05-15 ENCOUNTER — Ambulatory Visit (INDEPENDENT_AMBULATORY_CARE_PROVIDER_SITE_OTHER): Payer: Medicare Other | Admitting: *Deleted

## 2018-05-15 DIAGNOSIS — R55 Syncope and collapse: Secondary | ICD-10-CM | POA: Diagnosis not present

## 2018-05-16 NOTE — Progress Notes (Signed)
Carelink Summary Report / Loop Recorder 

## 2018-05-17 ENCOUNTER — Other Ambulatory Visit: Payer: Self-pay | Admitting: Family Medicine

## 2018-05-17 ENCOUNTER — Telehealth: Payer: Self-pay | Admitting: Family Medicine

## 2018-05-17 DIAGNOSIS — M5442 Lumbago with sciatica, left side: Secondary | ICD-10-CM

## 2018-05-17 MED ORDER — TIZANIDINE HCL 2 MG PO TABS: 2.0000 mg | ORAL_TABLET | Freq: Three times a day (TID) | ORAL | 0 refills | Status: DC | PRN

## 2018-05-17 NOTE — Telephone Encounter (Signed)
I called the pt and informed her of the message below.  Patient stated she her pain is the same from the last visit with Dr Maudie Mercury and would like the referral to ortho.  The referral was placed and the pt is aware someone will call with appt info.  She also asked for a refill on the muscle relaxer, Tizanidine 2mg  (last refill 04/16/18-#20 with no refills) and this was sent to Dr Elease Hashimoto to review as Dr Maudie Mercury is out of the office.

## 2018-05-17 NOTE — Telephone Encounter (Signed)
We dont have have a spine specialist in [place]. Canyou inquire what the referral if for? Then you can place ortho referral. You also can let her know about ortho urgent care options if urgent.Thanks.

## 2018-05-17 NOTE — Telephone Encounter (Signed)
Rx done and I called the pt and was unable to leave a message due to voicemail at her cell number being full-no answer at home number.

## 2018-05-17 NOTE — Telephone Encounter (Signed)
Refill once 

## 2018-05-17 NOTE — Telephone Encounter (Signed)
Copied from [place] [id number]. Topic: Referral - Request >> May 17, 2018  8:35 AM Robina Ade, Helene Kelp D wrote: Reason for CRM: Patient called and would like a referral to see a spine doctor with in [place] is possible. Please call patient back, thanks.

## 2018-05-18 IMAGING — DX DG CHEST 2V
2 series · 2 of 2 positions shown · non-contrast
Comparison: 11/04/2013 .

CLINICAL DATA: Cough and wheezing.

EXAM:
CHEST  2 VIEW

[chest pa]
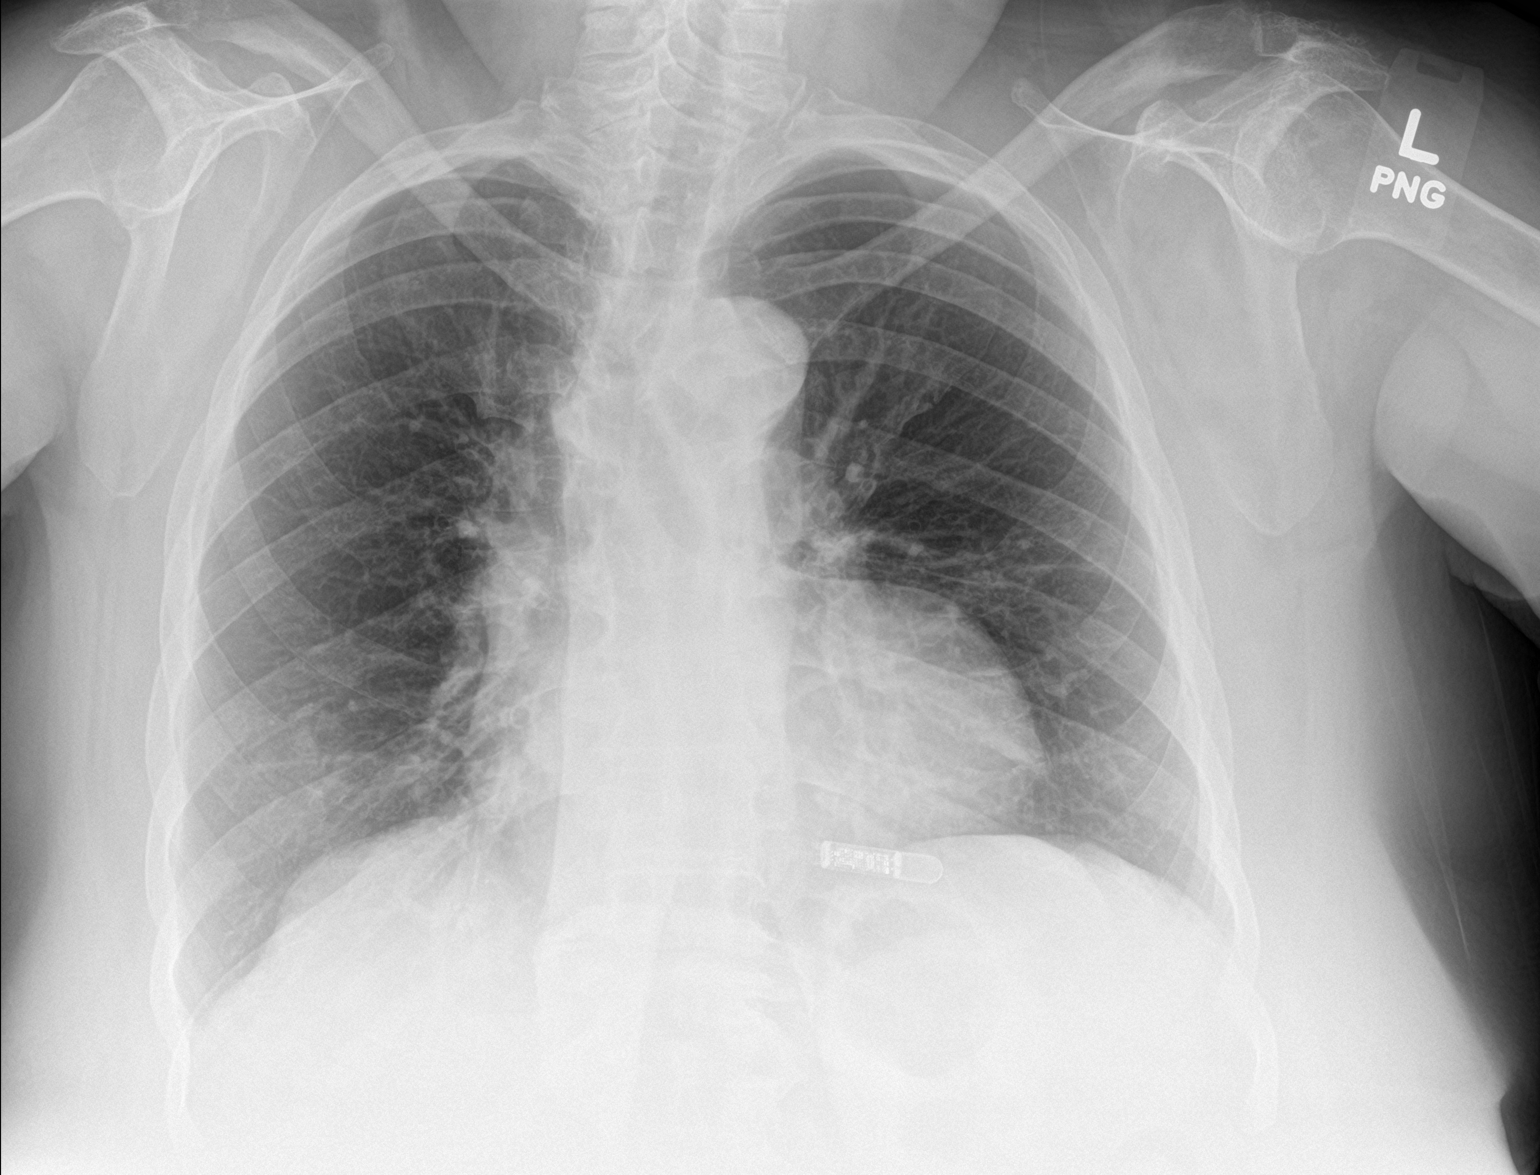

[chest lat]
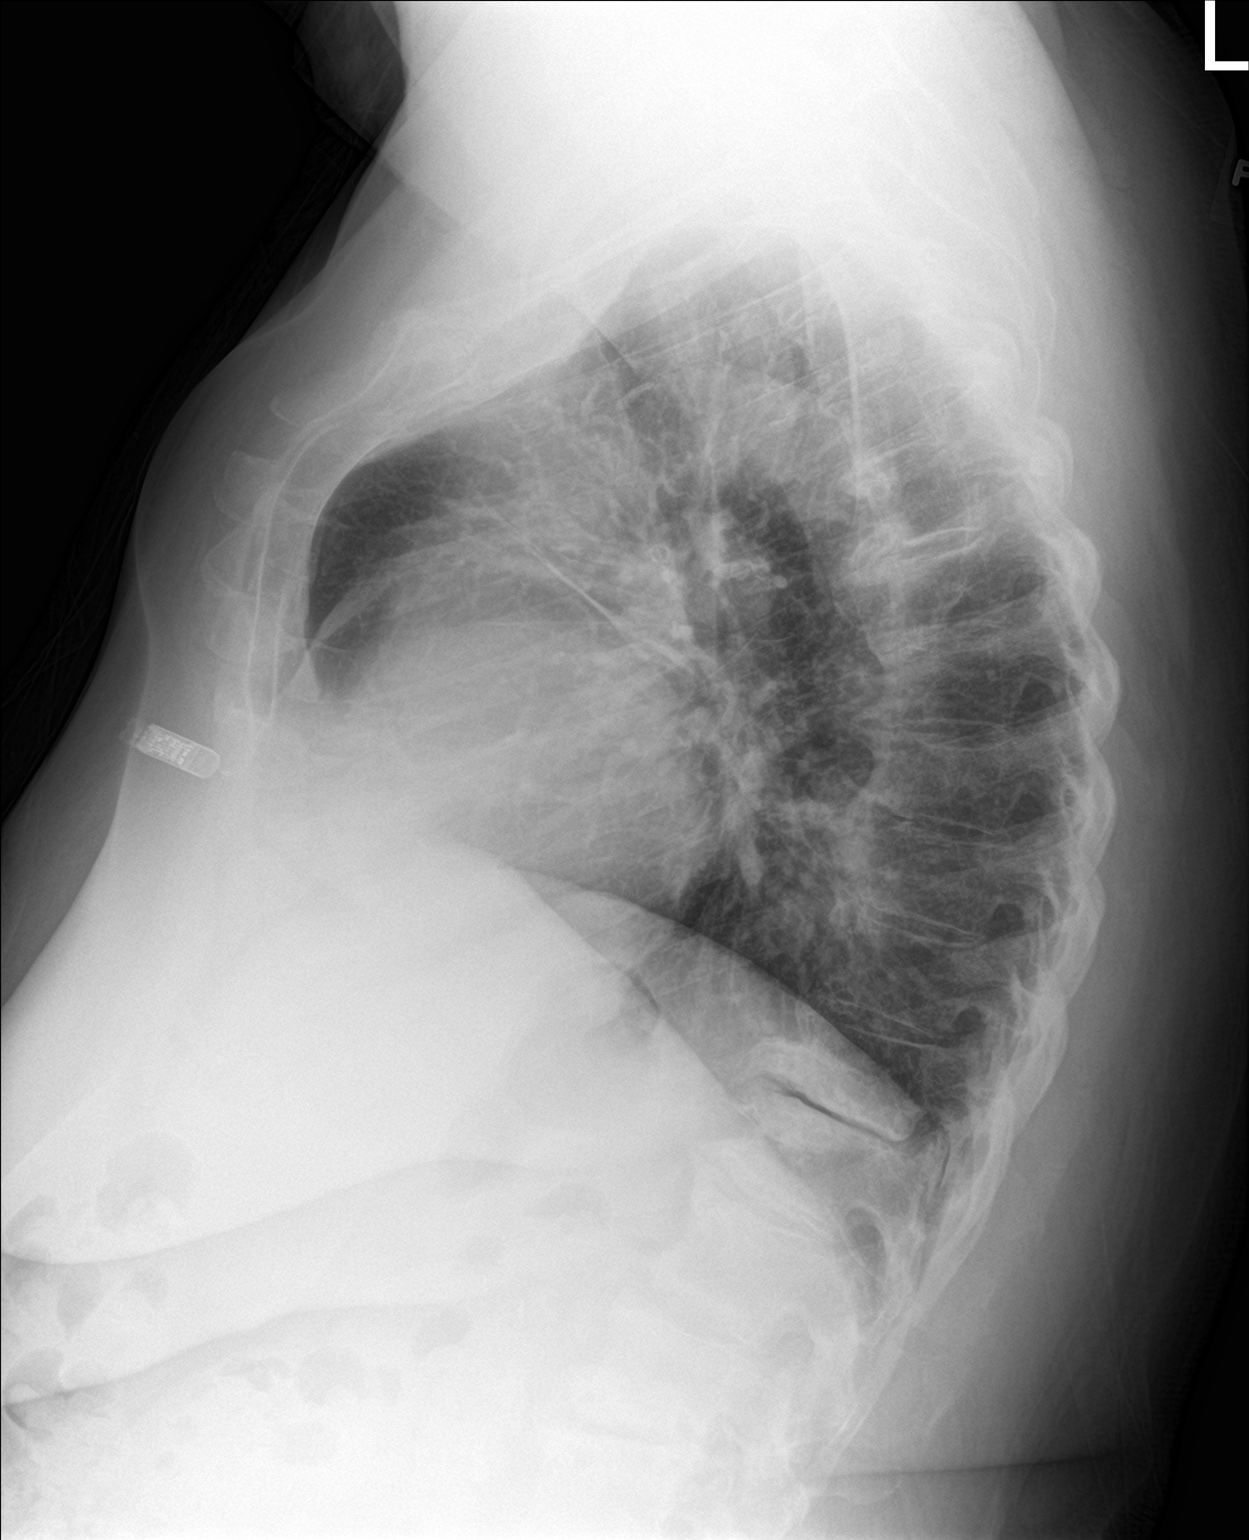

[2 of 2 positions shown; findings below may reference images not displayed]

FINDINGS: Mediastinum and hilar structures are normal. Cardiomegaly with
normal pulmonary vascularity. Cardiac monitor noted. Mild basilar
subsegmental atelectasis . Degenerative changes thoracic spine.
IMPRESSION: 1. Cardiomegaly.

2.  Mild basilar subsegmental atelectasis.

## 2018-05-21 ENCOUNTER — Other Ambulatory Visit: Payer: Medicare Other

## 2018-05-24 ENCOUNTER — Ambulatory Visit: Payer: Medicare Other | Admitting: Podiatry

## 2018-05-24 ENCOUNTER — Encounter: Payer: Self-pay | Admitting: Podiatry

## 2018-05-24 DIAGNOSIS — E1142 Type 2 diabetes mellitus with diabetic polyneuropathy: Secondary | ICD-10-CM | POA: Diagnosis not present

## 2018-05-24 DIAGNOSIS — B351 Tinea unguium: Secondary | ICD-10-CM | POA: Diagnosis not present

## 2018-05-24 DIAGNOSIS — M79609 Pain in unspecified limb: Secondary | ICD-10-CM

## 2018-05-27 NOTE — Progress Notes (Signed)
Subjective:   Patient ID: Katie Mcintyre, female   DOB: 77 y.o.   MRN: [id number]   HPI Patient presents with elongated nailbeds 1-5 both feet that are thick yellow brittle and can become painful   ROS      Objective:  Physical Exam  Mycotic nail infection with pain 1-5 both feet     Assessment:  Chronic mycotic nail infection that are painful and she cannot cut 1-5 both feet     Plan:  Debride painful nailbeds 1-5 both feet with no iatrogenic bleeding noted

## 2018-05-28 ENCOUNTER — Other Ambulatory Visit: Payer: Self-pay | Admitting: Family Medicine

## 2018-05-28 NOTE — Telephone Encounter (Signed)
Rx not on the pts current med list? 

## 2018-05-29 NOTE — Telephone Encounter (Signed)
Please call pt to see what she uses this for? Ok to refill if hx of asthma, uses rarely for classic asthma symptoms with sllergies or cold. But if sick or significant symptoms should be seen.

## 2018-05-30 NOTE — Telephone Encounter (Signed)
I called the pt, informed her of the message below and she stated she does not use an inhaler.  Rx declined and sent to the pharmacy.

## 2018-06-03 ENCOUNTER — Encounter (HOSPITAL_BASED_OUTPATIENT_CLINIC_OR_DEPARTMENT_OTHER): Payer: Medicare Other

## 2018-06-10 LAB — CUP PACEART REMOTE DEVICE CHECK
Date Time Interrogation Session: 20190522124044
Implantable Pulse Generator Implant Date: 20170221

## 2018-06-17 ENCOUNTER — Ambulatory Visit (INDEPENDENT_AMBULATORY_CARE_PROVIDER_SITE_OTHER): Payer: Medicare Other | Admitting: *Deleted

## 2018-06-17 DIAGNOSIS — R55 Syncope and collapse: Secondary | ICD-10-CM

## 2018-06-17 NOTE — Progress Notes (Signed)
Carelink Summary Report / Loop Recorder 

## 2018-06-18 ENCOUNTER — Telehealth: Payer: Self-pay | Admitting: *Deleted

## 2018-06-18 ENCOUNTER — Other Ambulatory Visit: Payer: Self-pay | Admitting: Family Medicine

## 2018-06-18 NOTE — Telephone Encounter (Signed)
Spoke with patient regarding sending manual transmission to review 3 "AF" episodes. Patient verbalized understanding. [facility] phone number to call back if she needs assistance.

## 2018-06-21 NOTE — Telephone Encounter (Signed)
Received Manual Transmission. AF episodes appear SR with PACs. Will place in Dr. Victorino December folder for review.

## 2018-06-24 NOTE — Progress Notes (Deleted)
HPI:  Using dictation device. Unfortunately this device frequently misinterprets words/phrases.  Katie Mcintyre is a pleasant 77 y.o. here for follow up. Chronic medical problems summarized below were reviewed for changes and stability and were updated as needed below. These issues and their treatment remain stable for the most part. ***. Denies CP, SOB, DOE, treatment intolerance or new symptoms. Diabetes was not at goal on last check, 8.0 hgba1c. We advised her to titrate her insulin and call us weekly to assist. Reports. Denies. Annual wellness visit was 11/06/17 Due for foot exam ,? Dexa, labs  DM: -circulatory complications -meds: metformin, asa, losartan, mixed humolog 75/25 13units bid -switched to mixed insulin 04/2015 -Opthomologist: seesophthalmology every 4 months per her report -podiatrist is Dr. Amalia Hailey  HLD/Obesity: -meds simvastatin 20 -poor lifestyle  HTN/Chronic LE edema: -meds: isosorbide, hydralazine, clonidine, metoprolol, irbesartan, norvasc -chronic LE edema intermittently - non-compliant with compression  Hypothyroid:  -on levothyroxine  Anemia:  -on iron therapy from prior PCP -hx of polyps, sees GI  Hx Gout: -on allopurinol from prior PCP  Hx ofDepression: resolved -meds: none currently  Hx Pre-syncope/AMS events: -s/p eval with neurology and seeing cardiology in 2017 with Echo, has implanted loop recorder -on review OSH records hx abnormal EKG,LAFB in the past per notes  Chronic back pain/lumbar radiculopathy and spinal stenosis: -seeing Dr. Marcos Eke at [facility] for this -walks with cane    ROS: See pertinent positives and negatives per HPI.  Past Medical History:  Diagnosis Date  . Anemia   . Arthritis   . Cancer ([place]) 1969   , Status post complete hysterectomy per patient  . Cataract    removed both eyes with lens implant both   . Chicken pox   . Chronic bronchitis ([place])   . Colon polyp   . Depression 06/30/2013  .  Diabetes mellitus without complication ([place])   . Diverticulitis   . Frequent headaches   . GERD (gastroesophageal reflux disease)   . Gout 06/30/2013  . Hx of dizziness   . Hyperlipidemia   . Hypertension   . Post-operative nausea and vomiting   . S/P epidural steroid injection 09/27/2016   done in back at [place]   . Spinal stenosis of lumbar region    s/p spine surgery 02/12/13 with Dr Marcos [facility]ke at [facility]  . Thyroid disease   . Urine incontinence   . Use of cane as ambulatory aid     Past Surgical History:  Procedure Laterality Date  . ABDOMINAL HYSTERECTOMY  1970   complete hysterectomy for cervical cancer  . BACK SURGERY  01/2013  . BACK SURGERY  1995  . CATARACT EXTRACTION, BILATERAL     with lens implant   . CERVICAL CONE BIOPSY  1970  . COLONOSCOPY    . EP IMPLANTABLE DEVICE N/A 02/15/2016   Procedure: Loop Recorder Insertion;  Surgeon: Sanda Klein, MD;  Location: [place] [facility];  Service: Cardiovascular;  Laterality: N/A;  . POLYPECTOMY    . WRIST SURGERY  2010   for wrist fracture    Family History  Problem Relation Age of Onset  . Alcohol abuse Father   . Cirrhosis Father   . Heart attack Brother   . Breast cancer Sister        Reports all family members with breast cancer have had genetic testing that was negative  . Cancer Mother        renal cancer  . Stroke Paternal Grandmother   . Alcohol abuse Brother   .  Breast cancer Sister        renal cancer  . Diabetes Brother   . Heart Problems Daughter   . Healthy Daughter   . Healthy Daughter   . Healthy Son   . Healthy Son   . Arthritis Unknown        parents  . Hyperlipidemia Unknown        parent  . Hypertension Unknown        parent/grandparent  . Diabetes Unknown        parent/grandparent  . Heart disease Other   . Mental illness Other   . Colon cancer Neg Hx   . Esophageal cancer Neg Hx   . Prostate cancer Neg Hx   . Rectal cancer Neg Hx   . Seizures Neg Hx   . Colon polyps Neg Hx      SOCIAL HX: ***   Current Outpatient Medications:  .  ACCU-CHEK AVIVA PLUS test strip, USE AS INSTRUCTED TO CHECK BLOOD SUGAR TWICE A DAY * E11.9*, Disp: 100 each, Rfl: 5 .  ACCU-CHEK FASTCLIX LANCETS MISC, USE AS DIRECTED, Disp: 102 each, Rfl: 3 .  acetaminophen (TYLENOL) 500 MG tablet, Take 1,000 mg by mouth every 8 (eight) hours as needed., Disp: , Rfl:  .  amLODipine (NORVASC) 5 MG tablet, TAKE 1 TABLET BY MOUTH ONCE DAILY, Disp: 90 tablet, Rfl: 1 .  B-D UF III MINI PEN NEEDLES 31G X 5 MM MISC, USE TO INJECT INSULIN TWICE A DAY, Disp: 100 each, Rfl: 3 .  Blood Glucose Calibration (ACCU-CHEK AVIVA) SOLN, Use as directed., Disp: 1 each, Rfl: 1 .  calcium-vitamin D (OSCAL 500/200 D-3) 500-200 MG-UNIT per tablet, Take 1 tablet by mouth daily., Disp: , Rfl:  .  cloNIDine (CATAPRES) 0.1 MG tablet, TAKE 1 TABLET BY MOUTH TWICE A DAY, Disp: 180 tablet, Rfl: 1 .  ferrous sulfate 325 (65 FE) MG tablet, Take 325 mg by mouth daily with breakfast., Disp: , Rfl:  .  hydrALAZINE (APRESOLINE) 50 MG tablet, Take 1 tablet (50 mg total) by mouth 3 (three) times daily., Disp: 90 tablet, Rfl: 5 .  Insulin Lispro Prot & Lispro (HUMALOG MIX 75/25 KWIKPEN) (75-25) 100 UNIT/ML Kwikpen, Inject 15 Units into the skin 2 (two) times daily before a meal., Disp: 15 pen, Rfl: 4 .  irbesartan (AVAPRO) 150 MG tablet, Take 1 tablet (150 mg total) by mouth daily., Disp: 90 tablet, Rfl: 3 .  isosorbide dinitrate (ISORDIL) 20 MG tablet, TAKE 1 TABLET (20 MG TOTAL) BY MOUTH 2 (TWO) TIMES DAILY., Disp: 60 tablet, Rfl: 5 .  isosorbide dinitrate (ISORDIL) 20 MG tablet, TAKE 1 TABLET (20 MG TOTAL) BY MOUTH 2 (TWO) TIMES DAILY., Disp: 180 tablet, Rfl: 1 .  Lancet Devices (ACCU-CHEK SOFTCLIX) lancets, Use as instructed twice a day, Disp: 1 each, Rfl: 5 .  levothyroxine (SYNTHROID, LEVOTHROID) 125 MCG tablet, TAKE 1 TABLET BY MOUTH EVERY DAY, Disp: 90 tablet, Rfl: 1 .  metFORMIN (GLUCOPHAGE) 1000 MG tablet, TAKE 1 TABLET BY MOUTH  TWICE DAILY WITH MEALS, Disp: 180 tablet, Rfl: 1 .  Sennosides-Docusate Sodium (STOOL SOFTENER & LAXATIVE PO), Take 1 tablet by mouth daily as needed (constipation). , Disp: , Rfl:  .  simvastatin (ZOCOR) 20 MG tablet, TAKE 1 TABLET BY MOUTH EVERY DAY, Disp: 90 tablet, Rfl: 1 .  tiZANidine (ZANAFLEX) 2 MG tablet, Take 1 tablet (2 mg total) by mouth every 8 (eight) hours as needed for muscle spasms., Disp: 20 tablet, Rfl: 0 .  vitamin B-12 (CYANOCOBALAMIN) 1000 MCG tablet, Take 1,000 mcg by mouth daily., Disp: , Rfl:   Current Facility-Administered Medications:  .  0.9 %  sodium chloride infusion, 500 mL, Intravenous, Once, Pyrtle, Lajuan Lines, MD .  ipratropium-albuterol (DUONEB) 0.5-2.5 (3) MG/3ML nebulizer solution 3 mL, 3 mL, Nebulization, Once, Kordsmeier, Gregary Signs, FNP  EXAM:  There were no vitals filed for this visit.  There is no height or weight on file to calculate BMI.  GENERAL: vitals reviewed and listed above, alert, oriented, appears well hydrated and in no acute distress  HEENT: atraumatic, conjunttiva clear, no obvious abnormalities on inspection of external nose and ears  NECK: no obvious masses on inspection  LUNGS: clear to auscultation bilaterally, no wheezes, rales or rhonchi, good air movement  CV: HRRR, no peripheral edema  MS: moves all extremities without noticeable abnormality *** PSYCH: pleasant and cooperative, no obvious depression or anxiety  ASSESSMENT AND PLAN:  Discussed the following assessment and plan:  No diagnosis found.  *** -Patient advised to return or notify a doctor immediately if symptoms worsen or persist or new concerns arise.  There are no Patient Instructions on file for this visit.  [name], DO

## 2018-06-25 ENCOUNTER — Ambulatory Visit: Payer: Medicare Other | Admitting: Family Medicine

## 2018-07-07 NOTE — Progress Notes (Signed)
All HPI:  Using dictation device. Unfortunately this device frequently misinterprets words/phrases.  Katie Mcintyre is a pleasant 77 y.o. here for follow up. Chronic medical problems summarized below were reviewed for changes and stability and were updated as needed below. These issues and their treatment remain stable for the most part.  She is chronically fatigued and in pain. Will be seeing new back specialist in [place] - but is waiting on records transfer. She wants a motorized scooter to use when traveling or out. Reports she doesn't want to do the sleep study test her cardiologist advised. She has rescheduled it 3 times. BS in the low 100s - reports taking 13-14 units daily. Denies any low blood sugars. No regular exercise.Denies CP, SOB, DOE, treatment intolerance or new symptoms.  AWV 10/27/17  Diabetes: -insulin dependent -meds: metformin, humolog mix 75/35 13-14 units -podiatrist: Dr. Amalia Mcintyre, Katie Mcintyre -opthomologist:   Morbid obesity: -poor diet, no exercise -felt best when she was getting regular exercise -caregiver for many in her family  Hypothyroidism: -meds: synthroid  Hx syncope, presyncope, bradycardia, HTN, HLD, Dyspnea, Chronic LE edema: -sees cardiology and neurology -meds: norvasc, clonidine, hydralazine, ibesartan - recently increased by her cardiologist, isordil, simvastatin -s/p implantable loop recorder -echo 2119: grade 1 diastolic dysfunction -cardiology ordered sleep study -she saw neurology in 2017 with extensive eval for the syncope/AMS w/ EEG and MRI  Chronic back pain: -hx surgery at [place] with Dr. Marcos Mcintyre -has had injections  Mild recurrent depression: -no medications  Hx iron def aneima: -chronic, takes iron chronically from prior PCP -hx colon polyps -last colonosocpy 02/2018 -also b12 def, on oral supplementation  ROS: See pertinent positives and negatives per HPI.  Past Medical History:  Diagnosis Date  . Anemia   . Arthritis    . Cancer (Katie Mcintyre) 1969   , Status post complete hysterectomy per patient  . Cataract    removed both eyes with lens implant both   . Chicken pox   . Chronic bronchitis ([place])   . Colon polyp   . Depression 06/30/2013  . Diabetes mellitus without complication ([place])   . Diverticulitis   . Frequent headaches   . GERD (gastroesophageal reflux disease)   . Gout 06/30/2013  . Hx of dizziness   . Hyperlipidemia   . Hypertension   . Post-operative nausea and vomiting   . S/P epidural steroid injection 09/27/2016   done in back at [place]   . Spinal stenosis of lumbar region    s/p spine surgery 02/12/13 with Dr Katie Mcintyre at [facility]  . Thyroid disease   . Urine incontinence   . Use of cane as ambulatory aid     Past Surgical History:  Procedure Laterality Date  . ABDOMINAL HYSTERECTOMY  1970   complete hysterectomy for cervical cancer  . BACK SURGERY  01/2013  . BACK SURGERY  1995  . CATARACT EXTRACTION, BILATERAL     with lens implant   . CERVICAL CONE BIOPSY  1970  . COLONOSCOPY    . EP IMPLANTABLE DEVICE N/A 02/15/2016   Procedure: Loop Recorder Insertion;  Surgeon: Katie Klein, MD;  Location: [place] [facility];  Service: Cardiovascular;  Laterality: N/A;  . POLYPECTOMY    . WRIST SURGERY  2010   for wrist fracture    Family History  Problem Relation Age of Onset  . Alcohol abuse Father   . Cirrhosis Father   . Heart attack Brother   . Breast cancer Sister  Reports all family members with breast cancer have had genetic testing that was negative  . Cancer Mother        renal cancer  . Stroke Paternal Grandmother   . Alcohol abuse Brother   . Breast cancer Sister        renal cancer  . Diabetes Brother   . Heart Problems Daughter   . Healthy Daughter   . Healthy Daughter   . Healthy Son   . Healthy Son   . Arthritis Unknown        parents  . Hyperlipidemia Unknown        parent  . Hypertension Unknown        parent/grandparent  . Diabetes Unknown         parent/grandparent  . Heart disease Other   . Mental illness Other   . Colon cancer Neg Hx   . Esophageal cancer Neg Hx   . Prostate cancer Neg Hx   . Rectal cancer Neg Hx   . Seizures Neg Hx   . Colon polyps Neg Hx     SOCIAL HX: see hpi   Current Outpatient Medications:  .  ACCU-CHEK AVIVA PLUS test strip, USE AS INSTRUCTED TO CHECK BLOOD SUGAR TWICE A DAY * E11.9*, Disp: 100 each, Rfl: 5 .  ACCU-CHEK FASTCLIX LANCETS MISC, USE AS DIRECTED, Disp: 102 each, Rfl: 3 .  acetaminophen (TYLENOL) 500 MG tablet, Take 1,000 mg by mouth every 8 (eight) hours as needed., Disp: , Rfl:  .  amLODipine (NORVASC) 5 MG tablet, TAKE 1 TABLET BY MOUTH ONCE DAILY, Disp: 90 tablet, Rfl: 1 .  B-D UF III MINI PEN NEEDLES 31G X 5 MM MISC, USE TO INJECT INSULIN TWICE A DAY, Disp: 100 each, Rfl: 3 .  Blood Glucose Calibration (ACCU-CHEK AVIVA) SOLN, Use as directed., Disp: 1 each, Rfl: 1 .  calcium-vitamin D (OSCAL 500/200 D-3) 500-200 MG-UNIT per tablet, Take 1 tablet by mouth daily., Disp: , Rfl:  .  cloNIDine (CATAPRES) 0.1 MG tablet, TAKE 1 TABLET BY MOUTH TWICE A DAY, Disp: 180 tablet, Rfl: 1 .  ferrous sulfate 325 (65 FE) MG tablet, Take 325 mg by mouth daily with breakfast., Disp: , Rfl:  .  hydrALAZINE (APRESOLINE) 50 MG tablet, Take 1 tablet (50 mg total) by mouth 3 (three) times daily., Disp: 90 tablet, Rfl: 5 .  Insulin Lispro Prot & Lispro (HUMALOG MIX 75/25 KWIKPEN) (75-25) 100 UNIT/ML Kwikpen, Inject 15 Units into the skin 2 (two) times daily before a meal., Disp: 15 pen, Rfl: 4 .  irbesartan (AVAPRO) 150 MG tablet, Take 1 tablet (150 mg total) by mouth daily., Disp: 90 tablet, Rfl: 3 .  isosorbide dinitrate (ISORDIL) 20 MG tablet, TAKE 1 TABLET (20 MG TOTAL) BY MOUTH 2 (TWO) TIMES DAILY., Disp: 60 tablet, Rfl: 5 .  isosorbide dinitrate (ISORDIL) 20 MG tablet, TAKE 1 TABLET (20 MG TOTAL) BY MOUTH 2 (TWO) TIMES DAILY., Disp: 180 tablet, Rfl: 1 .  Lancet Devices (ACCU-CHEK SOFTCLIX) lancets, Use as  instructed twice a day, Disp: 1 each, Rfl: 5 .  levothyroxine (SYNTHROID, LEVOTHROID) 125 MCG tablet, TAKE 1 TABLET BY MOUTH EVERY DAY, Disp: 90 tablet, Rfl: 1 .  metFORMIN (GLUCOPHAGE) 1000 MG tablet, TAKE 1 TABLET BY MOUTH TWICE DAILY WITH MEALS, Disp: 180 tablet, Rfl: 1 .  Sennosides-Docusate Sodium (STOOL SOFTENER & LAXATIVE PO), Take 1 tablet by mouth daily as needed (constipation). , Disp: , Rfl:  .  simvastatin (ZOCOR) 20 MG tablet, TAKE  1 TABLET BY MOUTH EVERY DAY, Disp: 90 tablet, Rfl: 1 .  tiZANidine (ZANAFLEX) 2 MG tablet, Take 1 tablet (2 mg total) by mouth every 8 (eight) hours as needed for muscle spasms., Disp: 20 tablet, Rfl: 0 .  vitamin B-12 (CYANOCOBALAMIN) 1000 MCG tablet, Take 1,000 mcg by mouth daily., Disp: , Rfl:   Current Facility-Administered Medications:  .  0.9 %  sodium chloride infusion, 500 mL, Intravenous, Once, Pyrtle, Lajuan Lines, MD .  ipratropium-albuterol (DUONEB) 0.5-2.5 (3) MG/3ML nebulizer solution 3 mL, 3 mL, Nebulization, Once, Kordsmeier, Gregary Signs, FNP  EXAM:  Vitals:   07/08/18 1053  BP: 140/60  Pulse: 64  Temp: (!) 97.4 F (36.3 C)  SpO2: 98%    Body mass index is 46.12 kg/m.  GENERAL: vitals reviewed and listed above, alert, oriented, appears well hydrated and in no acute distress  HEENT: atraumatic, conjunttiva clear, no obvious abnormalities on inspection of external nose and ears  NECK: no obvious masses on inspection  LUNGS: clear to auscultation bilaterally, no wheezes, rales or rhonchi, good air movement  CV: HRRR, no peripheral edema  MS: moves all extremities without noticeable abnormality  PSYCH: pleasant and cooperative, no obvious depression or anxiety  ASSESSMENT AND PLAN:  Discussed the following assessment and plan:  Hypertension associated with diabetes ([place]) - Plan: Basic metabolic panel, CBC  Type 2 diabetes mellitus with other circulatory complication, with long-term current use of insulin (HCC) - Plan: Hemoglobin  A1c  Hypothyroidism, unspecified type - Plan: TSH  Iron deficiency anemia, unspecified iron deficiency anemia type  B12 deficiency - Plan: Vitamin B12  Mild episode of recurrent major depressive disorder (HCC)  -labs per orders -encouraged increasing activity and advised working with specialist for medical need for scooter if she feels needs this as she actually seemed to do better with regular exercise in the past -advised completion sleep study as if OSA may feel much better with treatment -home bs ok, will check labs -follow up and AWV om 4  months -Patient advised to return if  new concerns arise.  Patient Instructions  BEFORE YOU LEAVE: -labs -follow up: AWV with [name] and follow up with Dr. Maudie Mercury in  4 months  We have ordered labs or studies at this visit. It can take up to 1-2 weeks for results and processing. IF results require follow up or explanation, we will call you with instructions. Clinically stable results will be released to your [facility]. If you have not heard from [place] or cannot find your results in [facility] in 2 weeks please contact our office at [phone].  If you are not yet signed up for [facility], please consider signing up.  Please get the sleep study  Please eat a healthy low sugar diet and get regular exercise         [name], DO

## 2018-07-08 ENCOUNTER — Encounter: Payer: Self-pay | Admitting: Family Medicine

## 2018-07-08 ENCOUNTER — Ambulatory Visit: Payer: Medicare Other | Admitting: Family Medicine

## 2018-07-08 VITALS — BP 140/60 | HR 64 | Temp 97.4°F | Ht 61.5 in | Wt 248.1 lb

## 2018-07-08 DIAGNOSIS — E039 Hypothyroidism, unspecified: Secondary | ICD-10-CM | POA: Diagnosis not present

## 2018-07-08 DIAGNOSIS — D509 Iron deficiency anemia, unspecified: Secondary | ICD-10-CM | POA: Diagnosis not present

## 2018-07-08 DIAGNOSIS — F330 Major depressive disorder, recurrent, mild: Secondary | ICD-10-CM

## 2018-07-08 DIAGNOSIS — E1159 Type 2 diabetes mellitus with other circulatory complications: Secondary | ICD-10-CM | POA: Diagnosis not present

## 2018-07-08 DIAGNOSIS — I152 Hypertension secondary to endocrine disorders: Secondary | ICD-10-CM

## 2018-07-08 DIAGNOSIS — Z794 Long term (current) use of insulin: Secondary | ICD-10-CM

## 2018-07-08 DIAGNOSIS — E538 Deficiency of other specified B group vitamins: Secondary | ICD-10-CM | POA: Diagnosis not present

## 2018-07-08 DIAGNOSIS — I10 Essential (primary) hypertension: Secondary | ICD-10-CM | POA: Diagnosis not present

## 2018-07-08 LAB — CBC
HCT: 36.1 % (ref 36.0–46.0)
HEMOGLOBIN: 12 g/dL (ref 12.0–15.0)
MCHC: 33.1 g/dL (ref 30.0–36.0)
MCV: 85.8 fl (ref 78.0–100.0)
PLATELETS: 278 10*3/uL (ref 150.0–400.0)
RBC: 4.21 Mil/uL (ref 3.87–5.11)
RDW: 15.1 % (ref 11.5–15.5)
WBC: 4.9 10*3/uL (ref 4.0–10.5)

## 2018-07-08 LAB — BASIC METABOLIC PANEL
BUN: 22 mg/dL (ref 6–23)
CO2: 23 mEq/L (ref 19–32)
Calcium: 9.4 mg/dL (ref 8.4–10.5)
Chloride: 104 mEq/L (ref 96–112)
Creatinine, Ser: 1 mg/dL (ref 0.40–1.20)
GFR: 68.91 mL/min (ref >60.00)
GLUCOSE: 102 mg/dL — AB (ref 70–99)
POTASSIUM: 4.4 meq/L (ref 3.5–5.1)
SODIUM: 138 meq/L (ref 135–145)

## 2018-07-08 LAB — TSH: TSH: 1.53 u[IU]/mL (ref 0.35–4.50)

## 2018-07-08 LAB — HEMOGLOBIN A1C: Hgb A1c MFr Bld: 7.9 % — ABNORMAL HIGH (ref 4.6–6.5)

## 2018-07-08 LAB — VITAMIN B12: VITAMIN B 12: 839 pg/mL (ref 211–911)

## 2018-07-08 NOTE — Patient Instructions (Signed)
BEFORE YOU LEAVE: -labs -follow up: AWV with [name] and follow up with Dr. Maudie Mercury in  4 months  We have ordered labs or studies at this visit. It can take up to 1-2 weeks for results and processing. IF results require follow up or explanation, we will call you with instructions. Clinically stable results will be released to your [facility]. If you have not heard from [place] or cannot find your results in [facility] in 2 weeks please contact our office at [phone].  If you are not yet signed up for [facility], please consider signing up.  Please get the sleep study  Please eat a healthy low sugar diet and get regular exercise

## 2018-07-12 ENCOUNTER — Encounter (HOSPITAL_BASED_OUTPATIENT_CLINIC_OR_DEPARTMENT_OTHER): Payer: Medicare Other

## 2018-07-15 ENCOUNTER — Telehealth (INDEPENDENT_AMBULATORY_CARE_PROVIDER_SITE_OTHER): Payer: Self-pay | Admitting: Orthopaedic Surgery

## 2018-07-15 NOTE — Telephone Encounter (Signed)
I re faxed patients authorization for release of records to [facility]

## 2018-07-17 ENCOUNTER — Other Ambulatory Visit: Payer: Self-pay | Admitting: Cardiovascular Disease

## 2018-07-18 ENCOUNTER — Other Ambulatory Visit: Payer: Self-pay | Admitting: Cardiovascular Disease

## 2018-07-22 ENCOUNTER — Ambulatory Visit (INDEPENDENT_AMBULATORY_CARE_PROVIDER_SITE_OTHER): Payer: Medicare Other | Admitting: *Deleted

## 2018-07-22 DIAGNOSIS — R55 Syncope and collapse: Secondary | ICD-10-CM | POA: Diagnosis not present

## 2018-07-22 NOTE — Progress Notes (Signed)
Carelink Summary Report / Loop Recorder 

## 2018-07-31 ENCOUNTER — Other Ambulatory Visit: Payer: Self-pay | Admitting: Family Medicine

## 2018-08-01 LAB — CUP PACEART REMOTE DEVICE CHECK
MDC IDC PG IMPLANT DT: 20170221
MDC IDC SESS DTM: 20190624123734

## 2018-08-12 ENCOUNTER — Other Ambulatory Visit: Payer: Self-pay | Admitting: *Deleted

## 2018-08-12 MED ORDER — ACCU-CHEK FASTCLIX LANCETS MISC: 5 refills | Status: AC

## 2018-08-12 NOTE — Telephone Encounter (Signed)
Rx done. 

## 2018-08-16 ENCOUNTER — Encounter (HOSPITAL_BASED_OUTPATIENT_CLINIC_OR_DEPARTMENT_OTHER): Payer: Medicare Other

## 2018-08-21 ENCOUNTER — Ambulatory Visit (INDEPENDENT_AMBULATORY_CARE_PROVIDER_SITE_OTHER): Payer: Medicare Other | Admitting: Orthopaedic Surgery

## 2018-08-22 ENCOUNTER — Ambulatory Visit (INDEPENDENT_AMBULATORY_CARE_PROVIDER_SITE_OTHER): Payer: Medicare Other | Admitting: *Deleted

## 2018-08-22 DIAGNOSIS — R55 Syncope and collapse: Secondary | ICD-10-CM

## 2018-08-23 ENCOUNTER — Telehealth: Payer: Self-pay | Admitting: *Deleted

## 2018-08-23 ENCOUNTER — Other Ambulatory Visit: Payer: Medicare Other

## 2018-08-23 NOTE — Telephone Encounter (Signed)
Spoke with patient to request manual Care[facility] for review. Patient agrees to transmit this morning.  Transmission received. 3 "AF" episodes appear ST w/PACs, though discrete P-waves are difficult to discern on some episodes (nonsustained disorganized atrial arrhythmia). Examples of recent episodes are included below. Routed to Dr. Sallyanne Kuster for review. Full report placed in Dr. Langley Gauss folder for review.

## 2018-08-23 NOTE — Telephone Encounter (Signed)
Thank you.  I think you can still discern that there is an underlying sinus rhythm on both the EGM and on the plot.  I do not think this is atrial fibrillation.

## 2018-08-23 NOTE — Progress Notes (Signed)
Carelink Summary Report / Loop Recorder 

## 2018-08-29 ENCOUNTER — Encounter (INDEPENDENT_AMBULATORY_CARE_PROVIDER_SITE_OTHER): Payer: Self-pay | Admitting: Specialist

## 2018-08-29 ENCOUNTER — Ambulatory Visit (INDEPENDENT_AMBULATORY_CARE_PROVIDER_SITE_OTHER): Payer: Self-pay

## 2018-08-29 ENCOUNTER — Ambulatory Visit (INDEPENDENT_AMBULATORY_CARE_PROVIDER_SITE_OTHER): Payer: Medicare Other | Admitting: Specialist

## 2018-08-29 VITALS — BP 169/71 | HR 64 | Ht 61.5 in | Wt 250.0 lb

## 2018-08-29 DIAGNOSIS — M4156 Other secondary scoliosis, lumbar region: Secondary | ICD-10-CM

## 2018-08-29 DIAGNOSIS — M4316 Spondylolisthesis, lumbar region: Secondary | ICD-10-CM

## 2018-08-29 DIAGNOSIS — M545 Low back pain: Secondary | ICD-10-CM | POA: Diagnosis not present

## 2018-08-29 DIAGNOSIS — M48062 Spinal stenosis, lumbar region with neurogenic claudication: Secondary | ICD-10-CM | POA: Diagnosis not present

## 2018-08-29 MED ORDER — PREDNISONE 5 MG PO TABS: ORAL_TABLET | ORAL | 0 refills | Status: AC

## 2018-08-29 MED ORDER — GABAPENTIN 100 MG PO CAPS: 100.0000 mg | ORAL_CAPSULE | Freq: Every day | ORAL | 3 refills | Status: DC

## 2018-08-29 NOTE — Patient Instructions (Addendum)
Avoid bending, stooping and avoid lifting weights greater than 10 lbs. Avoid prolong standing and walking. Avoid frequent bending and stooping  No lifting greater than 10 lbs. May use ice or moist heat for pain. Weight loss is of benefit. Handicap license is approved. Dr. Romona Curls secretary/Assistant will call to arrange for epidural steroid injection  Start gabapentin to decrease nerve pain in your left arm and your legs. Start prednisone to decrease swelling and inflamation in your neck and lumbar joints. May stop with the steriod injection in your spine.  Tylenol in the form of equate up to 6 tablets per day rarely 8 tablets in a 24 hour period.

## 2018-08-29 NOTE — Progress Notes (Signed)
Office Visit Note   [name]           Date of Birth: 1941/07/23           MRN: [id number] Visit Date: 08/29/2018              Requested by: [name], DO [address] [place], [place] [address] PCP: [name], DO   Assessment & Plan: Visit Diagnoses:  1. Low back pain, unspecified back pain laterality, unspecified chronicity, with sciatica presence unspecified   2. Spinal stenosis of lumbar region with neurogenic claudication   3. Spondylolisthesis, lumbar region   4. Other secondary scoliosis, lumbar region     Plan:Avoid bending, stooping and avoid lifting weights greater than 10 lbs. Avoid prolong standing and walking. Avoid frequent bending and stooping  No lifting greater than 10 lbs. May use ice or moist heat for pain. Weight loss is of benefit. Handicap license is approved. Dr. Romona Curls secretary/Assistant will call to arrange for epidural steroid injection  Start gabapentin to decrease nerve pain in your left arm and your legs. Start prednisone to decrease swelling and inflamation in your neck and lumbar joints. May stop with the steriod injection in your spine.  Tylenol in the form of equate up to 6 tablets per day rarely 8 tablets in a 24 hour period.   Follow-Up Instructions: Return in about 6 weeks (around 10/10/2018).   Orders:  Orders Placed This Encounter  Procedures  . XR Lumbar Spine 2-3 Views  . Ambulatory referral to Physical Medicine Rehab   Meds ordered this encounter  Medications  . gabapentin (NEURONTIN) 100 MG capsule    Sig: Take 1 capsule (100 mg total) by mouth at bedtime.    Dispense:  30 capsule    Refill:  3  . predniSONE (DELTASONE) 5 MG tablet    Sig: Take 1 tablet (5 mg total) by mouth daily with breakfast for 14 days, THEN 0.5 tablets (2.5 mg total) daily with breakfast for 14 days.    Dispense:  21 tablet    Refill:  0      Procedures: No procedures performed   Clinical Data: No additional  findings.   Subjective: Chief Complaint  Patient presents with  . Lower Back - Pain    77 year old female with history of lumbar spinal stenosis with previous lumbar laminectomy 02/14/2013 she had a small dural tear and was kept down. She had slow healing and eventually it on to heal. She had eventual healing and improved her pain in her back and legs. Then in 2017 she  Was seen again with increased pain in the back and legs.She had MRI repeated at [facility] and then underwent an ESI 09/27/2016 right L3-4.  She has tried bicycle and had some improvement in exercise pattern. She leans on a cart at [facility] and is able to walk in the store leaning on a cart. She is able to use the exercise bike which works the arms and legs. She is unable to walk more than a half block, she can use a push cart To get around stooping and leaning on the push cart. She has to make choices depending on how far she has to walk. She has terrible swelling every morning as soon as she gets up, improves overnight. She is using compression socks for leg swelling. She wears Nu balance for her feet and has a history of diabetes. Has to get up 3-4 times at  time to go to the bathroom. She is preparing to go to [facility] for sisters funeral, death recently post fall with shoulder fracture. She has not as bad trouble with bowels, mainly constipation due to iron. Using a stool softener She sometime looses control over bowels. She has back pain that is not all the time, better with sitting. If she gets up she has pain in the back in the small of her back that is aching, with over extending it can become sharp, it improves with sitting. She has relief with the ESI for almost 3 months. The pain does go into the left leg, her right one is the weak arm. She has some pain into the left arm. Sometimes sitting the pain worsens and she has to get up to decrease the pain. She has some left neck pain, she goes to the salon to have her hair done. The Pain  in the  Left arm she believes is from her neck. She has had right arm pain in the past and this would improve with wrapping with warm compressess.  The new pain in the left arm is started some months ago. She reports the pain in the left am is greater with extension of the neck with getting her hair done. She had to move the grill this afternoon since daughter left the grill outside last evening.     Review of Systems  Constitutional: Negative.   HENT: Negative.   Eyes: Negative.   Respiratory: Negative.   Cardiovascular: Negative.   Gastrointestinal: Negative.   Endocrine: Negative.   Genitourinary: Negative.   Musculoskeletal: Negative.   Skin: Negative.   Allergic/Immunologic: Negative.   Neurological: Negative.   Hematological: Negative.   Psychiatric/Behavioral: Negative.      Objective: Vital Signs: BP (!) 169/71 (BP Location: Left Arm, Patient Position: Sitting)   Pulse 64   Ht 5' 1.5" (1.562 m)   Wt 250 lb (113.4 kg)   BMI 46.47 kg/m   Physical Exam  Constitutional: She is oriented to person, place, and time. She appears well-developed and well-nourished.  HENT:  Head: Normocephalic and atraumatic.  Eyes: Pupils are equal, round, and reactive to light. EOM are normal.  Neck: Normal range of motion. Neck supple.  Pulmonary/Chest: Effort normal and breath sounds normal.  Abdominal: Soft. Bowel sounds are normal.  Neurological: She is alert and oriented to person, place, and time.  Skin: Skin is warm and dry.  Psychiatric: She has a normal mood and affect. Her behavior is normal. Judgment and thought content normal.    Back Exam   Tenderness  The patient is experiencing tenderness in the lumbar.  Range of Motion  Extension: abnormal  Flexion: abnormal  Lateral bend right: abnormal  Lateral bend left: abnormal  Rotation right: abnormal  Rotation left: abnormal   Muscle Strength  Right Quadriceps:  5/5  Left Quadriceps:  5/5  Right Hamstrings:  5/5    Left Hamstrings:  5/5   Tests  Straight leg raise right: negative Straight leg raise left: negative  Reflexes  Patellar: 0/4 Achilles: 0/4 Babinski's sign: normal   Other  Toe walk: normal Heel walk: normal Sensation: normal Gait: normal  Erythema: no back redness Scars: present      Specialty Comments:  No specialty comments available.  Imaging: Xr Lumbar Spine 2-3 Views  Result Date: 08/29/2018 AP and lateral flexion and extension radiographs of the lumbar spine show diffuse DDD L2-3 through L5-S1, Central laminectomy L2 to S1, anterolisthesis L3-4  less than L4-5 less than L5-S1 the L4-5 and L5-S1 listhesis appears greater with flexion and extension but remains grade 1. There is a mild scoliosis of the lumbar spine apex to the right L1.     PMFS History: Patient Active Problem List   Diagnosis Date Noted  . Hypertension associated with diabetes ([place]) 02/28/2018  . Mild episode of recurrent major depressive disorder ([place]) 08/21/2017  . Morbid obesity ([place]) 01/15/2017  . Status post placement of implantable loop recorder 08/17/2016  . Syncope 02/02/2016  . Bradycardia 02/02/2016  . SOB (shortness of breath) 02/02/2016  . Pre-syncope 02/02/2016  . History of colonic polyps 01/11/2016  . Type 2 diabetes mellitus with circulatory disorder, with long-term current use of insulin ([place]) 01/11/2016  . Chronic back pain - managed by Dr. Marcos Eke at [facility] 03/12/2014  . Gout 06/30/2013  . Hypothyroidism 06/30/2013  . Iron deficiency anemia 06/30/2013   Past Medical History:  Diagnosis Date  . Anemia   . Arthritis   . Cancer ([place]) 1969   , Status post complete hysterectomy per patient  . Cataract    removed both eyes with lens implant both   . Chicken pox   . Chronic bronchitis ([place])   . Colon polyp   . Depression 06/30/2013  . Diabetes mellitus without complication ([place])   . Diverticulitis   . Frequent headaches   . GERD (gastroesophageal reflux disease)   .  Gout 06/30/2013  . Hx of dizziness   . Hyperlipidemia   . Hypertension   . Post-operative nausea and vomiting   . S/P epidural steroid injection 09/27/2016   done in back at [place]   . Spinal stenosis of lumbar region    s/p spine surgery 02/12/13 with Dr Marcos Eke at [facility]  . Thyroid disease   . Urine incontinence   . Use of cane as ambulatory aid     Family History  Problem Relation Age of Onset  . Alcohol abuse Father   . Cirrhosis Father   . Heart attack Brother   . Breast cancer Sister        Reports all family members with breast cancer have had genetic testing that was negative  . Cancer Mother        renal cancer  . Stroke Paternal Grandmother   . Alcohol abuse Brother   . Breast cancer Sister        renal cancer  . Diabetes Brother   . Heart Problems Daughter   . Healthy Daughter   . Healthy Daughter   . Healthy Son   . Healthy Son   . Arthritis Unknown        parents  . Hyperlipidemia Unknown        parent  . Hypertension Unknown        parent/grandparent  . Diabetes Unknown        parent/grandparent  . Heart disease Other   . Mental illness Other   . Colon cancer Neg Hx   . Esophageal cancer Neg Hx   . Prostate cancer Neg Hx   . Rectal cancer Neg Hx   . Seizures Neg Hx   . Colon polyps Neg Hx     Past Surgical History:  Procedure Laterality Date  . ABDOMINAL HYSTERECTOMY  1970   complete hysterectomy for cervical cancer  . BACK SURGERY  01/2013  . BACK SURGERY  1995  . CATARACT EXTRACTION, BILATERAL     with lens implant   . CERVICAL CONE BIOPSY  1970  . COLONOSCOPY    . EP IMPLANTABLE DEVICE N/A 02/15/2016   Procedure: Loop Recorder Insertion;  Surgeon: Sanda Klein, MD;  Location: [place] [facility];  Service: Cardiovascular;  Laterality: N/A;  . POLYPECTOMY    . WRIST SURGERY  2010   for wrist fracture   Social History   Occupational History  . Occupation: Retired  Tobacco Use  . Smoking status: Former Smoker    Last attempt to quit:  12/25/1964    Years since quitting: 53.7  . Smokeless tobacco: Never Used  . Tobacco comment: smoked 6 years / when she was in college   Substance and Sexual Activity  . Alcohol use: Yes    Comment: occ with steak dinner   . Drug use: No  . Sexual activity: Not on file

## 2018-09-03 ENCOUNTER — Ambulatory Visit (INDEPENDENT_AMBULATORY_CARE_PROVIDER_SITE_OTHER): Payer: Medicare Other | Admitting: Physical Medicine and Rehabilitation

## 2018-09-03 ENCOUNTER — Ambulatory Visit (INDEPENDENT_AMBULATORY_CARE_PROVIDER_SITE_OTHER): Payer: Self-pay

## 2018-09-03 ENCOUNTER — Encounter (INDEPENDENT_AMBULATORY_CARE_PROVIDER_SITE_OTHER): Payer: Self-pay | Admitting: Physical Medicine and Rehabilitation

## 2018-09-03 VITALS — BP 195/82 | HR 58 | Temp 97.4°F

## 2018-09-03 DIAGNOSIS — M48062 Spinal stenosis, lumbar region with neurogenic claudication: Secondary | ICD-10-CM

## 2018-09-03 DIAGNOSIS — M5416 Radiculopathy, lumbar region: Secondary | ICD-10-CM | POA: Diagnosis not present

## 2018-09-03 LAB — CUP PACEART REMOTE DEVICE CHECK
Implantable Pulse Generator Implant Date: 20170221
MDC IDC SESS DTM: 20190727133733

## 2018-09-03 MED ORDER — BETAMETHASONE SOD PHOS & ACET 6 (3-3) MG/ML IJ SUSP
12.0000 mg | Freq: Once | INTRAMUSCULAR | Status: AC
  Administered 2018-09-03: 12 mg

## 2018-09-03 NOTE — Patient Instructions (Signed)

## 2018-09-03 NOTE — Progress Notes (Signed)
 .  Numeric Pain Rating Scale and Functional Assessment Average Pain 7   In the last MONTH (on 0-10 scale) has pain interfered with the following?  1. General activity like being  able to carry out your everyday physical activities such as walking, climbing stairs, carrying groceries, or moving a chair?  Rating(3)   +Driver, -BT, -Dye Allergies.  

## 2018-09-04 NOTE — Progress Notes (Signed)
Katie Mcintyre - 77 y.o. female MRN [id number]  Date of birth: 1941/07/15  Office Visit Note: Visit Date: 09/03/2018 PCP: [name], DO Referred by: [name], DO  Subjective: Chief Complaint  Patient presents with  . Lower Back - Pain  . Right Leg - Pain  . Left Leg - Pain   HPI: Katie Mcintyre is a 77 year old female with chronic worsening severe low back pain and bilateral leg pain.  She comes in today at the request of Dr. Basil Dess for diagnostic note for therapeutic bilateral L3 transforaminal epidural steroid injection for severe stenosis at this level.  She has had prior injections which were L3 transforaminal injections at [facility] spine center.   ROS Otherwise per HPI.  Assessment & Plan: Visit Diagnoses:  1. Lumbar radiculopathy   2. Spinal stenosis of lumbar region with neurogenic claudication     Plan: No additional findings.   Meds & Orders:  Meds ordered this encounter  Medications  . betamethasone acetate-betamethasone sodium phosphate (CELESTONE) injection 12 mg    Orders Placed This Encounter  Procedures  . XR C-ARM NO REPORT  . Epidural Steroid injection    Follow-up: Return for Dr. Basil Dess.   Procedures: No procedures performed  Lumbosacral Transforaminal Epidural Steroid Injection - Sub-Pedicular Approach with Fluoroscopic Guidance  [name]      Date of Birth: 29-Jul-1941 MRN: [id number] PCP: [name], DO      Visit Date: 09/03/2018   Universal Protocol:    Date/Time: 09/03/2018  Consent Given By: the patient  Position: PRONE  Additional Comments: Vital signs were monitored before and after the procedure. Patient was prepped and draped in the usual sterile fashion. The correct patient, procedure, and site was verified.   Injection Procedure Details:  Procedure Site One Meds Administered:  Meds ordered this encounter  Medications  . betamethasone acetate-betamethasone sodium phosphate (CELESTONE) injection  12 mg    Laterality: Bilateral  Location/Site:  L3-L4  Needle size: 22 G  Needle type: Spinal  Needle Placement: Transforaminal  Findings:    -Comments: Excellent flow of contrast along the nerve and into the epidural space.  Procedure Details: After squaring off the end-plates to get a true AP view, the C-arm was positioned so that an oblique view of the foramen as noted above was visualized. The target area is just inferior to the "nose of the scotty dog" or sub pedicular. The soft tissues overlying this structure were infiltrated with 2-3 ml. of 1% Lidocaine without Epinephrine.  The spinal needle was inserted toward the target using a "trajectory" view along the fluoroscope beam.  Under AP and lateral visualization, the needle was advanced so it did not puncture dura and was located close the 6 O'Clock position of the pedical in AP tracterory. Biplanar projections were used to confirm position. Aspiration was confirmed to be negative for CSF and/or blood. A 1-2 ml. volume of Isovue-250 was injected and flow of contrast was noted at each level. Radiographs were obtained for documentation purposes.   After attaining the desired flow of contrast documented above, a 0.5 to 1.0 ml test dose of 0.25% Marcaine was injected into each respective transforaminal space.  The patient was observed for 90 seconds post injection.  After no sensory deficits were reported, and normal lower extremity motor function was noted,   the above injectate was administered so that equal amounts of the injectate were placed at each foramen (level) into the transforaminal epidural  space.   Additional Comments:  The patient tolerated the procedure well Dressing: Band-Aid    Post-procedure details: Patient was observed during the procedure. Post-procedure instructions were reviewed.  Patient left the clinic in stable condition.    Clinical History: FINDINGS:  There is lumbosacral transitional anatomy.  For the purpose of this dictation, the last well-formed disc space is designated as S1-S2 and labeled on image as such.  Minimal anterolisthesis of L4 on L5. Normal lumbar spinal alignment otherwise. Status post L3-L5 laminectomy. Marrow is unremarkable. Conus terminates at approximately L1. The visualized spinal cord morphology and signal, and the cauda equina appear normal. Negative for epidural hematoma. Small left renal cysts. Retroperitoneal soft tissues are unremarkable. Sacroiliac joints are normal.  --T11-T12: Imaged on the sagittal plane only. Large posterior disc osteophyte complex impression on ventral cord. No cord signal abnormality. Moderate bilateral neuroforaminal narrowing. --T12-L1: Mild posterior disc bulge without canal or foraminal narrowing. Mild bilateral facet degenerative disease.  --L1-L2: There is no evidence of spinal stenosis, disc bulge or neural foraminal narrowing. Mild left facet degenerative disease. --L2-L3: Large posterior disc osteophyte complex and severe bilateral facet hypertrophy, resulting in moderate spinal canal narrowing and severe bilateral neuroforamina narrowing.  --L3-L4: Broad-based disc bulge and severe bilateral facet hypertrophy, resulting in the severe spinal canal and bilateral neural foraminal narrowing. --L4-L5: Status post laminectomy. Broad based disc bulge and severe bilateral facet hypertrophy resulting in mild spinal canal narrowing and severe left and mild right neuroforaminal narrowing.  --L5-S1: Status post laminectomy. No disc bulge or spinal canal narrowing. Severe bilateral neuroforamina narrowing. Severe bilateral facet hypertrophy   IMPRESSION: 1. Spinal canal narrowing is moderate at L2-L3, severe at L3-L4, and mild at L4-L5, mostly from degenerative facet disease. 2. Multilevel neuroforaminal narrowing, which is severe at bilateral L2-L3, bilateral L3-L4,, left L4-L5, and bilateral L5-S1.   Electronically  Reviewed GG:YIRSW Roosevelt Locks, MD Electronically Reviewed on:09/09/2016 7:55 PM   She reports that she quit smoking about 53 years ago. She has never used smokeless tobacco.  Recent Labs    11/06/17 0936 02/07/18 1415 07/08/18 1127  HGBA1C 8.0* 8.8* 7.9*    Objective:  VS:  HT:    WT:   BMI:     BP:(!) 195/82  HR:(!) 58bpm  TEMP:(!) 97.4 F (36.3 C)(Oral)  RESP:  Physical Exam  Ortho Exam Imaging: Xr C-arm No Report  Result Date: 09/03/2018 Please see Notes tab for imaging impression.   Past Medical/Family/Surgical/Social History: Medications & Allergies reviewed per EMR, new medications updated. Patient Active Problem List   Diagnosis Date Noted  . Hypertension associated with diabetes ([place]) 02/28/2018  . Mild episode of recurrent major depressive disorder ([place]) 08/21/2017  . Morbid obesity ([place]) 01/15/2017  . Status post placement of implantable loop recorder 08/17/2016  . Syncope 02/02/2016  . Bradycardia 02/02/2016  . SOB (shortness of breath) 02/02/2016  . Pre-syncope 02/02/2016  . History of colonic polyps 01/11/2016  . Type 2 diabetes mellitus with circulatory disorder, with long-term current use of insulin ([place]) 01/11/2016  . Chronic back pain - managed by Dr. Marcos Eke at [facility] 03/12/2014  . Gout 06/30/2013  . Hypothyroidism 06/30/2013  . Iron deficiency anemia 06/30/2013   Past Medical History:  Diagnosis Date  . Anemia   . Arthritis   . Cancer ([place]) 1969   , Status post complete hysterectomy per patient  . Cataract    removed both eyes with lens implant both   . Chicken pox   . Chronic bronchitis ([place])   .  Colon polyp   . Depression 06/30/2013  . Diabetes mellitus without complication ([place])   . Diverticulitis   . Frequent headaches   . GERD (gastroesophageal reflux disease)   . Gout 06/30/2013  . Hx of dizziness   . Hyperlipidemia   . Hypertension   . Post-operative nausea and vomiting   . S/P epidural steroid injection 09/27/2016   done in back  at [facility]   . Spinal stenosis of lumbar region    s/p spine surgery 02/12/13 with Dr Marcos Eke at [facility]  . Thyroid disease   . Urine incontinence   . Use of cane as ambulatory aid    Family History  Problem Relation Age of Onset  . Alcohol abuse Father   . Cirrhosis Father   . Heart attack Brother   . Breast cancer Sister        Reports all family members with breast cancer have had genetic testing that was negative  . Cancer Mother        renal cancer  . Stroke Paternal Grandmother   . Alcohol abuse Brother   . Breast cancer Sister        renal cancer  . Diabetes Brother   . Heart Problems Daughter   . Healthy Daughter   . Healthy Daughter   . Healthy Son   . Healthy Son   . Arthritis Unknown        parents  . Hyperlipidemia Unknown        parent  . Hypertension Unknown        parent/grandparent  . Diabetes Unknown        parent/grandparent  . Heart disease Other   . Mental illness Other   . Colon cancer Neg Hx   . Esophageal cancer Neg Hx   . Prostate cancer Neg Hx   . Rectal cancer Neg Hx   . Seizures Neg Hx   . Colon polyps Neg Hx    Past Surgical History:  Procedure Laterality Date  . ABDOMINAL HYSTERECTOMY  1970   complete hysterectomy for cervical cancer  . BACK SURGERY  01/2013  . BACK SURGERY  1995  . CATARACT EXTRACTION, BILATERAL     with lens implant   . CERVICAL CONE BIOPSY  1970  . COLONOSCOPY    . EP IMPLANTABLE DEVICE N/A 02/15/2016   Procedure: Loop Recorder Insertion;  Surgeon: Sanda Klein, MD;  Location: [place] [facility];  Service: Cardiovascular;  Laterality: N/A;  . POLYPECTOMY    . WRIST SURGERY  2010   for wrist fracture   Social History   Occupational History  . Occupation: Retired  Tobacco Use  . Smoking status: Former Smoker    Last attempt to quit: 12/25/1964    Years since quitting: 53.7  . Smokeless tobacco: Never Used  . Tobacco comment: smoked 6 years / when she was in college   Substance and Sexual Activity  . Alcohol  use: Yes    Comment: occ with steak dinner   . Drug use: No  . Sexual activity: Not on file

## 2018-09-04 NOTE — Procedures (Signed)
Lumbosacral Transforaminal Epidural Steroid Injection - Sub-Pedicular Approach with Fluoroscopic Guidance  [name]      Date of Birth: [birth year] MRN: [id number] PCP: [name], DO      Visit Date: 09/03/2018   Universal Protocol:    Date/Time: 09/03/2018  Consent Given By: the patient  Position: PRONE  Additional Comments: Vital signs were monitored before and after the procedure. Patient was prepped and draped in the usual sterile fashion. The correct patient, procedure, and site was verified.   Injection Procedure Details:  Procedure Site One Meds Administered:  Meds ordered this encounter  Medications  . betamethasone acetate-betamethasone sodium phosphate (CELESTONE) injection 12 mg    Laterality: Bilateral  Location/Site:  L3-L4  Needle size: 22 G  Needle type: Spinal  Needle Placement: Transforaminal  Findings:    -Comments: Excellent flow of contrast along the nerve and into the epidural space.  Procedure Details: After squaring off the end-plates to get a true AP view, the C-arm was positioned so that an oblique view of the foramen as noted above was visualized. The target area is just inferior to the "nose of the scotty dog" or sub pedicular. The soft tissues overlying this structure were infiltrated with 2-3 ml. of 1% Lidocaine without Epinephrine.  The spinal needle was inserted toward the target using a "trajectory" view along the fluoroscope beam.  Under AP and lateral visualization, the needle was advanced so it did not puncture dura and was located close the 6 O'Clock position of the pedical in AP tracterory. Biplanar projections were used to confirm position. Aspiration was confirmed to be negative for CSF and/or blood. A 1-2 ml. volume of Isovue-250 was injected and flow of contrast was noted at each level. Radiographs were obtained for documentation purposes.   After attaining the desired flow of contrast documented above, a 0.5 to  1.0 ml test dose of 0.25% Marcaine was injected into each respective transforaminal space.  The patient was observed for 90 seconds post injection.  After no sensory deficits were reported, and normal lower extremity motor function was noted,   the above injectate was administered so that equal amounts of the injectate were placed at each foramen (level) into the transforaminal epidural space.   Additional Comments:  The patient tolerated the procedure well Dressing: Band-Aid    Post-procedure details: Patient was observed during the procedure. Post-procedure instructions were reviewed.  Patient left the clinic in stable condition.

## 2018-09-12 ENCOUNTER — Encounter (HOSPITAL_BASED_OUTPATIENT_CLINIC_OR_DEPARTMENT_OTHER): Payer: Medicare Other

## 2018-09-12 ENCOUNTER — Other Ambulatory Visit: Payer: Self-pay | Admitting: Cardiovascular Disease

## 2018-09-12 ENCOUNTER — Telehealth: Payer: Self-pay | Admitting: *Deleted

## 2018-09-12 NOTE — Telephone Encounter (Signed)
Manual transmission received and reviewed. Some episodes false, some episodes are indeterminate, durations 2-47min. ECGs printed and placed in Dr. Langley Gauss folder for review. See example ECGs below.

## 2018-09-12 NOTE — Telephone Encounter (Signed)
Spoke with patient to request manual [facility] transmission for review of 6 "AF" episodes. Available ECG appears SR with ectopy, not true AF. Patient agrees to send transmission.

## 2018-09-13 NOTE — Telephone Encounter (Signed)
Some of those indeed look worrisome for AFib, but all are very brief. Will hold off anticoagulation. Will also review with Dr. Debara Pickett.  ILR implanted for syncope, not for stroke.

## 2018-09-16 ENCOUNTER — Other Ambulatory Visit: Payer: Self-pay | Admitting: Cardiovascular Disease

## 2018-09-18 LAB — CUP PACEART REMOTE DEVICE CHECK
Implantable Pulse Generator Implant Date: 20170221
MDC IDC SESS DTM: 20190829154102

## 2018-09-18 NOTE — Telephone Encounter (Signed)
She is having a sleep study as well next week - would like to see what happens then. Some episodes do look like brief atrial fib - would like more data. Will review risk/benefit of anticoagulation with her. Do not suspect this is related to syncope.  [place]

## 2018-09-20 ENCOUNTER — Encounter: Payer: Self-pay | Admitting: Podiatry

## 2018-09-20 ENCOUNTER — Other Ambulatory Visit (INDEPENDENT_AMBULATORY_CARE_PROVIDER_SITE_OTHER): Payer: Self-pay | Admitting: Specialist

## 2018-09-20 ENCOUNTER — Ambulatory Visit: Payer: Medicare Other | Admitting: Podiatry

## 2018-09-20 DIAGNOSIS — B351 Tinea unguium: Secondary | ICD-10-CM

## 2018-09-20 DIAGNOSIS — M79675 Pain in left toe(s): Secondary | ICD-10-CM

## 2018-09-20 DIAGNOSIS — M79674 Pain in right toe(s): Secondary | ICD-10-CM | POA: Diagnosis not present

## 2018-09-20 DIAGNOSIS — E1142 Type 2 diabetes mellitus with diabetic polyneuropathy: Secondary | ICD-10-CM | POA: Diagnosis not present

## 2018-09-20 NOTE — Telephone Encounter (Signed)
Prednisone refill request

## 2018-09-21 ENCOUNTER — Other Ambulatory Visit (INDEPENDENT_AMBULATORY_CARE_PROVIDER_SITE_OTHER): Payer: Self-pay | Admitting: Specialist

## 2018-09-23 NOTE — Telephone Encounter (Signed)
Gabapentin request for 90 day supply

## 2018-09-24 ENCOUNTER — Ambulatory Visit (HOSPITAL_BASED_OUTPATIENT_CLINIC_OR_DEPARTMENT_OTHER): Payer: Medicare Other | Attending: Adult Health | Admitting: Cardiovascular Disease

## 2018-09-24 ENCOUNTER — Ambulatory Visit (INDEPENDENT_AMBULATORY_CARE_PROVIDER_SITE_OTHER): Payer: Medicare Other | Admitting: *Deleted

## 2018-09-24 VITALS — Ht 61.5 in | Wt 250.0 lb

## 2018-09-24 DIAGNOSIS — R0683 Snoring: Secondary | ICD-10-CM | POA: Diagnosis not present

## 2018-09-24 DIAGNOSIS — I493 Ventricular premature depolarization: Secondary | ICD-10-CM | POA: Diagnosis not present

## 2018-09-24 DIAGNOSIS — G4736 Sleep related hypoventilation in conditions classified elsewhere: Secondary | ICD-10-CM | POA: Diagnosis not present

## 2018-09-24 DIAGNOSIS — G4719 Other hypersomnia: Secondary | ICD-10-CM

## 2018-09-24 DIAGNOSIS — I10 Essential (primary) hypertension: Secondary | ICD-10-CM | POA: Insufficient documentation

## 2018-09-24 DIAGNOSIS — R0902 Hypoxemia: Secondary | ICD-10-CM | POA: Diagnosis not present

## 2018-09-24 DIAGNOSIS — G4710 Hypersomnia, unspecified: Secondary | ICD-10-CM | POA: Diagnosis not present

## 2018-09-24 DIAGNOSIS — R001 Bradycardia, unspecified: Secondary | ICD-10-CM

## 2018-09-24 DIAGNOSIS — E1159 Type 2 diabetes mellitus with other circulatory complications: Secondary | ICD-10-CM | POA: Diagnosis not present

## 2018-09-24 DIAGNOSIS — R55 Syncope and collapse: Secondary | ICD-10-CM | POA: Diagnosis not present

## 2018-09-24 DIAGNOSIS — Z794 Long term (current) use of insulin: Secondary | ICD-10-CM | POA: Diagnosis not present

## 2018-09-24 DIAGNOSIS — R0602 Shortness of breath: Secondary | ICD-10-CM | POA: Insufficient documentation

## 2018-09-24 DIAGNOSIS — Z79899 Other long term (current) drug therapy: Secondary | ICD-10-CM | POA: Diagnosis not present

## 2018-09-24 DIAGNOSIS — G4730 Sleep apnea, unspecified: Secondary | ICD-10-CM | POA: Diagnosis not present

## 2018-09-24 DIAGNOSIS — I152 Hypertension secondary to endocrine disorders: Secondary | ICD-10-CM

## 2018-09-24 NOTE — Progress Notes (Signed)
Carelink Summary Report / Loop Recorder 

## 2018-09-24 NOTE — Progress Notes (Signed)
Subjective: Katie Mcintyre presents today for follow up diabetic foot care. She has h/o diabetic neuropathy and painful, discolored, thick toenails which interfere with daily activities and routine tasks. Pain is aggravated when wearing enclosed shoe gear and is getting progressively worse and relieved with periodic professional debridement.  Katie Mcintyre voices no new pedal problems on today's visit.  Objective: Vascular Examination: Capillary refill time immediate x 10 digits Dorsalis pedis and Posterior tibial pulses present b/l No digital hair x 10 digits Skin temperature gradient WNL b/l  Dermatological Examination: Atrophic skin with absent hair growth b/l Toenails 1-5 b/l discolored, thick, dystrophic with subungual debris and pain with palpation to nailbeds due to thickness of nails.  Musculoskeletal: Ambulates with cane Muscle strength 5/5 to all LE muscle groups Hammertoe 2nd b/l  Neurological: Sensation diminished with 10 gram monofilament. Vibratory sensation diminished  Assessment: 1. Painful onychomycosis toenails 1-5 b/l 2. NIDDM with Diabetic neuropathy  Plan: 1. Continue diabetic foot care principles.  2. Toenails 1-5 b/l were debrided in length and girth without iatrogenic bleeding. 3. Patient to continue soft, supportive shoe gear 4. Patient to report any pedal injuries to medical professional  5. Follow up 3 months. Patient/POA to call should there be a concern in the inter

## 2018-09-25 LAB — CUP PACEART REMOTE DEVICE CHECK
Date Time Interrogation Session: 20191001154127
MDC IDC PG IMPLANT DT: 20170221

## 2018-10-13 ENCOUNTER — Encounter (HOSPITAL_BASED_OUTPATIENT_CLINIC_OR_DEPARTMENT_OTHER): Payer: Self-pay | Admitting: Cardiovascular Disease

## 2018-10-13 ENCOUNTER — Other Ambulatory Visit: Payer: Self-pay | Admitting: Family Medicine

## 2018-10-13 NOTE — Procedures (Signed)
Patient Name: Katie Mcintyre, Katie Mcintyre Date: 09/24/2018 Gender: Female D.O.B: 02-12-1941 Age (years): 10 Referring Provider: Jory Sims NP Height (inches): 62 Interpreting Physician: Shelva Majestic MD, ABSM Weight (lbs): 250 RPSGT: Zadie Rhine BMI: 46 MRN: [id number] Neck Size: 18.00  CLINICAL INFORMATION Sleep Study Type: NPSG  Indication for sleep study: Hypertension  Epworth Sleepiness Score: 20  SLEEP STUDY TECHNIQUE As per the AASM Manual for the Scoring of Sleep and Associated Events v2.3 (April 2016) with a hypopnea requiring 4% desaturations.  The channels recorded and monitored were frontal, central and occipital EEG, electrooculogram (EOG), submentalis EMG (chin), nasal and oral airflow, thoracic and abdominal wall motion, anterior tibialis EMG, snore microphone, electrocardiogram, and pulse oximetry.  MEDICATIONS     acetaminophen (TYLENOL) 500 MG tablet         amLODipine (NORVASC) 5 MG tablet         B-D UF III MINI PEN NEEDLES 31G X 5 MM MISC         Blood Glucose Calibration (ACCU-CHEK AVIVA) SOLN         calcium-vitamin D (OSCAL 500/200 D-3) 500-200 MG-UNIT per tablet         cloNIDine (CATAPRES) 0.1 MG tablet         ferrous sulfate 325 (65 FE) MG tablet         gabapentin (NEURONTIN) 100 MG capsule         hydrALAZINE (APRESOLINE) 50 MG tablet         Insulin Lispro Prot & Lispro (HUMALOG MIX 75/25 KWIKPEN) (75-25) 100 UNIT/ML Kwikpen         irbesartan (AVAPRO) 150 MG tablet         isosorbide dinitrate (ISORDIL) 20 MG tablet         isosorbide dinitrate (ISORDIL) 20 MG tablet         Lancet Devices (ACCU-CHEK SOFTCLIX) lancets         levothyroxine (SYNTHROID, LEVOTHROID) 125 MCG tablet         metFORMIN (GLUCOPHAGE) 1000 MG tablet         Sennosides-Docusate Sodium (STOOL SOFTENER & LAXATIVE PO)         simvastatin (ZOCOR) 20 MG tablet         tiZANidine (ZANAFLEX) 2 MG tablet         vitamin B-12 (CYANOCOBALAMIN) 1000 MCG tablet       Medications self-administered by patient taken the night of the study : N/A  SLEEP ARCHITECTURE The study was initiated at 9:32:02 PM and ended at 4:09:20 AM.  Sleep onset time was 3.7 minutes and the sleep efficiency was 78.9%%. The total sleep time was 313.5 minutes.   Wake after sleep onset (WASO) was 80 minutes.   Stage REM latency was 2.0 minutes.  The patient spent 7.0%% of the night in stage N1 sleep, 69.7%% in stage N2 sleep, 0.0%% in stage N3 and 23.3% in REM.  Alpha intrusion was absent.  Supine sleep was 100.00%.  RESPIRATORY PARAMETERS The overall apnea/hypopnea index (AHI) was 3.3 per hour. The respiratory disturbance index (RDI) was 3.4 per hour. There were 11 total apneas, including 11 obstructive, 0 central and 0 mixed apneas. There were 6 hypopneas and 1 RERAs.  The AHI during Stage REM sleep was 12.3 per hour.  AHI while supine was 3.3 per hour.  The mean oxygen saturation was 95.3%. The minimum SpO2 during sleep was 89.0%.  Soft snoring was noted during this study.  CARDIAC DATA  The 2 lead EKG demonstrated sinus rhythm. The mean heart rate was 82.7 beats per minute. Other EKG findings include: PVCs.  LEG MOVEMENT DATA The total PLMS were 0 with a resulting PLMS index of 0.0. Associated arousal with leg movement index was 0.8 .  IMPRESSIONS - Increased upper airway resistance (UARS) without definitive slee apnea overall (AHI  3.3/h); however, mild sleep apnea during REM sleep (AHI 12.3/h) - No significant central sleep apnea occurred during this study (CAI = 0.0/h). - The patient had minimal oxygen desaturation to a nadir of 89.0%. - The patient snored with soft snoring volume. - EKG findings include PVCs. - Clinically significant periodic limb movements did not occur during sleep. No significant associated arousals.  DIAGNOSIS - Sleep Apnea, unspecified type G47.30 - Nocturnal Hypoxemia (327.26 [G47.36 ICD-10]) - Excessive Daytie  Sleepiness  RECOMMENDATIONS - At present patient does not meet ctriteria for CPAP therapy. - Efforts should be made to optimize nasal and oropharyngeal patency. - Consider alternatives for the treatment of snoring. - With reduced REM latency at only 2 minutes and daytime sleepiness persists (ESS 20), consider a multiple latency sleep test (MLST) to asses for narcolepsy or idiopathic hypersomnia.  - Avoid alcohol, sedatives and other CNS depressants that may worsen sleep apnea and disrupt normal sleep architecture. - Sleep hygiene should be reviewed to assess factors that may improve sleep quality. - Weight management (BMI 46) and regular exercise should be initiated or continued if appropriate.  [Electronically signed] 10/13/2018 11:20 AM  Shelva Majestic MD, [facility], [place], American Board of Sleep Medicine   NPI: [id number] [place] PH: [phone]   FX: [phone] [place]

## 2018-10-14 ENCOUNTER — Ambulatory Visit (INDEPENDENT_AMBULATORY_CARE_PROVIDER_SITE_OTHER): Payer: Medicare Other | Admitting: Specialist

## 2018-10-14 ENCOUNTER — Telehealth: Payer: Self-pay | Admitting: *Deleted

## 2018-10-14 ENCOUNTER — Encounter (INDEPENDENT_AMBULATORY_CARE_PROVIDER_SITE_OTHER): Payer: Self-pay | Admitting: Specialist

## 2018-10-14 ENCOUNTER — Other Ambulatory Visit: Payer: Self-pay | Admitting: Cardiovascular Disease

## 2018-10-14 VITALS — BP 190/83 | HR 90 | Ht 61.5 in | Wt 250.0 lb

## 2018-10-14 DIAGNOSIS — R400 Somnolence: Secondary | ICD-10-CM

## 2018-10-14 DIAGNOSIS — IMO0002 Reserved for concepts with insufficient information to code with codable children: Secondary | ICD-10-CM

## 2018-10-14 DIAGNOSIS — M48062 Spinal stenosis, lumbar region with neurogenic claudication: Secondary | ICD-10-CM | POA: Diagnosis not present

## 2018-10-14 DIAGNOSIS — M7541 Impingement syndrome of right shoulder: Secondary | ICD-10-CM

## 2018-10-14 DIAGNOSIS — G4730 Sleep apnea, unspecified: Secondary | ICD-10-CM

## 2018-10-14 DIAGNOSIS — G4736 Sleep related hypoventilation in conditions classified elsewhere: Secondary | ICD-10-CM

## 2018-10-14 NOTE — Telephone Encounter (Signed)
-----   Message from Troy Sine, MD sent at 10/13/2018 11:28 AM EDT ----- Mariann Laster, please notify pt of the results.. With ESS 20, REM latency at 2 minutes recommend a PSG/MLST for evaluation of narcolepsy or idiopathic hypersomnia.

## 2018-10-14 NOTE — Patient Instructions (Signed)
Avoid overhead lifting and overhead use of the arms. Do not lift greater than 10 lbs. Tylenol ES one every 6-8 hours for pain and inflamation. CBD oil or tylenol for antiinflamatory affect. Home exercise program. Avoid bending, stooping and avoid lifting weights greater than 10 lbs. Avoid prolong standing and walking. Avoid frequent bending and stooping  No lifting greater than 10 lbs. May use ice or moist heat for pain. Weight loss is of benefit. Handicap license is approved. If the back and leg pain recurrs then call to arrange for epidural steroid injection, leave a message with Alyse Low, my assistant.

## 2018-10-14 NOTE — Progress Notes (Signed)
Office Visit Note   [name]           Date of Birth: 12-Jun-1941           MRN: [id number] Visit Date: 10/14/2018              Requested by: [name], DO [address] [place], [place] [address] PCP: [name], DO   Assessment & Plan: Visit Diagnoses:  1. Impingement syndrome of right shoulder   2. Spinal stenosis of lumbar region with neurogenic claudication     Plan: Avoid overhead lifting and overhead use of the arms. Do not lift greater than 10 lbs. Tylenol ES one every 6-8 hours for pain and inflamation. CBD oil or tylenol for antiinflamatory affect. Home exercise program. Avoid bending, stooping and avoid lifting weights greater than 10 lbs. Avoid prolong standing and walking. Avoid frequent bending and stooping  No lifting greater than 10 lbs. May use ice or moist heat for pain. Weight loss is of benefit. Handicap license is approved. If the back and leg pain recurrs then call to arrange for epidural steroid injection, leave a message with Alyse Low, my assistant.   Follow-Up Instructions: No follow-ups on file.   Orders:  No orders of the defined types were placed in this encounter.  No orders of the defined types were placed in this encounter.     Procedures: No procedures performed   Clinical Data: No additional findings.   Subjective: Chief Complaint  Patient presents with  . Lower Back - Follow-up    She had Bilateral L3-4 TF with Dr. Ernestina Patches on 09/03/18, she states that she did get a lot of relief from the injection.  She states that she has been getting some discomfort in her right leg again for about 1 week now.    Avoid overhead lifting and overhead use of the arms. Do not lift greater than 10 lbs. Tylenol ES one every 6-8 hours for pain and inflamation.  Home exercise program. The lower back condition is due to spinal stenosis.Avoid bending, stooping and avoid lifting weights greater than 10 lbs. Avoid prolong  standing and walking. Avoid frequent bending and stooping  No lifting greater than 10 lbs. May use ice or moist heat for pain. Weight loss is of benefit. Handicap license is approved. Call if the pain is recurring and we will order you further epidural steroids.   Review of Systems   Objective: Vital Signs: Ht 5' 1.5" (1.562 m)   Wt 250 lb (113.4 kg)   BMI 46.47 kg/m   Physical Exam  Back Exam   Tenderness  The patient is experiencing tenderness in the lumbar.  Range of Motion  Extension: normal  Flexion: abnormal  Lateral bend right: abnormal  Rotation right: abnormal  Rotation left: abnormal   Muscle Strength  Right Quadriceps:  5/5  Left Quadriceps:  5/5  Right Hamstrings:  5/5  Left Hamstrings:  5/5   Tests  Straight leg raise right: negative Straight leg raise left: negative  Reflexes  Patellar: normal Achilles: normal Babinski's sign: normal   Other  Toe walk: normal Heel walk: normal Sensation: normal Erythema: no back redness   Right Shoulder Exam   Tenderness  The patient is experiencing tenderness in the acromion.  Range of Motion  Active abduction: abnormal  Passive abduction: abnormal  Extension: abnormal  External rotation: abnormal  Forward flexion: abnormal  Internal rotation 0 degrees: normal  Internal rotation 90 degrees: normal  Muscle Strength  Abduction: 4/5  Internal rotation: 4/5  External rotation: 4/5  Supraspinatus: 4/5  Subscapularis: 4/5   Tests  Impingement: positive Drop arm: positive  Other  Erythema: absent Sensation: normal Pulse: present      Specialty Comments:  No specialty comments available.  Imaging: No results found.   PMFS History: Patient Active Problem List   Diagnosis Date Noted  . Hypertension associated with diabetes ([place]) 02/28/2018  . Mild episode of recurrent major depressive disorder ([place]) 08/21/2017  . Morbid obesity ([place]) 01/15/2017  . Status post placement of  implantable loop recorder 08/17/2016  . Syncope 02/02/2016  . Bradycardia 02/02/2016  . SOB (shortness of breath) 02/02/2016  . Pre-syncope 02/02/2016  . History of colonic polyps 01/11/2016  . Type 2 diabetes mellitus with circulatory disorder, with long-term current use of insulin ([place]) 01/11/2016  . Chronic back pain - managed by Dr. Marcos [facility]ke at [facility] 03/12/2014  . Gout 06/30/2013  . Hypothyroidism 06/30/2013  . Iron deficiency anemia 06/30/2013   Past Medical History:  Diagnosis Date  . Anemia   . Arthritis   . Cancer ([place]) 1969   , Status post complete hysterectomy per patient  . Cataract    removed both eyes with lens implant both   . Chicken pox   . Chronic bronchitis ([place])   . Colon polyp   . Depression 06/30/2013  . Diabetes mellitus without complication ([place])   . Diverticulitis   . Frequent headaches   . GERD (gastroesophageal reflux disease)   . Gout 06/30/2013  . Hx of dizziness   . Hyperlipidemia   . Hypertension   . Post-operative nausea and vomiting   . S/P epidural steroid injection 09/27/2016   done in back at [place]   . Spinal stenosis of lumbar region    s/p spine surgery 02/12/13 with Dr Marcos Eke at [facility]  . Thyroid disease   . Urine incontinence   . Use of cane as ambulatory aid     Family History  Problem Relation Age of Onset  . Alcohol abuse Father   . Cirrhosis Father   . Heart attack Brother   . Breast cancer Sister        Reports all family members with breast cancer have had genetic testing that was negative  . Cancer Mother        renal cancer  . Stroke Paternal Grandmother   . Alcohol abuse Brother   . Breast cancer Sister        renal cancer  . Diabetes Brother   . Heart Problems Daughter   . Healthy Daughter   . Healthy Daughter   . Healthy Son   . Healthy Son   . Arthritis Unknown        parents  . Hyperlipidemia Unknown        parent  . Hypertension Unknown        parent/grandparent  . Diabetes Unknown         parent/grandparent  . Heart disease Other   . Mental illness Other   . Colon cancer Neg Hx   . Esophageal cancer Neg Hx   . Prostate cancer Neg Hx   . Rectal cancer Neg Hx   . Seizures Neg Hx   . Colon polyps Neg Hx     Past Surgical History:  Procedure Laterality Date  . ABDOMINAL HYSTERECTOMY  1970   complete hysterectomy for cervical cancer  . BACK SURGERY  01/2013  . BACK SURGERY  1995  .  CATARACT EXTRACTION, BILATERAL     with lens implant   . CERVICAL CONE BIOPSY  1970  . COLONOSCOPY    . EP IMPLANTABLE DEVICE N/A 02/15/2016   Procedure: Loop Recorder Insertion;  Surgeon: Sanda Klein, MD;  Location: [facility];  Service: Cardiovascular;  Laterality: N/A;  . POLYPECTOMY    . WRIST SURGERY  2010   for wrist fracture   Social History   Occupational History  . Occupation: Retired  Tobacco Use  . Smoking status: Former Smoker    Last attempt to quit: 12/25/1964    Years since quitting: 53.8  . Smokeless tobacco: Never Used  . Tobacco comment: smoked 6 years / when she was in college   Substance and Sexual Activity  . Alcohol use: Yes    Comment: occ with steak dinner   . Drug use: No  . Sexual activity: Not on file

## 2018-10-14 NOTE — Telephone Encounter (Signed)
Patient notified of sleep study results and recommendations. She agrees to further testing.

## 2018-10-15 ENCOUNTER — Other Ambulatory Visit: Payer: Self-pay | Admitting: Adult Health

## 2018-10-15 ENCOUNTER — Telehealth: Payer: Self-pay | Admitting: *Deleted

## 2018-10-15 NOTE — Telephone Encounter (Signed)
-----   Message from Lauralee Evener, [place] sent at 10/14/2018 10:32 AM EDT ----- MLST-NSPG

## 2018-10-15 NOTE — Telephone Encounter (Signed)
Patient notified of MLST-NSPG appointment scheduled on 11/26/18.

## 2018-10-17 ENCOUNTER — Other Ambulatory Visit: Payer: Self-pay | Admitting: Family Medicine

## 2018-10-21 ENCOUNTER — Other Ambulatory Visit: Payer: Self-pay | Admitting: Adult Health

## 2018-10-28 ENCOUNTER — Ambulatory Visit (INDEPENDENT_AMBULATORY_CARE_PROVIDER_SITE_OTHER): Payer: Medicare Other | Admitting: *Deleted

## 2018-10-28 DIAGNOSIS — R001 Bradycardia, unspecified: Secondary | ICD-10-CM

## 2018-10-28 DIAGNOSIS — R55 Syncope and collapse: Secondary | ICD-10-CM

## 2018-10-28 NOTE — Progress Notes (Signed)
Carelink Summary Report / Loop Recorder 

## 2018-11-05 ENCOUNTER — Ambulatory Visit: Payer: Medicare Other | Admitting: Internal Medicine

## 2018-11-05 ENCOUNTER — Encounter: Payer: Self-pay | Admitting: Internal Medicine

## 2018-11-05 VITALS — BP 192/74 | HR 65 | Ht 61.5 in | Wt 255.0 lb

## 2018-11-05 DIAGNOSIS — I10 Essential (primary) hypertension: Secondary | ICD-10-CM

## 2018-11-05 DIAGNOSIS — R400 Somnolence: Secondary | ICD-10-CM | POA: Diagnosis not present

## 2018-11-05 DIAGNOSIS — I471 Supraventricular tachycardia: Secondary | ICD-10-CM

## 2018-11-05 MED ORDER — ATORVASTATIN CALCIUM 40 MG PO TABS: 40.0000 mg | ORAL_TABLET | Freq: Every day | ORAL | 3 refills | Status: DC

## 2018-11-05 MED ORDER — DILTIAZEM HCL ER COATED BEADS 120 MG PO TB24: 120.0000 mg | ORAL_TABLET | Freq: Every day | ORAL | 3 refills | Status: DC

## 2018-11-05 NOTE — Patient Instructions (Signed)
Medication Instructions:  START diltiazem 120mg  daily STOP simvastatin  START atorvastatin 40mg  daily ** cannot take simvastatin 40mg  with diltiazem - reason for med change  If you need a refill on your cardiac medications before your next appointment, please call your pharmacy.   Follow-Up: At [facility], you and your health needs are our priority.  As part of our continuing mission to provide you with exceptional heart care, we have created designated Provider Care Teams.  These Care Teams include your primary Cardiologist (physician) and Advanced Practice Providers (APPs -  Physician Assistants and Nurse Practitioners) who all work together to provide you with the care you need, when you need it. . You will need a follow up appointment in 3 months with Dr. Debara Pickett

## 2018-11-05 NOTE — Progress Notes (Signed)
OFFICE NOTE  Chief Complaint:  Routine follow-up  Primary Care Physician: [name], DO  HPI:  Katie Mcintyre is a pleasant 77 year old female with a number of cardiovascular risk factors including insulin-dependent diabetes, dyslipidemia, hypertension and morbid obesity. She recently described a couple of episodes when she was driving of presyncope/syncope. She was able to maintain control of the car but basically started to lose consciousness and was in a very afraid. She does not believe this is related to blood sugars although she said that she personally increased her Lantus around that time thinking that her blood sugars my been too high. Her primary care provider then decrease that. She denies any symptoms of heart racing or chest pain. She has reported some shortness of breath which is been progressively worse. An EKG in her primary care provider's office shows sinus bradycardia, left atrial enlargement and voltage criteria for LVH with nonspecific T-wave changes. Heart rate is 50.   Katie Mcintyre returns today for follow-up of her loop recorder. The device interrogation indicated 1 possible high rate episode for which she said she had some presyncopal symptoms while driving. There were 2 bradycardic episodes which were not captured due to under sensing. Neither of these episodes however were associated with her syncopal episode. Please refer to the device interrogation note, which is copied below for description of a recent event that she had which was witnessed during her device interrogation in our office:  "During appointment, noted that patient's eyes began to droop bilaterally and she stopped verbally responding mid-conversation. Patient had been sitting up in chair but was slumped with her chin resting on her chest by this time. Patient did not appear to lose consciousness, but her eyes remained half-open. Attempted orientation questions with patient, but she did not verbally  respond. Called patient's name and patient was able to fully open her eyes on command, but she had difficulty making eye contact initially. Elevated leg rest on chair and obtained vital signs--BP 182/81, HR 74, O2 sat 98%, CBG 145. By this time, 2-51min had passed. Patient was alert and oriented at this point. Patient reports that these are the same episodes she has been having. No rhythm abnormalities noted during this episode. She reports that she suddenly feels like she has no control over her body and she feels herself drifting away. Patient strongly advised to avoid driving unless instructed otherwise by a physician and she verbalizes understanding of instructions. Reviewed episode and vital signs with Dr. Tamala Julian (DOD) who advised that patient seek treatment at the ED as she is stable now. Patient declines transport to the hospital at this time as she feels these episodes are ongoing. Encouraged patient to seek follow-up immediately and patient states that she has upcoming appointments with Dr. Debara Pickett and Dr. Maudie Mercury. Advised patient to discuss possible neuro consult with PCP and patient verbalizes understanding of instructions."  08/17/2016  Katie Mcintyre returns today for follow-up. She reports no further episodes of presyncope. She's also had negative workup so far is to the etiology for this. EEG was negative. Her remote pacer checks of her loop recorder do not demonstrate any significant arrhythmias. I'm still concerned that she may have sleep disorder such as sleep apnea or narcolepsy. She is scheduled for sleep study at the end of September. Have highly encouraged her to keep this appointment.  05/08/2018  Katie Mcintyre was seen today in follow-up.  Recently she saw Leonia Reader, DNP, for elevated blood pressure.  She also  reports chronic dyspnea on exertion and fatigue.  She had an echocardiogram which showed normal systolic function and mild diastolic dysfunction.  A sleep study was ordered  but has not yet been obtained.  Her hydralazine was increased to 50 mg 3 times daily.  Blood pressure initially was elevated today however came down to 146/80 on recheck.  She says she does not follow her blood pressures at home although her cuff is accurate and was checked with our cuff here.  11/05/2018  Katie Mcintyre is seen today in follow-up.  Blood pressure continues to be elevated.  She has had no further syncopal episodes however her loop recorder's have indicated brief episodes of may be paroxysmal atrial tachycardia, possibly PAF.  Most of the episodes of been less than 6 minutes however more recently she had one more than 10 minutes.  She seems to be asymptomatic with these.  This was not the initial reason for her loop recorder.  I do not believe these episodes were causing her syncope.  We did discuss the possibility of A. fib and how it may increase her risk of stroke, particularly episodes that lasts more than 6 minutes.  However at this point I would not recommend anticoagulation.  Blood pressure still is poorly controlled.  She did have a sleep study which did not show definite sleep apnea however she has had ongoing fatigue and symptoms which are going to be further evaluated by a nocturnal polysomnogram.  PMHx:  Past Medical History:  Diagnosis Date  . Anemia   . Arthritis   . Cancer ([place]) 1969   , Status post complete hysterectomy per patient  . Cataract    removed both eyes with lens implant both   . Chicken pox   . Chronic bronchitis ([place])   . Colon polyp   . Depression 06/30/2013  . Diabetes mellitus without complication ([place])   . Diverticulitis   . Frequent headaches   . GERD (gastroesophageal reflux disease)   . Gout 06/30/2013  . Hx of dizziness   . Hyperlipidemia   . Hypertension   . Post-operative nausea and vomiting   . S/P epidural steroid injection 09/27/2016   done in back at [place]   . Spinal stenosis of lumbar region    s/p spine surgery 02/12/13 with Dr  Marcos Eke at [facility]  . Thyroid disease   . Urine incontinence   . Use of cane as ambulatory aid     Past Surgical History:  Procedure Laterality Date  . ABDOMINAL HYSTERECTOMY  1970   complete hysterectomy for cervical cancer  . BACK SURGERY  01/2013  . BACK SURGERY  1995  . CATARACT EXTRACTION, BILATERAL     with lens implant   . CERVICAL CONE BIOPSY  1970  . COLONOSCOPY    . EP IMPLANTABLE DEVICE N/A 02/15/2016   Procedure: Loop Recorder Insertion;  Surgeon: Sanda Klein, MD;  Location: [place] [facility];  Service: Cardiovascular;  Laterality: N/A;  . POLYPECTOMY    . WRIST SURGERY  2010   for wrist fracture    FAMHx:  Family History  Problem Relation Age of Onset  . Alcohol abuse Father   . Cirrhosis Father   . Heart attack Brother   . Breast cancer Sister        Reports all family members with breast cancer have had genetic testing that was negative  . Cancer Mother        renal cancer  . Stroke Paternal Grandmother   .  Alcohol abuse Brother   . Breast cancer Sister        renal cancer  . Diabetes Brother   . Heart Problems Daughter   . Healthy Daughter   . Healthy Daughter   . Healthy Son   . Healthy Son   . Arthritis Unknown        parents  . Hyperlipidemia Unknown        parent  . Hypertension Unknown        parent/grandparent  . Diabetes Unknown        parent/grandparent  . Heart disease Other   . Mental illness Other   . Colon cancer Neg Hx   . Esophageal cancer Neg Hx   . Prostate cancer Neg Hx   . Rectal cancer Neg Hx   . Seizures Neg Hx   . Colon polyps Neg Hx     SOCHx:   reports that she quit smoking about 53 years ago. She has never used smokeless tobacco. She reports that she drinks alcohol. She reports that she does not use drugs.  ALLERGIES:  Allergies  Allergen Reactions  . Advil [Ibuprofen]     Was taking this for pain and had some ? Swollen lips  . Lyrica [Pregabalin]     Extreme swelling legs    ROS: Pertinent items noted  in HPI and remainder of comprehensive ROS otherwise negative.  HOME MEDS: Current Outpatient Medications  Medication Sig Dispense Refill  . ACCU-CHEK AVIVA PLUS test strip USE AS INSTRUCTED TO CHECK BLOOD SUGAR TWICE A DAY * E11.9* 100 each 5  . ACCU-CHEK FASTCLIX LANCETS MISC USE AS DIRECTED 102 each 5  . acetaminophen (TYLENOL) 500 MG tablet Take 1,000 mg by mouth every 8 (eight) hours as needed.    Marland Kitchen amLODipine (NORVASC) 5 MG tablet TAKE 1 TABLET BY MOUTH ONCE DAILY 90 tablet 1  . B-D UF III MINI PEN NEEDLES 31G X 5 MM MISC USE TO INJECT INSULIN TWICE A DAY 100 each 3  . Blood Glucose Calibration (ACCU-CHEK AVIVA) SOLN Use as directed. 1 each 1  . calcium-vitamin D (OSCAL 500/200 D-3) 500-200 MG-UNIT per tablet Take 1 tablet by mouth daily.    . cloNIDine (CATAPRES) 0.1 MG tablet TAKE 1 TABLET BY MOUTH TWICE A DAY 180 tablet 1  . ferrous sulfate 325 (65 FE) MG tablet Take 325 mg by mouth daily with breakfast.    . gabapentin (NEURONTIN) 100 MG capsule TAKE 1 CAPSULE (100 MG TOTAL) BY MOUTH AT BEDTIME. 90 capsule 2  . hydrALAZINE (APRESOLINE) 50 MG tablet TAKE 1 TABLET BY MOUTH THREE TIMES A DAY 270 tablet 1  . Insulin Lispro Prot & Lispro (HUMALOG MIX 75/25 KWIKPEN) (75-25) 100 UNIT/ML Kwikpen Inject 15 Units into the skin 2 (two) times daily before a meal. 15 pen 4  . irbesartan (AVAPRO) 150 MG tablet Take 1 tablet (150 mg total) by mouth daily. 90 tablet 3  . irbesartan (AVAPRO) 75 MG tablet Take 2 tablets (150 mg total) by mouth daily. 180 tablet 1  . isosorbide dinitrate (ISORDIL) 20 MG tablet TAKE 1 TABLET (20 MG TOTAL) BY MOUTH 2 (TWO) TIMES DAILY. 60 tablet 5  . isosorbide dinitrate (ISORDIL) 20 MG tablet TAKE 1 TABLET (20 MG TOTAL) BY MOUTH 2 (TWO) TIMES DAILY. 180 tablet 1  . Lancet Devices (ACCU-CHEK SOFTCLIX) lancets Use as instructed twice a day 1 each 5  . levothyroxine (SYNTHROID, LEVOTHROID) 125 MCG tablet TAKE 1 TABLET BY MOUTH EVERY DAY 90  tablet 1  . metFORMIN (GLUCOPHAGE)  1000 MG tablet TAKE 1 TABLET BY MOUTH TWICE DAILY WITH MEALS 180 tablet 1  . Sennosides-Docusate Sodium (STOOL SOFTENER & LAXATIVE PO) Take 1 tablet by mouth daily as needed (constipation).     . simvastatin (ZOCOR) 20 MG tablet TAKE 1 TABLET BY MOUTH EVERY DAY 90 tablet 1  . vitamin B-12 (CYANOCOBALAMIN) 1000 MCG tablet Take 1,000 mcg by mouth daily.     Current Facility-Administered Medications  Medication Dose Route Frequency Provider Last Rate Last Dose  . 0.9 %  sodium chloride infusion  500 mL Intravenous Once Pyrtle, Lajuan Lines, MD      . ipratropium-albuterol (DUONEB) 0.5-2.5 (3) MG/3ML nebulizer solution 3 mL  3 mL Nebulization Once Delano Metz, FNP        LABS/IMAGING: No results found for this or any previous visit (from the past 48 hour(s)). No results found.  WEIGHTS: Wt Readings from Last 3 Encounters:  11/05/18 255 lb (115.7 kg)  10/14/18 250 lb (113.4 kg)  09/24/18 250 lb (113.4 kg)    VITALS: BP (!) 192/74   Pulse 65   Ht 5' 1.5" (1.562 m)   Wt 255 lb (115.7 kg)   BMI 47.40 kg/m   EXAM: General appearance: alert, no distress and morbidly obese Neck: no carotid bruit and no JVD Lungs: clear to auscultation bilaterally Heart: regular rate and rhythm Abdomen: soft, non-tender; bowel sounds normal; no masses,  no organomegaly and morbidly obese Extremities: edema trace sockline edema and venous stasis dermatitis noted Pulses: 2+ and symmetric Skin: Skin color, texture, turgor normal. No rashes or lesions Neurologic: Grossly normal Psych: Pleasant  EKG: Sinus rhythm PACs at 65, left anterior fascicular block-personally reviewed  ASSESSMENT: 1. Uncontrolled hypertension 2. Bradycardia 3. Progressive dyspnea and exertion -LVEF 60 to 65%, moderate LVH, grade 1 diastolic dysfunction  4. morbid obesity 5. Dyslipidemia 6. Insulin-dependent diabetes 7. Excessive daytime sleepiness-no significant apnea, but possible narcolepsy  PLAN: 1.   Katie Mcintyre  continues to be significantly fatigued.  Her blood pressure is poorly controlled.  She is noted to have paroxysmal atrial arrhythmias, possibly AF.  These episodes have been brief except recently a little longer at 10 minutes.  I recommend she start on low-dose diltiazem for additional blood pressure and rhythm control.  I am concerned about giving her beta-blockers due to the possible worsening of her fatigue and bradycardia.  We will have to monitor heart rate with the addition of diltiazem.  Finally, since we are starting diltiazem, the dose of her simvastatin 40 mg is contraindicated with that dose of diltiazem.  We will go ahead and switch her over to atorvastatin 40 mg nightly which should be better tolerated.  Plan follow-up with me in 6 months or sooner as necessary.  Pixie Casino, MD, [facility], [place] Director of the [facility] &  Card[facility] Diplomate of the American Board of Clinical Lipidology Attending Cardiologist  Direct Dial: [phone]  Fax: [phone]  Website:  [email or web address] 11/05/2018, 11:49 AM

## 2018-11-08 ENCOUNTER — Telehealth: Payer: Self-pay | Admitting: Internal Medicine

## 2018-11-08 MED ORDER — DILTIAZEM HCL ER 120 MG PO CP24: 120.0000 mg | ORAL_CAPSULE | Freq: Every day | ORAL | 3 refills | Status: DC

## 2018-11-08 NOTE — Telephone Encounter (Signed)
Spoke with [facility]. Tablet form of dilt 120mg  24 hour is brand name, thus most costly. Capsule form is generic. Changed Rx to capsule, same dose, frequency. Rx(s) sent to pharmacy electronically. Patient called and made aware

## 2018-11-08 NOTE — Telephone Encounter (Signed)
New Message    Pt c/o medication issue:  1. Name of Medication: diltiazem (CARDIZEM LA) 120 MG 24 hr tablet  2. How are you currently taking this medication (dosage and times per day)?   3. Are you having a reaction (difficulty breathing--STAT)?   4. What is your medication issue? Patient is calling because she was prescribed this medication but she states that its to expensive. She would like to be prescribed something else. Please call to discuss.

## 2018-11-08 NOTE — Telephone Encounter (Signed)
Returned call to pt she states that she was here for appt 11-12 and was rx'd diltiazem. She states that it is too expensive and she cannot afford it for #90 $100. Can we rx something else? Please advise

## 2018-11-11 NOTE — Progress Notes (Deleted)
Subjective:   Katie Mcintyre is a 77 y.o. female who presents for Medicare Annual (Subsequent) preventive examination.  Reports health as  2018 Presents sad today; can't walk like she should  Lives in single family home Buys her own groceries  Bathroom is not handicapped accessible but has seat in the shower  Need support: has next door neighbor  Moved here to be with family and they moved They want her to move  Had 5 children   Has a masters in [employer]; supervision;  Was a principle  Retired 2005   Diet  Breakfast  Diet is "terrible" usually gets up and has coffee and grits Then eggs and sometimes meat  Some fruit  Dinner around 4 to 6  Trying to eat chicken; roast beef; hamburger  Pork ribs; cooked barbecued  Frozen vegetables  Eating a nutty buddy every pm  Difficulty getting up at hs to the bathroom   BMI 46   Exercise Got sick at Y Go to pedal on bike sometimes Move at home  Started using a cane years ago  Had a walker since 2008; use it around the home   Health Maintenance Due  Topic Date Due  . FOOT EXAM  04/16/2018  . INFLUENZA VACCINE  07/25/2018          Objective:     Vitals: There were no vitals taken for this visit.  There is no height or weight on file to calculate BMI.  Advanced Directives 09/24/2018 11/06/2017 04/22/2017 11/07/2016 10/05/2016 02/15/2016  Does Patient Have a Medical Advance Directive? No No No No No No  Would patient like information on creating a medical advance directive? Yes (Inpatient - patient defers creating a medical advance directive at this time) - - Yes - Educational materials given - No - patient declined information    Tobacco Social History   Tobacco Use  Smoking Status Former Smoker  . Last attempt to quit: 12/25/1964  . Years since quitting: 53.9  Smokeless Tobacco Never Used  Tobacco Comment   smoked 6 years / when she was in college      Counseling given: Not Answered Comment: smoked 6 years /  when she was in college    Clinical Intake:                       Past Medical History:  Diagnosis Date  . Anemia   . Arthritis   . Cancer ([place]) 1969   , Status post complete hysterectomy per patient  . Cataract    removed both eyes with lens implant both   . Chicken pox   . Chronic bronchitis ([place])   . Colon polyp   . Depression 06/30/2013  . Diabetes mellitus without complication ([place])   . Diverticulitis   . Frequent headaches   . GERD (gastroesophageal reflux disease)   . Gout 06/30/2013  . Hx of dizziness   . Hyperlipidemia   . Hypertension   . Post-operative nausea and vomiting   . S/P epidural steroid injection 09/27/2016   done in back at [place]   . Spinal stenosis of lumbar region    s/p spine surgery 02/12/13 with Dr Marcos Eke at [facility]  . Thyroid disease   . Urine incontinence   . Use of cane as ambulatory aid    Past Surgical History:  Procedure Laterality Date  . ABDOMINAL HYSTERECTOMY  1970   complete hysterectomy for cervical cancer  . BACK SURGERY  01/2013  .  BACK SURGERY  1995  . CATARACT EXTRACTION, BILATERAL     with lens implant   . CERVICAL CONE BIOPSY  1970  . COLONOSCOPY    . EP IMPLANTABLE DEVICE N/A 02/15/2016   Procedure: Loop Recorder Insertion;  Surgeon: Sanda Klein, MD;  Location: [facility];  Service: Cardiovascular;  Laterality: N/A;  . POLYPECTOMY    . WRIST SURGERY  2010   for wrist fracture   Family History  Problem Relation Age of Onset  . Alcohol abuse Father   . Cirrhosis Father   . Heart attack Brother   . Breast cancer Sister        Reports all family members with breast cancer have had genetic testing that was negative  . Cancer Mother        renal cancer  . Stroke Paternal Grandmother   . Alcohol abuse Brother   . Breast cancer Sister        renal cancer  . Diabetes Brother   . Heart Problems Daughter   . Healthy Daughter   . Healthy Daughter   . Healthy Son   . Healthy Son   . Arthritis Unknown         parents  . Hyperlipidemia Unknown        parent  . Hypertension Unknown        parent/grandparent  . Diabetes Unknown        parent/grandparent  . Heart disease Other   . Mental illness Other   . Colon cancer Neg Hx   . Esophageal cancer Neg Hx   . Prostate cancer Neg Hx   . Rectal cancer Neg Hx   . Seizures Neg Hx   . Colon polyps Neg Hx    Social History   Socioeconomic History  . Marital status: Divorced    Spouse name: Not on file  . Number of children: 5  . Years of education: 68  . Highest education level: Not on file  Occupational History  . Occupation: Retired  Scientific laboratory technician  . Financial resource strain: Not on file  . Food insecurity:    Worry: Not on file    Inability: Not on file  . Transportation needs:    Medical: Not on file    Non-medical: Not on file  Tobacco Use  . Smoking status: Former Smoker    Last attempt to quit: 12/25/1964    Years since quitting: 53.9  . Smokeless tobacco: Never Used  . Tobacco comment: smoked 6 years / when she was in college   Substance and Sexual Activity  . Alcohol use: Yes    Comment: occ with steak dinner   . Drug use: No  . Sexual activity: Not on file  Lifestyle  . Physical activity:    Days per week: Not on file    Minutes per session: Not on file  . Stress: Not on file  Relationships  . Social connections:    Talks on phone: Not on file    Gets together: Not on file    Attends religious service: Not on file    Active member of club or organization: Not on file    Attends meetings of clubs or organizations: Not on file    Relationship status: Not on file  Other Topics Concern  . Not on file  Social History Narrative   Work or School: retired Nurse, children's Situation: lives with son in [place]  Spiritual Beliefs: Christian      Lifestyle: exercising on regular visit; trying to work on diet            Epworth Sleepiness Scale = 20 (as of 02/02/2016)           Outpatient Encounter Medications as of 11/12/2018  Medication Sig  . ACCU-CHEK AVIVA PLUS test strip USE AS INSTRUCTED TO CHECK BLOOD SUGAR TWICE A DAY * E11.9*  . ACCU-CHEK FASTCLIX LANCETS MISC USE AS DIRECTED  . acetaminophen (TYLENOL) 500 MG tablet Take 1,000 mg by mouth every 8 (eight) hours as needed.  Marland Kitchen amLODipine (NORVASC) 5 MG tablet TAKE 1 TABLET BY MOUTH ONCE DAILY  . atorvastatin (LIPITOR) 40 MG tablet Take 1 tablet (40 mg total) by mouth daily.  . B-D UF III MINI PEN NEEDLES 31G X 5 MM MISC USE TO INJECT INSULIN TWICE A DAY  . Blood Glucose Calibration (ACCU-CHEK AVIVA) SOLN Use as directed.  . calcium-vitamin D (OSCAL 500/200 D-3) 500-200 MG-UNIT per tablet Take 1 tablet by mouth daily.  . cloNIDine (CATAPRES) 0.1 MG tablet TAKE 1 TABLET BY MOUTH TWICE A DAY  . diltiazem (DILACOR XR) 120 MG 24 hr capsule Take 1 capsule (120 mg total) by mouth daily.  . ferrous sulfate 325 (65 FE) MG tablet Take 325 mg by mouth daily with breakfast.  . gabapentin (NEURONTIN) 100 MG capsule TAKE 1 CAPSULE (100 MG TOTAL) BY MOUTH AT BEDTIME.  . hydrALAZINE (APRESOLINE) 50 MG tablet TAKE 1 TABLET BY MOUTH THREE TIMES A DAY  . Insulin Lispro Prot & Lispro (HUMALOG MIX 75/25 KWIKPEN) (75-25) 100 UNIT/ML Kwikpen Inject 15 Units into the skin 2 (two) times daily before a meal.  . irbesartan (AVAPRO) 150 MG tablet Take 1 tablet (150 mg total) by mouth daily.  . irbesartan (AVAPRO) 75 MG tablet Take 2 tablets (150 mg total) by mouth daily.  . isosorbide dinitrate (ISORDIL) 20 MG tablet TAKE 1 TABLET (20 MG TOTAL) BY MOUTH 2 (TWO) TIMES DAILY.  . isosorbide dinitrate (ISORDIL) 20 MG tablet TAKE 1 TABLET (20 MG TOTAL) BY MOUTH 2 (TWO) TIMES DAILY.  Marland Kitchen Lancet Devices (ACCU-CHEK SOFTCLIX) lancets Use as instructed twice a day  . levothyroxine (SYNTHROID, LEVOTHROID) 125 MCG tablet TAKE 1 TABLET BY MOUTH EVERY DAY  . metFORMIN (GLUCOPHAGE) 1000 MG tablet TAKE 1 TABLET BY MOUTH TWICE DAILY WITH MEALS  .  Sennosides-Docusate Sodium (STOOL SOFTENER & LAXATIVE PO) Take 1 tablet by mouth daily as needed (constipation).   . vitamin B-12 (CYANOCOBALAMIN) 1000 MCG tablet Take 1,000 mcg by mouth daily.   Facility-Administered Encounter Medications as of 11/12/2018  Medication  . 0.9 %  sodium chloride infusion  . ipratropium-albuterol (DUONEB) 0.5-2.5 (3) MG/3ML nebulizer solution 3 mL    Activities of Daily Living No flowsheet data found.  Patient Care Team: [name], DO as PCP - General (Family Medicine) [name], DO (Family Medicine) Jola Schmidt, MD as Consulting Physician (Ophthalmology) Ara Kussmaul, MD as Consulting Physician (Ophthalmology)    Assessment:   This is a routine wellness examination for Katie Mcintyre.  Exercise Activities and Dietary recommendations    Goals    . Exercise 150 minutes per week (moderate activity)     [facility] and [facility] to walk 2 laps  Horizontal bike     . Weight (lb) < 200 lb (90.7 kg)     Lose 10 lbs Look for [employer]  Go to the gym more  3 times  A wqeek       Fall Risk Fall Risk  11/06/2017 11/07/2016 11/07/2016 06/01/2015 08/21/2013  Falls in the past year? No No No No No   Is the patient's home free of loose throw rugs in walkways, pet beds, electrical cords, etc?   {Blank single:19197::"yes","no"}      Grab bars in the bathroom? {Blank single:19197::"yes","no"}      Handrails on the stairs?   {Blank single:19197::"yes","no"}      Adequate lighting?   {Blank single:19197::"yes","no"}  Timed Get Up and Go performed: ***  Depression Screen PHQ 2/9 Scores 02/07/2018 11/06/2017 11/07/2016 11/07/2016  PHQ - 2 Score 0 0 0 0  PHQ- 9 Score 12 - - -     Cognitive Function MMSE - Mini Mental State Exam 11/06/2017 11/07/2016  Not completed: (No Data) (No Data)        Immunization History  Administered Date(s) Administered  . Influenza, High Dose Seasonal PF 09/27/2015, 11/07/2016, 08/21/2017  .  Influenza,inj,Quad PF,6+ Mos 08/21/2013, 09/11/2014  . Pneumococcal Conjugate-13 03/01/2015  . Pneumococcal Polysaccharide-23 06/30/2013  . Tdap 06/30/2013    Qualifies for Shingles Vaccine?***  Screening Tests Health Maintenance  Topic Date Due  . FOOT EXAM  04/16/2018  . INFLUENZA VACCINE  07/25/2018  . DEXA SCAN  02/28/2025 (Originally 03/28/2005)  . HEMOGLOBIN A1C  01/08/2019  . OPHTHALMOLOGY EXAM  01/10/2019  . TETANUS/TDAP  07/01/2023    Cancer Screenings: Lung: Low Dose CT Chest recommended if Age 2-80 years, 30 pack-year currently smoking OR have quit w/in 15years. Patient {DOES NOT does:27190::"does not"} qualify. Breast:  Up to date on Mammogram? {Yes/No:30480221}   Up to date of Bone Density/Dexa? {Yes/No:30480221} Colorectal: ***  Additional Screenings: ***: Hepatitis C Screening:      Plan:   ***   I have personally reviewed and noted the following in the patient's chart:   . Medical and social history . Use of alcohol, tobacco or illicit drugs  . Current medications and supplements . Functional ability and status . Nutritional status . Physical activity . Advanced directives . List of other physicians . Hospitalizations, surgeries, and ER visits in previous 12 months . Vitals . Screenings to include cognitive, depression, and falls . Referrals and appointments  In addition, I have reviewed and discussed with patient certain preventive protocols, quality metrics, and best practice recommendations. A written personalized care plan for preventive services as well as general preventive health recommendations were provided to patient.     Wynetta Fines, RN  11/11/2018

## 2018-11-12 ENCOUNTER — Ambulatory Visit: Payer: Medicare Other

## 2018-11-12 ENCOUNTER — Ambulatory Visit: Payer: Medicare Other | Admitting: Family Medicine

## 2018-11-12 ENCOUNTER — Telehealth: Payer: Self-pay | Admitting: Internal Medicine

## 2018-11-12 ENCOUNTER — Encounter: Payer: Self-pay | Admitting: Family Medicine

## 2018-11-12 VITALS — BP 112/68 | HR 64 | Temp 97.7°F | Ht 61.5 in | Wt 255.5 lb

## 2018-11-12 DIAGNOSIS — Z794 Long term (current) use of insulin: Secondary | ICD-10-CM | POA: Diagnosis not present

## 2018-11-12 DIAGNOSIS — I152 Hypertension secondary to endocrine disorders: Secondary | ICD-10-CM

## 2018-11-12 DIAGNOSIS — E039 Hypothyroidism, unspecified: Secondary | ICD-10-CM

## 2018-11-12 DIAGNOSIS — E1159 Type 2 diabetes mellitus with other circulatory complications: Secondary | ICD-10-CM

## 2018-11-12 DIAGNOSIS — E785 Hyperlipidemia, unspecified: Secondary | ICD-10-CM

## 2018-11-12 DIAGNOSIS — I480 Paroxysmal atrial fibrillation: Secondary | ICD-10-CM

## 2018-11-12 DIAGNOSIS — E1169 Type 2 diabetes mellitus with other specified complication: Secondary | ICD-10-CM | POA: Diagnosis not present

## 2018-11-12 DIAGNOSIS — Z23 Encounter for immunization: Secondary | ICD-10-CM | POA: Diagnosis not present

## 2018-11-12 DIAGNOSIS — I10 Essential (primary) hypertension: Secondary | ICD-10-CM

## 2018-11-12 LAB — BASIC METABOLIC PANEL
BUN: 22 mg/dL (ref 6–23)
CO2: 28 meq/L (ref 19–32)
Calcium: 9.6 mg/dL (ref 8.4–10.5)
Chloride: 101 mEq/L (ref 96–112)
Creatinine, Ser: 0.9 mg/dL (ref 0.40–1.20)
GFR: 77.75 mL/min (ref >60.00)
GLUCOSE: 123 mg/dL — AB (ref 70–99)
POTASSIUM: 4.2 meq/L (ref 3.5–5.1)
Sodium: 139 mEq/L (ref 135–145)

## 2018-11-12 LAB — TSH: TSH: 2.87 u[IU]/mL (ref 0.35–4.50)

## 2018-11-12 LAB — CBC
HEMATOCRIT: 36.4 % (ref 36.0–46.0)
HEMOGLOBIN: 11.9 g/dL — AB (ref 12.0–15.0)
MCHC: 32.8 g/dL (ref 30.0–36.0)
MCV: 85.5 fl (ref 78.0–100.0)
PLATELETS: 307 10*3/uL (ref 150.0–400.0)
RBC: 4.26 Mil/uL (ref 3.87–5.11)
RDW: 14.4 % (ref 11.5–15.5)
WBC: 5.4 10*3/uL (ref 4.0–10.5)

## 2018-11-12 LAB — HEMOGLOBIN A1C: Hgb A1c MFr Bld: 8.6 % — ABNORMAL HIGH (ref 4.6–6.5)

## 2018-11-12 NOTE — Patient Instructions (Addendum)
BEFORE YOU LEAVE: -phq9 in Epic -labs -follow up: AWV and follow up in 3 months  Follow cardiology recs for blood pressure medications  We have ordered labs or studies at this visit. It can take up to 1-2 weeks for results and processing. IF results require follow up or explanation, we will call you with instructions. Clinically stable results will be released to your [facility]. If you have not heard from [place] or cannot find your results in [facility] in 2 weeks please contact our office at [phone].  If you are not yet signed up for [facility], please consider signing up.   We recommend the following healthy lifestyle for LIFE: 1) Small portions. But, make sure to get regular (at least 3 per day), healthy meals and small healthy snacks if needed.  2) Eat a healthy clean diet.   TRY TO EAT: -at least 5-7 servings of low sugar, colorful, and nutrient rich vegetables per day (not corn, potatoes or bananas.) -berries are the best choice if you wish to eat fruit (only eat small amounts if trying to reduce weight)  -lean meets (fish, white meat of chicken or [place]) -vegan proteins for some meals - beans or tofu, whole grains, nuts and seeds -Replace bad fats with good fats - good fats include: fish, nuts and seeds, canola oil, olive oil -small amounts of low fat or non fat dairy -small amounts of100 % whole grains - check the lables -drink plenty of water  AVOID: -SUGAR, sweets, anything with added sugar, corn syrup or sweeteners - must read labels as even foods advertised as "healthy" often are loaded with sugar -if you must have a sweetener, small amounts of stevia may be best -sweetened beverages and artificially sweetened beverages -simple starches (rice, bread, potatoes, pasta, chips, etc - small amounts of 100% whole grains are ok) -red meat, pork, butter -fried foods, fast food, processed food, excessive dairy, eggs and coconut.  3)Get at least 150 minutes of sweaty aerobic exercise  per week.  4)Reduce stress - consider counseling, meditation and relaxation to balance other aspects of your life.

## 2018-11-12 NOTE — Telephone Encounter (Signed)
Returned call to patient.She stated she saw Dr.Hilty 11/05/18 he prescribed Diltiazem 120 mg.She wanted to make sure she is to keep taking other B/P meds.After reviewing chart she is to continue all other B/P meds.Advised she was to stop simvastatin and start atorvastatin 40 mg daily.

## 2018-11-12 NOTE — Telephone Encounter (Signed)
New Message:     Pt wants to know if she needs to take  Both Diltiazem and Irbesartan?  She is not sure if she still needs to take the Irbesartan?

## 2018-11-12 NOTE — Progress Notes (Signed)
HPI:  Using dictation device. Unfortunately this device frequently misinterprets words/phrases.  Katie Mcintyre is a pleasant 77 y.o. here for follow up. Chronic medical problems summarized below were reviewed for changes. Reports doing ok. Saw her cardiologist recently and they started her on diltiazem for PAF. Advised against [facility].  BS in the 100-190 range fasting. No low blood sugars. Reports increased insulin 1 unit after last visit to 14 units. Diet poor. No exercise. Sees foot doctor. Denies low bs, foot wounds, CP, SOB, DOE, treatment intolerance or new symptoms. Due for labs, foot exam and flu shot  Diabetes: -insulin dependent -meds: metformin, humolog mix 75/35 13-14 units -podiatrist: Dr. Amalia Hailey, [name] -opthomologist:  -on arb  Morbid obesity: -poor diet, no exercise -felt best when she was getting regular exercise -caregiver for many in her family -no exercise, poor diet 10/2018  Hypothyroidism: -meds: synthroid  Hx syncope, presyncope, bradycardia, PAF, HTN, HLD, Dyspnea, Chronic LE edema: -sees cardiology and neurology -meds: norvasc, clonidine, hydralazine, ibesartan, diltiazem, isordil, simvastatin -s/p implantable loop recorder -echo 6269: grade 1 diastolic dysfunction -cardiology ordered sleep study -she saw neurology in 2017 with extensive eval for the syncope/AMS w/ EEG and MRI -PAF dx in 2019, cardiology advised against [place]  Chronic back pain: -hx surgery at [facility] with Dr. Marcos Eke -has had injections  Mild recurrent depression: -no medications  Hx iron def aneima: -chronic, takes iron chronically from prior PCP -hx colon polyps -last colonosocpy 02/2018 -also b12 def, on oral supplementation  ROS: See pertinent positives and negatives per HPI.  Past Medical History:  Diagnosis Date  . Anemia   . Arthritis   . Cancer ([place]) 1969   , Status post complete hysterectomy per patient  . Cataract    removed both eyes with lens implant  both   . Chicken pox   . Chronic bronchitis ([place])   . Colon polyp   . Depression 06/30/2013  . Diabetes mellitus without complication ([place])   . Diverticulitis   . Frequent headaches   . GERD (gastroesophageal reflux disease)   . Gout 06/30/2013  . Hx of dizziness   . Hyperlipidemia   . Hypertension   . Post-operative nausea and vomiting   . S/P epidural steroid injection 09/27/2016   done in back at [facility]   . Spinal stenosis of lumbar region    s/p spine surgery 02/12/13 with Dr Marcos [facility]ke at [facility]  . Thyroid disease   . Urine incontinence   . Use of cane as ambulatory aid     Past Surgical History:  Procedure Laterality Date  . ABDOMINAL HYSTERECTOMY  1970   complete hysterectomy for cervical cancer  . BACK SURGERY  01/2013  . BACK SURGERY  1995  . CATARACT EXTRACTION, BILATERAL     with lens implant   . CERVICAL CONE BIOPSY  1970  . COLONOSCOPY    . EP IMPLANTABLE DEVICE N/A 02/15/2016   Procedure: Loop Recorder Insertion;  Surgeon: Sanda Klein, MD;  Location: [facility];  Service: Cardiovascular;  Laterality: N/A;  . POLYPECTOMY    . WRIST SURGERY  2010   for wrist fracture    Family History  Problem Relation Age of Onset  . Alcohol abuse Father   . Cirrhosis Father   . Heart attack Brother   . Breast cancer Sister        Reports all family members with breast cancer have had genetic testing that was negative  . Cancer Mother  renal cancer  . Stroke Paternal Grandmother   . Alcohol abuse Brother   . Breast cancer Sister        renal cancer  . Diabetes Brother   . Heart Problems Daughter   . Healthy Daughter   . Healthy Daughter   . Healthy Son   . Healthy Son   . Arthritis Unknown        parents  . Hyperlipidemia Unknown        parent  . Hypertension Unknown        parent/grandparent  . Diabetes Unknown        parent/grandparent  . Heart disease Other   . Mental illness Other   . Colon cancer Neg Hx   . Esophageal cancer Neg Hx   .  Prostate cancer Neg Hx   . Rectal cancer Neg Hx   . Seizures Neg Hx   . Colon polyps Neg Hx     SOCIAL HX: see hpi   Current Outpatient Medications:  .  ACCU-CHEK AVIVA PLUS test strip, USE AS INSTRUCTED TO CHECK BLOOD SUGAR TWICE A DAY * E11.9*, Disp: 100 each, Rfl: 5 .  ACCU-CHEK FASTCLIX LANCETS MISC, USE AS DIRECTED, Disp: 102 each, Rfl: 5 .  acetaminophen (TYLENOL) 500 MG tablet, Take 1,000 mg by mouth every 8 (eight) hours as needed., Disp: , Rfl:  .  amLODipine (NORVASC) 5 MG tablet, TAKE 1 TABLET BY MOUTH ONCE DAILY, Disp: 90 tablet, Rfl: 1 .  atorvastatin (LIPITOR) 40 MG tablet, Take 1 tablet (40 mg total) by mouth daily., Disp: 90 tablet, Rfl: 3 .  B-D UF III MINI PEN NEEDLES 31G X 5 MM MISC, USE TO INJECT INSULIN TWICE A DAY, Disp: 100 each, Rfl: 3 .  Blood Glucose Calibration (ACCU-CHEK AVIVA) SOLN, Use as directed., Disp: 1 each, Rfl: 1 .  calcium-vitamin D (OSCAL 500/200 D-3) 500-200 MG-UNIT per tablet, Take 1 tablet by mouth daily., Disp: , Rfl:  .  cloNIDine (CATAPRES) 0.1 MG tablet, TAKE 1 TABLET BY MOUTH TWICE A DAY, Disp: 180 tablet, Rfl: 1 .  diltiazem (DILACOR XR) 120 MG 24 hr capsule, Take 1 capsule (120 mg total) by mouth daily., Disp: 90 capsule, Rfl: 3 .  ferrous sulfate 325 (65 FE) MG tablet, Take 325 mg by mouth daily with breakfast., Disp: , Rfl:  .  gabapentin (NEURONTIN) 100 MG capsule, TAKE 1 CAPSULE (100 MG TOTAL) BY MOUTH AT BEDTIME., Disp: 90 capsule, Rfl: 2 .  hydrALAZINE (APRESOLINE) 50 MG tablet, TAKE 1 TABLET BY MOUTH THREE TIMES A DAY, Disp: 270 tablet, Rfl: 1 .  Insulin Lispro Prot & Lispro (HUMALOG MIX 75/25 KWIKPEN) (75-25) 100 UNIT/ML Kwikpen, Inject 15 Units into the skin 2 (two) times daily before a meal., Disp: 15 pen, Rfl: 4 .  irbesartan (AVAPRO) 75 MG tablet, Take 2 tablets (150 mg total) by mouth daily., Disp: 180 tablet, Rfl: 1 .  isosorbide dinitrate (ISORDIL) 20 MG tablet, TAKE 1 TABLET (20 MG TOTAL) BY MOUTH 2 (TWO) TIMES DAILY., Disp:  180 tablet, Rfl: 1 .  Lancet Devices (ACCU-CHEK SOFTCLIX) lancets, Use as instructed twice a day, Disp: 1 each, Rfl: 5 .  levothyroxine (SYNTHROID, LEVOTHROID) 125 MCG tablet, TAKE 1 TABLET BY MOUTH EVERY DAY, Disp: 90 tablet, Rfl: 1 .  metFORMIN (GLUCOPHAGE) 1000 MG tablet, TAKE 1 TABLET BY MOUTH TWICE DAILY WITH MEALS, Disp: 180 tablet, Rfl: 1 .  Sennosides-Docusate Sodium (STOOL SOFTENER & LAXATIVE PO), Take 1 tablet by mouth daily as needed (  constipation). , Disp: , Rfl:  .  vitamin B-12 (CYANOCOBALAMIN) 1000 MCG tablet, Take 1,000 mcg by mouth daily., Disp: , Rfl:   Current Facility-Administered Medications:  .  0.9 %  sodium chloride infusion, 500 mL, Intravenous, Once, Pyrtle, Lajuan Lines, MD .  ipratropium-albuterol (DUONEB) 0.5-2.5 (3) MG/3ML nebulizer solution 3 mL, 3 mL, Nebulization, Once, Delano Metz, FNP  EXAM:  Vitals:   11/12/18 1118  BP: 112/68  Pulse: 64  Temp: 97.7 F (36.5 C)    Body mass index is 47.49 kg/m.  GENERAL: vitals reviewed and listed above, alert, oriented, appears well hydrated and in no acute distress  HEENT: atraumatic, conjunttiva clear, no obvious abnormalities on inspection of external nose and ears  NECK: no obvious masses on inspection  LUNGS: clear to auscultation bilaterally, no wheezes, rales or rhonchi, good air movement  CV: HRRR, no peripheral edema  MS: moves all extremities without noticeable abnormality  FOOT exam done  PSYCH: pleasant and cooperative, no obvious depression or anxiety  ASSESSMENT AND PLAN:  Discussed the following assessment and plan:  Type 2 diabetes mellitus with other circulatory complication, with long-term current use of insulin (HCC) - Plan: Hemoglobin A1c  Morbid obesity (HCC)  Hypothyroidism, unspecified type - Plan: TSH  Hypertension associated with diabetes ([place]) - Plan: Basic metabolic panel, CBC  Hyperlipidemia associated with type 2 diabetes mellitus (HCC)  PAF (paroxysmal atrial  fibrillation) (HCC)  -foot exam done -healthy low sugar diet and regular exercise encouraged -check labs - adjust meds as needed -she has questions about arb and diltiazem - she reports is waiting to hear back from her cardiologist, bp mildly on low end but denies orthostatic symptoms, would prefer if she could stay on arb if tolerted but will defer to her cardiologist -follow up and AWV in 3 months -Patient advised to return or notify a doctor immediately if symptoms worsen or persist or new concerns arise.  Patient Instructions  BEFORE YOU LEAVE: -labs -follow up: AWV and follow up in 3 months  Follow cardiology recs for blood pressure medications  We have ordered labs or studies at this visit. It can take up to 1-2 weeks for results and processing. IF results require follow up or explanation, we will call you with instructions. Clinically stable results will be released to your [facility]. If you have not heard from [place] or cannot find your results in [facility] in 2 weeks please contact our office at [phone].  If you are not yet signed up for [facility], please consider signing up.   We recommend the following healthy lifestyle for LIFE: 1) Small portions. But, make sure to get regular (at least 3 per day), healthy meals and small healthy snacks if needed.  2) Eat a healthy clean diet.   TRY TO EAT: -at least 5-7 servings of low sugar, colorful, and nutrient rich vegetables per day (not corn, potatoes or bananas.) -berries are the best choice if you wish to eat fruit (only eat small amounts if trying to reduce weight)  -lean meets (fish, white meat of chicken or [place]) -vegan proteins for some meals - beans or tofu, whole grains, nuts and seeds -Replace bad fats with good fats - good fats include: fish, nuts and seeds, canola oil, olive oil -small amounts of low fat or non fat dairy -small amounts of100 % whole grains - check the lables -drink plenty of water  AVOID: -SUGAR,  sweets, anything with added sugar, corn syrup or sweeteners - must read labels  as even foods advertised as "healthy" often are loaded with sugar -if you must have a sweetener, small amounts of stevia may be best -sweetened beverages and artificially sweetened beverages -simple starches (rice, bread, potatoes, pasta, chips, etc - small amounts of 100% whole grains are ok) -red meat, pork, butter -fried foods, fast food, processed food, excessive dairy, eggs and coconut.  3)Get at least 150 minutes of sweaty aerobic exercise per week.  4)Reduce stress - consider counseling, meditation and relaxation to balance other aspects of your life.        [name], DO

## 2018-11-14 ENCOUNTER — Other Ambulatory Visit: Payer: Self-pay | Admitting: Family Medicine

## 2018-11-26 ENCOUNTER — Encounter (HOSPITAL_BASED_OUTPATIENT_CLINIC_OR_DEPARTMENT_OTHER): Payer: Medicare Other

## 2018-11-27 ENCOUNTER — Encounter (HOSPITAL_BASED_OUTPATIENT_CLINIC_OR_DEPARTMENT_OTHER): Payer: Medicare Other

## 2018-11-28 ENCOUNTER — Encounter: Payer: Self-pay | Admitting: Family Medicine

## 2018-11-28 NOTE — Telephone Encounter (Signed)
I called the pt and informed her of the message below.  She agreed to contact the cardiologist and will call back with glucose readings.

## 2018-11-29 ENCOUNTER — Ambulatory Visit (INDEPENDENT_AMBULATORY_CARE_PROVIDER_SITE_OTHER): Payer: Medicare Other

## 2018-11-29 DIAGNOSIS — R55 Syncope and collapse: Secondary | ICD-10-CM | POA: Diagnosis not present

## 2018-12-02 NOTE — Progress Notes (Signed)
Carelink Summary Report / Loop Recorder 

## 2018-12-21 LAB — CUP PACEART REMOTE DEVICE CHECK
Date Time Interrogation Session: 20191103164005
MDC IDC PG IMPLANT DT: 20170221

## 2018-12-24 ENCOUNTER — Other Ambulatory Visit: Payer: Self-pay | Admitting: Cardiovascular Disease

## 2018-12-27 ENCOUNTER — Ambulatory Visit: Payer: Medicare Other | Admitting: Podiatry

## 2018-12-27 ENCOUNTER — Other Ambulatory Visit: Payer: Self-pay | Admitting: Family Medicine

## 2018-12-29 ENCOUNTER — Other Ambulatory Visit (INDEPENDENT_AMBULATORY_CARE_PROVIDER_SITE_OTHER): Payer: Self-pay | Admitting: Specialist

## 2018-12-30 NOTE — Telephone Encounter (Signed)
Gabapentin refill request 

## 2019-01-01 ENCOUNTER — Other Ambulatory Visit: Payer: Self-pay | Admitting: Family Medicine

## 2019-01-01 ENCOUNTER — Ambulatory Visit (INDEPENDENT_AMBULATORY_CARE_PROVIDER_SITE_OTHER): Payer: Medicare Other

## 2019-01-01 DIAGNOSIS — R55 Syncope and collapse: Secondary | ICD-10-CM | POA: Diagnosis not present

## 2019-01-02 ENCOUNTER — Other Ambulatory Visit (INDEPENDENT_AMBULATORY_CARE_PROVIDER_SITE_OTHER): Payer: Self-pay | Admitting: Specialist

## 2019-01-02 ENCOUNTER — Ambulatory Visit: Payer: Medicare Other

## 2019-01-02 LAB — CUP PACEART REMOTE DEVICE CHECK
Date Time Interrogation Session: 20200108170616
Implantable Pulse Generator Implant Date: 20170221

## 2019-01-02 NOTE — Telephone Encounter (Signed)
Gabapentin refill

## 2019-01-02 NOTE — Progress Notes (Signed)
Carelink Summary Report / Loop Recorder 

## 2019-01-07 ENCOUNTER — Encounter (HOSPITAL_BASED_OUTPATIENT_CLINIC_OR_DEPARTMENT_OTHER): Payer: Medicare Other

## 2019-01-08 ENCOUNTER — Encounter (HOSPITAL_BASED_OUTPATIENT_CLINIC_OR_DEPARTMENT_OTHER): Payer: Medicare Other

## 2019-01-12 LAB — CUP PACEART REMOTE DEVICE CHECK
Date Time Interrogation Session: 20191206163817
Implantable Pulse Generator Implant Date: 20170221

## 2019-01-22 ENCOUNTER — Ambulatory Visit: Payer: Medicare Other | Admitting: Podiatry

## 2019-01-23 ENCOUNTER — Other Ambulatory Visit: Payer: Self-pay | Admitting: Family Medicine

## 2019-01-31 ENCOUNTER — Other Ambulatory Visit: Payer: Self-pay | Admitting: Family Medicine

## 2019-02-03 ENCOUNTER — Ambulatory Visit (INDEPENDENT_AMBULATORY_CARE_PROVIDER_SITE_OTHER): Payer: Medicare Other

## 2019-02-03 DIAGNOSIS — I639 Cerebral infarction, unspecified: Secondary | ICD-10-CM

## 2019-02-03 DIAGNOSIS — R55 Syncope and collapse: Secondary | ICD-10-CM | POA: Diagnosis not present

## 2019-02-04 LAB — CUP PACEART REMOTE DEVICE CHECK
Date Time Interrogation Session: 20200210170630
Implantable Pulse Generator Implant Date: 20170221

## 2019-02-06 ENCOUNTER — Ambulatory Visit: Payer: Medicare Other | Admitting: Family Medicine

## 2019-02-06 ENCOUNTER — Ambulatory Visit: Payer: Medicare Other

## 2019-02-10 ENCOUNTER — Telehealth (INDEPENDENT_AMBULATORY_CARE_PROVIDER_SITE_OTHER): Payer: Self-pay | Admitting: Specialist

## 2019-02-10 NOTE — Telephone Encounter (Signed)
Patient request an appointment for a back injection. Please call @ [phone]. Katie Mcintyre Patient

## 2019-02-10 NOTE — Telephone Encounter (Signed)
Bilateral L3 TF on 09/03/18. Ok to repeat if helped, same problem/side, and no new injury?

## 2019-02-11 NOTE — Telephone Encounter (Signed)
Yes

## 2019-02-11 NOTE — Telephone Encounter (Signed)
Patient reports that pain is different than prior to last injection. Scheduled for OV 2/25 to discuss.

## 2019-02-13 ENCOUNTER — Encounter: Payer: Medicare Other | Admitting: Family Medicine

## 2019-02-13 ENCOUNTER — Ambulatory Visit: Payer: Medicare Other

## 2019-02-17 NOTE — Progress Notes (Signed)
Carelink Summary Report / Loop Recorder 

## 2019-02-18 ENCOUNTER — Ambulatory Visit: Payer: Medicare Other | Admitting: Internal Medicine

## 2019-02-18 ENCOUNTER — Ambulatory Visit (INDEPENDENT_AMBULATORY_CARE_PROVIDER_SITE_OTHER): Payer: Self-pay

## 2019-02-18 ENCOUNTER — Encounter (INDEPENDENT_AMBULATORY_CARE_PROVIDER_SITE_OTHER): Payer: Self-pay | Admitting: Physical Medicine and Rehabilitation

## 2019-02-18 ENCOUNTER — Ambulatory Visit (INDEPENDENT_AMBULATORY_CARE_PROVIDER_SITE_OTHER): Payer: Medicare Other | Admitting: Physical Medicine and Rehabilitation

## 2019-02-18 VITALS — BP 141/68 | HR 77

## 2019-02-18 DIAGNOSIS — M48062 Spinal stenosis, lumbar region with neurogenic claudication: Secondary | ICD-10-CM | POA: Diagnosis not present

## 2019-02-18 DIAGNOSIS — M7071 Other bursitis of hip, right hip: Secondary | ICD-10-CM

## 2019-02-18 MED ORDER — BUPIVACAINE HCL 0.25 % IJ SOLN
4.0000 mL | INTRAMUSCULAR | Status: AC | PRN
  Administered 2019-02-18: 4 mL via INTRA_ARTICULAR

## 2019-02-18 MED ORDER — TRIAMCINOLONE ACETONIDE 40 MG/ML IJ SUSP
60.0000 mg | INTRAMUSCULAR | Status: AC | PRN
  Administered 2019-02-18: 60 mg via INTRA_ARTICULAR

## 2019-02-18 MED ORDER — LIDOCAINE HCL 2 % IJ SOLN
4.0000 mL | INTRAMUSCULAR | Status: AC | PRN
  Administered 2019-02-18: 4 mL

## 2019-02-18 NOTE — Progress Notes (Signed)
  Numeric Pain Rating Scale and Functional Assessment Average Pain 9 Pain Right Now 5 My pain is constant, sharp, dull and aching Pain is worse with: walking and sitting Pain improves with: medication   In the last MONTH (on 0-10 scale) has pain interfered with the following?  1. General activity like being  able to carry out your everyday physical activities such as walking, climbing stairs, carrying groceries, or moving a chair?  Rating(10)  2. Relation with others like being able to carry out your usual social activities and roles such as  activities at home, at work and in your community. Rating(10)  3. Enjoyment of life such that you have  been bothered by emotional problems such as feeling anxious, depressed or irritable?  Rating(10)

## 2019-02-18 NOTE — Progress Notes (Signed)
Katie Mcintyre - 78 y.o. female MRN [id number]  Date of birth: Mar 26, 1941  Office Visit Note: Visit Date: 02/18/2019 PCP: [name], DO Referred by: [name], DO  Subjective: Chief Complaint  Patient presents with  . Lower Back - Pain  . Right Hip - Pain   HPI: Katie Mcintyre is a 78 y.o. female who comes in today For evaluation management of new onset right buttock and upper hamstring pain.  She is typically followed by Dr. Basil Dess.  We last saw her in September and completed a bilateral L3 transforaminal injection with Dr. Otho Ket request and she did get good relief with that.  She called in for Dr. Louanne Skye but stated she wanted to have an injection done.  Evidently she got on my schedule because we are going to repeat the injection while Dr. Louanne Skye was out but then this turned into a situation where it was a different sort of pain.  She reports she had a similar pain in the year 2000 timeframe.  This was prior to other lumbar issues.  She denies any left-sided complaints.  She has had no specific injury or fall or trauma.  She denies any numbness tingling or paresthesia that is new.  Pain is over the right lower buttock and hamstring insertion.  She is tender over the ischio bursa and does have pain with sitting on this area.  This is been ongoing now for several weeks without any relief.  Her course was complicated by type 2 diabetes with insulin dependence, cardiovascular disease morbid obesity as well as gout and a history of some recurrent depression.  Review of Systems  Constitutional: Negative for chills, fever, malaise/fatigue and weight loss.  HENT: Negative for hearing loss and sinus pain.   Eyes: Negative for blurred vision, double vision and photophobia.  Respiratory: Negative for cough and shortness of breath.   Cardiovascular: Negative for chest pain, palpitations and leg swelling.  Gastrointestinal: Negative for abdominal pain, nausea and vomiting.  Genitourinary:  Negative for flank pain.  Musculoskeletal: Positive for back pain and joint pain. Negative for myalgias.  Skin: Negative for itching and rash.  Neurological: Negative for tremors, focal weakness and weakness.  Endo/Heme/Allergies: Negative.   Psychiatric/Behavioral: Negative for depression.  All other systems reviewed and are negative.  Otherwise per HPI.  Assessment & Plan: Visit Diagnoses:  1. Ischial bursitis of right side   2. Spinal stenosis of lumbar region with neurogenic claudication     Plan: Findings:  Patient has a history of pretty significant stenosis particularly at L3-4 with good relief with epidural injection in September.  She is followed by Dr. Louanne Skye in our office for her lumbar spine and spine issues.  We are going to see her back for repeat injection but I guess when she stated this was a different pain she had up on our list for office visit.  Fortunately it seems like her pain is initial bursitis she has pain over the ischial bursa area she has pain with sitting pain with palpation.  We are going to complete diagnostic medically therapeutic ischio bursa injection fluoroscopic guidance.  We will have her follow-up with Dr. Merrilee Seashore in the next few weeks.  She will continue current medication and is normal.  Discussed the risk of increased chance of infection and her blood sugar increased with the diabetes.    Meds & Orders: No orders of the defined types were placed in this encounter.   Orders Placed  This Encounter  Procedures  . Large Joint Inj  . XR C-ARM NO REPORT    Follow-up: Return in about 4 weeks (around 03/18/2019) for Basil Dess, MD.   Procedures: Large Joint Inj (R. Ischial Bursa) on 02/18/2019 10:03 AM Indications: pain and diagnostic evaluation Details: 22 G 3.5 in needle, fluoroscopy-guided posterior approach  Arthrogram: No  Medications: 4 mL lidocaine 2 %; 4 mL bupivacaine 0.25 %; 60 mg triamcinolone acetonide 40 MG/ML Outcome: tolerated well, no  immediate complications  There was excellent flow of contrast outlined the ischial bursa without vascular uptake. Procedure, treatment alternatives, risks and benefits explained, specific risks discussed. Consent was given by the patient. Immediately prior to procedure a time out was called to verify the correct patient, procedure, equipment, support staff and site/side marked as required. Patient was prepped and draped in the usual sterile fashion.      No notes on file   Clinical History: FINDINGS:  There is lumbosacral transitional anatomy. For the purpose of this dictation, the last well-formed disc space is designated as S1-S2 and labeled on image as such.  Minimal anterolisthesis of L4 on L5. Normal lumbar spinal alignment otherwise. Status post L3-L5 laminectomy. Marrow is unremarkable. Conus terminates at approximately L1. The visualized spinal cord morphology and signal, and the cauda equina appear normal. Negative for epidural hematoma. Small left renal cysts. Retroperitoneal soft tissues are unremarkable. Sacroiliac joints are normal.  --T11-T12: Imaged on the sagittal plane only. Large posterior disc osteophyte complex impression on ventral cord. No cord signal abnormality. Moderate bilateral neuroforaminal narrowing. --T12-L1: Mild posterior disc bulge without canal or foraminal narrowing. Mild bilateral facet degenerative disease.  --L1-L2: There is no evidence of spinal stenosis, disc bulge or neural foraminal narrowing. Mild left facet degenerative disease. --L2-L3: Large posterior disc osteophyte complex and severe bilateral facet hypertrophy, resulting in moderate spinal canal narrowing and severe bilateral neuroforamina narrowing.  --L3-L4: Broad-based disc bulge and severe bilateral facet hypertrophy, resulting in the severe spinal canal and bilateral neural foraminal narrowing. --L4-L5: Status post laminectomy. Broad based disc bulge and severe bilateral facet  hypertrophy resulting in mild spinal canal narrowing and severe left and mild right neuroforaminal narrowing.  --L5-S1: Status post laminectomy. No disc bulge or spinal canal narrowing. Severe bilateral neuroforamina narrowing. Severe bilateral facet hypertrophy   IMPRESSION: 1. Spinal canal narrowing is moderate at L2-L3, severe at L3-L4, and mild at L4-L5, mostly from degenerative facet disease. 2. Multilevel neuroforaminal narrowing, which is severe at bilateral L2-L3, bilateral L3-L4,, left L4-L5, and bilateral L5-S1.   Electronically Reviewed BZ:JIRCV Roosevelt Locks, MD Electronically Reviewed on:09/09/2016 7:55 PM   She reports that she quit smoking about 54 years ago. She has never used smokeless tobacco.  Recent Labs    07/08/18 1127 11/12/18 1205  HGBA1C 7.9* 8.6*    Objective:  VS:  HT:    WT:   BMI:     BP:(!) 141/68  HR:77bpm  TEMP: ( )  RESP:99 % Physical Exam Vitals signs and nursing note reviewed.  Constitutional:      General: She is not in acute distress.    Appearance: Normal appearance. She is well-developed. She is obese.  HENT:     Head: Normocephalic and atraumatic.     Nose: Nose normal.     Mouth/Throat:     Mouth: Mucous membranes are moist.     Pharynx: Oropharynx is clear.  Eyes:     Conjunctiva/sclera: Conjunctivae normal.     Pupils: Pupils are equal, round, and  reactive to light.  Neck:     Musculoskeletal: Normal range of motion and neck supple.  Cardiovascular:     Rate and Rhythm: Regular rhythm.  Pulmonary:     Effort: Pulmonary effort is normal. No respiratory distress.  Abdominal:     General: There is no distension.     Palpations: Abdomen is soft.     Tenderness: There is no guarding.  Musculoskeletal:     Right lower leg: Edema present.     Left lower leg: Edema present.     Comments: Patient ambulates very slowly with a forward flexed lumbar spine using a cane.  She is very slow to rise from seated position to full extension  but pain with facet loading.  She has concordant pain on the right side with palpation over the issue him.  No pain over the greater trochanter.  She has no pain with internal hip rotation bilaterally.  She has good distal strength.  Skin:    General: Skin is warm and dry.     Findings: No erythema or rash.  Neurological:     General: No focal deficit present.     Mental Status: She is alert and oriented to person, place, and time.     Motor: No abnormal muscle tone.     Coordination: Coordination normal.     Gait: Gait abnormal.  Psychiatric:        Mood and Affect: Mood normal.        Behavior: Behavior normal.        Thought Content: Thought content normal.     Ortho Exam Imaging: No results found.  Past Medical/Family/Surgical/Social History: Medications & Allergies reviewed per EMR, new medications updated. Patient Active Problem List   Diagnosis Date Noted  . PAF (paroxysmal atrial fibrillation) ([place]) 11/12/2018  . Hyperlipidemia associated with type 2 diabetes mellitus ([place]) 11/12/2018  . Hypertension associated with diabetes ([place]) 02/28/2018  . Mild episode of recurrent major depressive disorder ([place]) 08/21/2017  . Morbid obesity ([place]) 01/15/2017  . Status post placement of implantable loop recorder 08/17/2016  . Syncope 02/02/2016  . Bradycardia 02/02/2016  . SOB (shortness of breath) 02/02/2016  . Pre-syncope 02/02/2016  . History of colonic polyps 01/11/2016  . Type 2 diabetes mellitus with circulatory disorder, with long-term current use of insulin ([place]) 01/11/2016  . Chronic back pain - managed by Dr. Marcos Eke at [facility] 03/12/2014  . Gout 06/30/2013  . Hypothyroidism 06/30/2013  . Iron deficiency anemia 06/30/2013   Past Medical History:  Diagnosis Date  . Anemia   . Arthritis   . Cancer ([place]) 1969   , Status post complete hysterectomy per patient  . Cataract    removed both eyes with lens implant both   . Chicken pox   . Chronic bronchitis ([place])   .  Colon polyp   . Depression 06/30/2013  . Diabetes mellitus without complication ([place])   . Diverticulitis   . Frequent headaches   . GERD (gastroesophageal reflux disease)   . Gout 06/30/2013  . Hx of dizziness   . Hyperlipidemia   . Hypertension   . Post-operative nausea and vomiting   . S/P epidural steroid injection 09/27/2016   done in back at [place]   . Spinal stenosis of lumbar region    s/p spine surgery 02/12/13 with Dr Marcos Eke at [facility]  . Thyroid disease   . Urine incontinence   . Use of cane as ambulatory aid    Family History  Problem  Relation Age of Onset  . Alcohol abuse Father   . Cirrhosis Father   . Heart attack Brother   . Breast cancer Sister        Reports all family members with breast cancer have had genetic testing that was negative  . Cancer Mother        renal cancer  . Stroke Paternal Grandmother   . Alcohol abuse Brother   . Breast cancer Sister        renal cancer  . Diabetes Brother   . Heart Problems Daughter   . Healthy Daughter   . Healthy Daughter   . Healthy Son   . Healthy Son   . Arthritis Other        parents  . Hyperlipidemia Other        parent  . Hypertension Other        parent/grandparent  . Diabetes Other        parent/grandparent  . Heart disease Other   . Mental illness Other   . Colon cancer Neg Hx   . Esophageal cancer Neg Hx   . Prostate cancer Neg Hx   . Rectal cancer Neg Hx   . Seizures Neg Hx   . Colon polyps Neg Hx    Past Surgical History:  Procedure Laterality Date  . ABDOMINAL HYSTERECTOMY  1970   complete hysterectomy for cervical cancer  . BACK SURGERY  01/2013  . BACK SURGERY  1995  . CATARACT EXTRACTION, BILATERAL     with lens implant   . CERVICAL CONE BIOPSY  1970  . COLONOSCOPY    . EP IMPLANTABLE DEVICE N/A 02/15/2016   Procedure: Loop Recorder Insertion;  Surgeon: Sanda Klein, MD;  Location: [facility];  Service: Cardiovascular;  Laterality: N/A;  . POLYPECTOMY    . WRIST SURGERY  2010    for wrist fracture   Social History   Occupational History  . Occupation: Retired  Tobacco Use  . Smoking status: Former Smoker    Last attempt to quit: 12/25/1964    Years since quitting: 54.1  . Smokeless tobacco: Never Used  . Tobacco comment: smoked 6 years / when she was in college   Substance and Sexual Activity  . Alcohol use: Yes    Comment: occ with steak dinner   . Drug use: No  . Sexual activity: Not on file

## 2019-02-21 ENCOUNTER — Ambulatory Visit: Payer: Medicare Other | Admitting: Podiatry

## 2019-02-24 ENCOUNTER — Encounter (HOSPITAL_BASED_OUTPATIENT_CLINIC_OR_DEPARTMENT_OTHER): Payer: Medicare Other

## 2019-02-25 ENCOUNTER — Encounter (HOSPITAL_BASED_OUTPATIENT_CLINIC_OR_DEPARTMENT_OTHER): Payer: Medicare Other

## 2019-03-03 ENCOUNTER — Telehealth: Payer: Self-pay

## 2019-03-03 NOTE — Telephone Encounter (Signed)
I asked the patient to send a manual transmission from her home monitor.

## 2019-03-04 NOTE — Telephone Encounter (Signed)
Manual transmission received and reviewed. 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 Per Dr. Lysbeth Penner OV note from 11/05/18, [place] not recommended at this time for short duration PAT/PAF episodes. Plan to continue monitoring remotely via [facility].

## 2019-03-05 NOTE — Progress Notes (Deleted)
Subjective:   Katie Mcintyre is a 78 y.o. female who presents for Medicare Annual (Subsequent) preventive examination.  Review of Systems:  No ROS.  Medicare Wellness Visit. Additional risk factors are reflected in the social history.    Sleep patterns: {SX; SLEEP PATTERNS:18802::"feels rested on waking","does not get up to void","gets up *** times nightly to void","sleeps *** hours nightly"}.    Home Safety/Smoke Alarms: Feels safe in home. Smoke alarms in place.  Living environment; residence and Firearm Safety: {Rehab home environment / accessibility:30080::"no firearms","firearms stored safely"}. Seat Belt Safety/Bike Helmet: Wears seat belt.   Female:   Pap-       Mammo-       Dexa scan-        CCS-     Objective:     Vitals: There were no vitals taken for this visit.  There is no height or weight on file to calculate BMI.  Advanced Directives 09/24/2018 11/06/2017 04/22/2017 11/07/2016 10/05/2016 02/15/2016  Does Patient Have a Medical Advance Directive? No No No No No No  Would patient like information on creating a medical advance directive? Yes (Inpatient - patient defers creating a medical advance directive at this time) - - Yes - Educational materials given - No - patient declined information    Tobacco Social History   Tobacco Use  Smoking Status Former Smoker  . Last attempt to quit: 12/25/1964  . Years since quitting: 54.2  Smokeless Tobacco Never Used  Tobacco Comment   smoked 6 years / when she was in college      Counseling given: Not Answered Comment: smoked 6 years / when she was in college     Past Medical History:  Diagnosis Date  . Anemia   . Arthritis   . Cancer ([place]) 1969   , Status post complete hysterectomy per patient  . Cataract    removed both eyes with lens implant both   . Chicken pox   . Chronic bronchitis ([place])   . Colon polyp   . Depression 06/30/2013  . Diabetes mellitus without complication ([place])   . Diverticulitis   .  Frequent headaches   . GERD (gastroesophageal reflux disease)   . Gout 06/30/2013  . Hx of dizziness   . Hyperlipidemia   . Hypertension   . Post-operative nausea and vomiting   . S/P epidural steroid injection 09/27/2016   done in back at [facility]   . Spinal stenosis of lumbar region    s/p spine surgery 02/12/13 with Dr Marcos Eke at [facility]  . Thyroid disease   . Urine incontinence   . Use of cane as ambulatory aid    Past Surgical History:  Procedure Laterality Date  . ABDOMINAL HYSTERECTOMY  1970   complete hysterectomy for cervical cancer  . BACK SURGERY  01/2013  . BACK SURGERY  1995  . CATARACT EXTRACTION, BILATERAL     with lens implant   . CERVICAL CONE BIOPSY  1970  . COLONOSCOPY    . EP IMPLANTABLE DEVICE N/A 02/15/2016   Procedure: Loop Recorder Insertion;  Surgeon: Sanda Klein, MD;  Location: [facility];  Service: Cardiovascular;  Laterality: N/A;  . POLYPECTOMY    . WRIST SURGERY  2010   for wrist fracture   Family History  Problem Relation Age of Onset  . Alcohol abuse Father   . Cirrhosis Father   . Heart attack Brother   . Breast cancer Sister        Reports  all family members with breast cancer have had genetic testing that was negative  . Cancer Mother        renal cancer  . Stroke Paternal Grandmother   . Alcohol abuse Brother   . Breast cancer Sister        renal cancer  . Diabetes Brother   . Heart Problems Daughter   . Healthy Daughter   . Healthy Daughter   . Healthy Son   . Healthy Son   . Arthritis Other        parents  . Hyperlipidemia Other        parent  . Hypertension Other        parent/grandparent  . Diabetes Other        parent/grandparent  . Heart disease Other   . Mental illness Other   . Colon cancer Neg Hx   . Esophageal cancer Neg Hx   . Prostate cancer Neg Hx   . Rectal cancer Neg Hx   . Seizures Neg Hx   . Colon polyps Neg Hx    Social History   Socioeconomic History  . Marital status: Divorced    Spouse  name: Not on file  . Number of children: 5  . Years of education: 5  . Highest education level: Not on file  Occupational History  . Occupation: Retired  Scientific laboratory technician  . Financial resource strain: Not on file  . Food insecurity:    Worry: Not on file    Inability: Not on file  . Transportation needs:    Medical: Not on file    Non-medical: Not on file  Tobacco Use  . Smoking status: Former Smoker    Last attempt to quit: 12/25/1964    Years since quitting: 54.2  . Smokeless tobacco: Never Used  . Tobacco comment: smoked 6 years / when she was in college   Substance and Sexual Activity  . Alcohol use: Yes    Comment: occ with steak dinner   . Drug use: No  . Sexual activity: Not on file  Lifestyle  . Physical activity:    Days per week: Not on file    Minutes per session: Not on file  . Stress: Not on file  Relationships  . Social connections:    Talks on phone: Not on file    Gets together: Not on file    Attends religious service: Not on file    Active member of club or organization: Not on file    Attends meetings of clubs or organizations: Not on file    Relationship status: Not on file  Other Topics Concern  . Not on file  Social History Narrative   Work or School: retired Nurse, children's Situation: lives with son in [place]: exercising on regular visit; trying to work on diet            Epworth Sleepiness Scale = 20 (as of 02/02/2016)          Outpatient Encounter Medications as of 03/06/2019  Medication Sig  . ACCU-CHEK AVIVA PLUS test strip USE AS INSTRUCTED TO CHECK BLOOD SUGAR TWICE A DAY * E11.9*  . ACCU-CHEK FASTCLIX LANCETS MISC USE AS DIRECTED  . acetaminophen (TYLENOL) 500 MG tablet Take 1,000 mg by mouth every 8 (eight) hours as needed.  Marland Kitchen amLODipine (NORVASC) 5 MG tablet TAKE 1 TABLET BY MOUTH  ONCE DAILY  . atorvastatin (LIPITOR) 40 MG tablet Take 1 tablet (40 mg total) by  mouth daily.  . B-D UF III MINI PEN NEEDLES 31G X 5 MM MISC USE TO INJECT INSULIN TWICE A DAY  . Blood Glucose Calibration (ACCU-CHEK AVIVA) SOLN Use as directed.  . calcium-vitamin D (OSCAL 500/200 D-3) 500-200 MG-UNIT per tablet Take 1 tablet by mouth daily.  . cloNIDine (CATAPRES) 0.1 MG tablet TAKE 1 TABLET BY MOUTH TWICE A DAY  . diltiazem (DILACOR XR) 120 MG 24 hr capsule Take 1 capsule (120 mg total) by mouth daily.  . ferrous sulfate 325 (65 FE) MG tablet Take 325 mg by mouth daily with breakfast.  . gabapentin (NEURONTIN) 100 MG capsule TAKE 1 CAPSULE (100 MG TOTAL) BY MOUTH AT BEDTIME.  . hydrALAZINE (APRESOLINE) 50 MG tablet TAKE 1 TABLET BY MOUTH THREE TIMES A DAY  . Insulin Lispro Prot & Lispro (HUMALOG 75/25 MIX) (75-25) 100 UNIT/ML Kwikpen Inject 16 Units into the skin 2 (two) times daily before a meal.  . irbesartan (AVAPRO) 75 MG tablet Take 2 tablets (150 mg total) by mouth daily.  . isosorbide dinitrate (ISORDIL) 20 MG tablet TAKE 1 TABLET (20 MG TOTAL) BY MOUTH 2 (TWO) TIMES DAILY.  Marland Kitchen Lancet Devices (ACCU-CHEK SOFTCLIX) lancets Use as instructed twice a day  . levothyroxine (SYNTHROID, LEVOTHROID) 125 MCG tablet TAKE 1 TABLET BY MOUTH EVERY DAY  . metFORMIN (GLUCOPHAGE) 1000 MG tablet TAKE 1 TABLET BY MOUTH TWICE DAILY WITH MEALS  . metFORMIN (GLUCOPHAGE) 1000 MG tablet TAKE 1 TABLET BY MOUTH TWICE DAILY WITH MEALS  . Sennosides-Docusate Sodium (STOOL SOFTENER & LAXATIVE PO) Take 1 tablet by mouth daily as needed (constipation).   . vitamin B-12 (CYANOCOBALAMIN) 1000 MCG tablet Take 1,000 mcg by mouth daily.   Facility-Administered Encounter Medications as of 03/06/2019  Medication  . 0.9 %  sodium chloride infusion  . ipratropium-albuterol (DUONEB) 0.5-2.5 (3) MG/3ML nebulizer solution 3 mL    Activities of Daily Living No flowsheet data found.  Patient Care Team: [name], DO as PCP - General (Family Medicine) [name], DO (Family Medicine) Jola Schmidt, MD as Consulting Physician (Ophthalmology) Ara Kussmaul, MD as Consulting Physician (Ophthalmology)    Assessment:   This is a routine wellness examination for Katie Mcintyre. Physical assessment deferred to PCP.   Exercise Activities and Dietary recommendations   Diet (meal preparation, eat out, water intake, caffeinated beverages, dairy products, fruits and vegetables): {Desc; diets:16563}       Goals    . Exercise 150 minutes per week (moderate activity)     [facility]  Horizontal bike     . Weight (lb) < 200 lb (90.7 kg)     Lose 10 lbs Look for [employer]  Go to the gym more  3 times  A wqeek       Fall Risk Fall Risk  11/06/2017 11/07/2016 11/07/2016 06/01/2015 08/21/2013  Falls in the past year? No No No No No    Depression Screen PHQ 2/9 Scores 11/12/2018 02/07/2018 11/06/2017 11/07/2016  PHQ - 2 Score 0 0 0 0  PHQ- 9 Score 6 12 - -     Cognitive Function MMSE - Mini Mental State Exam 11/06/2017 11/07/2016  Not completed: (No Data) (No Data)        Immunization History  Administered Date(s) Administered  . Influenza, High Dose Seasonal PF 09/27/2015, 11/07/2016, 08/21/2017, 11/12/2018  .  Influenza,inj,Quad PF,6+ Mos 08/21/2013, 09/11/2014  . Pneumococcal Conjugate-13 03/01/2015  . Pneumococcal Polysaccharide-23 06/30/2013  . Tdap 06/30/2013    Qualifies for Shingles Vaccine?  Screening Tests Health Maintenance  Topic Date Due  . OPHTHALMOLOGY EXAM  01/10/2019  . DEXA SCAN  02/28/2025 (Originally 03/28/2005)  . HEMOGLOBIN A1C  05/13/2019  . FOOT EXAM  11/13/2019  . TETANUS/TDAP  07/01/2023  . INFLUENZA VACCINE  Completed        Plan:      I have personally reviewed and noted the following in the patient's chart:   . Medical and social history . Use of alcohol, tobacco or illicit drugs  . Current medications and supplements . Functional ability and status . Nutritional status .  Physical activity . Advanced directives . List of other physicians . Vitals . Screenings to include cognitive, depression, and falls . Referrals and appointments  In addition, I have reviewed and discussed with patient certain preventive protocols, quality metrics, and best practice recommendations. A written personalized care plan for preventive services as well as general preventive health recommendations were provided to patient.     Alphia Moh, RN  03/05/2019

## 2019-03-06 ENCOUNTER — Other Ambulatory Visit: Payer: Self-pay

## 2019-03-06 ENCOUNTER — Ambulatory Visit (INDEPENDENT_AMBULATORY_CARE_PROVIDER_SITE_OTHER): Payer: Medicare Other

## 2019-03-06 ENCOUNTER — Ambulatory Visit (INDEPENDENT_AMBULATORY_CARE_PROVIDER_SITE_OTHER): Payer: Medicare Other | Admitting: Family Medicine

## 2019-03-06 ENCOUNTER — Ambulatory Visit: Payer: Medicare Other

## 2019-03-06 ENCOUNTER — Encounter: Payer: Self-pay | Admitting: Family Medicine

## 2019-03-06 ENCOUNTER — Telehealth: Payer: Self-pay

## 2019-03-06 VITALS — BP 128/80 | HR 77 | Temp 97.6°F | Ht 61.25 in | Wt 253.1 lb

## 2019-03-06 VITALS — BP 128/80 | HR 77 | Temp 97.6°F | Resp 14 | Ht 61.25 in | Wt 253.0 lb

## 2019-03-06 DIAGNOSIS — Z13820 Encounter for screening for osteoporosis: Secondary | ICD-10-CM

## 2019-03-06 DIAGNOSIS — E1159 Type 2 diabetes mellitus with other circulatory complications: Secondary | ICD-10-CM

## 2019-03-06 DIAGNOSIS — I152 Hypertension secondary to endocrine disorders: Secondary | ICD-10-CM

## 2019-03-06 DIAGNOSIS — E039 Hypothyroidism, unspecified: Secondary | ICD-10-CM

## 2019-03-06 DIAGNOSIS — E538 Deficiency of other specified B group vitamins: Secondary | ICD-10-CM

## 2019-03-06 DIAGNOSIS — E1169 Type 2 diabetes mellitus with other specified complication: Secondary | ICD-10-CM | POA: Diagnosis not present

## 2019-03-06 DIAGNOSIS — Z0000 Encounter for general adult medical examination without abnormal findings: Secondary | ICD-10-CM

## 2019-03-06 DIAGNOSIS — F330 Major depressive disorder, recurrent, mild: Secondary | ICD-10-CM

## 2019-03-06 DIAGNOSIS — Z794 Long term (current) use of insulin: Secondary | ICD-10-CM | POA: Diagnosis not present

## 2019-03-06 DIAGNOSIS — E785 Hyperlipidemia, unspecified: Secondary | ICD-10-CM

## 2019-03-06 DIAGNOSIS — I10 Essential (primary) hypertension: Secondary | ICD-10-CM | POA: Diagnosis not present

## 2019-03-06 LAB — BASIC METABOLIC PANEL
BUN: 22 mg/dL (ref 6–23)
CO2: 28 mEq/L (ref 19–32)
Calcium: 9.7 mg/dL (ref 8.4–10.5)
Chloride: 102 mEq/L (ref 96–112)
Creatinine, Ser: 0.9 mg/dL (ref 0.40–1.20)
GFR: 73.09 mL/min (ref >60.00)
Glucose, Bld: 76 mg/dL (ref 70–99)
Potassium: 4.6 mEq/L (ref 3.5–5.1)
Sodium: 138 mEq/L (ref 135–145)

## 2019-03-06 LAB — LIPID PANEL
Cholesterol: 131 mg/dL (ref 0–200)
HDL: 65.5 mg/dL (ref >39.00)
LDL Cholesterol: 51 mg/dL (ref 0–99)
NonHDL: 65.75
TRIGLYCERIDES: 72 mg/dL (ref 0.0–149.0)
Total CHOL/HDL Ratio: 2
VLDL: 14.4 mg/dL (ref 0.0–40.0)

## 2019-03-06 LAB — CBC
HCT: 37.7 % (ref 36.0–46.0)
HEMOGLOBIN: 12.5 g/dL (ref 12.0–15.0)
MCHC: 33.2 g/dL (ref 30.0–36.0)
MCV: 85.8 fl (ref 78.0–100.0)
Platelets: 298 10*3/uL (ref 150.0–400.0)
RBC: 4.39 Mil/uL (ref 3.87–5.11)
RDW: 15.5 % (ref 11.5–15.5)
WBC: 6.4 10*3/uL (ref 4.0–10.5)

## 2019-03-06 LAB — HEMOGLOBIN A1C: Hgb A1c MFr Bld: 8.5 % — ABNORMAL HIGH (ref 4.6–6.5)

## 2019-03-06 LAB — VITAMIN B12: Vitamin B-12: 658 pg/mL (ref 211–911)

## 2019-03-06 NOTE — Telephone Encounter (Signed)
During awv, pt. expressed concern about muscle cramps in legs bilaterally, at night only. Pt. stated she had not made Dr. Maudie Mercury aware. Author stated she would notify PCP and let pt. know if there were any further questions or recommendations.

## 2019-03-06 NOTE — Progress Notes (Signed)
Subjective:   Katie Mcintyre is a 78 y.o. female who presents for Medicare Annual (Subsequent) preventive examination.  Review of Systems:  No ROS.  Medicare Wellness Visit. Additional risk factors are reflected in the social history.  Cardiac Risk Factors include: advanced age (>41men, >34 women);hypertension;dyslipidemia;diabetes mellitus;obesity (BMI >30kg/m2);sedentary lifestyle Sleep patterns: has interrupted sleep and gets up 4-5 times nightly to void. Pt. States, however, that she does not feel like she needs anything to help her sleep, states it does not affect her quality of life.    Home Safety/Smoke Alarms: Feels safe in home. Smoke alarms in place.  Living environment; residence and Firearm Safety: 2-story house, equipment: Radio producer, Type: [place]. Seat Belt Safety/Bike Helmet: Wears seat belt.   Female:   Pap- N/A d/t age      3- 2019 per pt. Report. Due 2020. Pt. States she would follow-up with imaging center regarding exact date.       Dexa scan- none on file. Ordered        CCS- 2019 per pt., no follow-up needed     Objective:     Vitals: BP 128/80   Pulse 77   Temp 97.6 F (36.4 C)   Resp 14   Ht 5' 1.25" (1.556 m)   Wt 253 lb (114.8 kg)   SpO2 98%   BMI 47.41 kg/m   Body mass index is 47.41 kg/m.  Advanced Directives 03/06/2019 09/24/2018 11/06/2017 04/22/2017 11/07/2016 10/05/2016 02/15/2016  Does Patient Have a Medical Advance Directive? No No No No No No No  Would patient like information on creating a medical advance directive? Yes (MAU/Ambulatory/Procedural Areas - Information given) Yes (Inpatient - patient defers creating a medical advance directive at this time) - - Yes - Educational materials given - No - patient declined information    Tobacco Social History   Tobacco Use  Smoking Status Former Smoker  . Last attempt to quit: 12/25/1964  . Years since quitting: 54.2  Smokeless Tobacco Never Used  Tobacco Comment   smoked 6 years /  when she was in college      Counseling given: Not Answered Comment: smoked 6 years / when she was in college    Past Medical History:  Diagnosis Date  . Anemia   . Arthritis   . Cancer ([place]) 1969   , Status post complete hysterectomy per patient  . Cataract    removed both eyes with lens implant both   . Chicken pox   . Chronic bronchitis ([place])   . Colon polyp   . Depression 06/30/2013  . Diabetes mellitus without complication ([place])   . Diverticulitis   . Frequent headaches   . GERD (gastroesophageal reflux disease)   . Gout 06/30/2013  . Hx of dizziness   . Hyperlipidemia   . Hypertension   . Post-operative nausea and vomiting   . S/P epidural steroid injection 09/27/2016   done in back at [place]   . Spinal stenosis of lumbar region    s/p spine surgery 02/12/13 with Dr Marcos Eke at [facility]  . Thyroid disease   . Urine incontinence   . Use of cane as ambulatory aid    Past Surgical History:  Procedure Laterality Date  . ABDOMINAL HYSTERECTOMY  1970   complete hysterectomy for cervical cancer  . BACK SURGERY  01/2013  . BACK SURGERY  1995  . CATARACT EXTRACTION, BILATERAL     with lens implant   . CERVICAL CONE BIOPSY  1970  .  COLONOSCOPY    . EP IMPLANTABLE DEVICE N/A 02/15/2016   Procedure: Loop Recorder Insertion;  Surgeon: Sanda Klein, MD;  Location: [facility];  Service: Cardiovascular;  Laterality: N/A;  . POLYPECTOMY    . WRIST SURGERY  2010   for wrist fracture   Family History  Problem Relation Age of Onset  . Alcohol abuse Father   . Cirrhosis Father   . Heart attack Brother   . Breast cancer Sister        Reports all family members with breast cancer have had genetic testing that was negative  . Cancer Mother        renal cancer  . Stroke Paternal Grandmother   . Alcohol abuse Brother   . Breast cancer Sister        renal cancer  . Diabetes Brother   . Heart Problems Daughter   . Healthy Daughter   . Healthy Daughter   . Healthy Son   .  Healthy Son   . Arthritis Other        parents  . Hyperlipidemia Other        parent  . Hypertension Other        parent/grandparent  . Diabetes Other        parent/grandparent  . Heart disease Other   . Mental illness Other   . Colon cancer Neg Hx   . Esophageal cancer Neg Hx   . Prostate cancer Neg Hx   . Rectal cancer Neg Hx   . Seizures Neg Hx   . Colon polyps Neg Hx    Social History   Socioeconomic History  . Marital status: Divorced    Spouse name: Not on file  . Number of children: 5  . Years of education: 31  . Highest education level: Not on file  Occupational History  . 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 Financial resource strain: Not hard at all  . Food insecurity:    Worry: Never true    Inability: Never true  . Transportation needs:    Medical: No    Non-medical: No  Tobacco Use  . Smoking status: Former Smoker    Last attempt to quit: 12/25/1964    Years since quitting: 54.2  . Smokeless tobacco: Never Used  . Tobacco comment: smoked 6 years / when she was in college   Substance and Sexual Activity  . Alcohol use: Yes    Comment: occ with steak dinner   . Drug use: No  . Sexual activity: Not on file  Lifestyle  . Physical activity:    Days per week: 0 days    Minutes per session: 0 min  . Stress: Only a little  Relationships  . Social connections:    Talks on phone: Once a week    Gets together: Once a week    Attends religious service: Not on file    Active member of club or organization: No    Attends meetings of clubs or organizations: Never    Relationship status: Divorced  Other Topics Concern  . Not on file  Social History Narrative   Work or School: retired Chief Financial Officer principal      Spiritual Beliefs: Christian      Lifestyle: exercising on regular visit; trying to work on diet      Epworth Sleepiness Scale = 20 (as of 02/02/2016)      03/06/2019:   Lives alone on 2 level  house, but able to stay  on main level   Oldest of 8 children, only 4 surviving   Has 5 grown children   Pt. moved to [place] to live with son, but son has relocated, and pt. Is looking to move in with daughter in [place] within the year   Enjoys doing crosswords, playing solitaire   Not very physically active d/t back pain, but hopes to return to doing stationary bicycle       Outpatient Encounter Medications as of 03/06/2019  Medication Sig  . ACCU-CHEK AVIVA PLUS test strip USE AS INSTRUCTED TO CHECK BLOOD SUGAR TWICE A DAY * E11.9*  . ACCU-CHEK FASTCLIX LANCETS MISC USE AS DIRECTED  . acetaminophen (TYLENOL) 500 MG tablet Take 1,000 mg by mouth every 8 (eight) hours as needed.  Marland Kitchen amLODipine (NORVASC) 5 MG tablet TAKE 1 TABLET BY MOUTH ONCE DAILY  . atorvastatin (LIPITOR) 40 MG tablet Take 40 mg by mouth daily.  . B-D UF III MINI PEN NEEDLES 31G X 5 MM MISC USE TO INJECT INSULIN TWICE A DAY  . Blood Glucose Calibration (ACCU-CHEK AVIVA) SOLN Use as directed.  . calcium-vitamin D (OSCAL 500/200 D-3) 500-200 MG-UNIT per tablet Take 1 tablet by mouth daily.  . cloNIDine (CATAPRES) 0.1 MG tablet TAKE 1 TABLET BY MOUTH TWICE A DAY  . diltiazem (DILACOR XR) 120 MG 24 hr capsule Take 1 capsule (120 mg total) by mouth daily.  . ferrous sulfate 325 (65 FE) MG tablet Take 325 mg by mouth daily with breakfast.  . gabapentin (NEURONTIN) 100 MG capsule TAKE 1 CAPSULE (100 MG TOTAL) BY MOUTH AT BEDTIME.  . hydrALAZINE (APRESOLINE) 50 MG tablet TAKE 1 TABLET BY MOUTH THREE TIMES A DAY  . Insulin Lispro Prot & Lispro (HUMALOG 75/25 MIX) (75-25) 100 UNIT/ML Kwikpen Inject 16 Units into the skin 2 (two) times daily before a meal.  . irbesartan (AVAPRO) 75 MG tablet Take 2 tablets (150 mg total) by mouth daily.  . isosorbide dinitrate (ISORDIL) 20 MG tablet TAKE 1 TABLET (20 MG TOTAL) BY MOUTH 2 (TWO) TIMES DAILY.  Marland Kitchen Lancet Devices (ACCU-CHEK SOFTCLIX) lancets Use as instructed twice a day  . levothyroxine (SYNTHROID, LEVOTHROID)  125 MCG tablet TAKE 1 TABLET BY MOUTH EVERY DAY  . metFORMIN (GLUCOPHAGE) 1000 MG tablet TAKE 1 TABLET BY MOUTH TWICE DAILY WITH MEALS  . metFORMIN (GLUCOPHAGE) 1000 MG tablet TAKE 1 TABLET BY MOUTH TWICE DAILY WITH MEALS  . Sennosides-Docusate Sodium (STOOL SOFTENER & LAXATIVE PO) Take 1 tablet by mouth daily as needed (constipation).   . vitamin B-12 (CYANOCOBALAMIN) 1000 MCG tablet Take by mouth.  . [DISCONTINUED] vitamin B-12 (CYANOCOBALAMIN) 1000 MCG tablet Take 1,000 mcg by mouth daily.  . [DISCONTINUED] atorvastatin (LIPITOR) 40 MG tablet Take 1 tablet (40 mg total) by mouth daily.   Facility-Administered Encounter Medications as of 03/06/2019  Medication  . 0.9 %  sodium chloride infusion  . ipratropium-albuterol (DUONEB) 0.5-2.5 (3) MG/3ML nebulizer solution 3 mL    Activities of Daily Living In your present state of health, do you have any difficulty performing the following activities: 03/06/2019  Hearing? N  Vision? N  Difficulty concentrating or making decisions? N  Walking or climbing stairs? Y  Dressing or bathing? Y  Comment d/t pain  Doing errands, shopping? N  Preparing Food and eating ? N  Using the Toilet? N  In the past six months, have you accidently leaked urine? Y  Comment reviewed kegel exercises  Do you have  problems with loss of bowel control? N  Managing your Medications? N  Managing your Finances? N  Housekeeping or managing your Housekeeping? Y  Comment housekeeping resources provided  Some recent data might be hidden    Patient Care Team: [name], DO as PCP - General (Family Medicine) [name], DO (Family Medicine) Jola Schmidt, MD as Consulting Physician (Ophthalmology) [place] Jessy Oto, MD as Consulting Physician (Orthopedic Surgery) Jalene Mullet, MD as Consulting Physician (Ophthalmology)    Assessment:   This is a routine wellness examination for Katie Mcintyre. Physical assessment deferred to PCP.   Exercise  Activities and Dietary recommendations Current Exercise Habits: The patient does not participate in regular exercise at present, Exercise limited by: cardiac condition(s);orthopedic condition(s);psychological condition(s). Pt. States she used to go on stationary bicycle, but has not returned to the gym to do that because she thinks she became ill with the flu last year from exposure at the gym. Author suggested looking into getting a bike for her home, and pt. Seemed disinterested. Diet (meal preparation, eat out, water intake, caffeinated beverages, dairy products, fruits and vegetables): in general, a varied diet, high in fat however. Pt. states she eats a lot of sweets and carbohydrates. Chief Strategy Officer discussed ways in which to limit these with drinking more water, and portion control, and pt. stated she knew what she had to do, but could not identify removable barriers to doing those things.      Goals    . Exercise 150 minutes per week (moderate activity)     [facility]  Horizontal bike     . Patient Stated     Lose some weight before June family reunion, and get back pain under control!    . Weight (lb) < 200 lb (90.7 kg)     Lose 10 lbs Look for [employer]  Go to the gym more  3 times  A wqeek       Fall Risk Fall Risk  03/06/2019 03/06/2019 11/06/2017 11/07/2016 11/07/2016  Falls in the past year? 0 0 No No No  Number falls in past yr: - 0 - - -  Injury with Fall? - 0 - - -  Risk for fall due to : Impaired balance/gait;Impaired vision;Impaired mobility;Medication side effect - - - -  Follow up Falls prevention discussed - - - -     Depression Screen PHQ 2/9 Scores 03/06/2019 11/12/2018 02/07/2018 11/06/2017  PHQ - 2 Score 0 0 0 0  PHQ- 9 Score 12 6 12  -    Pt. declined counseling services or any intervention that would assist with her sleeping or over-eating per pt. Report.  Cognitive Function MMSE - Mini Mental State Exam 11/06/2017  11/07/2016  Not completed: (No Data) (No Data)       Ad8 score reviewed for issues:  Issues making decisions: no  Less interest in hobbies / activities: no  Repeats questions, stories (family complaining): no  Trouble using ordinary gadgets (microwave, computer, phone):no  Forgets the month or year: no  Mismanaging finances: no  Remembering appts: no  Daily problems with thinking and/or memory: yes Ad8 score is= 1    Immunization History  Administered Date(s) Administered  . Influenza, High Dose Seasonal PF 09/27/2015, 11/07/2016, 08/21/2017, 11/12/2018  . Influenza,inj,Quad PF,6+ Mos 08/21/2013, 09/11/2014  . Pneumococcal Conjugate-13 03/01/2015  . Pneumococcal Polysaccharide-23 06/30/2013  . Tdap 06/30/2013    Qualifies for Shingles  Vaccine? Yes, advised to receive at local pharmacy.  Screening Tests Health Maintenance  Topic Date Due  . OPHTHALMOLOGY EXAM  09/06/2019 (Originally 01/10/2019)  . DEXA SCAN  02/28/2025 (Originally 03/28/2005)  . HEMOGLOBIN A1C  05/13/2019  . FOOT EXAM  11/13/2019  . TETANUS/TDAP  07/01/2023  . INFLUENZA VACCINE  Completed        Plan:    Bring a copy of your living will and/or healthcare power of attorney to your next office visit.  Consider getting shingles vaccine (shingrix) at your local pharmacy.  Bone density scan ordered; GI breast center will call you to schedule appointment at your convenience.  Mammogram due this year; follow-up with imaging center where you had it done to confirm due date.  Continue doing brain stimulating activities (puzzles, reading, adult coloring books, staying active) to keep memory sharp.   Consider drinking more water, limiting simple sugars to help you lose weight.  Refer to housekeeping resources provided.  Try to do kegels daily to help with urinary incontinence.  Will let Dr. Maudie Mercury know about muscle cramps at night.  Good luck with the orthopedist, and let us know if you need  anything before your planned move to [facility]! I have personally reviewed and noted the following in the patient's chart:   . Medical and social history . Use of alcohol, tobacco or illicit drugs  . Current medications and supplements . Functional ability and status . Nutritional status . Physical activity . Advanced directives . List of other physicians . Vitals . Screenings to include cognitive, depression, and falls . Referrals and appointments  In addition, I have reviewed and discussed with patient certain preventive protocols, quality metrics, and best practice recommendations. A written personalized care plan for preventive services as well as general preventive health recommendations were provided to patient.     Alphia Moh, RN  03/06/2019

## 2019-03-06 NOTE — Patient Instructions (Signed)
Bring a copy of your living will and/or healthcare power of attorney to your next office visit.  Consider getting shingles vaccine (shingrix) at your local pharmacy.  Bone density scan ordered; GI breast center will call you to schedule appointment at your convenience.  Mammogram due this year; follow-up with imaging center where you had it done to confirm due date.  Continue doing brain stimulating activities (puzzles, reading, adult coloring books, staying active) to keep memory sharp.   Consider drinking more water, limiting simple sugars to help you lose weight.  Refer to housekeeping resources provided.  Try to do kegels daily to help with urinary incontinence.  Will let Dr. Maudie Mercury know about muscle cramps at night.  Good luck with the orthopedist, and let us know if you need anything before your planned move to [facility]!   Ms. Cerino , Thank you for taking time to come for your Medicare Wellness Visit. I appreciate your ongoing commitment to your health goals. Please review the following plan we discussed and let me know if I can assist you in the future.   These are the goals we discussed: Goals    . Exercise 150 minutes per week (moderate activity)     [facility] and [facility] to walk 2 laps  Horizontal bike     . Patient Stated     Lose some weight before June family reunion, and get back pain under control!    . Weight (lb) < 200 lb (90.7 kg)     Lose 10 lbs Look for [employer]  Go to the gym more  3 times  A wqeek       This is a list of the screening recommended for you and due dates:  Health Maintenance  Topic Date Due  . Eye exam for diabetics  09/06/2019*  . DEXA scan (bone density measurement)  02/28/2025*  . Hemoglobin A1C  05/13/2019  . Complete foot exam   11/13/2019  . Tetanus Vaccine  07/01/2023  . Flu Shot  Completed  *Topic was postponed. The date shown is not the original due date.   Zoster Vaccine, Recombinant injection  What is this medicine? ZOSTER VACCINE (ZOS ter vak SEEN) is used to prevent shingles in adults 79 years old and over. This vaccine is not used to treat shingles or nerve pain from shingles. This medicine may be used for other purposes; ask your health care provider or pharmacist if you have questions. COMMON BRAND NAME(S): [facility] What should I tell my health care provider before I take this medicine? They need to know if you have any of these conditions: -blood disorders or disease -cancer like leukemia or lymphoma -immune system problems or therapy -an unusual or allergic reaction to vaccines, other medications, foods, dyes, or preservatives -pregnant or trying to get pregnant -breast-feeding How should I use this medicine? This vaccine is for injection in a muscle. It is given by a health care professional. Talk to your pediatrician regarding the use of this medicine in children. This medicine is not approved for use in children. Overdosage: If you think you have taken too much of this medicine contact a poison control center or emergency room at once. NOTE: This medicine is only for you. Do not share this medicine with others. What if I miss a dose? Keep appointments for follow-up (booster) doses as directed. It is important not to miss your dose. Call your doctor or health care professional if you are  unable to keep an appointment. What may interact with this medicine? -medicines that suppress your immune system -medicines to treat cancer -steroid medicines like prednisone or cortisone This list may not describe all possible interactions. Give your health care provider a list of all the medicines, herbs, non-prescription drugs, or dietary supplements you use. Also tell them if you smoke, drink alcohol, or use illegal drugs. Some items may interact with your medicine. What should I watch for while using this medicine? Visit your doctor for regular check ups. This vaccine, like all  vaccines, may not fully protect everyone. What side effects may I notice from receiving this medicine? Side effects that you should report to your doctor or health care professional as soon as possible: -allergic reactions like skin rash, itching or hives, swelling of the face, lips, or tongue -breathing problems Side effects that usually do not require medical attention (report these to your doctor or health care professional if they continue or are bothersome): -chills -headache -fever -nausea, vomiting -redness, warmth, pain, swelling or itching at site where injected -tiredness This list may not describe all possible side effects. Call your doctor for medical advice about side effects. You may report side effects to FDA at 1-800-FDA-1088. Where should I keep my medicine? This vaccine is only given in a clinic, pharmacy, doctor's office, or other health care setting and will not be stored at home. NOTE: This sheet is a summary. It may not cover all possible information. If you have questions about this medicine, talk to your doctor, pharmacist, or health care provider.  2019 Elsevier/Gold Standard (2017-07-23 13:20:30)  Kegel Exercises Kegel exercises help strengthen the muscles that support the rectum, vagina, small intestine, bladder, and uterus. Doing Kegel exercises can help:  Improve bladder and bowel control.  Improve sexual response.  Reduce problems and discomfort during pregnancy. Kegel exercises involve squeezing your pelvic floor muscles, which are the same muscles you squeeze when you try to stop the flow of urine. The exercises can be done while sitting, standing, or lying down, but it is best to vary your position. Exercises 1. Squeeze your pelvic floor muscles tight. You should feel a tight lift in your rectal area. If you are a female, you should also feel a tightness in your vaginal area. Keep your stomach, buttocks, and legs relaxed. 2. Hold the muscles tight for up  to 10 seconds. 3. Relax your muscles. Repeat this exercise 50 times a day or as many times as told by your health care provider. Continue to do this exercise for at least 4-6 weeks or for as long as told by your health care provider. This information is not intended to replace advice given to you by your health care provider. Make sure you discuss any questions you have with your health care provider. Document Released: 11/27/2012 Document Revised: 04/23/2017 Document Reviewed: 10/31/2015 Elsevier Interactive Patient Education  2019 [employer].

## 2019-03-06 NOTE — Telephone Encounter (Signed)
Pt did not answer at 2 phones. LM for pt to call back.

## 2019-03-06 NOTE — Patient Instructions (Signed)
Please schedule a 30 minute Transfer of Care visit with Dr. Ethlyn Gallery in 3 months  We have ordered labs or studies at this visit. It can take up to 1-2 weeks for results and processing. IF results require follow up or explanation, we will call you with instructions. Clinically stable results will be released to your [facility]. If you have not heard from [place] or cannot find your results in [facility] in 2 weeks please contact our office at [phone].  If you are not yet signed up for [facility], please consider signing up.   Preventive Care 79 Years and Older, Female Preventive care refers to lifestyle choices and visits with your health care provider that can promote health and wellness. What does preventive care include?  A yearly physical exam. This is also called an annual well check.  Dental exams once or twice a year.  Routine eye exams. Ask your health care provider how often you should have your eyes checked.  Personal lifestyle choices, including: ? Daily care of your teeth and gums. ? Regular physical activity. ? Eating a healthy diet. ? Avoiding tobacco and drug use. ? Limiting alcohol use. ? Practicing safe sex. ? Taking vitamin and mineral supplements as recommended by your health care provider. What happens during an annual well check? The services and screenings done by your health care provider during your annual well check will depend on your age, overall health, lifestyle risk factors, and family history of disease. Counseling Your health care provider may ask you questions about your:  Alcohol use.  Tobacco use.  Drug use.  Emotional well-being.  Home and relationship well-being.  Sexual activity.  Eating habits.  History of falls.  Memory and ability to understand (cognition).  Work and work Statistician.  Reproductive health.  Screening You may have the following tests or measurements:  Height, weight, and BMI.  Blood pressure.  Lipid and  cholesterol levels. These may be checked every 5 years, or more frequently if you are over 75 years old.  Skin check.  Lung cancer screening. You may have this screening every year starting at age 30 if you have a 30-pack-year history of smoking and currently smoke or have quit within the past 15 years.  Colorectal cancer screening. All adults should have this screening starting at age 57 and continuing until age 17. You will have tests every 1-10 years, depending on your results and the type of screening test. People at increased risk should start screening at an earlier age. Screening tests may include: ? Guaiac-based fecal occult blood testing. ? Fecal immunochemical test (FIT). ? Stool DNA test. ? Virtual colonoscopy. ? Sigmoidoscopy. During this test, a flexible tube with a tiny camera (sigmoidoscope) is used to examine your rectum and lower colon. The sigmoidoscope is inserted through your anus into your rectum and lower colon. ? Colonoscopy. During this test, a long, thin, flexible tube with a tiny camera (colonoscope) is used to examine your entire colon and rectum.  Hepatitis C blood test.  Hepatitis B blood test.  Sexually transmitted disease (STD) testing.  Diabetes screening. This is done by checking your blood sugar (glucose) after you have not eaten for a while (fasting). You may have this done every 1-3 years.  Bone density scan. This is done to screen for osteoporosis. You may have this done starting at age 41.  Mammogram. This may be done every 1-2 years. Talk to your health care provider about how often you should have regular mammograms. Talk  with your health care provider about your test results, treatment options, and if necessary, the need for more tests. Vaccines Your health care provider may recommend certain vaccines, such as:  Influenza vaccine. This is recommended every year.  Tetanus, diphtheria, and acellular pertussis (Tdap, Td) vaccine. You may need a Td  booster every 10 years.  Varicella vaccine. You may need this if you have not been vaccinated.  Zoster vaccine. You may need this after age 41.  Measles, mumps, and rubella (MMR) vaccine. You may need at least one dose of MMR if you were born in 1957 or later. You may also need a second dose.  Pneumococcal 13-valent conjugate (PCV13) vaccine. One dose is recommended after age 66.  Pneumococcal polysaccharide (PPSV23) vaccine. One dose is recommended after age 5.  Meningococcal vaccine. You may need this if you have certain conditions.  Hepatitis A vaccine. You may need this if you have certain conditions or if you travel or work in places where you may be exposed to hepatitis A.  Hepatitis B vaccine. You may need this if you have certain conditions or if you travel or work in places where you may be exposed to hepatitis B.  Haemophilus influenzae type b (Hib) vaccine. You may need this if you have certain conditions. Talk to your health care provider about which screenings and vaccines you need and how often you need them. This information is not intended to replace advice given to you by your health care provider. Make sure you discuss any questions you have with your health care provider. Document Released: 01/07/2016 Document Revised: 01/31/2018 Document Reviewed: 10/12/2015 Elsevier Interactive Patient Education  2019 [employer].

## 2019-03-06 NOTE — Progress Notes (Addendum)
HPI:  Using dictation device. Unfortunately this device frequently misinterprets words/phrases.  Here for CPE:  -Concerns and/or follow up today:  Chronic medical problems summarized below were reviewed for changes. Reports fairly stable. Is seeing ortho and back specialist and had steroid injection in February. Home blood sugars in 100-150 range. No low blood sugars.  Diabetes: -insulin dependent -meds: metformin, humolog mix 75/3513-14units -podiatrist: Dr. Amalia Hailey, [name] -opthomologist:  -on arb  Morbid obesity: -poor diet, no exercise -felt best when she was getting regular exercise -caregiver for many in her family -no exercise, poor diet 10/2018  Hypothyroidism: -meds: synthroid  Hx syncope, presyncope, bradycardia, PAF, HTN, HLD, Dyspnea, Chronic LE edema: -sees cardiology and neurology -meds: norvasc, clonidine, hydralazine, ibesartan, diltiazem, isordil, simvastatin -s/p implantable loop recorder -echo 1610: grade 1 diastolic dysfunction -cardiology ordered sleep study -she saw neurology in 2017 with extensive eval for the syncope/AMS w/ EEG and MRI -PAF dx in 2019, cardiology advised against [place]  Chronic back pain: -hx surgery at [facility] with Dr. Marcos Eke -has had injections  Mild recurrent depression: -no medications  Hx iron def aneima: -chronic, takes iron chronically from prior PCP -hx colon polyps -last colonosocpy 02/2018 -also b12 def, on oral supplementation  -Diet: variety of foods, balance and well rounded, larger portion sizes -Exercise: no regular exercise -Taking folic acid, vitamin D or calcium: no -Diabetes and Dyslipidemia Screening: fasting x 4 hours -Vaccines: see vaccine section EPIC -wants STI testing (Hep C if born [birth year]): no -FH breast, colon or ovarian ca: see FH Last mammogram: will discuss withhealth coach Last colon cancer screening: had colonoscopy in 2019 per chart and GI advise repeat at 79 if patient  wished Breast Ca Risk Assessment: see family history and pt history DEXA (>/= 31): will discuss with health coach  -Alcohol, Tobacco, drug use: see social history  Review of Systems - no fevers, unintentional weight loss, vision loss, hearing loss, chest pain, sob, hemoptysis, melena, hematochezia, hematuria, genital discharge, changing or concerning skin lesions, bleeding, bruising, loc, thoughts of self harm or SI  Past Medical History:  Diagnosis Date  . Anemia   . Arthritis   . Cancer ([place]) 1969   , Status post complete hysterectomy per patient  . Cataract    removed both eyes with lens implant both   . Chicken pox   . Chronic bronchitis ([place])   . Colon polyp   . Depression 06/30/2013  . Diabetes mellitus without complication ([place])   . Diverticulitis   . Frequent headaches   . GERD (gastroesophageal reflux disease)   . Gout 06/30/2013  . Hx of dizziness   . Hyperlipidemia   . Hypertension   . Post-operative nausea and vomiting   . S/P epidural steroid injection 09/27/2016   done in back at [place]   . Spinal stenosis of lumbar region    s/p spine surgery 02/12/13 with Dr Marcos Eke at [facility]  . Thyroid disease   . Urine incontinence   . Use of cane as ambulatory aid     Past Surgical History:  Procedure Laterality Date  . ABDOMINAL HYSTERECTOMY  1970   complete hysterectomy for cervical cancer  . BACK SURGERY  01/2013  . BACK SURGERY  1995  . CATARACT EXTRACTION, BILATERAL     with lens implant   . CERVICAL CONE BIOPSY  1970  . COLONOSCOPY    . EP IMPLANTABLE DEVICE N/A 02/15/2016   Procedure: Loop Recorder Insertion;  Surgeon: Sanda Klein, MD;  Location: [facility];  Service: Cardiovascular;  Laterality: N/A;  . POLYPECTOMY    . WRIST SURGERY  2010   for wrist fracture    Family History  Problem Relation Age of Onset  . Alcohol abuse Father   . Cirrhosis Father   . Heart attack Brother   . Breast cancer Sister        Reports all family members with  breast cancer have had genetic testing that was negative  . Cancer Mother        renal cancer  . Stroke Paternal Grandmother   . Alcohol abuse Brother   . Breast cancer Sister        renal cancer  . Diabetes Brother   . Heart Problems Daughter   . Healthy Daughter   . Healthy Daughter   . Healthy Son   . Healthy Son   . Arthritis Other        parents  . Hyperlipidemia Other        parent  . Hypertension Other        parent/grandparent  . Diabetes Other        parent/grandparent  . Heart disease Other   . Mental illness Other   . Colon cancer Neg Hx   . Esophageal cancer Neg Hx   . Prostate cancer Neg Hx   . Rectal cancer Neg Hx   . Seizures Neg Hx   . Colon polyps Neg Hx     Social History   Socioeconomic History  . Marital status: Divorced    Spouse name: Not on file  . Number of children: 5  . Years of education: 70  . Highest education level: Not on file  Occupational History  . 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 Financial resource strain: Not hard at all  . Food insecurity:    Worry: Never true    Inability: Never true  . Transportation needs:    Medical: No    Non-medical: No  Tobacco Use  . Smoking status: Former Smoker    Last attempt to quit: 12/25/1964    Years since quitting: 54.2  . Smokeless tobacco: Never Used  . Tobacco comment: smoked 6 years / when she was in college   Substance and Sexual Activity  . Alcohol use: Yes    Comment: occ with steak dinner   . Drug use: No  . Sexual activity: Not on file  Lifestyle  . Physical activity:    Days per week: Not on file    Minutes per session: Not on file  . Stress: Not on file  Relationships  . Social connections:    Talks on phone: Not on file    Gets together: Not on file    Attends religious service: Not on file    Active member of club or organization: Not on file    Attends meetings of clubs or organizations: Not on file    Relationship status: Not on  file  Other Topics Concern  . Not on file  Social History Narrative   Work or School: retired Nurse, children's Situation: lives with son in [place]: exercising on regular visit; trying to work on diet            Epworth Sleepiness Scale = 20 (as of 02/02/2016)  Current Outpatient Medications:  .  ACCU-CHEK AVIVA PLUS test strip, USE AS INSTRUCTED TO CHECK BLOOD SUGAR TWICE A DAY * E11.9*, Disp: 100 each, Rfl: 5 .  ACCU-CHEK FASTCLIX LANCETS MISC, USE AS DIRECTED, Disp: 102 each, Rfl: 5 .  acetaminophen (TYLENOL) 500 MG tablet, Take 1,000 mg by mouth every 8 (eight) hours as needed., Disp: , Rfl:  .  amLODipine (NORVASC) 5 MG tablet, TAKE 1 TABLET BY MOUTH ONCE DAILY, Disp: 90 tablet, Rfl: 1 .  B-D UF III MINI PEN NEEDLES 31G X 5 MM MISC, USE TO INJECT INSULIN TWICE A DAY, Disp: 100 each, Rfl: 3 .  Blood Glucose Calibration (ACCU-CHEK AVIVA) SOLN, Use as directed., Disp: 1 each, Rfl: 1 .  calcium-vitamin D (OSCAL 500/200 D-3) 500-200 MG-UNIT per tablet, Take 1 tablet by mouth daily., Disp: , Rfl:  .  cloNIDine (CATAPRES) 0.1 MG tablet, TAKE 1 TABLET BY MOUTH TWICE A DAY, Disp: 180 tablet, Rfl: 1 .  diltiazem (DILACOR XR) 120 MG 24 hr capsule, Take 1 capsule (120 mg total) by mouth daily., Disp: 90 capsule, Rfl: 3 .  ferrous sulfate 325 (65 FE) MG tablet, Take 325 mg by mouth daily with breakfast., Disp: , Rfl:  .  gabapentin (NEURONTIN) 100 MG capsule, TAKE 1 CAPSULE (100 MG TOTAL) BY MOUTH AT BEDTIME., Disp: 90 capsule, Rfl: 0 .  hydrALAZINE (APRESOLINE) 50 MG tablet, TAKE 1 TABLET BY MOUTH THREE TIMES A DAY, Disp: 270 tablet, Rfl: 1 .  Insulin Lispro Prot & Lispro (HUMALOG 75/25 MIX) (75-25) 100 UNIT/ML Kwikpen, Inject 16 Units into the skin 2 (two) times daily before a meal., Disp: 15 mL, Rfl: 4 .  irbesartan (AVAPRO) 75 MG tablet, Take 2 tablets (150 mg total) by mouth daily., Disp: 180 tablet, Rfl: 1 .   isosorbide dinitrate (ISORDIL) 20 MG tablet, TAKE 1 TABLET (20 MG TOTAL) BY MOUTH 2 (TWO) TIMES DAILY., Disp: 180 tablet, Rfl: 1 .  Lancet Devices (ACCU-CHEK SOFTCLIX) lancets, Use as instructed twice a day, Disp: 1 each, Rfl: 5 .  levothyroxine (SYNTHROID, LEVOTHROID) 125 MCG tablet, TAKE 1 TABLET BY MOUTH EVERY DAY, Disp: 90 tablet, Rfl: 1 .  metFORMIN (GLUCOPHAGE) 1000 MG tablet, TAKE 1 TABLET BY MOUTH TWICE DAILY WITH MEALS, Disp: 180 tablet, Rfl: 1 .  metFORMIN (GLUCOPHAGE) 1000 MG tablet, TAKE 1 TABLET BY MOUTH TWICE DAILY WITH MEALS, Disp: 180 tablet, Rfl: 1 .  Sennosides-Docusate Sodium (STOOL SOFTENER & LAXATIVE PO), Take 1 tablet by mouth daily as needed (constipation). , Disp: , Rfl:  .  atorvastatin (LIPITOR) 40 MG tablet, Take 40 mg by mouth daily., Disp: , Rfl:  .  vitamin B-12 (CYANOCOBALAMIN) 1000 MCG tablet, Take by mouth., Disp: , Rfl:   Current Facility-Administered Medications:  .  0.9 %  sodium chloride infusion, 500 mL, Intravenous, Once, Pyrtle, Lajuan Lines, MD .  ipratropium-albuterol (DUONEB) 0.5-2.5 (3) MG/3ML nebulizer solution 3 mL, 3 mL, Nebulization, Once, Kordsmeier, Gregary Signs, FNP  EXAM:  Vitals:   03/06/19 1306  BP: 128/80  Pulse: 77  Temp: 97.6 F (36.4 C)  SpO2: 98%    GENERAL: vitals reviewed and listed below, alert, oriented, appears well hydrated and in no acute distress  HEENT: head atraumatic, PERRLA, normal appearance of eyes, ears, nose and mouth. moist mucus membranes.  NECK: supple, no masses or lymphadenopathy  LUNGS: clear to auscultation bilaterally, no rales, rhonchi or wheeze  CV: HRRR, no peripheral edema or cyanosis, normal pedal pulses  ABDOMEN: bowel sounds normal, soft, non  tender to palpation, no masses, no rebound or guarding  BREAST: normal appearance - no skin lesions or discharge noted on inspection of both breasts, on palpation of both breast and axillary region no suspicious lesions appreciated today  GU: declined RECTAL:  deferred  SKIN: no rash or abnormal lesions  MS: normal gait, moves all extremities normally  NEURO: normal gait, speech and thought processing grossly intact, muscle tone grossly intact throughout  PSYCH: normal affect, pleasant and cooperative  ASSESSMENT AND PLAN:  Discussed the following assessment and plan:  PREVENTIVE EXAM: -Discussed and advised all [place] preventive services health task force level A and B recommendations for age, sex and risks. -Advised at least 150 minutes of exercise per week and a healthy diet with avoidance of (less then 1 serving per week) processed foods, white starches, red meat, fast foods and sweets and consisting of: * 5-9 servings of fresh fruits and vegetables (not corn or potatoes) *nuts and seeds, beans *olives and olive oil *lean meats such as fish and white chicken  *whole grains -labs, studies and vaccines per orders this encounter -seeing Raquel Sarna for her Wellness visit today  2. Morbid obesity ([place]) -lifestyle recs  3. Mild episode of recurrent major depressive disorder (HCC) -stable  4. Hypertension associated with diabetes ([place]) - Basic metabolic panel - CBC  5. Hyperlipidemia associated with type 2 diabetes mellitus (HCC) - Lipid panel  6. Type 2 diabetes mellitus with other circulatory complication, with long-term current use of insulin (HCC) - Hemoglobin A1c  7. Hypothyroidism, unspecified type -TSH  8. B12 deficiency - Vitamin B12   Patient advised to return to clinic immediately if symptoms worsen or persist or new concerns.  Patient Instructions  Please schedule a 30 minute Transfer of Care visit with Dr. Ethlyn Gallery in 3 months  We have ordered labs or studies at this visit. It can take up to 1-2 weeks for results and processing. IF results require follow up or explanation, we will call you with instructions. Clinically stable results will be released to your [facility]. If you have not heard from [place] or cannot find your  results in [facility] in 2 weeks please contact our office at [phone].  If you are not yet signed up for [facility], please consider signing up.   Preventive Care 23 Years and Older, Female Preventive care refers to lifestyle choices and visits with your health care provider that can promote health and wellness. What does preventive care include?  A yearly physical exam. This is also called an annual well check.  Dental exams once or twice a year.  Routine eye exams. Ask your health care provider how often you should have your eyes checked.  Personal lifestyle choices, including: ? Daily care of your teeth and gums. ? Regular physical activity. ? Eating a healthy diet. ? Avoiding tobacco and drug use. ? Limiting alcohol use. ? Practicing safe sex. ? Taking vitamin and mineral supplements as recommended by your health care provider. What happens during an annual well check? The services and screenings done by your health care provider during your annual well check will depend on your age, overall health, lifestyle risk factors, and family history of disease. Counseling Your health care provider may ask you questions about your:  Alcohol use.  Tobacco use.  Drug use.  Emotional well-being.  Home and relationship well-being.  Sexual activity.  Eating habits.  History of falls.  Memory and ability to understand (cognition).  Work and work Statistician.  Reproductive health.  Screening You may have the following tests or measurements:  Height, weight, and BMI.  Blood pressure.  Lipid and cholesterol levels. These may be checked every 5 years, or more frequently if you are over 52 years old.  Skin check.  Lung cancer screening. You may have this screening every year starting at age 52 if you have a 30-pack-year history of smoking and currently smoke or have quit within the past 15 years.  Colorectal cancer screening. All adults should have this screening starting  at age 57 and continuing until age 12. You will have tests every 1-10 years, depending on your results and the type of screening test. People at increased risk should start screening at an earlier age. Screening tests may include: ? Guaiac-based fecal occult blood testing. ? Fecal immunochemical test (FIT). ? Stool DNA test. ? Virtual colonoscopy. ? Sigmoidoscopy. During this test, a flexible tube with a tiny camera (sigmoidoscope) is used to examine your rectum and lower colon. The sigmoidoscope is inserted through your anus into your rectum and lower colon. ? Colonoscopy. During this test, a long, thin, flexible tube with a tiny camera (colonoscope) is used to examine your entire colon and rectum.  Hepatitis C blood test.  Hepatitis B blood test.  Sexually transmitted disease (STD) testing.  Diabetes screening. This is done by checking your blood sugar (glucose) after you have not eaten for a while (fasting). You may have this done every 1-3 years.  Bone density scan. This is done to screen for osteoporosis. You may have this done starting at age 52.  Mammogram. This may be done every 1-2 years. Talk to your health care provider about how often you should have regular mammograms. Talk with your health care provider about your test results, treatment options, and if necessary, the need for more tests. Vaccines Your health care provider may recommend certain vaccines, such as:  Influenza vaccine. This is recommended every year.  Tetanus, diphtheria, and acellular pertussis (Tdap, Td) vaccine. You may need a Td booster every 10 years.  Varicella vaccine. You may need this if you have not been vaccinated.  Zoster vaccine. You may need this after age 33.  Measles, mumps, and rubella (MMR) vaccine. You may need at least one dose of MMR if you were born in 1957 or later. You may also need a second dose.  Pneumococcal 13-valent conjugate (PCV13) vaccine. One dose is recommended after age  92.  Pneumococcal polysaccharide (PPSV23) vaccine. One dose is recommended after age 34.  Meningococcal vaccine. You may need this if you have certain conditions.  Hepatitis A vaccine. You may need this if you have certain conditions or if you travel or work in places where you may be exposed to hepatitis A.  Hepatitis B vaccine. You may need this if you have certain conditions or if you travel or work in places where you may be exposed to hepatitis B.  Haemophilus influenzae type b (Hib) vaccine. You may need this if you have certain conditions. Talk to your health care provider about which screenings and vaccines you need and how often you need them. This information is not intended to replace advice given to you by your health care provider. Make sure you discuss any questions you have with your health care provider. Document Released: 01/07/2016 Document Revised: 01/31/2018 Document Reviewed: 10/12/2015 Elsevier Interactive Patient Education  2019 [employer].         No follow-ups on file.  [name], DO

## 2019-03-06 NOTE — Telephone Encounter (Signed)
Patient called back for Dr Maudie Mercury

## 2019-03-07 ENCOUNTER — Other Ambulatory Visit: Payer: Self-pay

## 2019-03-07 ENCOUNTER — Ambulatory Visit (INDEPENDENT_AMBULATORY_CARE_PROVIDER_SITE_OTHER): Payer: Medicare Other | Admitting: Specialist

## 2019-03-07 ENCOUNTER — Encounter (INDEPENDENT_AMBULATORY_CARE_PROVIDER_SITE_OTHER): Payer: Self-pay | Admitting: Specialist

## 2019-03-07 VITALS — BP 181/75 | HR 67 | Ht 61.25 in | Wt 253.0 lb

## 2019-03-07 DIAGNOSIS — M7071 Other bursitis of hip, right hip: Secondary | ICD-10-CM

## 2019-03-07 MED ORDER — GABAPENTIN 100 MG PO CAPS: 100.0000 mg | ORAL_CAPSULE | Freq: Every day | ORAL | 3 refills | Status: DC

## 2019-03-07 NOTE — Progress Notes (Signed)
Office Visit Note   [name]           Date of Birth: 08/05/1941           MRN: [id number] Visit Date: 03/07/2019              Requested by: [name], DO [address] [place], [place] [address] PCP: [name], DO   Assessment & Plan: Visit Diagnoses:  1. Other bursitis of hip, right hip     Plan: Avoid frequent bending and stooping  No lifting greater than 10 lbs. May use ice or moist heat for pain. Weight loss is of benefit. Best medication for lumbar disc disease is arthritis medications like motrin, celebrex and naprosyn. Exercise is important to improve your indurance and does allow people to function better inspite of back pain.  Dr. Romona Curls secretary/Assistant will call to arrange for right ischial bursa steroid/ marcaine injection. PT for exercises to stretch right hamstring and Heat, massage and ultrasound.    Follow-Up Instructions: Return in about 4 weeks (around 04/04/2019).   Orders:  No orders of the defined types were placed in this encounter.  No orders of the defined types were placed in this encounter.     Procedures: No procedures performed   Clinical Data: No additional findings.   Subjective: Chief Complaint  Patient presents with   Lower Back - Follow-up    78 year female with back pain in the small of the back with prolong standing greater than 5-10 minutes. She notes leg cramps at night and she has to get up and she has to wait until these are relieved before she can get up. No bowel difficulty, she has to get up every 2 hours to go to the bathroom. She takes a stool softener occasionally. She saw Dr. Ernestina Patches with pain with sitting and he found ischial bursitis and performed an ischial injection and this helped rapidly and it lasted almost 2 weeks and she feels like it is returning. She is sitting more on the left side to off load pressure on the right ischial tuberosity. Her primary care has recommended therapy  and gym but she has not felt like going to therapy. Last went to the gym in 2019, has not been to the gym since the coronovirus is a concern as well and since the beginning of the new year just due to pain.    Review of Systems  Constitutional: Negative.   HENT: Negative.   Eyes: Negative.   Respiratory: Negative.   Cardiovascular: Negative.   Gastrointestinal: Negative.   Endocrine: Negative.   Genitourinary: Negative.   Musculoskeletal: Negative.   Skin: Negative.   Allergic/Immunologic: Negative.   Neurological: Negative.   Hematological: Negative.   Psychiatric/Behavioral: Negative.      Objective: Vital Signs: BP (!) 181/75 (BP Location: Left Arm, Patient Position: Sitting)    Pulse 67    Ht 5' 1.25" (1.556 m)    Wt 253 lb (114.8 kg)    BMI 47.41 kg/m   Physical Exam Constitutional:      Appearance: She is well-developed.  HENT:     Head: Normocephalic and atraumatic.  Eyes:     Pupils: Pupils are equal, round, and reactive to light.  Neck:     Musculoskeletal: Normal range of motion and neck supple.  Pulmonary:     Effort: Pulmonary effort is normal.     Breath sounds: Normal breath sounds.  Abdominal:  General: Bowel sounds are normal.     Palpations: Abdomen is soft.  Musculoskeletal: Normal range of motion.  Skin:    General: Skin is warm and dry.  Neurological:     Mental Status: She is alert and oriented to person, place, and time.  Psychiatric:        Behavior: Behavior normal.        Thought Content: Thought content normal.        Judgment: Judgment normal.     Back Exam   Tenderness  The patient is experiencing tenderness in the lumbar.  Range of Motion  Extension: normal  Flexion: normal  Lateral bend right: normal  Lateral bend left: normal  Rotation right: normal  Rotation left: normal   Muscle Strength  Right Quadriceps:  5/5  Left Quadriceps:  5/5  Right Hamstrings:  5/5  Left Hamstrings:  5/5   Tests  Straight leg raise  right: negative Straight leg raise left: negative  Reflexes  Patellar: normal Achilles: normal Biceps: normal Babinski's sign: normal   Other  Toe walk: normal Heel walk: normal Erythema: no back redness Scars: absent      Specialty Comments:  No specialty comments available.  Imaging: No results found.   PMFS History: Patient Active Problem List   Diagnosis Date Noted   PAF (paroxysmal atrial fibrillation) ([facility]) 11/12/2018   Hyperlipidemia associated with type 2 diabetes mellitus ([facility]) 11/12/2018   Hypertension associated with diabetes ([facility]) 02/28/2018   Mild episode of recurrent major depressive disorder ([facility]) 08/21/2017   Morbid obesity ([facility]) 01/15/2017   Status post placement of implantable loop recorder 08/17/2016   Syncope 02/02/2016   Bradycardia 02/02/2016   SOB (shortness of breath) 02/02/2016   Pre-syncope 02/02/2016   History of colonic polyps 01/11/2016   Type 2 diabetes mellitus with circulatory disorder, with long-term current use of insulin ([facility]) 01/11/2016   Chronic back pain - managed by Dr. Marcos Eke at [facility] 03/12/2014   Gout 06/30/2013   Hypothyroidism 06/30/2013   Iron deficiency anemia 06/30/2013   Past Medical History:  Diagnosis Date   Anemia    Arthritis    Cancer ([facility]) 1969   , Status post complete hysterectomy per patient   Cataract    removed both eyes with lens implant both    Chicken pox    Chronic bronchitis ([facility])    Colon polyp    Depression 06/30/2013   Diabetes mellitus without complication ([facility])    Diverticulitis    Frequent headaches    GERD (gastroesophageal reflux disease)    Gout 06/30/2013   Hx of dizziness    Hyperlipidemia    Hypertension    Post-operative nausea and vomiting    S/P epidural steroid injection 09/27/2016   done in back at [place]    Spinal stenosis of lumbar region    s/p spine surgery 02/12/13 with Dr Marcos Eke at [facility]   Thyroid disease    Urine incontinence      Use of cane as ambulatory aid     Family History  Problem Relation Age of Onset   Alcohol abuse Father    Cirrhosis Father    Heart attack Brother    Breast cancer Sister        Reports all family members with breast cancer have had genetic testing that was negative   Cancer Mother        renal cancer   Stroke Paternal Grandmother    Alcohol abuse Brother    Breast cancer  Sister        renal cancer   Diabetes Brother    Heart Problems Daughter    Healthy Daughter    Healthy Daughter    Healthy Son    Healthy Son    Arthritis Other        parents   Hyperlipidemia Other        parent   Hypertension Other        parent/grandparent   Diabetes Other        parent/grandparent   Heart disease Other    Mental illness Other    Colon cancer Neg Hx    Esophageal cancer Neg Hx    Prostate cancer Neg Hx    Rectal cancer Neg Hx    Seizures Neg Hx    Colon polyps Neg Hx     Past Surgical History:  Procedure Laterality Date   ABDOMINAL HYSTERECTOMY  1970   complete hysterectomy for cervical cancer   BACK SURGERY  01/2013   BACK SURGERY  1995   CATARACT EXTRACTION, BILATERAL     with lens implant    CERVICAL CONE BIOPSY  1970   COLONOSCOPY     EP IMPLANTABLE DEVICE N/A 02/15/2016   Procedure: Loop Recorder Insertion;  Surgeon: Sanda Klein, MD;  Location: [facility];  Service: Cardiovascular;  Laterality: N/A;   POLYPECTOMY     WRIST SURGERY  2010   for wrist fracture   Social History   Occupational History   Occupation: Radiation protection practitioner    Comment: retired  Tobacco Use   Smoking status: Former Smoker    Last attempt to quit: 12/25/1964    Years since quitting: 54.2   Smokeless tobacco: Never Used   Tobacco comment: smoked 6 years / when she was in college   Substance and Sexual Activity   Alcohol use: Yes    Comment: occ with steak dinner    Drug use: No   Sexual activity: Not on file

## 2019-03-07 NOTE — Patient Instructions (Signed)
Avoid frequent bending and stooping  No lifting greater than 10 lbs. May use ice or moist heat for pain. Weight loss is of benefit. Best medication for lumbar disc disease is arthritis medications like motrin, celebrex and naprosyn. Exercise is important to improve your indurance and does allow people to function better inspite of back pain.  Dr. Romona Curls secretary/Assistant will call to arrange for right ischial bursa steroid/ marcaine injection. PT for exercises to stretch right hamstring and Heat, massage and ultrasound.

## 2019-03-10 ENCOUNTER — Other Ambulatory Visit: Payer: Self-pay

## 2019-03-10 ENCOUNTER — Ambulatory Visit (INDEPENDENT_AMBULATORY_CARE_PROVIDER_SITE_OTHER): Payer: Medicare Other | Admitting: *Deleted

## 2019-03-10 DIAGNOSIS — R55 Syncope and collapse: Secondary | ICD-10-CM | POA: Diagnosis not present

## 2019-03-10 NOTE — Telephone Encounter (Signed)
Having CMA call. She is seeing specialist as well.

## 2019-03-11 LAB — CUP PACEART REMOTE DEVICE CHECK
Date Time Interrogation Session: 20200314174211
Implantable Pulse Generator Implant Date: 20170221

## 2019-03-11 NOTE — Addendum Note (Signed)
Addended by: Agnes Lawrence on: 03/11/2019 01:25 PM   Modules accepted: Orders

## 2019-03-18 NOTE — Progress Notes (Signed)
Carelink Summary Report / Loop Recorder 

## 2019-03-19 ENCOUNTER — Telehealth (INDEPENDENT_AMBULATORY_CARE_PROVIDER_SITE_OTHER): Payer: Self-pay | Admitting: Specialist

## 2019-03-20 NOTE — Telephone Encounter (Signed)
I called and spoke with patient, Katie Mcintyre is taking Extra-strength Tylenol. I advised that per Dr. Louanne Skye Katie Mcintyre can take up to 3000mg  of it a day which is no more than 6 tablets a day. Katie Mcintyre states that Katie Mcintyre understands this.

## 2019-03-20 NOTE — Telephone Encounter (Signed)
Can take arthritis strength 3 times daily---- as long as less than 3000mg  daily

## 2019-03-21 ENCOUNTER — Ambulatory Visit: Payer: Medicare Other | Admitting: Podiatry

## 2019-03-25 ENCOUNTER — Ambulatory Visit (INDEPENDENT_AMBULATORY_CARE_PROVIDER_SITE_OTHER): Payer: Self-pay | Admitting: Physical Medicine and Rehabilitation

## 2019-03-26 ENCOUNTER — Ambulatory Visit: Payer: Medicare Other | Admitting: Physical Therapy

## 2019-03-27 ENCOUNTER — Ambulatory Visit: Payer: Medicare Other | Admitting: Internal Medicine

## 2019-04-08 ENCOUNTER — Telehealth: Payer: Self-pay | Admitting: Cardiology

## 2019-04-08 ENCOUNTER — Other Ambulatory Visit: Payer: Self-pay

## 2019-04-08 MED ORDER — IRBESARTAN 75 MG PO TABS: 150.0000 mg | ORAL_TABLET | Freq: Every day | ORAL | 1 refills | Status: DC

## 2019-04-08 NOTE — Telephone Encounter (Signed)
Spoke w/ pt and requested that she send a manual transmission w/ her home monitor. she said she would do that later today.

## 2019-04-08 NOTE — Telephone Encounter (Signed)
Manual transmission received and reviewed. 4 "AF" episodes since last summary report on 3/14--ECGs suggest AT and SR w/ectopy, longest event is 74min, similar to previous episodes reviewed by Dr. Sallyanne Kuster. Will continue to monitor remotely via [facility].

## 2019-04-09 ENCOUNTER — Ambulatory Visit (INDEPENDENT_AMBULATORY_CARE_PROVIDER_SITE_OTHER): Payer: Medicare Other | Admitting: Specialist

## 2019-04-10 ENCOUNTER — Ambulatory Visit (INDEPENDENT_AMBULATORY_CARE_PROVIDER_SITE_OTHER): Payer: Medicare Other | Admitting: *Deleted

## 2019-04-10 ENCOUNTER — Other Ambulatory Visit: Payer: Self-pay

## 2019-04-10 DIAGNOSIS — R55 Syncope and collapse: Secondary | ICD-10-CM | POA: Diagnosis not present

## 2019-04-10 LAB — CUP PACEART REMOTE DEVICE CHECK
Date Time Interrogation Session: 20200416164958
Implantable Pulse Generator Implant Date: 20170221

## 2019-04-14 ENCOUNTER — Other Ambulatory Visit: Payer: Self-pay | Admitting: Family Medicine

## 2019-04-16 ENCOUNTER — Other Ambulatory Visit: Payer: Self-pay | Admitting: Family Medicine

## 2019-04-17 NOTE — Progress Notes (Signed)
Carelink Summary Report / Loop Recorder 

## 2019-04-18 ENCOUNTER — Ambulatory Visit (INDEPENDENT_AMBULATORY_CARE_PROVIDER_SITE_OTHER): Payer: Self-pay | Admitting: Physical Medicine and Rehabilitation

## 2019-04-20 ENCOUNTER — Encounter (HOSPITAL_BASED_OUTPATIENT_CLINIC_OR_DEPARTMENT_OTHER): Payer: Medicare Other

## 2019-04-21 ENCOUNTER — Encounter (HOSPITAL_BASED_OUTPATIENT_CLINIC_OR_DEPARTMENT_OTHER): Payer: Medicare Other

## 2019-04-21 IMAGING — DX DG CHEST 2V
2 series · 2 of 2 positions shown · non-contrast
Comparison: Radiograph April 22, 2017.

CLINICAL DATA: Cough.

EXAM:
CHEST - 2 VIEW

[chest pa]
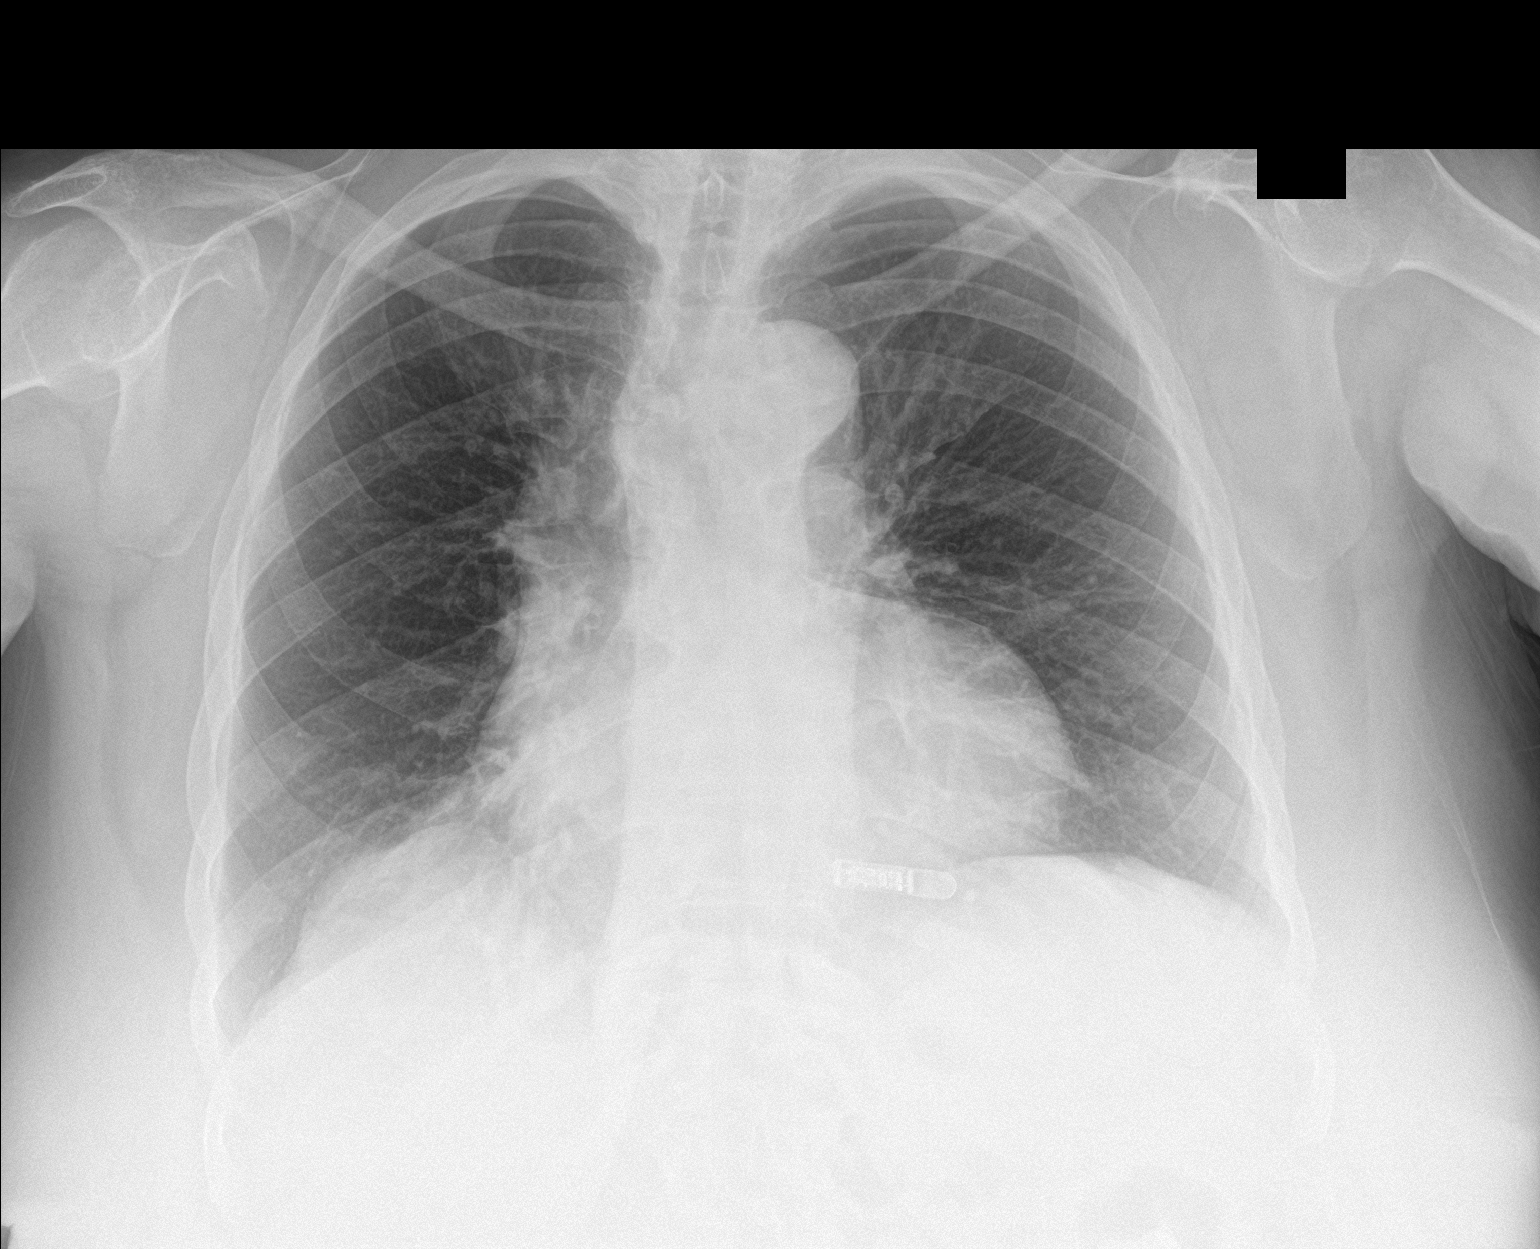

[chest lat]
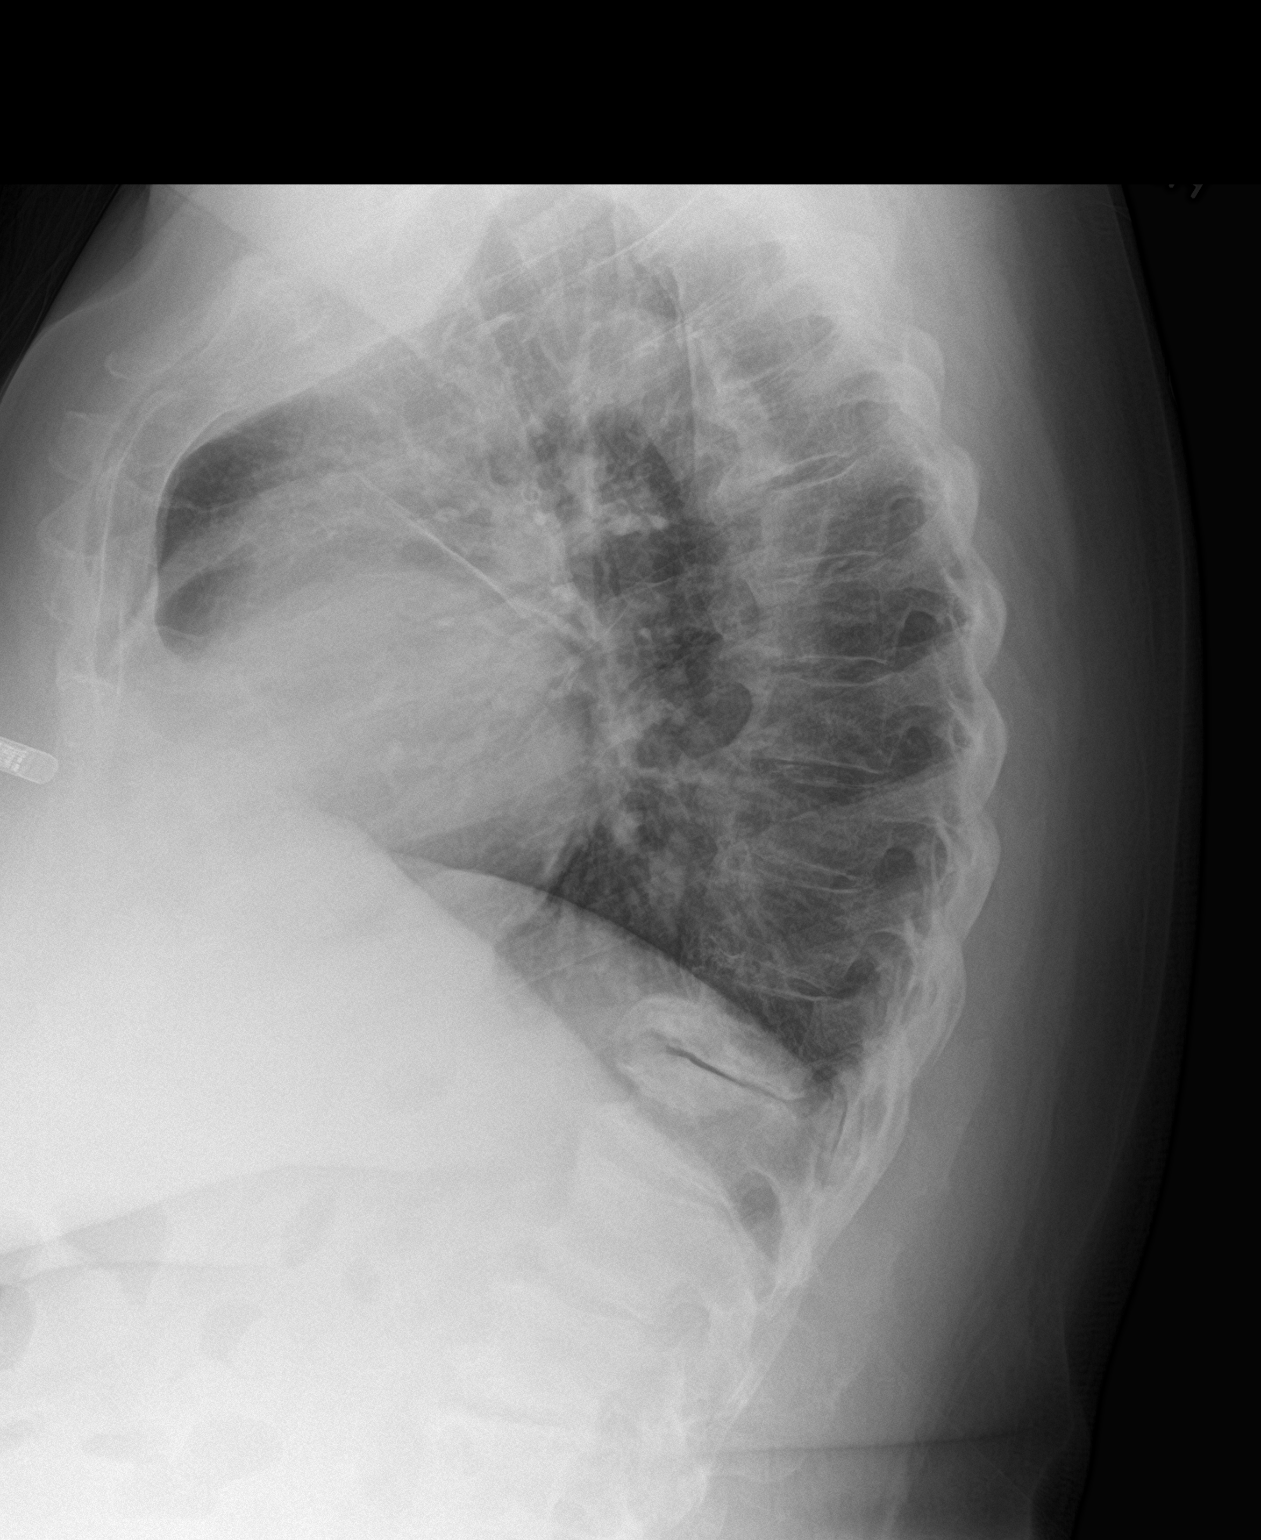

[2 of 2 positions shown; findings below may reference images not displayed]

FINDINGS: The heart size and mediastinal contours are within normal limits.
Both lungs are clear. No pneumothorax or pleural effusion is noted.
The visualized skeletal structures are unremarkable.
IMPRESSION: No active cardiopulmonary disease.

## 2019-05-02 ENCOUNTER — Ambulatory Visit: Payer: Medicare Other | Admitting: Podiatry

## 2019-05-05 ENCOUNTER — Other Ambulatory Visit: Payer: Self-pay

## 2019-05-05 MED ORDER — HYDRALAZINE HCL 50 MG PO TABS: 50.0000 mg | ORAL_TABLET | Freq: Three times a day (TID) | ORAL | 1 refills | Status: DC

## 2019-05-07 ENCOUNTER — Ambulatory Visit: Payer: Self-pay

## 2019-05-07 ENCOUNTER — Encounter: Payer: Self-pay | Admitting: Physical Medicine and Rehabilitation

## 2019-05-07 ENCOUNTER — Other Ambulatory Visit: Payer: Self-pay

## 2019-05-07 ENCOUNTER — Ambulatory Visit (INDEPENDENT_AMBULATORY_CARE_PROVIDER_SITE_OTHER): Payer: Medicare Other | Admitting: Physical Medicine and Rehabilitation

## 2019-05-07 DIAGNOSIS — M7071 Other bursitis of hip, right hip: Secondary | ICD-10-CM | POA: Diagnosis not present

## 2019-05-07 NOTE — Progress Notes (Signed)
Katie Mcintyre - 78 y.o. female MRN [id number]  Date of birth: May 31, 1941  Office Visit Note: Visit Date: 05/07/2019 PCP: Billie Ruddy, MD Referred by: [name], DO  Subjective: Chief Complaint  Patient presents with  . Right Hip - Pain   HPI:  Katie Mcintyre is a 78 y.o. female who comes in today At the request of Dan Dr. Basil Dess for repeat right initial bursa injection for posterior right hip pain that was relieved with prior bursa injection.  Patient's complicated history of hypertension and diabetes and heart disease.  She is failed conservative care otherwise and did well with injection.  ROS Otherwise per HPI.  Assessment & Plan: Visit Diagnoses:  1. Ischial bursitis of right side     Plan: No additional findings.   Meds & Orders: No orders of the defined types were placed in this encounter.   Orders Placed This Encounter  Procedures  . Large Joint Inj  . XR C-ARM NO REPORT    Follow-up: No follow-ups on file.   Procedures: Large Joint Inj ([place]) on 05/07/2019 3:10 PM Indications: pain and diagnostic evaluation Details: 22 G 3.5 in needle, fluoroscopy-guided posterior approach  Arthrogram: No  Medications: 4 mL bupivacaine 0.25 %; 60 mg triamcinolone acetonide 40 MG/ML Outcome: tolerated well, no immediate complications  There was excellent flow of contrast outlined the ischial bursa without vascular uptake. Procedure, treatment alternatives, risks and benefits explained, specific risks discussed. Consent was given by the patient. Immediately prior to procedure a time out was called to verify the correct patient, procedure, equipment, support staff and site/side marked as required. Patient was prepped and draped in the usual sterile fashion.      No notes on file   Clinical History: FINDINGS:  There is lumbosacral transitional anatomy. For the purpose of this dictation, the last well-formed disc space is designated as S1-S2 and  labeled on image as such.  Minimal anterolisthesis of L4 on L5. Normal lumbar spinal alignment otherwise. Status post L3-L5 laminectomy. Marrow is unremarkable. Conus terminates at approximately L1. The visualized spinal cord morphology and signal, and the cauda equina appear normal. Negative for epidural hematoma. Small left renal cysts. Retroperitoneal soft tissues are unremarkable. Sacroiliac joints are normal.  --T11-T12: Imaged on the sagittal plane only. Large posterior disc osteophyte complex impression on ventral cord. No cord signal abnormality. Moderate bilateral neuroforaminal narrowing. --T12-L1: Mild posterior disc bulge without canal or foraminal narrowing. Mild bilateral facet degenerative disease.  --L1-L2: There is no evidence of spinal stenosis, disc bulge or neural foraminal narrowing. Mild left facet degenerative disease. --L2-L3: Large posterior disc osteophyte complex and severe bilateral facet hypertrophy, resulting in moderate spinal canal narrowing and severe bilateral neuroforamina narrowing.  --L3-L4: Broad-based disc bulge and severe bilateral facet hypertrophy, resulting in the severe spinal canal and bilateral neural foraminal narrowing. --L4-L5: Status post laminectomy. Broad based disc bulge and severe bilateral facet hypertrophy resulting in mild spinal canal narrowing and severe left and mild right neuroforaminal narrowing.  --L5-S1: Status post laminectomy. No disc bulge or spinal canal narrowing. Severe bilateral neuroforamina narrowing. Severe bilateral facet hypertrophy   IMPRESSION: 1. Spinal canal narrowing is moderate at L2-L3, severe at L3-L4, and mild at L4-L5, mostly from degenerative facet disease. 2. Multilevel neuroforaminal narrowing, which is severe at bilateral L2-L3, bilateral L3-L4,, left L4-L5, and bilateral L5-S1.   Electronically Reviewed GY:IRSWN Roosevelt Locks, MD Electronically Reviewed on:09/09/2016 7:55 PM     Objective:   VS:  HT:    WT:   BMI:     BP:   HR: bpm  TEMP: ( )  RESP:  Physical Exam  Ortho Exam Imaging: No results found.

## 2019-05-07 NOTE — Progress Notes (Signed)
 .  Numeric Pain Rating Scale and Functional Assessment Average Pain 7   In the last MONTH (on 0-10 scale) has pain interfered with the following?  1. General activity like being  able to carry out your everyday physical activities such as walking, climbing stairs, carrying groceries, or moving a chair?  Rating(6)   -Dye Allergies.

## 2019-05-12 ENCOUNTER — Telehealth: Payer: Self-pay | Admitting: Cardiology

## 2019-05-12 ENCOUNTER — Other Ambulatory Visit: Payer: Self-pay

## 2019-05-12 ENCOUNTER — Ambulatory Visit: Payer: Medicare Other | Admitting: Podiatry

## 2019-05-12 ENCOUNTER — Encounter: Payer: Self-pay | Admitting: Podiatry

## 2019-05-12 VITALS — Temp 97.5°F

## 2019-05-12 DIAGNOSIS — E1142 Type 2 diabetes mellitus with diabetic polyneuropathy: Secondary | ICD-10-CM | POA: Diagnosis not present

## 2019-05-12 DIAGNOSIS — M79674 Pain in right toe(s): Secondary | ICD-10-CM | POA: Diagnosis not present

## 2019-05-12 DIAGNOSIS — M79675 Pain in left toe(s): Secondary | ICD-10-CM | POA: Diagnosis not present

## 2019-05-12 DIAGNOSIS — B351 Tinea unguium: Secondary | ICD-10-CM

## 2019-05-12 NOTE — Telephone Encounter (Signed)
LMOVM requesting that pt send a manual transmission w/ her home monitor.

## 2019-05-12 NOTE — Progress Notes (Signed)
Subjective: Katie Mcintyre presents today with history of diabetic neuropathy with cc of painful, mycotic toenails.  Pain is aggravated when wearing enclosed shoe gear and relieved with periodic professional debridement.  Patient has subjective symptoms of neuropathy managed with gabapentin. Katie Kern, DO    Current Outpatient Medications:  .  ACCU-CHEK AVIVA PLUS test strip, USE AS INSTRUCTED TO CHECK BLOOD SUGAR TWICE A DAY * E11.9*, Disp: 100 each, Rfl: 5 .  ACCU-CHEK FASTCLIX LANCETS MISC, USE AS DIRECTED, Disp: 102 each, Rfl: 5 .  acetaminophen (TYLENOL) 500 MG tablet, Take 1,000 mg by mouth every 8 (eight) hours as needed., Disp: , Rfl:  .  amLODipine (NORVASC) 5 MG tablet, TAKE 1 TABLET BY MOUTH ONCE DAILY, Disp: 90 tablet, Rfl: 1 .  atorvastatin (LIPITOR) 40 MG tablet, Take 40 mg by mouth daily., Disp: , Rfl:  .  B-D UF III MINI PEN NEEDLES 31G X 5 MM MISC, USE TO INJECT INSULIN TWICE A DAY, Disp: 100 each, Rfl: 3 .  Blood Glucose Calibration (ACCU-CHEK AVIVA) SOLN, Use as directed., Disp: 1 each, Rfl: 1 .  calcium-vitamin D (OSCAL 500/200 D-3) 500-200 MG-UNIT per tablet, Take 1 tablet by mouth daily., Disp: , Rfl:  .  cloNIDine (CATAPRES) 0.1 MG tablet, TAKE 1 TABLET BY MOUTH TWICE A DAY, Disp: 180 tablet, Rfl: 0 .  diltiazem (DILACOR XR) 120 MG 24 hr capsule, Take 1 capsule (120 mg total) by mouth daily., Disp: 90 capsule, Rfl: 3 .  ferrous sulfate 325 (65 FE) MG tablet, Take 325 mg by mouth daily with breakfast., Disp: , Rfl:  .  gabapentin (NEURONTIN) 100 MG capsule, Take 1 capsule (100 mg total) by mouth at bedtime., Disp: 90 capsule, Rfl: 3 .  hydrALAZINE (APRESOLINE) 50 MG tablet, Take 1 tablet (50 mg total) by mouth 3 (three) times daily., Disp: 270 tablet, Rfl: 1 .  Insulin Lispro Prot & Lispro (HUMALOG 75/25 MIX) (75-25) 100 UNIT/ML Kwikpen, Inject 16 Units into the skin 2 (two) times daily before a meal., Disp: 15 mL, Rfl: 4 .  irbesartan (AVAPRO) 75 MG tablet, Take 2  tablets (150 mg total) by mouth daily., Disp: 180 tablet, Rfl: 1 .  isosorbide dinitrate (ISORDIL) 20 MG tablet, TAKE 1 TABLET (20 MG TOTAL) BY MOUTH 2 (TWO) TIMES DAILY., Disp: 180 tablet, Rfl: 1 .  Lancet Devices (ACCU-CHEK SOFTCLIX) lancets, Use as instructed twice a day, Disp: 1 each, Rfl: 5 .  levothyroxine (SYNTHROID) 125 MCG tablet, TAKE 1 TABLET BY MOUTH EVERY DAY, Disp: 90 tablet, Rfl: 0 .  metFORMIN (GLUCOPHAGE) 1000 MG tablet, TAKE 1 TABLET BY MOUTH TWICE DAILY WITH MEALS, Disp: 180 tablet, Rfl: 1 .  metFORMIN (GLUCOPHAGE) 1000 MG tablet, TAKE 1 TABLET BY MOUTH TWICE DAILY WITH MEALS, Disp: 180 tablet, Rfl: 1 .  Sennosides-Docusate Sodium (STOOL SOFTENER & LAXATIVE PO), Take 1 tablet by mouth daily as needed (constipation). , Disp: , Rfl:  .  vitamin B-12 (CYANOCOBALAMIN) 1000 MCG tablet, Take by mouth., Disp: , Rfl:   Current Facility-Administered Medications:  .  0.9 %  sodium chloride infusion, 500 mL, Intravenous, Once, Pyrtle, Lajuan Lines, MD .  ipratropium-albuterol (DUONEB) 0.5-2.5 (3) MG/3ML nebulizer solution 3 mL, 3 mL, Nebulization, Once, Delano Metz, FNP  Allergies  Allergen Reactions  . Advil [Ibuprofen]     Was taking this for pain and had some ? Swollen lips  . Lyrica [Pregabalin]     Extreme swelling legs    Objective:  Vascular Examination: Capillary  refill time immediate x 10 digits.  Dorsalis pedis and Posterior tibial pulses present b/l.  Digital hair x 10 digits was absent.  Skin temperature gradient WNL b/l.  Dermatological Examination: Pedal skin atrophic b/l.  No open wounds.  No interdigital macerations b/l.  Toenails 1-5 b/l discolored, thick, dystrophic with subungual debris and pain with palpation to nailbeds due to thickness of nails.  Incurvated nailplate right great toe medial border with tenderness to palpation. No erythema, no edema, no drainage noted.  Musculoskeletal: Ambulates with cane.  Hammertoe 2nd digit b/l.  Muscle  strength 5/5 to all muscle groups b/l.  Neurological: Sensation with 10 gram monofilament is absent b/l.  Vibratory sensation absent b/l.  Assessment: 1. Painful onychomycosis toenails 1-5 b/l 2. NIDDM with neuropathy  Plan: 1. Continue diabetic foot care principles. Literature dispensed. 2. Toenails 1-5 b/l were debrided in length and girth without iatrogenic bleeding. Offending nail borders debrided and curretaged right great toe. Border cleansed with alcohol. Antibiotic ointment applied. No further treatment required by patient. 3. Patient to continue soft, supportive shoe gear. 4. Patient to report any pedal injuries to medical professional  5. Follow up 3 months.  6. Patient/POA to call should there be a concern in the interim.

## 2019-05-12 NOTE — Patient Instructions (Signed)
Diabetes Mellitus and Foot Care  Foot care is an important part of your health, especially when you have diabetes. Diabetes may cause you to have problems because of poor blood flow (circulation) to your feet and legs, which can cause your skin to:   Become thinner and drier.   Break more easily.   Heal more slowly.   Peel and crack.  You may also have nerve damage (neuropathy) in your legs and feet, causing decreased feeling in them. This means that you may not notice minor injuries to your feet that could lead to more serious problems. Noticing and addressing any potential problems early is the best way to prevent future foot problems.  How to care for your feet  Foot hygiene   Wash your feet daily with warm water and mild soap. Do not use hot water. Then, pat your feet and the areas between your toes until they are completely dry. Do not soak your feet as this can dry your skin.   Trim your toenails straight across. Do not dig under them or around the cuticle. File the edges of your nails with an emery board or nail file.   Apply a moisturizing lotion or petroleum jelly to the skin on your feet and to dry, brittle toenails. Use lotion that does not contain alcohol and is unscented. Do not apply lotion between your toes.  Shoes and socks   Wear clean socks or stockings every day. Make sure they are not too tight. Do not wear knee-high stockings since they may decrease blood flow to your legs.   Wear shoes that fit properly and have enough cushioning. Always look in your shoes before you put them on to be sure there are no objects inside.   To break in new shoes, wear them for just a few hours a day. This prevents injuries on your feet.  Wounds, scrapes, corns, and calluses   Check your feet daily for blisters, cuts, bruises, sores, and redness. If you cannot see the bottom of your feet, use a mirror or ask someone for help.   Do not cut corns or calluses or try to remove them with medicine.   If you  find a minor scrape, cut, or break in the skin on your feet, keep it and the skin around it clean and dry. You may clean these areas with mild soap and water. Do not clean the area with peroxide, alcohol, or iodine.   If you have a wound, scrape, corn, or callus on your foot, look at it several times a day to make sure it is healing and not infected. Check for:  ? Redness, swelling, or pain.  ? Fluid or blood.  ? Warmth.  ? Pus or a bad smell.  General instructions   Do not cross your legs. This may decrease blood flow to your feet.   Do not use heating pads or hot water bottles on your feet. They may burn your skin. If you have lost feeling in your feet or legs, you may not know this is happening until it is too late.   Protect your feet from hot and cold by wearing shoes, such as at the beach or on hot pavement.   Schedule a complete foot exam at least once a year (annually) or more often if you have foot problems. If you have foot problems, report any cuts, sores, or bruises to your health care provider immediately.  Contact a health care provider if:     You have a medical condition that increases your risk of infection and you have any cuts, sores, or bruises on your feet.   You have an injury that is not healing.   You have redness on your legs or feet.   You feel burning or tingling in your legs or feet.   You have pain or cramps in your legs and feet.   Your legs or feet are numb.   Your feet always feel cold.   You have pain around a toenail.  Get help right away if:   You have a wound, scrape, corn, or callus on your foot and:  ? You have pain, swelling, or redness that gets worse.  ? You have fluid or blood coming from the wound, scrape, corn, or callus.  ? Your wound, scrape, corn, or callus feels warm to the touch.  ? You have pus or a bad smell coming from the wound, scrape, corn, or callus.  ? You have a fever.  ? You have a red line going up your leg.  Summary   Check your feet every day  for cuts, sores, red spots, swelling, and blisters.   Moisturize feet and legs daily.   Wear shoes that fit properly and have enough cushioning.   If you have foot problems, report any cuts, sores, or bruises to your health care provider immediately.   Schedule a complete foot exam at least once a year (annually) or more often if you have foot problems.  This information is not intended to replace advice given to you by your health care provider. Make sure you discuss any questions you have with your health care provider.  Document Released: 12/08/2000 Document Revised: 01/23/2018 Document Reviewed: 01/12/2017  Elsevier Interactive Patient Education  2019 [employer]

## 2019-05-13 ENCOUNTER — Ambulatory Visit (INDEPENDENT_AMBULATORY_CARE_PROVIDER_SITE_OTHER): Payer: Medicare Other | Admitting: *Deleted

## 2019-05-13 ENCOUNTER — Encounter (HOSPITAL_BASED_OUTPATIENT_CLINIC_OR_DEPARTMENT_OTHER): Payer: Medicare Other

## 2019-05-13 ENCOUNTER — Other Ambulatory Visit: Payer: Self-pay | Admitting: Family Medicine

## 2019-05-13 DIAGNOSIS — R55 Syncope and collapse: Secondary | ICD-10-CM | POA: Diagnosis not present

## 2019-05-13 NOTE — Telephone Encounter (Signed)
Spoke w/ pt and requested that she send a manual transmission w/ her home monitor. Pt stated that she knew how to do this and would send transmission later today.

## 2019-05-14 ENCOUNTER — Other Ambulatory Visit: Payer: Self-pay

## 2019-05-14 ENCOUNTER — Ambulatory Visit: Payer: Medicare Other | Attending: Specialist

## 2019-05-14 ENCOUNTER — Encounter (HOSPITAL_BASED_OUTPATIENT_CLINIC_OR_DEPARTMENT_OTHER): Payer: Medicare Other

## 2019-05-14 DIAGNOSIS — M25651 Stiffness of right hip, not elsewhere classified: Secondary | ICD-10-CM | POA: Diagnosis present

## 2019-05-14 DIAGNOSIS — R252 Cramp and spasm: Secondary | ICD-10-CM | POA: Diagnosis present

## 2019-05-14 DIAGNOSIS — R262 Difficulty in walking, not elsewhere classified: Secondary | ICD-10-CM | POA: Insufficient documentation

## 2019-05-14 DIAGNOSIS — M25551 Pain in right hip: Secondary | ICD-10-CM | POA: Diagnosis present

## 2019-05-14 LAB — CUP PACEART REMOTE DEVICE CHECK
Date Time Interrogation Session: 20200519150422
Implantable Pulse Generator Implant Date: 20170221

## 2019-05-14 NOTE — Patient Instructions (Signed)
Supine gluteal sets, hip ER , LTR  all x 10 reps  2x/day

## 2019-05-14 NOTE — Therapy (Addendum)
[place], [place], [address] Phone: [phone]   Fax:  [phone]  Physical Therapy Evaluation/Discharge  Patient Details  Name: Katie Mcintyre MRN: [id number] Date of Birth: 08-03-1941 Referring Provider (PT): Basil Dess, MD   Encounter Date: 05/14/2019  PT End of Session - 05/14/19 1222    Visit Number  1    Number of Visits  12    Date for PT Re-Evaluation  06/27/19    Authorization Type  [employer] MCR    PT Start Time  5449    PT Stop Time  1240    PT Time Calculation (min)  55 min    Activity Tolerance  Patient limited by pain    Behavior During Therapy  [facility] for tasks assessed/performed       Past Medical History:  Diagnosis Date  . Anemia   . Arthritis   . Cancer ([place]) 1969   , Status post complete hysterectomy per patient  . Cataract    removed both eyes with lens implant both   . Chicken pox   . Chronic bronchitis ([place])   . Colon polyp   . Depression 06/30/2013  . Diabetes mellitus without complication ([place])   . Diverticulitis   . Frequent headaches   . GERD (gastroesophageal reflux disease)   . Gout 06/30/2013  . Hx of dizziness   . Hyperlipidemia   . Hypertension   . Post-operative nausea and vomiting   . S/P epidural steroid injection 09/27/2016   done in back at [place]   . Spinal stenosis of lumbar region    s/p spine surgery 02/12/13 with Dr Marcos Eke at [facility]  . Thyroid disease   . Urine incontinence   . Use of cane as ambulatory aid     Past Surgical History:  Procedure Laterality Date  . ABDOMINAL HYSTERECTOMY  1970   complete hysterectomy for cervical cancer  . BACK SURGERY  01/2013  . BACK SURGERY  1995  . CATARACT EXTRACTION, BILATERAL     with lens implant   . CERVICAL CONE BIOPSY  1970  . COLONOSCOPY    . EP IMPLANTABLE DEVICE N/A 02/15/2016   Procedure: Loop Recorder Insertion;  Surgeon: Sanda Klein, MD;  Location: [place] [facility];  Service: Cardiovascular;   Laterality: N/A;  . POLYPECTOMY    . WRIST SURGERY  2010   for wrist fracture    There were no vitals filed for this visit.   Subjective Assessment - 05/14/19 1148    Subjective  She reports hip pain with bursitis. Back side hurts with sitting.   Soft chair to ease pain.   Hip pain for over 3 months.  Pain may be from sitting so much due to back pain.     Pertinent History  She had injection to bursa and now reports no benefit. , Lumbar surgery, scoliosis , stenosis    Limitations  Walking;Sitting    How long can you sit comfortably?  depends on chair    How long can you stand comfortably?  5 min  SOB and back pain    How long can you walk comfortably?  5 min SOB and back pain    Diagnostic tests  No testing    Patient Stated Goals  She wants to be stronger and decr pain    Currently in Pain?  Yes    Pain Score  3     Pain Location  Hip    Pain Orientation  Right    Pain Descriptors / Indicators  Aching;Sharp    Pain Type  Chronic pain    Pain Onset  More than a month ago    Pain Frequency  Constant    Aggravating Factors   sitting and walking /standing    Pain Relieving Factors  sit for back pain , soft chair for butt/hip    Multiple Pain Sites  Yes    Pain Location  Back    Pain Orientation  Right;Left;Lower    Pain Type  Chronic pain    Pain Onset  More than a month ago    Pain Frequency  Constant    Aggravating Factors   being on feet    Pain Relieving Factors  sitting         [facility] PT Assessment - 05/14/19 0001      Assessment   Medical Diagnosis  RT hip bursitis    Referring Provider (PT)  Basil Dess, MD    Onset Date/Surgical Date  --   3 months or more   Next MD Visit  next week    Prior Therapy  No to hip, PT to back in past      Precautions   Precautions  None;Fall      Restrictions   Weight Bearing Restrictions  No      Balance Screen   Has the patient fallen in the past 6 months  No      [place] residence     Living Arrangements  Alone    Type of [place] to enter    Entrance Stairs-Number of Steps  1 garage    Home Layout  Two level;Able to live on main level with bedroom/bathroom    [place] - single point      Prior Function   Level of Independence  Needs assistance with homemaking      Cognition   Overall Cognitive Status  Within Functional Limits for tasks assessed      Observation/Other Assessments   Focus on Therapeutic Outcomes (FOTO)   55% limited      Posture/Postural Control   Posture Comments  scoliosis, flat LS, valgus at knee and unlevel pelvis , flexed at trunk      ROM / Strength   AROM / PROM / Strength  AROM;PROM;Strength      AROM   AROM Assessment Site  Lumbar    Lumbar Flexion  70    Lumbar Extension  10    Lumbar - Right Side Bend  15    Lumbar - Left Side Bend  15      PROM   Overall PROM Comments  Decreased hip ROM rotation IR and ER  bilaterally      Strength   Overall Strength Comments  LE WFL but weakness win both hips RT mostly due to pain it appears.       Flexibility   Soft Tissue Assessment /Muscle Length  yes    Hamstrings  pain with SLR on RT but good ROM equal LT        Palpation   Palpation comment  significnatly tender to even light touch. over RT gluteals /lateral hip to knee.       Ambulation/Gait   Gait Comments  decr weight o RT Le with flexed trunk and SPC for balance.   .  Quite unsteady without cane but  no LOB.  significant trendelenberg LT hip                Objective measurements completed on examination: See above findings.      [facility] Adult PT Treatment/Exercise - 05/14/19 0001      Modalities   Modalities  Moist Heat      Moist Heat Therapy   Number Minutes Moist Heat  12 Minutes    Moist Heat Location  Hip   RT and lateral thigh LT sidelye            PT Education - 05/14/19 1147    Education Details  POC, HEP    Person(s) Educated  Patient    Methods   Explanation;Demonstration;Tactile cues;Verbal cues;Handout    Comprehension  Verbalized understanding;Returned demonstration       PT Short Term Goals - 05/14/19 1227      PT SHORT TERM GOAL #1   Title  she will be indpendent with initial HEP    Time  2    Period  Weeks    Status  New      PT SHORT TERM GOAL #2   Title  She will report 10-20% decr pain.     Time  2    Period  Weeks    Status  New        PT Long Term Goals - 05/14/19 1228      PT LONG TERM GOAL #1   Title  She will be indpendnet with all HEp issued    Time  6    Period  Weeks    Status  New      PT LONG TERM GOAL #2   Title  She will report able to sit with 1-2 macx hip pain as needed to rest due to LBP    Time  6    Period  Weeks    Status  New      PT LONG TERM GOAL #3   Title  She will report walking with 50% or more decreased  RT hip pain    Time  6    Period  Weeks    Status  New      PT LONG TERM GOAL #4   Title  She will report RT hip pain as intermittant    Time  6    Period  Weeks    Status  New      PT LONG TERM GOAL #5   Title  She will report sleep not limited due to RT hip pain    Time  6    Period  Weeks    Status  New             Plan - 05/14/19 1223    Clinical Impression Statement  Ms Troost presents with multiple areas of complaint but recent RT hip pain and tenderness making sitting for relief due to LBP painful/difficulty She is very tender to light touch and has pain with all RT hip motions and back flexion and This also limits time on feet but LBP from stenosis the primary factor for this.   We should be able to ease the hip pain but she is very tender.      Personal Factors and Comorbidities  Age;Comorbidity 1;Comorbidity 2    Examination-Activity Limitations  Bed Mobility;Stairs;Bend;Stand;Transfers;Sit;Sleep    Examination-Participation Restrictions  Community Activity;Laundry;Meal Prep;Cleaning    Stability/Clinical Decision Making  Evolving/Moderate  complexity    Clinical Decision Making  Moderate    Rehab Potential  Good    PT Frequency  2x / week    PT Duration  6 weeks    PT Treatment/Interventions  Taping;Passive range of motion;Manual techniques;Dry needling;Ultrasound;Iontophoresis 22m/ml Dexamethasone;Moist Heat;Therapeutic exercise;Balance training;Patient/family education    PT Next Visit Plan  manual and modalities to RT hip.  Gentle active ROM RT hip    Consulted and Agree with Plan of Care  Patient       Patient will benefit from skilled therapeutic intervention in order to improve the following deficits and impairments:  Pain, Postural dysfunction, Increased muscle spasms, Decreased range of motion, Decreased activity tolerance, Decreased strength, Difficulty walking, Decreased balance, Obesity  Visit Diagnosis: Pain in right hip - Plan: PT plan of care cert/re-cert  Difficulty in walking, not elsewhere classified - Plan: PT plan of care cert/re-cert  Stiffness of right hip joint - Plan: PT plan of care cert/re-cert  Cramp and spasm - Plan: PT plan of care cert/re-cert     Problem List Patient Active Problem List   Diagnosis Date Noted  . PAF (paroxysmal atrial fibrillation) ([facility] 11/12/2018  . Hyperlipidemia associated with type 2 diabetes mellitus ([facility] 11/12/2018  . Hypertension associated with diabetes ([facility] 02/28/2018  . Mild episode of recurrent major depressive disorder ([facility] 08/21/2017  . Morbid obesity ([facility] 01/15/2017  . Status post placement of implantable loop recorder 08/17/2016  . Syncope 02/02/2016  . Bradycardia 02/02/2016  . SOB (shortness of breath) 02/02/2016  . Pre-syncope 02/02/2016  . History of colonic polyps 01/11/2016  . Type 2 diabetes mellitus with circulatory disorder, with long-term current use of insulin ([facility] 01/11/2016  . Chronic back pain - managed by Dr. [place]os [place]at [place]/19/2015  . Gout 06/30/2013  . Hypothyroidism 06/30/2013  . Iron deficiency anemia 06/30/2013  .  Venous stasis of lower extremity 01/30/2013  . Refusal of blood transfusion for reasons of conscience 01/30/2013  . Lumbar disc disease with radiculopathy 01/30/2013    CDarrel Hoover  PT 05/14/2019, 1:10 PM  [facility] [address]GT[address] [place] [address]Phone: [phone]  Fax:  [phone] Name: SJADEE GOLEBIEWSKIMRN: [id number] of Birth: 4Apr 29, 1942PHYSICAL THERAPY DISCHARGE SUMMARY  Visits from Start of Care: Eval only  Current functional level related to goals / functional outcomes: Spoke with Ms TGuymonand she reports she does not feel well and her back needs an injection to ease pain. She wanted discharge   Remaining deficits: No changes .  LBP limiting pt   Education / Equipment: NA Plan: Patient agrees to discharge.  Patient goals were not met. Patient is being discharged due to the patient's request.  ?????  SPearson ForsterPT    06/12/19

## 2019-05-14 NOTE — Telephone Encounter (Signed)
AF episodes SR with PAC's. Will continue to monitor  Chanetta Marshall, NP 05/14/2019 7:10 AM

## 2019-05-17 ENCOUNTER — Other Ambulatory Visit: Payer: Self-pay | Admitting: Family Medicine

## 2019-05-20 ENCOUNTER — Telehealth: Payer: Self-pay | Admitting: Internal Medicine

## 2019-05-20 NOTE — Telephone Encounter (Signed)
Virtual Visit Pre-Appointment Phone Call  "I am calling you today to discuss your upcoming appointment. We are currently trying to limit exposure to the virus that causes COVID-19 by seeing patients at home rather than in the office."  1. "What is the BEST phone number to call the day of the visit?" - mobile # - iPhone for video visit  2. Do you have or have access to (through a family member/friend) a smartphone with video capability that we can use for your visit?" YES  3. Confirm consent - "In the setting of the current Covid19 crisis, you are scheduled for a (phone or video) visit with your provider on (date) at (time).  Just as we do with many in-office visits, in order for you to participate in this visit, we must obtain consent.  If you'd like, I can send this to your mychart (if signed up) or email for you to review.  Otherwise, I can obtain your verbal consent now.  All virtual visits are billed to your insurance company just like a normal visit would be.  By agreeing to a virtual visit, we'd like you to understand that the technology does not allow for your provider to perform an examination, and thus may limit your provider's ability to fully assess your condition. If your provider identifies any concerns that need to be evaluated in person, we will make arrangements to do so.  Finally, though the technology is pretty good, we cannot assure that it will always work on either your or our end, and in the setting of a video visit, we may have to convert it to a phone-only visit.  In either situation, we cannot ensure that we have a secure connection.  Are you willing to proceed?" STAFF: Did the patient verbally acknowledge consent to telehealth visit? Document YES/NO here: YES  4. Advise patient to be prepared - "Two hours prior to your appointment, go ahead and check your blood pressure, pulse, oxygen saturation, and your weight (if you have the equipment to check those) and write them all  down. When your visit starts, your provider will ask you for this information. If you have an Apple Watch or Kardia device, please plan to have heart rate information ready on the day of your appointment. Please have a pen and paper handy nearby the day of the visit as well."  5. Give patient instructions for MyChart download to smartphone OR Doximity/Doxy.me as below if video visit (depending on what platform provider is using)  6. Inform patient they will receive a phone call 15 minutes prior to their appointment time (may be from unknown caller ID) so they should be prepared to answer    TELEPHONE CALL NOTE  Katie Mcintyre has been deemed a candidate for a follow-up tele-health visit to limit community exposure during the Covid-19 pandemic. I spoke with the patient via phone to ensure availability of phone/video source, confirm preferred email & phone number, and discuss instructions and expectations.  I reminded Katie Mcintyre to be prepared with any vital sign and/or heart rhythm information that could potentially be obtained via home monitoring, at the time of her visit. I reminded Katie Mcintyre to expect a phone call prior to her visit.  Fidel Levy, RN 05/20/2019 11:20 AM   INSTRUCTIONS FOR DOWNLOADING THE MYCHART APP TO SMARTPHONE  - The patient must first make sure to have activated MyChart and know their login information - If Apple, go to App  Store and type in [employer] in the search bar and download the app. If Android, ask patient to go to [employer] and type in [place] in the search bar and download the app. The app is free but as with any other app downloads, their phone may require them to verify saved payment information or Apple/Android password.  - The patient will need to then log into the app with their MyChart username and password, and select [facility] as their healthcare provider to link the account. When it is time for your visit, go to the MyChart app,  find appointments, and click Begin Video Visit. Be sure to Select Allow for your device to access the Microphone and Camera for your visit. You will then be connected, and your provider will be with you shortly.  **If they have any issues connecting, or need assistance please contact MyChart service desk [phone])**  **If using a computer, in order to ensure the best quality for their visit they will need to use either of the following Internet Browsers: [employer], or Google Chrome**  IF USING DOXIMITY or DOXY.ME - The patient will receive a link just prior to their visit by text.     FULL LENGTH CONSENT FOR TELE-HEALTH VISIT   I hereby voluntarily request, consent and authorize [facility] and its employed or contracted physicians, physician assistants, nurse practitioners or other licensed health care professionals (the Practitioner), to provide me with telemedicine health care services (the Services") as deemed necessary by the treating Practitioner. I acknowledge and consent to receive the Services by the Practitioner via telemedicine. I understand that the telemedicine visit will involve communicating with the Practitioner through live audiovisual communication technology and the disclosure of certain medical information by electronic transmission. I acknowledge that I have been given the opportunity to request an in-person assessment or other available alternative prior to the telemedicine visit and am voluntarily participating in the telemedicine visit.  I understand that I have the right to withhold or withdraw my consent to the use of telemedicine in the course of my care at any time, without affecting my right to future care or treatment, and that the Practitioner or I may terminate the telemedicine visit at any time. I understand that I have the right to inspect all information obtained and/or recorded in the course of the telemedicine visit and may receive copies  of available information for a reasonable fee.  I understand that some of the potential risks of receiving the Services via telemedicine include:   Delay or interruption in medical evaluation due to technological equipment failure or disruption;  Information transmitted may not be sufficient (e.g. poor resolution of images) to allow for appropriate medical decision making by the Practitioner; and/or   In rare instances, security protocols could fail, causing a breach of personal health information.  Furthermore, I acknowledge that it is my responsibility to provide information about my medical history, conditions and care that is complete and accurate to the best of my ability. I acknowledge that Practitioner's advice, recommendations, and/or decision may be based on factors not within their control, such as incomplete or inaccurate data provided by me or distortions of diagnostic images or specimens that may result from electronic transmissions. I understand that the practice of medicine is not an exact science and that Practitioner makes no warranties or guarantees regarding treatment outcomes. I acknowledge that I will receive a copy of this consent concurrently upon execution via email to the email address  I last provided but may also request a printed copy by calling the office of [facility].    I understand that my insurance will be billed for this visit.   I have read or had this consent read to me.  I understand the contents of this consent, which adequately explains the benefits and risks of the Services being provided via telemedicine.   I have been provided ample opportunity to ask questions regarding this consent and the Services and have had my questions answered to my satisfaction.  I give my informed consent for the services to be provided through the use of telemedicine in my medical care  By participating in this telemedicine visit I agree to the above.

## 2019-05-21 ENCOUNTER — Ambulatory Visit (INDEPENDENT_AMBULATORY_CARE_PROVIDER_SITE_OTHER): Payer: Medicare Other | Admitting: Family Medicine

## 2019-05-21 ENCOUNTER — Telehealth: Payer: Self-pay | Admitting: Internal Medicine

## 2019-05-21 ENCOUNTER — Other Ambulatory Visit: Payer: Self-pay

## 2019-05-21 ENCOUNTER — Encounter: Payer: Self-pay | Admitting: Family Medicine

## 2019-05-21 DIAGNOSIS — I480 Paroxysmal atrial fibrillation: Secondary | ICD-10-CM

## 2019-05-21 DIAGNOSIS — M5116 Intervertebral disc disorders with radiculopathy, lumbar region: Secondary | ICD-10-CM | POA: Diagnosis not present

## 2019-05-21 DIAGNOSIS — Z794 Long term (current) use of insulin: Secondary | ICD-10-CM

## 2019-05-21 DIAGNOSIS — E1159 Type 2 diabetes mellitus with other circulatory complications: Secondary | ICD-10-CM | POA: Diagnosis not present

## 2019-05-21 DIAGNOSIS — E039 Hypothyroidism, unspecified: Secondary | ICD-10-CM

## 2019-05-21 DIAGNOSIS — I10 Essential (primary) hypertension: Secondary | ICD-10-CM

## 2019-05-21 DIAGNOSIS — R5383 Other fatigue: Secondary | ICD-10-CM

## 2019-05-21 NOTE — Telephone Encounter (Signed)
smartphone/ consent/ my chart active/ pre reg completed °

## 2019-05-21 NOTE — Progress Notes (Signed)
Virtual Visit via Telephone Note  I connected with Katie Mcintyre on 05/21/19 at 11:30 AM EDT by telephone and verified that I am speaking with the correct person using two identifiers.   I discussed the limitations, risks, security and privacy concerns of performing an evaluation and management service by telephone and the availability of in person appointments. I also discussed with the patient that there may be a patient responsible charge related to this service. The patient expressed understanding and agreed to proceed.  Location patient: home Location provider: work or home office Participants present for the call: patient, provider Patient did not have a visit in the prior 7 days to address this/these issue(s).   History of Present Illness: Pt is a 78 yo female following up on chronic conditions and TOC, previously seen by Dr. Maudie Mcintyre.  DM II:  Dx'd in the 80's.  Checking fsbs daily, range 97-169.   Has grits or oatmeal in am.  Eating chicken and salads.  Hgb A1C was 8.5% in March.  States has gained weight, clothes fitting tighter.  Drinking coffee, apple juice, sprite, some water.  Has an appt with Dr. Posey Mcintyre, her new ophthalmologist in June.  States was receiving injections in her eye but the provider who was doing this left the practice.  Back problems:  Unable to exercise. Has an upcoming appt. Started therapy last wk, but states will probably be unable to continue 2/2 the cost of the copay.  Followed by Dr. Louanne Mcintyre.  Dr. Ernestina Mcintyre for injection, lasted x 1 wk.  Was having ischial bursitis pain.  Afib:  When asked about it pt states "what is afib".  States has a loop recorder.  Sees Cardiology, Dr. Debara Mcintyre for bp per pt.  Denies palpitations.  Hypothyroidism: taking synthroid.  Endorses constipation and feeling tired.  Taking a stool softener for constipation.  Taking iron supplement for h/o anemia.   Observations/Objective: Patient sounds cheerful and well on the phone. I do not  appreciate any SOB. Speech and thought processing are grossly intact. Patient reported vitals:  Assessment and Plan: PAF (paroxysmal atrial fibrillation) ([place]) -continue f/u with Dr. Debara Mcintyre -continue Diltiazem XR 120 mg, Isosorbide 20 mg BID  Type 2 diabetes mellitus with other circulatory complication, with long-term current use of insulin ([facility]) -last hgb A1c 8.2% -lifestyle modifications encouraged -continue metformin 1000 mg BID, humalog 75/25 16 units BID before meals -continue lipitor  Lumbar disc disease with radiculopathy -continue f/u with specialist  Fatigue, unspecified type  - Plan: CBC (no diff), TSH, Vitamin D, 25-hydroxy  Hypothyroidism, unspecified type  -continue synthroid 125 mcg daily - Plan: TSH  Essential hypertension -continue Norvasc 5 mg, clonidine 0.1 mg BID, hydralazine 50mg  TID,  Irbesartan 150 mg daily,    Follow Up Instructions: F/u prn.  Pt to have labs done next wk.  I did not refer this patient for an OV in the next 24 hours for this/these issue(s).  I discussed the assessment and treatment plan with the patient. The patient was provided an opportunity to ask questions and all were answered. The patient agreed with the plan and demonstrated an understanding of the instructions.   The patient was advised to call back or seek an in-person evaluation if the symptoms worsen or if the condition fails to improve as anticipated.  I provided 17 minutes of non-face-to-face time during this encounter.   Katie Ruddy, MD

## 2019-05-22 ENCOUNTER — Ambulatory Visit: Payer: Medicare Other | Admitting: Specialist

## 2019-05-22 ENCOUNTER — Telehealth: Payer: Self-pay | Admitting: Family Medicine

## 2019-05-22 NOTE — Progress Notes (Signed)
Carelink Summary Report / Loop Recorder 

## 2019-05-22 NOTE — Telephone Encounter (Signed)
Copied from [facility] [id number]. Topic: General - Other >> May 22, 2019 12:09 PM Pauline Good wrote: Reason for CRM:pt want to have a Shingles and pneumonia shot. Please call pt to sched

## 2019-05-23 ENCOUNTER — Telehealth (INDEPENDENT_AMBULATORY_CARE_PROVIDER_SITE_OTHER): Payer: Medicare Other | Admitting: Internal Medicine

## 2019-05-23 ENCOUNTER — Encounter: Payer: Self-pay | Admitting: Internal Medicine

## 2019-05-23 ENCOUNTER — Telehealth: Payer: Self-pay | Admitting: Internal Medicine

## 2019-05-23 DIAGNOSIS — I10 Essential (primary) hypertension: Secondary | ICD-10-CM

## 2019-05-23 DIAGNOSIS — E1169 Type 2 diabetes mellitus with other specified complication: Secondary | ICD-10-CM | POA: Diagnosis not present

## 2019-05-23 DIAGNOSIS — E785 Hyperlipidemia, unspecified: Secondary | ICD-10-CM

## 2019-05-23 DIAGNOSIS — Z95818 Presence of other cardiac implants and grafts: Secondary | ICD-10-CM

## 2019-05-23 DIAGNOSIS — I471 Supraventricular tachycardia: Secondary | ICD-10-CM

## 2019-05-23 DIAGNOSIS — R55 Syncope and collapse: Secondary | ICD-10-CM

## 2019-05-23 MED ORDER — CARVEDILOL 6.25 MG PO TABS: 6.2500 mg | ORAL_TABLET | Freq: Two times a day (BID) | ORAL | 3 refills | Status: DC

## 2019-05-23 NOTE — Telephone Encounter (Signed)
Spoke with pt advised to ask for the shringrix vaccine from her pharmacy due to her age, pt states that she will check with her pharmacy if they can give her PNA booster and if not she will call the office back to scheduled appointment for pna booster

## 2019-05-23 NOTE — Progress Notes (Signed)
Virtual Visit via Video Note   This visit type was conducted due to national recommendations for restrictions regarding the COVID-19 Pandemic (e.g. social distancing) in an effort to limit this patient's exposure and mitigate transmission in our community.  Due to her co-morbid illnesses, this patient is at least at moderate risk for complications without adequate follow up.  This format is felt to be most appropriate for this patient at this time.  All issues noted in this document were discussed and addressed.  A limited physical exam was performed with this format.  Please refer to the patient's chart for her consent to telehealth for [name].   Evaluation Performed:  Doxy[employer]me video visit  Date:  05/23/2019   ID:  Katie Mcintyre, Katie Mcintyre Apr 13, 1941, MRN [id number]  Patient Location:  [address] [place] [address]  Provider location:   [address], [place] [address], [place] [address]  PCP:  Billie Ruddy, MD  Cardiologist:  No primary care provider on file. Electrophysiologist:  None   Chief Complaint:  Fatigue  History of Present Illness:    Katie Mcintyre is a 78 y.o. female who presents via audio/video conferencing for a telehealth visit today.  Katie Mcintyre was seen today for video follow-up.  Overall she is doing well although is complaining of some fatigue.  She denies any chest pain or shortness of breath.  She has had no further syncope.  She denies palpitations.  She does have an implanted loop recorder numerous follow-up visits have shown atrial tachycardia frequent PACs but no evidence of atrial fibrillation that was clear or sustained.  She is not currently anticoagulated nor is she on aspirin, but does take several grams of acetaminophen daily for pain.  Also very fatigued, I think this may be due to her clonidine.  Her blood pressures been suboptimally controlled.  I had her on diltiazem for rate control however her PCP placed her on amlodipine as well.   She really has not had improvement in blood pressures.  Her last pressure was 160 systolic.  Unfortunately she does not have a blood pressure cuff at home.  I suspect it is however not better controlled at this point.  The patient does not have symptoms concerning for COVID-19 infection (fever, chills, cough, or new SHORTNESS OF BREATH).    Prior CV studies:   The following studies were reviewed today:  Chart reviewed Lab work  PMHx:  Past Medical History:  Diagnosis Date   Anemia    Arthritis    Cancer ([place]) 1969   , Status post complete hysterectomy per patient   Cataract    removed both eyes with lens implant both    Chicken pox    Chronic bronchitis ([place])    Colon polyp    Depression 06/30/2013   Diabetes mellitus without complication ([place])    Diverticulitis    Frequent headaches    GERD (gastroesophageal reflux disease)    Gout 06/30/2013   Hx of dizziness    Hyperlipidemia    Hypertension    Post-operative nausea and vomiting    S/P epidural steroid injection 09/27/2016   done in back at [place]    Spinal stenosis of lumbar region    s/p spine surgery 02/12/13 with Dr Marcos Eke at [facility]   Thyroid disease    Urine incontinence    Use of cane as ambulatory aid     Past Surgical History:  Procedure Laterality Date   [place]  complete hysterectomy for cervical cancer   BACK SURGERY  01/2013   BACK SURGERY  1995   CATARACT EXTRACTION, BILATERAL     with lens implant    CERVICAL CONE BIOPSY  1970   COLONOSCOPY     EP IMPLANTABLE DEVICE N/A 02/15/2016   Procedure: Loop Recorder Insertion;  Surgeon: Sanda Klein, MD;  Location: [facility];  Service: Cardiovascular;  Laterality: N/A;   POLYPECTOMY     WRIST SURGERY  2010   for wrist fracture    FAMHx:  Family History  Problem Relation Age of Onset   Alcohol abuse Father    Cirrhosis Father    Heart attack Brother    Breast cancer Sister         Reports all family members with breast cancer have had genetic testing that was negative   Cancer Mother        renal cancer   Stroke Paternal Grandmother    Alcohol abuse Brother    Breast cancer Sister        renal cancer   Diabetes Brother    Heart Problems Daughter    Healthy Daughter    Healthy Daughter    Healthy Son    Healthy Son    Arthritis Other        parents   Hyperlipidemia Other        parent   Hypertension Other        parent/grandparent   Diabetes Other        parent/grandparent   Heart disease Other    Mental illness Other    Colon cancer Neg Hx    Esophageal cancer Neg Hx    Prostate cancer Neg Hx    Rectal cancer Neg Hx    Seizures Neg Hx    Colon polyps Neg Hx     SOCHx:   reports that she quit smoking about 54 years ago. She has never used smokeless tobacco. She reports current alcohol use. She reports that she does not use drugs.  ALLERGIES:  Allergies  Allergen Reactions   Advil [Ibuprofen]     Was taking this for pain and had some ? Swollen lips   Lyrica [Pregabalin]     Extreme swelling legs    MEDS:  Current Meds  Medication Sig   ACCU-CHEK AVIVA PLUS test strip USE AS INSTRUCTED TO CHECK BLOOD SUGAR TWICE A DAY * E11.9*   ACCU-CHEK FASTCLIX LANCETS MISC USE AS DIRECTED   acetaminophen (TYLENOL) 500 MG tablet Take 1,000 mg by mouth every 8 (eight) hours as needed.   amLODipine (NORVASC) 5 MG tablet Take 1 tablet by mouth once daily   atorvastatin (LIPITOR) 40 MG tablet Take 40 mg by mouth daily.   B-D UF III MINI PEN NEEDLES 31G X 5 MM MISC USE TO INJECT INSULIN TWICE A DAY   Blood Glucose Calibration (ACCU-CHEK AVIVA) SOLN Use as directed.   calcium-vitamin D (OSCAL 500/200 D-3) 500-200 MG-UNIT per tablet Take 1 tablet by mouth daily.   cloNIDine (CATAPRES) 0.1 MG tablet TAKE 1 TABLET BY MOUTH TWICE A DAY   ferrous sulfate 325 (65 FE) MG tablet Take 325 mg by mouth daily with breakfast.    gabapentin (NEURONTIN) 100 MG capsule Take 1 capsule (100 mg total) by mouth at bedtime.   hydrALAZINE (APRESOLINE) 50 MG tablet Take 1 tablet (50 mg total) by mouth 3 (three) times daily.   Insulin Lispro Prot & Lispro (HUMALOG 75/25 MIX) (75-25) 100 UNIT/ML  Kwikpen Inject 16 Units into the skin 2 (two) times daily before a meal.   irbesartan (AVAPRO) 75 MG tablet Take 2 tablets (150 mg total) by mouth daily.   isosorbide dinitrate (ISORDIL) 20 MG tablet TAKE 1 TABLET (20 MG TOTAL) BY MOUTH 2 (TWO) TIMES DAILY.   Lancet Devices (ACCU-CHEK SOFTCLIX) lancets Use as instructed twice a day   levothyroxine (SYNTHROID) 125 MCG tablet TAKE 1 TABLET BY MOUTH EVERY DAY   metFORMIN (GLUCOPHAGE) 1000 MG tablet TAKE 1 TABLET BY MOUTH TWICE DAILY WITH MEALS   metFORMIN (GLUCOPHAGE) 1000 MG tablet TAKE 1 TABLET BY MOUTH TWICE DAILY WITH MEALS   Sennosides-Docusate Sodium (STOOL SOFTENER & LAXATIVE PO) Take 1 tablet by mouth daily as needed (constipation).    vitamin B-12 (CYANOCOBALAMIN) 1000 MCG tablet Take by mouth.   [DISCONTINUED] diltiazem (DILACOR XR) 120 MG 24 hr capsule Take 1 capsule (120 mg total) by mouth daily.   Current Facility-Administered Medications for the 05/23/19 encounter (Telemedicine) with Pixie Casino, MD  Medication   0.9 %  sodium chloride infusion   ipratropium-albuterol (DUONEB) 0.5-2.5 (3) MG/3ML nebulizer solution 3 mL     ROS: A comprehensive review of systems was negative.  Labs/Other Tests and Data Reviewed:    Recent Labs: 11/12/2018: TSH 2.87 03/06/2019: BUN 22; Creatinine, Ser 0.90; Hemoglobin 12.5; Platelets 298.0; Potassium 4.6; Sodium 138   Recent Lipid Panel Lab Results  Component Value Date/Time   CHOL 131 03/06/2019 02:41 PM   TRIG 72.0 03/06/2019 02:41 PM   HDL 65.50 03/06/2019 02:41 PM   CHOLHDL 2 03/06/2019 02:41 PM   LDLCALC 51 03/06/2019 02:41 PM    Wt Readings from Last 3 Encounters:  03/07/19 253 lb (114.8 kg)  03/06/19 253 lb  (114.8 kg)  03/06/19 253 lb 1.6 oz (114.8 kg)     Exam:    Vital Signs:  There were no vitals taken for this visit.   General appearance: alert, no distress and morbidly obese Lungs: no visual respiratory difficulty Abdomen: obese Extremities: extremities normal, atraumatic, no cyanosis or edema Skin: Skin color, texture, turgor normal. No rashes or lesions Neurologic: Mental status: Alert, oriented, thought content appropriate  ASSESSMENT & PLAN:    1. Uncontrolled hypertension 2. Paroxysmal atrial tachycardia/PACs 3. Progressive dyspnea and exertion -LVEF 60 to 65%, moderate LVH, grade 1 diastolic dysfunction  4. morbid obesity 5. Dyslipidemia 6. Insulin-dependent diabetes 7. Excessive daytime sleepiness-no significant apnea, but possible narcolepsy  Ms. Grega has persistently uncontrolled hypertension based on her most recent office visit in March but does not have a way to monitor blood pressure at home.  She is now on 2 different types of calcium channel blocker, not purposefully, therefore I will go ahead and discontinue diltiazem and switch her to carvedilol 6.25 mg twice daily.  Hopefully she will give her better blood pressure control and still help suppress her atrial tachycardias.  She continues to have significant fatigue.  This may be due to clonidine although I felt that she could also have a narcolepsy.  I did switch her to atorvastatin 40 mg nightly and her lipids are now better controlled.  Total cholesterol 131, triglycerides 72, HDL 65 and LDL 51.  Plan follow-up with me in 2 months in the office for blood pressure check.  COVID-19 Education: The signs and symptoms of COVID-19 were discussed with the patient and how to seek care for testing (follow up with PCP or arrange E-visit).  The importance of social distancing was discussed today.  Patient Risk:   After full review of this patients clinical status, I feel that they are at least moderate risk at this  time.  Time:   Today, I have spent 25 minutes with the patient with telehealth technology discussing hypertension, dyslipidemia, fatigue, weight loss, paroxysmal atrial tachycardia.     Medication Adjustments/Labs and Tests Ordered: Current medicines are reviewed at length with the patient today.  Concerns regarding medicines are outlined above.   Tests Ordered: No orders of the defined types were placed in this encounter.   Medication Changes: Meds ordered this encounter  Medications   carvedilol (COREG) 6.25 MG tablet    Sig: Take 1 tablet (6.25 mg total) by mouth 2 (two) times daily.    Dispense:  180 tablet    Refill:  3    Disposition:  in 2 month(s)  Pixie Casino, MD, [facility], [place] Director of the Advanced Lipid Disorders &  Cardiovascular Risk Reduction Clinic Diplomate of the American Board of Clinical Lipidology Attending Cardiologist  Direct Dial: [phone]   Fax: [phone]  Website:  [email or web address]  Pixie Casino, MD  05/23/2019 10:36 AM

## 2019-05-23 NOTE — Patient Instructions (Signed)
Medication Instructions:  STOP diltiazem START carvedilol 6.25mg  twice daily CONTINUE other current medications  If you need a refill on your cardiac medications before your next appointment, please call your pharmacy.    Follow-Up: At [facility], you and your health needs are our priority.  As part of our continuing mission to provide you with exceptional heart care, we have created designated Provider Care Teams.  These Care Teams include your primary Cardiologist (physician) and Advanced Practice Providers (APPs -  Physician Assistants and Nurse Practitioners) who all work together to provide you with the care you need, when you need it. You will need a follow up appointment in 2 months in the office.  You may see Dr. Debara Pickett or one of the following Advanced Practice Providers on your designated Care Team: Almyra Deforest, [place] . Fabian Sharp, PA-C

## 2019-05-23 NOTE — Telephone Encounter (Signed)
Patient called to review e-visit instructions. Patient aware that the following changes have been made: stop diltiazem, start coreg 6.25mg  BID Patient aware that they will need the following labs: NONE Patient aware that they will need the following test(s): NONE  Recall for N/A entered.  Patient aware scheduler to contact for 2 month in-office visit with MD or APP  No further assistance needed at this time.

## 2019-05-24 ENCOUNTER — Telehealth: Payer: Self-pay | Admitting: Internal Medicine

## 2019-05-24 ENCOUNTER — Other Ambulatory Visit: Payer: Medicare Other

## 2019-05-24 DIAGNOSIS — Z20822 Contact with and (suspected) exposure to covid-19: Secondary | ICD-10-CM

## 2019-05-24 NOTE — Telephone Encounter (Signed)
Called pt to regarding possible exposure to covid-19 and to schedule an appointment for testing. Pt agreed to have testing done today at the [facility] testing site at 11 am. Advised that this is a drive thru testing site and to wear a mask, stay in the care with windows rolled up until time for testing. Pt voiced understanding.

## 2019-05-26 LAB — NOVEL CORONAVIRUS, NAA: SARS-CoV-2, NAA: NOT DETECTED

## 2019-05-27 ENCOUNTER — Ambulatory Visit: Payer: Medicare Other

## 2019-05-28 ENCOUNTER — Encounter: Payer: Self-pay | Admitting: Internal Medicine

## 2019-05-28 NOTE — Progress Notes (Deleted)
Name: Katie Mcintyre  MRN/ DOB: [id number], November 26, 1941   Age/ Sex: 78 y.o., female    PCP: Billie Ruddy, MD   Reason for Endocrinology Evaluation: Type 2 Diabetes Mellitus     Date of Initial Endocrinology Visit: 05/28/2019     PATIENT IDENTIFIER: [name] is a 78 y.o. female with a past medical history of T2DM, Hypothyroidism, dyslipidemia and HTN. The patient presented for initial endocrinology clinic visit on 05/28/2019 for consultative assistance with her diabetes management.    HPI: Ms. Engen was    Diagnosed with DM *** Prior Medications tried/Intolerance: *** Currently checking blood sugars *** x / day,  before breakfast and ***.  Hypoglycemia episodes : ***               Symptoms: ***                 Frequency: ***/  Hemoglobin A1c has ranged from 6.8% in 2016, peaking at 8.6% in 2019. Patient required assistance for hypoglycemia:  Patient has required hospitalization within the last 1 year from hyper or hypoglycemia:   In terms of diet, the patient ***   HOME DIABETES REGIMEN: Metformin 1000 mg  Humalog mix    Statin: yes ACE-I/ARB: yes Prior Diabetic Education: {Yes/No:11203}   METER DOWNLOAD SUMMARY: Date range evaluated: *** Fingerstick Blood Glucose Tests = *** Average Number Tests/Day = *** Overall Mean FS Glucose = *** Standard Deviation = ***  BG Ranges: Low = *** High = ***   Hypoglycemic Events/30 Days: BG < 50 = *** Episodes of symptomatic severe hypoglycemia = ***   DIABETIC COMPLICATIONS: Microvascular complications:   ***  Denies: CKD  Last eye exam: Completed   Macrovascular complications:   ***  Denies: CAD, PVD, CVA   PAST HISTORY: Past Medical History:  Past Medical History:  Diagnosis Date  . Anemia   . Arthritis   . Cancer ([place]) 1969   , Status post complete hysterectomy per patient  . Cataract    removed both eyes with lens implant both   . Chicken pox   . Chronic bronchitis ([place])   . Colon  polyp   . Depression 06/30/2013  . Diabetes mellitus without complication ([place])   . Diverticulitis   . Frequent headaches   . GERD (gastroesophageal reflux disease)   . Gout 06/30/2013  . Hx of dizziness   . Hyperlipidemia   . Hypertension   . Post-operative nausea and vomiting   . S/P epidural steroid injection 09/27/2016   done in back at [place]   . Spinal stenosis of lumbar region    s/p spine surgery 02/12/13 with Dr Marcos Eke at [facility]  . Thyroid disease   . Urine incontinence   . Use of cane as ambulatory aid     Past Surgical History:  Past Surgical History:  Procedure Laterality Date  . ABDOMINAL HYSTERECTOMY  1970   complete hysterectomy for cervical cancer  . BACK SURGERY  01/2013  . BACK SURGERY  1995  . CATARACT EXTRACTION, BILATERAL     with lens implant   . CERVICAL CONE BIOPSY  1970  . COLONOSCOPY    . EP IMPLANTABLE DEVICE N/A 02/15/2016   Procedure: Loop Recorder Insertion;  Surgeon: Sanda Klein, MD;  Location: [facility];  Service: Cardiovascular;  Laterality: N/A;  . POLYPECTOMY    . WRIST SURGERY  2010   for wrist fracture      Social History:  reports that  she quit smoking about 54 years ago. She has never used smokeless tobacco. She reports current alcohol use. She reports that she does not use drugs. Family History:  Family History  Problem Relation Age of Onset  . Alcohol abuse Father   . Cirrhosis Father   . Heart attack Brother   . Breast cancer Sister        Reports all family members with breast cancer have had genetic testing that was negative  . Cancer Mother        renal cancer  . Stroke Paternal Grandmother   . Alcohol abuse Brother   . Breast cancer Sister        renal cancer  . Diabetes Brother   . Heart Problems Daughter   . Healthy Daughter   . Healthy Daughter   . Healthy Son   . Healthy Son   . Arthritis Other        parents  . Hyperlipidemia Other        parent  . Hypertension Other        parent/grandparent  .  Diabetes Other        parent/grandparent  . Heart disease Other   . Mental illness Other   . Colon cancer Neg Hx   . Esophageal cancer Neg Hx   . Prostate cancer Neg Hx   . Rectal cancer Neg Hx   . Seizures Neg Hx   . Colon polyps Neg Hx       HOME MEDICATIONS: Allergies as of 05/29/2019      Reactions   Advil [ibuprofen]    Was taking this for pain and had some ? Swollen lips   Lyrica [pregabalin]    Extreme swelling legs      Medication List       Accurate as of May 28, 2019  3:51 PM. If you have any questions, ask your nurse or doctor.        Accu-Chek Aviva Plus test strip Generic drug:  glucose blood USE AS INSTRUCTED TO CHECK BLOOD SUGAR TWICE A DAY * E11.9*   Accu-Chek Aviva Soln Use as directed.   Accu-Chek FastClix Lancets Misc USE AS DIRECTED   accu-chek softclix lancets Use as instructed twice a day   acetaminophen 500 MG tablet Commonly known as:  TYLENOL Take 1,000 mg by mouth every 8 (eight) hours as needed.   amLODipine 5 MG tablet Commonly known as:  NORVASC Take 1 tablet by mouth once daily   atorvastatin 40 MG tablet Commonly known as:  LIPITOR Take 40 mg by mouth daily.   B-D UF III MINI PEN NEEDLES 31G X 5 MM Misc Generic drug:  Insulin Pen Needle USE TO INJECT INSULIN TWICE A DAY   carvedilol 6.25 MG tablet Commonly known as:  COREG Take 1 tablet (6.25 mg total) by mouth 2 (two) times daily.   cloNIDine 0.1 MG tablet Commonly known as:  CATAPRES TAKE 1 TABLET BY MOUTH TWICE A DAY   ferrous sulfate 325 (65 FE) MG tablet Take 325 mg by mouth daily with breakfast.   gabapentin 100 MG capsule Commonly known as:  NEURONTIN Take 1 capsule (100 mg total) by mouth at bedtime.   hydrALAZINE 50 MG tablet Commonly known as:  APRESOLINE Take 1 tablet (50 mg total) by mouth 3 (three) times daily.   Insulin Lispro Prot & Lispro (75-25) 100 UNIT/ML Kwikpen Commonly known as:  HUMALOG 75/25 MIX Inject 16 Units into the skin 2 (two)  times  daily before a meal.   irbesartan 75 MG tablet Commonly known as:  AVAPRO Take 2 tablets (150 mg total) by mouth daily.   isosorbide dinitrate 20 MG tablet Commonly known as:  ISORDIL TAKE 1 TABLET (20 MG TOTAL) BY MOUTH 2 (TWO) TIMES DAILY.   levothyroxine 125 MCG tablet Commonly known as:  SYNTHROID TAKE 1 TABLET BY MOUTH EVERY DAY   metFORMIN 1000 MG tablet Commonly known as:  GLUCOPHAGE TAKE 1 TABLET BY MOUTH TWICE DAILY WITH MEALS   metFORMIN 1000 MG tablet Commonly known as:  GLUCOPHAGE TAKE 1 TABLET BY MOUTH TWICE DAILY WITH MEALS   Oscal 500/200 D-3 500-200 MG-UNIT tablet Generic drug:  calcium-vitamin D Take 1 tablet by mouth daily.   STOOL SOFTENER & LAXATIVE PO Take 1 tablet by mouth daily as needed (constipation).   vitamin B-12 1000 MCG tablet Commonly known as:  CYANOCOBALAMIN Take by mouth.        ALLERGIES: Allergies  Allergen Reactions  . Advil [Ibuprofen]     Was taking this for pain and had some ? Swollen lips  . Lyrica [Pregabalin]     Extreme swelling legs     REVIEW OF SYSTEMS: A comprehensive ROS was conducted with the patient and is negative except as per HPI and below:  ROS    OBJECTIVE:   VITAL SIGNS: There were no vitals taken for this visit.   PHYSICAL EXAM:  General: Pt appears well and is in NAD  Hydration: Well-hydrated with moist mucous membranes and good skin turgor  HEENT: Head: Unremarkable with good dentition. Oropharynx clear without exudate.  Eyes: External eye exam normal without stare, lid lag or exophthalmos.  EOM intact.  PERRL.  Neck: General: Supple without adenopathy or carotid bruits. Thyroid: Thyroid size normal.  No goiter or nodules appreciated. No thyroid bruit.  Lungs: Clear with good BS bilat with no rales, rhonchi, or wheezes  Heart: RRR with normal S1 and S2 and no gallops; no murmurs; no rub  Abdomen: Normoactive bowel sounds, soft, nontender, without masses or organomegaly palpable   Extremities:  Lower extremities - No pretibial edema. No lesions.  Skin: Normal texture and temperature to palpation. No rash noted. No Acanthosis nigricans/skin tags. No lipohypertrophy.  Neuro: MS is good with appropriate affect, pt is alert and Ox3    DM foot exam:    DATA REVIEWED:  Lab Results  Component Value Date   HGBA1C 8.5 (H) 03/06/2019   HGBA1C 8.6 (H) 11/12/2018   HGBA1C 7.9 (H) 07/08/2018   Lab Results  Component Value Date   MICROALBUR 21.4 (H) 09/08/2014   LDLCALC 51 03/06/2019   CREATININE 0.90 03/06/2019   Lab Results  Component Value Date   MICRALBCREAT 10.3 09/08/2014    Lab Results  Component Value Date   CHOL 131 03/06/2019   HDL 65.50 03/06/2019   LDLCALC 51 03/06/2019   TRIG 72.0 03/06/2019   CHOLHDL 2 03/06/2019        ASSESSMENT / PLAN / RECOMMENDATIONS:   1) Type 2 Diabetes Mellitus, poorly controlled, With*** complications - Most recent A1c of *** %. Goal A1c < *** %.  ***  Plan: GENERAL:  ***  MEDICATIONS:  ***  EDUCATION / INSTRUCTIONS:  BG monitoring instructions: Patient is instructed to check her blood sugars *** times a day, ***.  Call [facility] if: BG persistently < 70 or > 300. . I reviewed the Rule of 15 for the treatment of hypoglycemia in detail with the patient.  Literature supplied.   2) Diabetic complications:   Eye: Does *** have known diabetic retinopathy.   Neuro/ Feet: Does *** have known diabetic peripheral neuropathy.  Renal: Patient does *** have known baseline CKD. She is *** on an ACEI/ARB at present.Check urine albumin/creatinine ratio yearly starting at time of diagnosis. If albuminuria is positive, treatment is geared toward better glucose, blood pressure control and use of ACE inhibitors or ARBs. Monitor electrolytes and creatinine once to twice yearly.   3) Lipids: Patient is *** on a statin.    4) Hypertension: ***  at goal of < 140/90 mmHg.       Signed  electronically by: Mack Guise, MD  [facility]  [facility] [address] [place], [place] [address] Phone: [phone] FAX: [phone]   CC: Billie Ruddy, MD [place] [address] Phone: [phone]  Fax: [phone]    Return to Endocrinology clinic as below: Future Appointments  Date Time Provider [place]  05/29/2019 10:30 AM Shamleffer, Melanie Crazier, MD [facility] None  05/30/2019 11:15 AM Pearson Forster, PT [facility] [place]  06/03/2019 11:15 AM Pearson Forster, PT [facility] [place]  06/04/2019 11:00 AM [facility]  06/05/2019 11:00 AM Pearson Forster, PT [facility] [place]  06/10/2019 11:15 AM Pearson Forster, PT [facility] [place]  06/12/2019 11:00 AM Pearson Forster, PT [facility]  06/16/2019  9:35 AM [facility] DEVICE REMOTES [facility]  06/17/2019 11:15 AM Pearson Forster, PT [facility] [place]  06/19/2019 11:00 AM Pearson Forster, PT [facility]  08/11/2019 10:45 AM Marzetta Board, DPM [facility]  08/26/2019 11:45 AM Hilty, Nadean Corwin, MD [facility]

## 2019-05-29 ENCOUNTER — Ambulatory Visit: Payer: Medicare Other | Admitting: Internal Medicine

## 2019-05-30 ENCOUNTER — Ambulatory Visit: Payer: Medicare Other

## 2019-06-03 ENCOUNTER — Ambulatory Visit: Payer: Medicare Other

## 2019-06-04 ENCOUNTER — Other Ambulatory Visit (INDEPENDENT_AMBULATORY_CARE_PROVIDER_SITE_OTHER): Payer: Medicare Other

## 2019-06-04 ENCOUNTER — Other Ambulatory Visit: Payer: Self-pay

## 2019-06-04 DIAGNOSIS — E039 Hypothyroidism, unspecified: Secondary | ICD-10-CM | POA: Diagnosis not present

## 2019-06-04 DIAGNOSIS — R5383 Other fatigue: Secondary | ICD-10-CM | POA: Diagnosis not present

## 2019-06-04 LAB — CBC
HCT: 35.4 % — ABNORMAL LOW (ref 36.0–46.0)
Hemoglobin: 11.7 g/dL — ABNORMAL LOW (ref 12.0–15.0)
MCHC: 33.1 g/dL (ref 30.0–36.0)
MCV: 87.6 fl (ref 78.0–100.0)
Platelets: 273 10*3/uL (ref 150.0–400.0)
RBC: 4.04 Mil/uL (ref 3.87–5.11)
RDW: 14.7 % (ref 11.5–15.5)
WBC: 5.2 10*3/uL (ref 4.0–10.5)

## 2019-06-04 LAB — TSH: TSH: 2.22 u[IU]/mL (ref 0.35–4.50)

## 2019-06-04 LAB — VITAMIN D 25 HYDROXY (VIT D DEFICIENCY, FRACTURES): VITD: 32.89 ng/mL (ref 30.00–100.00)

## 2019-06-05 ENCOUNTER — Ambulatory Visit: Payer: Medicare Other

## 2019-06-05 MED ORDER — TRIAMCINOLONE ACETONIDE 40 MG/ML IJ SUSP
60.0000 mg | INTRAMUSCULAR | Status: AC | PRN
  Administered 2019-05-07: 60 mg via INTRA_ARTICULAR

## 2019-06-05 MED ORDER — BUPIVACAINE HCL 0.25 % IJ SOLN
4.0000 mL | INTRAMUSCULAR | Status: AC | PRN
  Administered 2019-05-07: 4 mL via INTRA_ARTICULAR

## 2019-06-10 ENCOUNTER — Ambulatory Visit: Payer: Medicare Other

## 2019-06-12 ENCOUNTER — Telehealth: Payer: Self-pay | Admitting: Physical Therapy

## 2019-06-12 ENCOUNTER — Ambulatory Visit: Payer: Medicare Other | Attending: Specialist

## 2019-06-12 NOTE — Telephone Encounter (Signed)
Spoke with Katie Mcintyre and she reported she wanted all PT sessions to be canceled and was agreeable to discharge feeling she cannot travel due to  back pain. She feels she needs a back injection. We will discharge for now and if she feels better and able to come in  She will ask the MD for a  new order.

## 2019-06-16 ENCOUNTER — Ambulatory Visit (INDEPENDENT_AMBULATORY_CARE_PROVIDER_SITE_OTHER): Payer: Medicare Other | Admitting: *Deleted

## 2019-06-16 DIAGNOSIS — R55 Syncope and collapse: Secondary | ICD-10-CM | POA: Diagnosis not present

## 2019-06-16 LAB — CUP PACEART REMOTE DEVICE CHECK
Date Time Interrogation Session: 20200621180843
Implantable Pulse Generator Implant Date: 20170221

## 2019-06-17 ENCOUNTER — Ambulatory Visit: Payer: Medicare Other

## 2019-06-19 ENCOUNTER — Ambulatory Visit: Payer: Medicare Other

## 2019-06-26 NOTE — Progress Notes (Signed)
Carelink Summary Report / Loop Recorder 

## 2019-06-30 ENCOUNTER — Other Ambulatory Visit: Payer: Self-pay

## 2019-07-02 ENCOUNTER — Encounter: Payer: Self-pay | Admitting: Internal Medicine

## 2019-07-02 ENCOUNTER — Ambulatory Visit (INDEPENDENT_AMBULATORY_CARE_PROVIDER_SITE_OTHER): Payer: Medicare Other | Admitting: Internal Medicine

## 2019-07-02 ENCOUNTER — Other Ambulatory Visit: Payer: Self-pay

## 2019-07-02 VITALS — BP 128/62 | HR 69 | Temp 97.6°F | Ht 61.0 in | Wt 258.8 lb

## 2019-07-02 DIAGNOSIS — E1159 Type 2 diabetes mellitus with other circulatory complications: Secondary | ICD-10-CM | POA: Diagnosis not present

## 2019-07-02 DIAGNOSIS — E1165 Type 2 diabetes mellitus with hyperglycemia: Secondary | ICD-10-CM | POA: Diagnosis not present

## 2019-07-02 DIAGNOSIS — E1142 Type 2 diabetes mellitus with diabetic polyneuropathy: Secondary | ICD-10-CM | POA: Diagnosis not present

## 2019-07-02 DIAGNOSIS — Z794 Long term (current) use of insulin: Secondary | ICD-10-CM

## 2019-07-02 DIAGNOSIS — E11319 Type 2 diabetes mellitus with unspecified diabetic retinopathy without macular edema: Secondary | ICD-10-CM | POA: Diagnosis not present

## 2019-07-02 DIAGNOSIS — L03115 Cellulitis of right lower limb: Secondary | ICD-10-CM | POA: Insufficient documentation

## 2019-07-02 LAB — POCT GLYCOSYLATED HEMOGLOBIN (HGB A1C): Hemoglobin A1C: 7.1 % — AB (ref 4.0–5.6)

## 2019-07-02 LAB — GLUCOSE, POCT (MANUAL RESULT ENTRY): POC Glucose: 93 mg/dl (ref 70–99)

## 2019-07-02 MED ORDER — INSULIN LISPRO PROT & LISPRO (75-25 MIX) 100 UNIT/ML KWIKPEN: 12.0000 [IU] | PEN_INJECTOR | Freq: Two times a day (BID) | SUBCUTANEOUS | 11 refills | Status: DC

## 2019-07-02 MED ORDER — AMOXICILLIN-POT CLAVULANATE 875-125 MG PO TABS: 1.0000 | ORAL_TABLET | Freq: Two times a day (BID) | ORAL | 0 refills | Status: DC

## 2019-07-02 NOTE — Patient Instructions (Signed)
-   Decrease Novolog Mix to 12 units Twice a day with Breakfast and Supper - Continue Metformin 1000 mg, 1 Tablet twice a day    - Check sugar before Breakfast and Before Supper    - HOW TO TREAT LOW BLOOD SUGARS (Blood sugar LESS THAN 70 MG/DL)  Please follow the RULE OF 15 for the treatment of hypoglycemia treatment (when your (blood sugars are less than 70 mg/dL)    STEP 1: Take 15 grams of carbohydrates when your blood sugar is low, which includes:   3-4 GLUCOSE TABS  OR  3-4 OZ OF JUICE OR REGULAR SODA OR  ONE TUBE OF GLUCOSE GEL     STEP 2: RECHECK blood sugar in 15 MINUTES STEP 3: If your blood sugar is still low at the 15 minute recheck --> then, go back to STEP 1 and treat AGAIN with another 15 grams of carbohydrates.

## 2019-07-02 NOTE — Progress Notes (Signed)
Name: Katie Mcintyre  MRN/ DOB: [id number], [birth year]   Age/ Sex: 78 y.o., female    PCP: Billie Ruddy, MD   Reason for Endocrinology Evaluation: Type 2 Diabetes Mellitus     Date of Initial Endocrinology Visit: 07/02/2019     PATIENT IDENTIFIER: Katie Mcintyre is a 78 y.o. female with a past medical history of T2DM, Dyslipidemia, PAF and chronic pain syndrome. The patient presented for initial endocrinology clinic visit on 07/02/2019 for consultative assistance with her diabetes management.    HPI: Katie Mcintyre was    Diagnosed with T2DM for many year  Prior Medications tried/Intolerance: n/a Currently checking blood sugars 1 x / day,  before breakfast Hypoglycemia episodes : 0             Hemoglobin A1c has ranged from 6.8% in 2017, peaking at 8.5% in 2020. Patient required assistance for hypoglycemia: no Patient has required hospitalization within the last 1 year from hyper or hypoglycemia: no  In terms of diet, the patient drinks juice, eats 3 meals a day with minimal snacking   Today she is c/o right LE erythema and swelling , the pain is noted at night.     She has chronic pain syndrome   HOME DIABETES REGIMEN: Humalog Mix 14-15 units BID After breakfast and supper  Metformin 1000 mg BID     Statin: yes ACE-I/ARB: yes Prior Diabetic Education: Yes   METER DOWNLOAD SUMMARY: Did not bring  Yesterday fasting 74 mg/dL  Today fasting 94 mg/dL    DIABETIC COMPLICATIONS: Microvascular complications:   Right eye retinopathy, hx of treatment of left eye when living in [place]  Denies: CKD,    Last eye exam: Completed 06/2019  Macrovascular complications:    Denies: CAD, PVD, CVA   PAST HISTORY: Past Medical History:  Past Medical History:  Diagnosis Date  . Anemia   . Arthritis   . Cancer ([place]) 1969   , Status post complete hysterectomy per patient  . Cataract    removed both eyes with lens implant both   . Chicken pox   . Chronic  bronchitis ([place])   . Colon polyp   . Depression 06/30/2013  . Diabetes mellitus without complication ([place])   . Diverticulitis   . Frequent headaches   . GERD (gastroesophageal reflux disease)   . Gout 06/30/2013  . Hx of dizziness   . Hyperlipidemia   . Hypertension   . Post-operative nausea and vomiting   . S/P epidural steroid injection 09/27/2016   done in back at [place]   . Spinal stenosis of lumbar region    s/p spine surgery 02/12/13 with Dr Marcos Eke at [facility]  . Thyroid disease   . Urine incontinence   . Use of cane as ambulatory aid    Past Surgical History:  Past Surgical History:  Procedure Laterality Date  . ABDOMINAL HYSTERECTOMY  1970   complete hysterectomy for cervical cancer  . BACK SURGERY  01/2013  . BACK SURGERY  1995  . CATARACT EXTRACTION, BILATERAL     with lens implant   . CERVICAL CONE BIOPSY  1970  . COLONOSCOPY    . EP IMPLANTABLE DEVICE N/A 02/15/2016   Procedure: Loop Recorder Insertion;  Surgeon: Sanda Klein, MD;  Location: [facility];  Service: Cardiovascular;  Laterality: N/A;  . POLYPECTOMY    . WRIST SURGERY  2010   for wrist fracture      Social History:  reports that she  quit smoking about 54 years ago. She has never used smokeless tobacco. She reports current alcohol use. She reports that she does not use drugs. Family History:  Family History  Problem Relation Age of Onset  . Alcohol abuse Father   . Cirrhosis Father   . Heart attack Brother   . Breast cancer Sister        Reports all family members with breast cancer have had genetic testing that was negative  . Cancer Mother        renal cancer  . Stroke Paternal Grandmother   . Alcohol abuse Brother   . Breast cancer Sister        renal cancer  . Diabetes Brother   . Heart Problems Daughter   . Healthy Daughter   . Healthy Daughter   . Healthy Son   . Healthy Son   . Arthritis Other        parents  . Hyperlipidemia Other        parent  . Hypertension Other         parent/grandparent  . Diabetes Other        parent/grandparent  . Heart disease Other   . Mental illness Other   . Colon cancer Neg Hx   . Esophageal cancer Neg Hx   . Prostate cancer Neg Hx   . Rectal cancer Neg Hx   . Seizures Neg Hx   . Colon polyps Neg Hx      HOME MEDICATIONS: Allergies as of 07/02/2019      Reactions   Advil [ibuprofen]    Was taking this for pain and had some ? Swollen lips   Lyrica [pregabalin]    Extreme swelling legs      Medication List       Accurate as of July 02, 2019  1:02 PM. If you have any questions, ask your nurse or doctor.        Accu-Chek Aviva Plus test strip Generic drug: glucose blood USE AS INSTRUCTED TO CHECK BLOOD SUGAR TWICE A DAY * E11.9*   Accu-Chek Aviva Soln Use as directed.   Accu-Chek FastClix Lancets Misc USE AS DIRECTED   accu-chek softclix lancets Use as instructed twice a day   acetaminophen 500 MG tablet Commonly known as: TYLENOL Take 1,000 mg by mouth every 8 (eight) hours as needed.   amLODipine 5 MG tablet Commonly known as: NORVASC Take 1 tablet by mouth once daily   amoxicillin-clavulanate 875-125 MG tablet Commonly known as: AUGMENTIN Take 1 tablet by mouth 2 (two) times daily. Started by: Dorita Sciara, MD   atorvastatin 40 MG tablet Commonly known as: LIPITOR Take 40 mg by mouth daily.   B-D UF III MINI PEN NEEDLES 31G X 5 MM Misc Generic drug: Insulin Pen Needle USE TO INJECT INSULIN TWICE A DAY   carvedilol 6.25 MG tablet Commonly known as: COREG Take 1 tablet (6.25 mg total) by mouth 2 (two) times daily.   cloNIDine 0.1 MG tablet Commonly known as: CATAPRES TAKE 1 TABLET BY MOUTH TWICE A DAY   ferrous sulfate 325 (65 FE) MG tablet Take 325 mg by mouth daily with breakfast.   gabapentin 100 MG capsule Commonly known as: NEURONTIN Take 1 capsule (100 mg total) by mouth at bedtime.   hydrALAZINE 50 MG tablet Commonly known as: APRESOLINE Take 1 tablet (50 mg total)  by mouth 3 (three) times daily.   Insulin Lispro Prot & Lispro (75-25) 100 UNIT/ML Kwikpen Commonly known  as: HUMALOG 75/25 MIX Inject 12 Units into the skin 2 (two) times daily before a meal. What changed: how much to take Changed by: Dorita Sciara, MD   irbesartan 75 MG tablet Commonly known as: AVAPRO Take 2 tablets (150 mg total) by mouth daily.   isosorbide dinitrate 20 MG tablet Commonly known as: ISORDIL TAKE 1 TABLET (20 MG TOTAL) BY MOUTH 2 (TWO) TIMES DAILY.   levothyroxine 125 MCG tablet Commonly known as: SYNTHROID TAKE 1 TABLET BY MOUTH EVERY DAY What changed:   how to take this  when to take this   metFORMIN 1000 MG tablet Commonly known as: GLUCOPHAGE TAKE 1 TABLET BY MOUTH TWICE DAILY WITH MEALS What changed: Another medication with the same name was removed. Continue taking this medication, and follow the directions you see here. Changed by: Dorita Sciara, MD   Oscal 500/200 D-3 500-200 MG-UNIT tablet Generic drug: calcium-vitamin D Take 1 tablet by mouth daily.   STOOL SOFTENER & LAXATIVE PO Take 1 tablet by mouth daily as needed (constipation).   vitamin B-12 1000 MCG tablet Commonly known as: CYANOCOBALAMIN Take by mouth.        ALLERGIES: Allergies  Allergen Reactions  . Advil [Ibuprofen]     Was taking this for pain and had some ? Swollen lips  . Lyrica [Pregabalin]     Extreme swelling legs     REVIEW OF SYSTEMS: A comprehensive ROS was conducted with the patient and is negative except as per HPI and below:  Review of Systems  Constitutional: Negative for fever and weight loss.  HENT: Negative for congestion and sore throat.   Eyes: Positive for blurred vision. Negative for pain.  Respiratory: Positive for shortness of breath. Negative for cough.   Cardiovascular: Positive for leg swelling. Negative for chest pain.  Gastrointestinal: Positive for constipation. Negative for nausea.  Genitourinary: Positive for  frequency.  Skin: Negative.   Neurological: Negative for tingling and tremors.  Endo/Heme/Allergies: Positive for polydipsia.  Psychiatric/Behavioral: Negative for depression. The patient is not nervous/anxious.       OBJECTIVE:   VITAL SIGNS: BP 128/62 (BP Location: Left Arm, Patient Position: Sitting, Cuff Size: Large)   Pulse 69   Temp 97.6 F (36.4 C)   Ht 5\' 1"  (1.549 m)   Wt 258 lb 12.8 oz (117.4 kg)   SpO2 98%   BMI 48.90 kg/m    PHYSICAL EXAM:  General: Pt appears well and is in NAD  Hydration: Well-hydrated with moist mucous membranes and good skin turgor  HEENT: Head: Unremarkable Eyes: External eye exam normal without stare, lid lag or exophthalmos.  EOM intact.   Neck: General: Supple without adenopathy or carotid bruits. Thyroid: Thyroid size normal.  No goiter or nodules appreciated. No thyroid bruit.  Lungs: Clear with good BS bilat with no rales, rhonchi, or wheezes  Heart: RRR with normal S1 and S2 and no gallops; no murmurs; no rub  Abdomen: Normoactive bowel sounds, soft, nontender, without masses or organomegaly palpable  Extremities:  Lower extremities - 1+ pretibial edema. Right distal shin swelling, erythema with warmth , a few dried scabs  Skin: Normal texture and temperature to palpation. No rash noted. No Acanthosis nigricans/skin tags. No lipohypertrophy.  Neuro: MS is good with appropriate affect, pt is alert and Ox3    DM foot exam: 07/02/2019  The skin of the feet is intact without sores or ulcerations. The pedal pulses are 2+ on right and 2+ on left. The sensation  is decreased to a screening 5.07, 10 gram monofilament bilaterally   DATA REVIEWED:  Lab Results  Component Value Date   HGBA1C 7.1 (A) 07/02/2019   HGBA1C 8.5 (H) 03/06/2019   HGBA1C 8.6 (H) 11/12/2018   Lab Results  Component Value Date   MICROALBUR 21.4 (H) 09/08/2014   LDLCALC 51 03/06/2019   CREATININE 0.90 03/06/2019   Lab Results  Component Value Date    MICRALBCREAT 10.3 09/08/2014    Lab Results  Component Value Date   CHOL 131 03/06/2019   HDL 65.50 03/06/2019   LDLCALC 51 03/06/2019   TRIG 72.0 03/06/2019   CHOLHDL 2 03/06/2019        ASSESSMENT / PLAN / RECOMMENDATIONS:   1) Type 2 Diabetes Mellitus, Suboptimally controlled, With Neuropathic, and retinopathic complications - Most recent A1c of 7.1 %. Goal A1c < 7.0 %.    Plan: GENERAL:  Her glycemic control has improved, pt does have dietary indiscretions, as well as intermittent intra-articular injections which will drive her glucose higher, despite concerns with insulin cost, she denies having issues with access to medications.  Pt also admits to forgetfulness at times and would question herself if she took insulin or not, she understands that taking it twice is dangerous.   We discussed the importance of lifestyle changes in keeping medications and glucose under control  Given that she is having tight BG's in the 70's and 90's, I will reduce her insulin as below  We did discuss add on therapy to help reduce insulin dosing but she is comfortable with current regimen   She tends to adjust her insulin based on if she is going to eat cookies or ice cream, pt advised to limit adjustments to no more then 2 units at a time. She was also advised to take insulin before her meals , rather then after to avoid hypoglycemia Discussed pharmacokinetics of Mix insulin and the importance of taking prandial insulin with meals.    MEDICATIONS:  Decrease Humalog Mix to 12 units BID with meals   Continue Metformin 1000 mg BID   EDUCATION / INSTRUCTIONS:  BG monitoring instructions: Patient is instructed to check her blood sugars 2 times a day, before meals.  Call [facility] if: BG persistently < 70 or > 300. . I reviewed the Rule of 15 for the treatment of hypoglycemia in detail with the patient. Literature supplied.   2) Diabetic complications:   Eye: Does have  known diabetic retinopathy.   Neuro/ Feet: Does have known diabetic peripheral neuropathy, based on her exam today   Renal: Patient does not have known baseline CKD. She is  on an ACEI/ARB at present.    3) Lipids: Patient is on a statin.    4) Right LE Cellulitis:   - Pt has evidence of cellulitis on the right shin with erythema, swelling and yellow scabs, will treat her with Abx since she will not be able to see her PCP  At this time.   Medications Augmentin 875 mg BID x 10 days   F/u in 3 months    Signed electronically by: Mack Guise, MD  [facility]  [address] [place], [place] [address] Phone: [phone] FAX: [phone]   CC: Billie Ruddy, [address] [place] [address] Phone: [phone]  Fax: [phone]    Return to Endocrinology clinic as below: Future Appointments  Date Time Provider [place]  07/18/2019  7:30  AM [facility] DEVICE REMOTES [facility]  08/11/2019 10:45 AM Marzetta Board, DPM [facility]  08/26/2019 11:45 AM Hilty, Nadean Corwin, MD [facility]  10/01/2019 10:50 AM Shamleffer, Melanie Crazier, MD [facility] None

## 2019-07-03 ENCOUNTER — Telehealth: Payer: Self-pay | Admitting: Physical Medicine and Rehabilitation

## 2019-07-03 ENCOUNTER — Ambulatory Visit (INDEPENDENT_AMBULATORY_CARE_PROVIDER_SITE_OTHER): Payer: Medicare Other | Admitting: Family Medicine

## 2019-07-03 ENCOUNTER — Encounter: Payer: Self-pay | Admitting: Family Medicine

## 2019-07-03 DIAGNOSIS — R252 Cramp and spasm: Secondary | ICD-10-CM | POA: Diagnosis not present

## 2019-07-03 DIAGNOSIS — M719 Bursopathy, unspecified: Secondary | ICD-10-CM | POA: Diagnosis not present

## 2019-07-03 DIAGNOSIS — L03115 Cellulitis of right lower limb: Secondary | ICD-10-CM | POA: Diagnosis not present

## 2019-07-03 NOTE — Telephone Encounter (Signed)
Best would OV plus bursa then two weeks for possible repeat back injection but TBD depending on visit. Not able and not prudent to do both at same time with no follow up.

## 2019-07-03 NOTE — Progress Notes (Signed)
Virtual Visit via Video Note  I connected with Katie Mcintyre on 07/03/19 at  1:30 PM EDT by a video enabled telemedicine application and verified that I am speaking with the correct person using two identifiers.  Location patient: home Location provider:work or home office Persons participating in the virtual visit: patient, provider  I discussed the limitations of evaluation and management by telemedicine and the availability of in person appointments. The patient expressed understanding and agreed to proceed.   HPI: Pt is a 78 yo female with pmh sig for DM II, PAF, HLD, hypothyroidism, lumbar disc dz with radiculopathy, venous stasis, HTN, h/o cataracts s/p removal, gout.  Pt seen yesterday by Endocrinology.  Given rx for augmentin for RLE cellulitis.   Pt has not picked up the rx.  States feeling "so bad", couldn't go get the medicine.  Pt states her leg has been red x 1 wk and oozing.  Pt has a h/o venous stasis.  Tried cortisone cream on the leg and TED hose.  Denies n/v, HA, fever, chills.  States has LE edema at the end of the day.  Drinking more water.  Having leg cramps at night/early am.  Pt having pain in back 2/2 bursitis.  States will go lay down and put heat on it after call.  States awaiting to her back from PM&R about injections.  Last injection was in May.  ROS: See pertinent positives and negatives per HPI.  Past Medical History:  Diagnosis Date  . Anemia   . Arthritis   . Cancer ([place]) 1969   , Status post complete hysterectomy per patient  . Cataract    removed both eyes with lens implant both   . Chicken pox   . Chronic bronchitis ([place])   . Colon polyp   . Depression 06/30/2013  . Diabetes mellitus without complication ([place])   . Diverticulitis   . Frequent headaches   . GERD (gastroesophageal reflux disease)   . Gout 06/30/2013  . Hx of dizziness   . Hyperlipidemia   . Hypertension   . Post-operative nausea and vomiting   . S/P epidural steroid injection  09/27/2016   done in back at [place]   . Spinal stenosis of lumbar region    s/p spine surgery 02/12/13 with Dr Marcos Eke at [facility]  . Thyroid disease   . Urine incontinence   . Use of cane as ambulatory aid     Past Surgical History:  Procedure Laterality Date  . ABDOMINAL HYSTERECTOMY  1970   complete hysterectomy for cervical cancer  . BACK SURGERY  01/2013  . BACK SURGERY  1995  . CATARACT EXTRACTION, BILATERAL     with lens implant   . CERVICAL CONE BIOPSY  1970  . COLONOSCOPY    . EP IMPLANTABLE DEVICE N/A 02/15/2016   Procedure: Loop Recorder Insertion;  Surgeon: Sanda Klein, MD;  Location: [facility];  Service: Cardiovascular;  Laterality: N/A;  . POLYPECTOMY    . WRIST SURGERY  2010   for wrist fracture    Family History  Problem Relation Age of Onset  . Alcohol abuse Father   . Cirrhosis Father   . Heart attack Brother   . Breast cancer Sister        Reports all family members with breast cancer have had genetic testing that was negative  . Cancer Mother        renal cancer  . Stroke Paternal Grandmother   . Alcohol abuse Brother   .  Breast cancer Sister        renal cancer  . Diabetes Brother   . Heart Problems Daughter   . Healthy Daughter   . Healthy Daughter   . Healthy Son   . Healthy Son   . Arthritis Other        parents  . Hyperlipidemia Other        parent  . Hypertension Other        parent/grandparent  . Diabetes Other        parent/grandparent  . Heart disease Other   . Mental illness Other   . Colon cancer Neg Hx   . Esophageal cancer Neg Hx   . Prostate cancer Neg Hx   . Rectal cancer Neg Hx   . Seizures Neg Hx   . Colon polyps Neg Hx       Current Outpatient Medications:  .  ACCU-CHEK AVIVA PLUS test strip, USE AS INSTRUCTED TO CHECK BLOOD SUGAR TWICE A DAY * E11.9*, Disp: 100 each, Rfl: 5 .  ACCU-CHEK FASTCLIX LANCETS MISC, USE AS DIRECTED, Disp: 102 each, Rfl: 5 .  acetaminophen (TYLENOL) 500 MG tablet, Take 1,000 mg by  mouth every 8 (eight) hours as needed., Disp: , Rfl:  .  amLODipine (NORVASC) 5 MG tablet, Take 1 tablet by mouth once daily, Disp: 90 tablet, Rfl: 0 .  amoxicillin-clavulanate (AUGMENTIN) 875-125 MG tablet, Take 1 tablet by mouth 2 (two) times daily., Disp: 20 tablet, Rfl: 0 .  atorvastatin (LIPITOR) 40 MG tablet, Take 40 mg by mouth daily., Disp: , Rfl:  .  B-D UF III MINI PEN NEEDLES 31G X 5 MM MISC, USE TO INJECT INSULIN TWICE A DAY, Disp: 100 each, Rfl: 3 .  Blood Glucose Calibration (ACCU-CHEK AVIVA) SOLN, Use as directed., Disp: 1 each, Rfl: 1 .  calcium-vitamin D (OSCAL 500/200 D-3) 500-200 MG-UNIT per tablet, Take 1 tablet by mouth daily., Disp: , Rfl:  .  carvedilol (COREG) 6.25 MG tablet, Take 1 tablet (6.25 mg total) by mouth 2 (two) times daily., Disp: 180 tablet, Rfl: 3 .  cloNIDine (CATAPRES) 0.1 MG tablet, TAKE 1 TABLET BY MOUTH TWICE A DAY, Disp: 180 tablet, Rfl: 0 .  ferrous sulfate 325 (65 FE) MG tablet, Take 325 mg by mouth daily with breakfast., Disp: , Rfl:  .  gabapentin (NEURONTIN) 100 MG capsule, Take 1 capsule (100 mg total) by mouth at bedtime., Disp: 90 capsule, Rfl: 3 .  hydrALAZINE (APRESOLINE) 50 MG tablet, Take 1 tablet (50 mg total) by mouth 3 (three) times daily., Disp: 270 tablet, Rfl: 1 .  Insulin Lispro Prot & Lispro (HUMALOG 75/25 MIX) (75-25) 100 UNIT/ML Kwikpen, Inject 12 Units into the skin 2 (two) times daily before a meal., Disp: 9 mL, Rfl: 11 .  irbesartan (AVAPRO) 75 MG tablet, Take 2 tablets (150 mg total) by mouth daily., Disp: 180 tablet, Rfl: 1 .  isosorbide dinitrate (ISORDIL) 20 MG tablet, TAKE 1 TABLET (20 MG TOTAL) BY MOUTH 2 (TWO) TIMES DAILY., Disp: 180 tablet, Rfl: 0 .  Lancet Devices (ACCU-CHEK SOFTCLIX) lancets, Use as instructed twice a day, Disp: 1 each, Rfl: 5 .  levothyroxine (SYNTHROID) 125 MCG tablet, TAKE 1 TABLET BY MOUTH EVERY DAY (Patient taking differently: 125 mcg. ), Disp: 90 tablet, Rfl: 0 .  metFORMIN (GLUCOPHAGE) 1000 MG  tablet, TAKE 1 TABLET BY MOUTH TWICE DAILY WITH MEALS, Disp: 180 tablet, Rfl: 0 .  Sennosides-Docusate Sodium (STOOL SOFTENER & LAXATIVE PO), Take 1 tablet by  mouth daily as needed (constipation). , Disp: , Rfl:  .  vitamin B-12 (CYANOCOBALAMIN) 1000 MCG tablet, Take by mouth., Disp: , Rfl:   Current Facility-Administered Medications:  .  0.9 %  sodium chloride infusion, 500 mL, Intravenous, Once, Pyrtle, Lajuan Lines, MD .  ipratropium-albuterol (DUONEB) 0.5-2.5 (3) MG/3ML nebulizer solution 3 mL, 3 mL, Nebulization, Once, [place], [place], FNP  EXAM:  VITALS per patient if applicable: RR between 48-27 bpm  GENERAL: alert, oriented, appears well and in no acute distress  HEENT: atraumatic, conjunctiva clear, no obvious abnormalities on inspection of external nose and ears  NECK: normal movements of the head and neck  LUNGS: on inspection no signs of respiratory distress, breathing rate appears normal, no obvious gross SOB, gasping or wheezing  CV: no obvious cyanosis  MS: R shin with erythema, drainage, edema and open sore.  moves all visible extremities without noticeable abnormality  PSYCH/NEURO: pleasant and cooperative, no obvious depression or anxiety, speech and thought processing grossly intact  ASSESSMENT AND PLAN:  Discussed the following assessment and plan:  Cellulitis of right leg  -pt advised to pick up rx and start taking Augmentin ASAP.  Pt to contact pharmacy to see if med can be delivered. -discussed elevating LEs.   -discussed the importance of glycemic control to aid with healing. -pt given precautions for worsening symptoms.    Leg cramps -discussed various causes including electrolyte derangement -discussed obtaining BMP -meds reviewed.  Lipitor could be causing symptoms.   -discussed f/u after resolution of cellulitis. -pt advised to stretch, drink plenty of fluids, consider trying a teaspoon of yellow mustard  Bursitis -pt to f/u with PM&R -ok to use  heat off and on  -Advised to try OTC creams, but pt unable to reach affected area(s)  F/u prn   I discussed the assessment and treatment plan with the patient. The patient was provided an opportunity to ask questions and all were answered. The patient agreed with the plan and demonstrated an understanding of the instructions.   The patient was advised to call back or seek an in-person evaluation if the symptoms worsen or if the condition fails to improve as anticipated.   Billie Ruddy, MD

## 2019-07-04 NOTE — Telephone Encounter (Signed)
Scheduled for 7/24 for OV with bursa injection.

## 2019-07-12 ENCOUNTER — Other Ambulatory Visit: Payer: Self-pay | Admitting: Family Medicine

## 2019-07-18 ENCOUNTER — Encounter: Payer: Self-pay | Admitting: Physical Medicine and Rehabilitation

## 2019-07-18 ENCOUNTER — Encounter

## 2019-07-18 ENCOUNTER — Ambulatory Visit: Payer: Self-pay

## 2019-07-18 ENCOUNTER — Ambulatory Visit (INDEPENDENT_AMBULATORY_CARE_PROVIDER_SITE_OTHER): Payer: Medicare Other | Admitting: *Deleted

## 2019-07-18 ENCOUNTER — Ambulatory Visit (INDEPENDENT_AMBULATORY_CARE_PROVIDER_SITE_OTHER): Payer: Medicare Other | Admitting: Physical Medicine and Rehabilitation

## 2019-07-18 VITALS — BP 198/82 | HR 62 | Ht 61.5 in | Wt 258.0 lb

## 2019-07-18 DIAGNOSIS — I480 Paroxysmal atrial fibrillation: Secondary | ICD-10-CM | POA: Diagnosis not present

## 2019-07-18 DIAGNOSIS — R531 Weakness: Secondary | ICD-10-CM

## 2019-07-18 DIAGNOSIS — E1142 Type 2 diabetes mellitus with diabetic polyneuropathy: Secondary | ICD-10-CM

## 2019-07-18 DIAGNOSIS — M48062 Spinal stenosis, lumbar region with neurogenic claudication: Secondary | ICD-10-CM

## 2019-07-18 DIAGNOSIS — M5416 Radiculopathy, lumbar region: Secondary | ICD-10-CM | POA: Diagnosis not present

## 2019-07-18 DIAGNOSIS — M961 Postlaminectomy syndrome, not elsewhere classified: Secondary | ICD-10-CM

## 2019-07-18 DIAGNOSIS — R55 Syncope and collapse: Secondary | ICD-10-CM

## 2019-07-18 DIAGNOSIS — Z794 Long term (current) use of insulin: Secondary | ICD-10-CM

## 2019-07-18 MED ORDER — BETAMETHASONE SOD PHOS & ACET 6 (3-3) MG/ML IJ SUSP
12.0000 mg | Freq: Once | INTRAMUSCULAR | Status: AC
  Administered 2019-07-18: 12 mg

## 2019-07-18 NOTE — Progress Notes (Signed)
 .  Numeric Pain Rating Scale and Functional Assessment Average Pain 10 Pain Right Now 3 My pain is constant, sharp and aching Pain is worse with: walking and some activites Pain improves with: rest   In the last MONTH (on 0-10 scale) has pain interfered with the following?  1. General activity like being  able to carry out your everyday physical activities such as walking, climbing stairs, carrying groceries, or moving a chair?  Rating(8)  2. Relation with others like being able to carry out your usual social activities and roles such as  activities at home, at work and in your community. Rating(8)  3. Enjoyment of life such that you have  been bothered by emotional problems such as feeling anxious, depressed or irritable?  Rating(5)

## 2019-07-19 LAB — CUP PACEART REMOTE DEVICE CHECK
Date Time Interrogation Session: 20200724183934
Implantable Pulse Generator Implant Date: 20170221

## 2019-07-21 DIAGNOSIS — M961 Postlaminectomy syndrome, not elsewhere classified: Secondary | ICD-10-CM | POA: Insufficient documentation

## 2019-07-21 DIAGNOSIS — M48062 Spinal stenosis, lumbar region with neurogenic claudication: Secondary | ICD-10-CM | POA: Insufficient documentation

## 2019-07-21 NOTE — Procedures (Signed)
Lumbosacral Transforaminal Epidural Steroid Injection - Sub-Pedicular Approach with Fluoroscopic Guidance  [name]      Date of Birth: 1941/08/21 MRN: [id number] PCP: Billie Ruddy, MD      Visit Date: 07/18/2019   Universal Protocol:    Date/Time: 07/18/2019  Consent Given By: the patient  Position: PRONE  Additional Comments: Vital signs were monitored before and after the procedure. Patient was prepped and draped in the usual sterile fashion. The correct patient, procedure, and site was verified.   Injection Procedure Details:  Procedure Site One Meds Administered:  Meds ordered this encounter  Medications  . betamethasone acetate-betamethasone sodium phosphate (CELESTONE) injection 12 mg    Laterality: Right  Location/Site:  L3-L4  Needle size: 22 G  Needle type: Spinal  Needle Placement: Transforaminal  Findings:    -Comments: Excellent flow of contrast along the nerve and into the epidural space.  Procedure Details: After squaring off the end-plates to get a true AP view, the C-arm was positioned so that an oblique view of the foramen as noted above was visualized. The target area is just inferior to the "nose of the scotty dog" or sub pedicular. The soft tissues overlying this structure were infiltrated with 2-3 ml. of 1% Lidocaine without Epinephrine.  The spinal needle was inserted toward the target using a "trajectory" view along the fluoroscope beam.  Under AP and lateral visualization, the needle was advanced so it did not puncture dura and was located close the 6 O'Clock position of the pedical in AP tracterory. Biplanar projections were used to confirm position. Aspiration was confirmed to be negative for CSF and/or blood. A 1-2 ml. volume of Isovue-250 was injected and flow of contrast was noted at each level. Radiographs were obtained for documentation purposes.   After attaining the desired flow of contrast documented above, a 0.5 to  1.0 ml test dose of 0.25% Marcaine was injected into each respective transforaminal space.  The patient was observed for 90 seconds post injection.  After no sensory deficits were reported, and normal lower extremity motor function was noted,   the above injectate was administered so that equal amounts of the injectate were placed at each foramen (level) into the transforaminal epidural space.   Additional Comments:  The patient tolerated the procedure well Dressing: 2 x 2 sterile gauze and Band-Aid    Post-procedure details: Patient was observed during the procedure. Post-procedure instructions were reviewed.  Patient left the clinic in stable condition.

## 2019-07-21 NOTE — Progress Notes (Signed)
Katie Mcintyre - 78 y.o. female MRN [id number]  Date of birth: 06/01/1941  Office Visit Note: Visit Date: 07/18/2019 PCP: Billie Ruddy, MD Referred by: Billie Ruddy, MD  Subjective: Chief Complaint  Patient presents with  . Lower Back - Pain  . Right Leg - Pain, Weakness   HPI: Katie Mcintyre is a 78 y.o. female who comes in today For planned initial bursa injection fluoroscopic guidance to the body habitus.  Unfortunately when the patient came in today her symptoms were completely different.  Despite the fact that on the phone there was a higher preinjection questioning she had said this was the same pain that she had before in the right buttock area worse with sitting but now it is really pain in the low back down the right leg all the way to the foot at times.  She reports weakness of the right leg sometimes she feels like she cannot lift her leg up into the shower.  She denies any groin pain.  She does have paresthesia but she also suffers from polyneuropathy.  Her case is complicated by insulin-dependent diabetes with complications including peripheral polyneuropathy and heart disease.  She is not on any anticoagulation.  She has had no trauma or other issues since we have seen her except for this worsening right hip and leg pain with weakness.  She does not endorse any foot drop just feels like the leg is weak proximally.  She has had no bowel or bladder changes no fevers chills or night sweats.  No unexplained weight loss.  She has a history of multiple lumbar laminectomy.  She really is followed by Dr. Basil Dess in our office from a spine surgery standpoint.  The last time she saw him was in March.  The last epidural injection we performed was in September.  She does have MRI from 2017 at [facility].  This shows severe multifactorial stenosis at L3-4.  Medications are not helpful.  Review of Systems  Constitutional: Negative for chills, fever, malaise/fatigue and weight loss.  HENT:  Negative for hearing loss and sinus pain.   Eyes: Negative for blurred vision, double vision and photophobia.  Respiratory: Negative for cough and shortness of breath.   Cardiovascular: Negative for chest pain, palpitations and leg swelling.  Gastrointestinal: Negative for abdominal pain, nausea and vomiting.  Genitourinary: Negative for flank pain.  Musculoskeletal: Positive for back pain and joint pain. Negative for myalgias.  Skin: Negative for itching and rash.  Neurological: Positive for tingling and weakness. Negative for tremors and focal weakness.  Endo/Heme/Allergies: Negative.   Psychiatric/Behavioral: Negative for depression.  All other systems reviewed and are negative.  Otherwise per HPI.  Assessment & Plan: Visit Diagnoses:  1. Lumbar radiculopathy   2. Spinal stenosis of lumbar region with neurogenic claudication   3. Weakness   4. Post laminectomy syndrome   5. Type 2 diabetes mellitus with diabetic polyneuropathy, with long-term current use of insulin (HCC)     Plan: Findings:  Complicated chronic pain patient with history of insulin-dependent diabetes with complications as well as prior lumbar laminectomy and MRI from 2017 showing severe stenosis at L3-4.  More recently she has had more symptoms of a bursitis of the initial bursitis and really got some relief with the injection.  Unfortunately today she is having more radicular complaints with perceived weakness of the right thigh.  Exam does not show any specific focal weakness on testing although the right side she does give  less effort.  Instantaneous strength is there.  There is no foot drop.  We are going to today to complete a right L3 transforaminal injection diagnostically and hopefully therapeutically.  Should continue current activity level.  If she does not get much relief she will follow-up with Dr. Louanne Skye or new MRI should probably be ordered.  She was instructed if there is any changes of bowel or bladder  function or anything like that she should give [place] a call.  She was definitely concerned about when she should see the emergency room physicians for any issues and I told her that really a case where if she feels like she needs to go she should do that.    Meds & Orders:  Meds ordered this encounter  Medications  . betamethasone acetate-betamethasone sodium phosphate (CELESTONE) injection 12 mg    Orders Placed This Encounter  Procedures  . XR C-ARM NO REPORT  . Epidural Steroid injection    Follow-up: Return if symptoms worsen or fail to improve, for Basil Dess, M.D..   Procedures: No procedures performed  Lumbosacral Transforaminal Epidural Steroid Injection - Sub-Pedicular Approach with Fluoroscopic Guidance  [name]      Date of Birth: 10-13-1941 MRN: [id number] PCP: Billie Ruddy, MD      Visit Date: 07/18/2019   Universal Protocol:    Date/Time: 07/18/2019  Consent Given By: the patient  Position: PRONE  Additional Comments: Vital signs were monitored before and after the procedure. Patient was prepped and draped in the usual sterile fashion. The correct patient, procedure, and site was verified.   Injection Procedure Details:  Procedure Site One Meds Administered:  Meds ordered this encounter  Medications  . betamethasone acetate-betamethasone sodium phosphate (CELESTONE) injection 12 mg    Laterality: Right  Location/Site:  L3-L4  Needle size: 22 G  Needle type: Spinal  Needle Placement: Transforaminal  Findings:    -Comments: Excellent flow of contrast along the nerve and into the epidural space.  Procedure Details: After squaring off the end-plates to get a true AP view, the C-arm was positioned so that an oblique view of the foramen as noted above was visualized. The target area is just inferior to the "nose of the scotty dog" or sub pedicular. The soft tissues overlying this structure were infiltrated with 2-3 ml. of 1%  Lidocaine without Epinephrine.  The spinal needle was inserted toward the target using a "trajectory" view along the fluoroscope beam.  Under AP and lateral visualization, the needle was advanced so it did not puncture dura and was located close the 6 O'Clock position of the pedical in AP tracterory. Biplanar projections were used to confirm position. Aspiration was confirmed to be negative for CSF and/or blood. A 1-2 ml. volume of Isovue-250 was injected and flow of contrast was noted at each level. Radiographs were obtained for documentation purposes.   After attaining the desired flow of contrast documented above, a 0.5 to 1.0 ml test dose of 0.25% Marcaine was injected into each respective transforaminal space.  The patient was observed for 90 seconds post injection.  After no sensory deficits were reported, and normal lower extremity motor function was noted,   the above injectate was administered so that equal amounts of the injectate were placed at each foramen (level) into the transforaminal epidural space.   Additional Comments:  The patient tolerated the procedure well Dressing: 2 x 2 sterile gauze and Band-Aid    Post-procedure details: Patient was observed during the  procedure. Post-procedure instructions were reviewed.  Patient left the clinic in stable condition.     Clinical History: FINDINGS:  There is lumbosacral transitional anatomy. For the purpose of this dictation, the last well-formed disc space is designated as S1-S2 and labeled on image as such.  Minimal anterolisthesis of L4 on L5. Normal lumbar spinal alignment otherwise. Status post L3-L5 laminectomy. Marrow is unremarkable. Conus terminates at approximately L1. The visualized spinal cord morphology and signal, and the cauda equina appear normal. Negative for epidural hematoma. Small left renal cysts. Retroperitoneal soft tissues are unremarkable. Sacroiliac joints are normal.  --T11-T12: Imaged on the  sagittal plane only. Large posterior disc osteophyte complex impression on ventral cord. No cord signal abnormality. Moderate bilateral neuroforaminal narrowing. --T12-L1: Mild posterior disc bulge without canal or foraminal narrowing. Mild bilateral facet degenerative disease.  --L1-L2: There is no evidence of spinal stenosis, disc bulge or neural foraminal narrowing. Mild left facet degenerative disease. --L2-L3: Large posterior disc osteophyte complex and severe bilateral facet hypertrophy, resulting in moderate spinal canal narrowing and severe bilateral neuroforamina narrowing.  --L3-L4: Broad-based disc bulge and severe bilateral facet hypertrophy, resulting in the severe spinal canal and bilateral neural foraminal narrowing. --L4-L5: Status post laminectomy. Broad based disc bulge and severe bilateral facet hypertrophy resulting in mild spinal canal narrowing and severe left and mild right neuroforaminal narrowing.  --L5-S1: Status post laminectomy. No disc bulge or spinal canal narrowing. Severe bilateral neuroforamina narrowing. Severe bilateral facet hypertrophy   IMPRESSION: 1. Spinal canal narrowing is moderate at L2-L3, severe at L3-L4, and mild at L4-L5, mostly from degenerative facet disease. 2. Multilevel neuroforaminal narrowing, which is severe at bilateral L2-L3, bilateral L3-L4,, left L4-L5, and bilateral L5-S1.   Electronically Reviewed BS:WHQPR Roosevelt Locks, MD Electronically Reviewed on:09/09/2016 7:55 PM   She reports that she quit smoking about 54 years ago. She has never used smokeless tobacco.  Recent Labs    11/12/18 1205 03/06/19 1441 07/02/19 1103  HGBA1C 8.6* 8.5* 7.1*    Objective:  VS:  HT:5' 1.5" (156.2 cm)   WT:258 lb (117 kg)  BMI:47.97    BP:(!) 198/82  HR:62bpm  TEMP: ( )  RESP:  Physical Exam Vitals signs and nursing note reviewed.  Constitutional:      General: She is not in acute distress.    Appearance: Normal appearance. She is  well-developed. She is obese.  HENT:     Head: Normocephalic and atraumatic.     Nose: Nose normal.     Mouth/Throat:     Mouth: Mucous membranes are moist.     Pharynx: Oropharynx is clear.  Eyes:     Conjunctiva/sclera: Conjunctivae normal.     Pupils: Pupils are equal, round, and reactive to light.  Neck:     Musculoskeletal: Normal range of motion and neck supple.  Cardiovascular:     Rate and Rhythm: Regular rhythm.  Pulmonary:     Effort: Pulmonary effort is normal. No respiratory distress.  Abdominal:     General: There is no distension.     Palpations: Abdomen is soft.     Tenderness: There is no guarding.  Musculoskeletal:     Right lower leg: Edema present.     Left lower leg: Edema present.     Comments: Patient ambulates with walker with a forward flexed lumbar spine.  She has pain stiffness on extension and facet loading.  No pain with hip rotation she is just somewhat tender in general across the low back and trochanters.  She has good distal strength.  Proximal strength is interesting she will lift the left leg freely the right leg she has very gingerly trying to hip flex but once I get her to do it she seems like she holds tension pretty well with resistance.  Skin:    General: Skin is warm and dry.     Findings: Erythema present. No lesion or rash.  Neurological:     General: No focal deficit present.     Mental Status: She is alert and oriented to person, place, and time.     Motor: Weakness present. No abnormal muscle tone.     Coordination: Coordination normal.     Gait: Gait abnormal.  Psychiatric:        Mood and Affect: Mood normal.        Behavior: Behavior normal.        Thought Content: Thought content normal.     Ortho Exam Imaging: No results found.  Past Medical/Family/Surgical/Social History: Medications & Allergies reviewed per EMR, new medications updated. Patient Active Problem List   Diagnosis Date Noted  . Spinal stenosis of lumbar  region with neurogenic claudication 07/21/2019  . Post laminectomy syndrome 07/21/2019  . Type 2 diabetes mellitus with retinopathy of both eyes, with long-term current use of insulin ([place]) 07/02/2019  . Type 2 diabetes mellitus with diabetic polyneuropathy, with long-term current use of insulin ([place]) 07/02/2019  . Type 2 diabetes mellitus with hyperglycemia, with long-term current use of insulin ([place]) 07/02/2019  . Cellulitis of right leg 07/02/2019  . PAF (paroxysmal atrial fibrillation) ([place]) 11/12/2018  . Hyperlipidemia associated with type 2 diabetes mellitus ([place]) 11/12/2018  . Hypertension associated with diabetes ([place]) 02/28/2018  . Mild episode of recurrent major depressive disorder ([place]) 08/21/2017  . Morbid obesity ([place]) 01/15/2017  . Status post placement of implantable loop recorder 08/17/2016  . Syncope 02/02/2016  . Bradycardia 02/02/2016  . SOB (shortness of breath) 02/02/2016  . Pre-syncope 02/02/2016  . History of colonic polyps 01/11/2016  . Type 2 diabetes mellitus with circulatory disorder, with long-term current use of insulin ([place]) 01/11/2016  . Chronic back pain - managed by Dr. Marcos Eke at [facility] 03/12/2014  . Gout 06/30/2013  . Hypothyroidism 06/30/2013  . Iron deficiency anemia 06/30/2013  . Venous stasis of lower extremity 01/30/2013  . Refusal of blood transfusion for reasons of conscience 01/30/2013  . Lumbar disc disease with radiculopathy 01/30/2013   Past Medical History:  Diagnosis Date  . Anemia   . Arthritis   . Cancer ([place]) 1969   , Status post complete hysterectomy per patient  . Cataract    removed both eyes with lens implant both   . Chicken pox   . Chronic bronchitis ([place])   . Colon polyp   . Depression 06/30/2013  . Diabetes mellitus without complication ([place])   . Diverticulitis   . Frequent headaches   . GERD (gastroesophageal reflux disease)   . Gout 06/30/2013  . Hx of dizziness   . Hyperlipidemia   . Hypertension   .  Post-operative nausea and vomiting   . S/P epidural steroid injection 09/27/2016   done in back at [facility]   . Spinal stenosis of lumbar region    s/p spine surgery 02/12/13 with Dr Marcos Eke at [facility]  . Thyroid disease   . Urine incontinence   . Use of cane as ambulatory aid    Family History  Problem Relation Age of Onset  . Alcohol abuse Father   .  Cirrhosis Father   . Heart attack Brother   . Breast cancer Sister        Reports all family members with breast cancer have had genetic testing that was negative  . Cancer Mother        renal cancer  . Stroke Paternal Grandmother   . Alcohol abuse Brother   . Breast cancer Sister        renal cancer  . Diabetes Brother   . Heart Problems Daughter   . Healthy Daughter   . Healthy Daughter   . Healthy Son   . Healthy Son   . Arthritis Other        parents  . Hyperlipidemia Other        parent  . Hypertension Other        parent/grandparent  . Diabetes Other        parent/grandparent  . Heart disease Other   . Mental illness Other   . Colon cancer Neg Hx   . Esophageal cancer Neg Hx   . Prostate cancer Neg Hx   . Rectal cancer Neg Hx   . Seizures Neg Hx   . Colon polyps Neg Hx    Past Surgical History:  Procedure Laterality Date  . ABDOMINAL HYSTERECTOMY  1970   complete hysterectomy for cervical cancer  . BACK SURGERY  01/2013  . BACK SURGERY  1995  . CATARACT EXTRACTION, BILATERAL     with lens implant   . CERVICAL CONE BIOPSY  1970  . COLONOSCOPY    . EP IMPLANTABLE DEVICE N/A 02/15/2016   Procedure: Loop Recorder Insertion;  Surgeon: Sanda Klein, MD;  Location: [facility];  Service: Cardiovascular;  Laterality: N/A;  . POLYPECTOMY    . WRIST SURGERY  2010   for wrist fracture   Social History   Occupational History  . Occupation: Radiation protection practitioner    Comment: retired  Tobacco Use  . Smoking status: Former Smoker    Quit date: 12/25/1964    Years since quitting: 54.6  . Smokeless tobacco: Never  Used  . Tobacco comment: smoked 6 years / when she was in college   Substance and Sexual Activity  . Alcohol use: Yes    Comment: occ with steak dinner   . Drug use: No  . Sexual activity: Not on file

## 2019-07-23 ENCOUNTER — Ambulatory Visit: Payer: Medicare Other | Admitting: Specialist

## 2019-07-28 NOTE — Progress Notes (Signed)
Carelink Summary Report / Loop Recorder 

## 2019-08-11 ENCOUNTER — Ambulatory Visit: Payer: Medicare Other | Admitting: Podiatry

## 2019-08-12 ENCOUNTER — Telehealth: Payer: Self-pay

## 2019-08-12 ENCOUNTER — Other Ambulatory Visit: Payer: Self-pay | Admitting: Family Medicine

## 2019-08-12 NOTE — Telephone Encounter (Signed)
Spoke to pt, informed her of RRT alert 08/11/19. Will route to Dr. Sallyanne Kuster & nurse for [place].

## 2019-08-13 NOTE — Telephone Encounter (Signed)
The patient stated that she has been doing better. Appointment made for 10/26 with Dr. Sallyanne Kuster.

## 2019-08-13 NOTE — Telephone Encounter (Signed)
During three years of device monitoring, no clinically important arrhythmia has been detected. I assume that she has not had syncope, but she has not had Cardiology follow up since the initial appointment with Dr. Debara Pickett and the ILR implantation in 2017.  I would schedule her for an office visit with me (not urgent and virtual visit is OK) to discuss whether explanting the device is desired. I do not think she needs reimplantation.

## 2019-08-14 ENCOUNTER — Other Ambulatory Visit: Payer: Self-pay | Admitting: Family Medicine

## 2019-08-15 ENCOUNTER — Other Ambulatory Visit: Payer: Self-pay | Admitting: Family Medicine

## 2019-08-15 MED ORDER — ISOSORBIDE DINITRATE 20 MG PO TABS: ORAL_TABLET | ORAL | 0 refills | Status: DC

## 2019-08-15 MED ORDER — METFORMIN HCL 1000 MG PO TABS: 1000.0000 mg | ORAL_TABLET | Freq: Two times a day (BID) | ORAL | 0 refills | Status: DC

## 2019-08-15 NOTE — Telephone Encounter (Signed)
Please see rx refill request  °

## 2019-08-15 NOTE — Telephone Encounter (Signed)
Copied from [place] [phone]. Topic: Quick Communication - Rx Refill/Question >> Aug 15, 2019 12:43 PM Leward Quan A wrote: Medication: metFORMIN (GLUCOPHAGE) 1000 MG tablet, isosorbide dinitrate (ISORDIL) 20 MG tablet   Has the patient contacted their pharmacy?  yes (Agent: If no, request that the patient contact the pharmacy for the refill.) (Agent: If yes, when and what did the pharmacy advise?)  Preferred Pharmacy (with phone number or street name): [id number] [address], [place] [phone] (Phone) [phone] (Fax    Agent: Please be advised that RX refills may take up to 3 business days. We ask that you follow-up with your pharmacy.

## 2019-08-19 ENCOUNTER — Other Ambulatory Visit: Payer: Self-pay | Admitting: Family Medicine

## 2019-08-20 ENCOUNTER — Ambulatory Visit (INDEPENDENT_AMBULATORY_CARE_PROVIDER_SITE_OTHER): Payer: Medicare Other | Admitting: *Deleted

## 2019-08-20 DIAGNOSIS — R55 Syncope and collapse: Secondary | ICD-10-CM | POA: Diagnosis not present

## 2019-08-20 DIAGNOSIS — I480 Paroxysmal atrial fibrillation: Secondary | ICD-10-CM

## 2019-08-20 LAB — CUP PACEART REMOTE DEVICE CHECK
Date Time Interrogation Session: 20200826180642
Implantable Pulse Generator Implant Date: 20170221

## 2019-08-22 ENCOUNTER — Encounter: Payer: Self-pay | Admitting: Family Medicine

## 2019-08-26 ENCOUNTER — Ambulatory Visit: Payer: Medicare Other | Admitting: Internal Medicine

## 2019-08-29 NOTE — Progress Notes (Signed)
Carelink Summary Report / Loop Recorder 

## 2019-09-15 ENCOUNTER — Other Ambulatory Visit: Payer: Self-pay | Admitting: Family Medicine

## 2019-09-17 ENCOUNTER — Telehealth: Payer: Self-pay

## 2019-09-17 NOTE — Telephone Encounter (Signed)
Left message for patient to regarding disconnected monitor 

## 2019-09-19 ENCOUNTER — Other Ambulatory Visit: Payer: Self-pay

## 2019-09-19 ENCOUNTER — Telehealth (INDEPENDENT_AMBULATORY_CARE_PROVIDER_SITE_OTHER): Payer: Medicare Other | Admitting: Family Medicine

## 2019-09-19 DIAGNOSIS — Z794 Long term (current) use of insulin: Secondary | ICD-10-CM | POA: Diagnosis not present

## 2019-09-19 DIAGNOSIS — M48062 Spinal stenosis, lumbar region with neurogenic claudication: Secondary | ICD-10-CM

## 2019-09-19 DIAGNOSIS — E1159 Type 2 diabetes mellitus with other circulatory complications: Secondary | ICD-10-CM

## 2019-09-19 DIAGNOSIS — L03115 Cellulitis of right lower limb: Secondary | ICD-10-CM | POA: Diagnosis not present

## 2019-09-19 MED ORDER — DOXYCYCLINE HYCLATE 100 MG PO TABS: 100.0000 mg | ORAL_TABLET | Freq: Two times a day (BID) | ORAL | 0 refills | Status: AC

## 2019-09-19 NOTE — Progress Notes (Signed)
Virtual Visit via Video Note  I connected with Katie Mcintyre on 09/19/19 at  3:00 PM EDT by a video enabled telemedicine application 2/2 [id number] pandemic and verified that I am speaking with the correct person using two identifiers.  Location patient: home Location provider:work or home office Persons participating in the virtual visit: patient, provider  I discussed the limitations of evaluation and management by telemedicine and the availability of in person appointments. The patient expressed understanding and agreed to proceed.   HPI: Pt is a 78 year old female with pmh sig for venous stasis, type 2 diabetes, HTN, paroxysmal A. fib, spinal stenosis, GERD, obesity, history of depression, HLD, gout, history of cataract seen for acute concern.  Pt with a sore on her R ankle.  States the area has been red, tender, and warm to touch.  Pt denies fever, chills, drainage.  Pt was on Augmentin in July for cellulitis of same area.  Unable to wear TED hose or compression socks as has difficulty putting them on.  Patient lives alone.  Patient states her glucometer gave her an error message indicating it needs a new battery.  Patient has yet to get a replacement battery.  Patient also inquires about obtaining a new rolling walker with a basket and a seat.  Patient states her husband is old and squeaks when she uses it.  ROS: See pertinent positives and negatives per HPI.  Past Medical History:  Diagnosis Date  . Anemia   . Arthritis   . Cancer ([place]) 1969   , Status post complete hysterectomy per patient  . Cataract    removed both eyes with lens implant both   . Chicken pox   . Chronic bronchitis ([place])   . Colon polyp   . Depression 06/30/2013  . Diabetes mellitus without complication ([place])   . Diverticulitis   . Frequent headaches   . GERD (gastroesophageal reflux disease)   . Gout 06/30/2013  . Hx of dizziness   . Hyperlipidemia   . Hypertension   . Post-operative nausea and vomiting    . S/P epidural steroid injection 09/27/2016   done in back at [place]   . Spinal stenosis of lumbar region    s/p spine surgery 02/12/13 with Dr Marcos [facility]ke at [facility]  . Thyroid disease   . Urine incontinence   . Use of cane as ambulatory aid     Past Surgical History:  Procedure Laterality Date  . ABDOMINAL HYSTERECTOMY  1970   complete hysterectomy for cervical cancer  . BACK SURGERY  01/2013  . BACK SURGERY  1995  . CATARACT EXTRACTION, BILATERAL     with lens implant   . CERVICAL CONE BIOPSY  1970  . COLONOSCOPY    . EP IMPLANTABLE DEVICE N/A 02/15/2016   Procedure: Loop Recorder Insertion;  Surgeon: Sanda Klein, MD;  Location: [facility];  Service: Cardiovascular;  Laterality: N/A;  . POLYPECTOMY    . WRIST SURGERY  2010   for wrist fracture    Family History  Problem Relation Age of Onset  . Alcohol abuse Father   . Cirrhosis Father   . Heart attack Brother   . Breast cancer Sister        Reports all family members with breast cancer have had genetic testing that was negative  . Cancer Mother        renal cancer  . Stroke Paternal Grandmother   . Alcohol abuse Brother   . Breast cancer Sister  renal cancer  . Diabetes Brother   . Heart Problems Daughter   . Healthy Daughter   . Healthy Daughter   . Healthy Son   . Healthy Son   . Arthritis Other        parents  . Hyperlipidemia Other        parent  . Hypertension Other        parent/grandparent  . Diabetes Other        parent/grandparent  . Heart disease Other   . Mental illness Other   . Colon cancer Neg Hx   . Esophageal cancer Neg Hx   . Prostate cancer Neg Hx   . Rectal cancer Neg Hx   . Seizures Neg Hx   . Colon polyps Neg Hx     Current Outpatient Medications:  .  ACCU-CHEK AVIVA PLUS test strip, USE AS INSTRUCTED TO CHECK BLOOD SUGAR TWICE A DAY * E11.9*, Disp: 100 each, Rfl: 5 .  ACCU-CHEK FASTCLIX LANCETS MISC, USE AS DIRECTED, Disp: 102 each, Rfl: 5 .  acetaminophen (TYLENOL)  500 MG tablet, Take 1,000 mg by mouth every 8 (eight) hours as needed., Disp: , Rfl:  .  amLODipine (NORVASC) 5 MG tablet, Take 1 tablet by mouth once daily, Disp: 85 tablet, Rfl: 0 .  amoxicillin-clavulanate (AUGMENTIN) 875-125 MG tablet, Take 1 tablet by mouth 2 (two) times daily., Disp: 20 tablet, Rfl: 0 .  atorvastatin (LIPITOR) 40 MG tablet, Take 40 mg by mouth daily., Disp: , Rfl:  .  B-D UF III MINI PEN NEEDLES 31G X 5 MM MISC, USE TO INJECT INSULIN TWICE A DAY, Disp: 100 each, Rfl: 3 .  Blood Glucose Calibration (ACCU-CHEK AVIVA) SOLN, Use as directed., Disp: 1 each, Rfl: 1 .  calcium-vitamin D (OSCAL 500/200 D-3) 500-200 MG-UNIT per tablet, Take 1 tablet by mouth daily., Disp: , Rfl:  .  carvedilol (COREG) 6.25 MG tablet, Take 1 tablet (6.25 mg total) by mouth 2 (two) times daily., Disp: 180 tablet, Rfl: 3 .  cloNIDine (CATAPRES) 0.1 MG tablet, TAKE 1 TABLET BY MOUTH TWICE A DAY, Disp: 180 tablet, Rfl: 2 .  ferrous sulfate 325 (65 FE) MG tablet, Take 325 mg by mouth daily with breakfast., Disp: , Rfl:  .  gabapentin (NEURONTIN) 100 MG capsule, Take 1 capsule (100 mg total) by mouth at bedtime., Disp: 90 capsule, Rfl: 3 .  hydrALAZINE (APRESOLINE) 50 MG tablet, Take 1 tablet (50 mg total) by mouth 3 (three) times daily., Disp: 270 tablet, Rfl: 1 .  Insulin Lispro Prot & Lispro (HUMALOG 75/25 MIX) (75-25) 100 UNIT/ML Kwikpen, Inject 12 Units into the skin 2 (two) times daily before a meal., Disp: 9 mL, Rfl: 11 .  irbesartan (AVAPRO) 75 MG tablet, Take 2 tablets (150 mg total) by mouth daily., Disp: 180 tablet, Rfl: 1 .  isosorbide dinitrate (ISORDIL) 20 MG tablet, Take one tablet by mouth two times a day., Disp: 180 tablet, Rfl: 0 .  Lancet Devices (ACCU-CHEK SOFTCLIX) lancets, Use as instructed twice a day, Disp: 1 each, Rfl: 5 .  levothyroxine (SYNTHROID) 125 MCG tablet, TAKE 1 TABLET BY MOUTH EVERY DAY, Disp: 90 tablet, Rfl: 1 .  metFORMIN (GLUCOPHAGE) 1000 MG tablet, TAKE 1 TABLET BY  MOUTH TWICE DAILY WITH MEALS, Disp: 180 tablet, Rfl: 0 .  Sennosides-Docusate Sodium (STOOL SOFTENER & LAXATIVE PO), Take 1 tablet by mouth daily as needed (constipation). , Disp: , Rfl:  .  vitamin B-12 (CYANOCOBALAMIN) 1000 MCG tablet, Take by mouth.,  Disp: , Rfl:   Current Facility-Administered Medications:  .  0.9 %  sodium chloride infusion, 500 mL, Intravenous, Once, Pyrtle, Lajuan Lines, MD .  ipratropium-albuterol (DUONEB) 0.5-2.5 (3) MG/3ML nebulizer solution 3 mL, 3 mL, Nebulization, Once, Kordsmeier, Gregary Signs, FNP  EXAM:  VITALS per patient if applicable:  GENERAL: alert, oriented, appears well and in no acute distress  HEENT: atraumatic, conjunctiva clear, no obvious abnormalities on inspection of external nose and ears  NECK: normal movements of the head and neck  LUNGS: on inspection no signs of respiratory distress, breathing rate appears normal, no obvious gross SOB, gasping or wheezing  CV: no obvious cyanosis  SKIN: Right lower extremity with venous stasis changes and 1 and half centimeter to 2 cm abrasion without drainage surrounded by mild to moderate erythema  MS: moves all visible extremities without noticeable abnormality  PSYCH/NEURO: pleasant and cooperative, no obvious depression or anxiety, speech and thought processing grossly intact  ASSESSMENT AND PLAN:  Discussed the following assessment and plan:  Cellulitis of right leg -Discussed keeping area clean and dry -We will start antibiotic -Patient given precautions and advised to elevate lower extremities when sitting - Plan: doxycycline (VIBRA-TABS) 100 MG tablet  Type 2 diabetes mellitus with other circulatory complication, with long-term current use of insulin (HCC) -Last hemoglobin A1c 7.1% 07/02/2019 -Continue current medications -Lifestyle modifications encouraged -Patient advised to take her glucometer and/or the battery number in the booklet to the pharmacy to obtain a new battery  Spinal stenosis  of lumbar region with neurogenic claudication  - Plan: For home use only DME 4 wheeled rolling walker with seat [id number])  Follow-up PRN in 1 week  I discussed the assessment and treatment plan with the patient. The patient was provided an opportunity to ask questions and all were answered. The patient agreed with the plan and demonstrated an understanding of the instructions.   The patient was advised to call back or seek an in-person evaluation if the symptoms worsen or if the condition fails to improve as anticipated.   Billie Ruddy, MD

## 2019-09-22 ENCOUNTER — Ambulatory Visit (INDEPENDENT_AMBULATORY_CARE_PROVIDER_SITE_OTHER): Payer: Medicare Other | Admitting: *Deleted

## 2019-09-22 DIAGNOSIS — R55 Syncope and collapse: Secondary | ICD-10-CM

## 2019-09-22 DIAGNOSIS — I480 Paroxysmal atrial fibrillation: Secondary | ICD-10-CM | POA: Diagnosis not present

## 2019-09-23 LAB — CUP PACEART REMOTE DEVICE CHECK
Date Time Interrogation Session: 20200929172937
Implantable Pulse Generator Implant Date: 20170221

## 2019-09-24 ENCOUNTER — Telehealth: Payer: Self-pay | Admitting: Family Medicine

## 2019-09-24 ENCOUNTER — Other Ambulatory Visit: Payer: Self-pay

## 2019-09-24 DIAGNOSIS — M5116 Intervertebral disc disorders with radiculopathy, lumbar region: Secondary | ICD-10-CM

## 2019-09-24 NOTE — Telephone Encounter (Signed)
DME order was sent to [facility]

## 2019-09-24 NOTE — Telephone Encounter (Signed)
Pt had telemedicine appt with dr banks on 09-19-2019 and she is calling to ask for rollator walker with basket and seat. Pt would like the one she can stand upright. Pt has uhc

## 2019-09-29 ENCOUNTER — Other Ambulatory Visit: Payer: Self-pay

## 2019-10-01 ENCOUNTER — Telehealth: Payer: Self-pay | Admitting: Physical Medicine and Rehabilitation

## 2019-10-01 ENCOUNTER — Ambulatory Visit: Payer: Medicare Other | Admitting: Internal Medicine

## 2019-10-01 NOTE — Progress Notes (Signed)
Carelink Summary Report / Loop Recorder 

## 2019-10-01 NOTE — Progress Notes (Deleted)
Name: Katie Mcintyre  Age/ Sex: 78 y.o., female   MRN/ DOB: [id number], 1941/03/30     PCP: Billie Ruddy, MD   Reason for Endocrinology Evaluation: Type 2 Diabetes Mellitus  Initial Endocrine Consultative Visit: 07/02/2019    Mcintyre IDENTIFIER: Katie Mcintyre is a 78 y.o. female with a past medical history of T2DM, Dyslipidemia, PAF and chronic pain syndrome. Katie Mcintyre has followed with Endocrinology clinic since 07/02/2019 for consultative assistance with management of her diabetes.  DIABETIC HISTORY:  Katie Mcintyre was diagnosed with T2DM many years ago. Has been on Metformin since her diagnosis as well as insulin. Her hemoglobin A1c has ranged from 6.8% in 2017, peaking at 8.5% in 2020.  On her initial visit to our clinic she was on Metformin and Humalog mix that she would take after meals, with an A1c 7.1%. We continued this regimen except reduced Katie dose and advised her to take it before meals.  SUBJECTIVE:   During Katie last visit (07/02/2019): A1c 7.1%. Reduced Novolog Mix dose and continued Metformin.   Today (10/01/2019): Katie Mcintyre is here for a 3 month follow up on diabetes management.  She checks her blood sugars *** times daily, preprandial to breakfast and ***. Katie Mcintyre has *** had hypoglycemic episodes since Katie last clinic visit, which typically occur *** x / - most often occuring ***. Katie Mcintyre is *** symptomatic with these episodes, with symptoms of {symptoms; hypoglycemia:[id number]}. Otherwise, Katie Mcintyre has not required any recent emergency interventions for hypoglycemia and has not had recent hospitalizations secondary to hyper or hypoglycemic episodes.    ROS: As per HPI and as detailed below: ROS    HOME DIABETES REGIMEN:   Humalog Mix to 12 units BID with meals   Metformin 1000 mg BID     METER DOWNLOAD SUMMARY: Date range evaluated: *** Fingerstick Blood Glucose Tests  = *** Average Number Tests/Day = *** Overall Mean FS Glucose = *** Standard Deviation = ***  BG Ranges: Low = *** High = ***   Hypoglycemic Events/30 Days: BG < 50 = *** Episodes of symptomatic severe hypoglycemia = ***    DIABETIC COMPLICATIONS: Microvascular complications:   Right eye retinopathy, hx of treatment of left eye when living in [place]  Denies: CKD,    Last eye exam: Completed 06/2019  Macrovascular complications:    Denies: CAD, PVD, CVA   HISTORY:  Past Medical History:  Past Medical History:  Diagnosis Date  . Anemia   . Arthritis   . Cancer ([place]) 1969   , Status post complete hysterectomy per Mcintyre  . Cataract    removed both eyes with lens implant both   . Chicken pox   . Chronic bronchitis ([place])   . Colon polyp   . Depression 06/30/2013  . Diabetes mellitus without complication ([place])   . Diverticulitis   . Frequent headaches   . GERD (gastroesophageal reflux disease)   . Gout 06/30/2013  . Hx of dizziness   . Hyperlipidemia   . Hypertension   . Post-operative nausea and vomiting   . S/P epidural steroid injection 09/27/2016   done in back at [place]   . Spinal stenosis of lumbar region    s/p spine surgery 02/12/13 with Dr Marcos [facility]ke at [facility]  . Thyroid disease   . Urine incontinence   . Use of cane as ambulatory aid     Past Surgical History:  Past Surgical History:  Procedure Laterality Date  . ABDOMINAL HYSTERECTOMY  1970   complete hysterectomy for cervical cancer  . BACK SURGERY  01/2013  . BACK SURGERY  1995  . CATARACT EXTRACTION, BILATERAL     with lens implant   . CERVICAL CONE BIOPSY  1970  . COLONOSCOPY    . EP IMPLANTABLE DEVICE N/A 02/15/2016   Procedure: Loop Recorder Insertion;  Surgeon: Sanda Klein, MD;  Location: [facility];  Service: Cardiovascular;  Laterality: N/A;  . POLYPECTOMY    . WRIST SURGERY  2010   for wrist fracture     Social History:  reports that she quit smoking about 54 years  ago. She has never used smokeless tobacco. She reports current alcohol use. She reports that she does not use drugs. Family History:  Family History  Problem Relation Age of Onset  . Alcohol abuse Father   . Cirrhosis Father   . Heart attack Brother   . Breast cancer Sister        Reports all family members with breast cancer have had genetic testing that was negative  . Cancer Mother        renal cancer  . Stroke Paternal Grandmother   . Alcohol abuse Brother   . Breast cancer Sister        renal cancer  . Diabetes Brother   . Heart Problems Daughter   . Healthy Daughter   . Healthy Daughter   . Healthy Son   . Healthy Son   . Arthritis Other        parents  . Hyperlipidemia Other        parent  . Hypertension Other        parent/grandparent  . Diabetes Other        parent/grandparent  . Heart disease Other   . Mental illness Other   . Colon cancer Neg Hx   . Esophageal cancer Neg Hx   . Prostate cancer Neg Hx   . Rectal cancer Neg Hx   . Seizures Neg Hx   . Colon polyps Neg Hx       HOME MEDICATIONS: Allergies as of 10/01/2019      Reactions   Advil [ibuprofen]    Was taking this for pain and had some ? Swollen lips   Lyrica [pregabalin]    Extreme swelling legs      Medication List       Accurate as of October 01, 2019  7:32 AM. If you have any questions, ask your nurse or doctor.        Accu-Chek Aviva Plus test strip Generic drug: glucose blood USE AS INSTRUCTED TO CHECK BLOOD SUGAR TWICE A DAY * E11.9*   Accu-Chek Aviva Soln Use as directed.   Accu-Chek FastClix Lancets Misc USE AS DIRECTED   accu-chek softclix lancets Use as instructed twice a day   acetaminophen 500 MG tablet Commonly known as: TYLENOL Take 1,000 mg by mouth every 8 (eight) hours as needed.   amLODipine 5 MG tablet Commonly known as: NORVASC Take 1 tablet by mouth once daily   atorvastatin 40 MG tablet Commonly known as: LIPITOR Take 40 mg by mouth daily.   B-D  UF III MINI PEN NEEDLES 31G X 5 MM Misc Generic drug: Insulin Pen Needle USE TO INJECT INSULIN TWICE A DAY   carvedilol 6.25 MG tablet Commonly known as: COREG Take 1 tablet (6.25 mg total) by mouth 2 (two) times daily.   cloNIDine 0.1 MG tablet Commonly known as: CATAPRES TAKE 1 TABLET  BY MOUTH TWICE A DAY   ferrous sulfate 325 (65 FE) MG tablet Take 325 mg by mouth daily with breakfast.   gabapentin 100 MG capsule Commonly known as: NEURONTIN Take 1 capsule (100 mg total) by mouth at bedtime.   hydrALAZINE 50 MG tablet Commonly known as: APRESOLINE Take 1 tablet (50 mg total) by mouth 3 (three) times daily.   Insulin Lispro Prot & Lispro (75-25) 100 UNIT/ML Kwikpen Commonly known as: HUMALOG 75/25 MIX Inject 12 Units into Katie skin 2 (two) times daily before a meal.   irbesartan 75 MG tablet Commonly known as: AVAPRO Take 2 tablets (150 mg total) by mouth daily.   isosorbide dinitrate 20 MG tablet Commonly known as: ISORDIL Take one tablet by mouth two times a day.   levothyroxine 125 MCG tablet Commonly known as: SYNTHROID TAKE 1 TABLET BY MOUTH EVERY DAY   metFORMIN 1000 MG tablet Commonly known as: GLUCOPHAGE TAKE 1 TABLET BY MOUTH TWICE DAILY WITH MEALS   Oscal 500/200 D-3 500-200 MG-UNIT tablet Generic drug: calcium-vitamin D Take 1 tablet by mouth daily.   STOOL SOFTENER & LAXATIVE PO Take 1 tablet by mouth daily as needed (constipation).   vitamin B-12 1000 MCG tablet Commonly known as: CYANOCOBALAMIN Take by mouth.        OBJECTIVE:   Vital Signs: There were no vitals taken for this visit.  Wt Readings from Last 3 Encounters:  07/18/19 258 lb (117 kg)  07/02/19 258 lb 12.8 oz (117.4 kg)  03/07/19 253 lb (114.8 kg)     Exam: General: Pt appears well and is in NAD  Lungs: Clear with good BS bilat with no rales, rhonchi, or wheezes  Heart: RRR with normal S1 and S2 and no gallops; no murmurs; no rub  Abdomen: Normoactive bowel sounds,  soft, nontender, without masses or organomegaly palpable  Extremities: No pretibial edema. No tremor. Normal strength and motion throughout. See detailed diabetic foot exam below.  Skin: Normal texture and temperature to palpation.   Neuro: MS is good with appropriate affect, pt is alert and Ox3      DM foot exam: 07/02/2019  Katie skin of Katie feet is intact without sores or ulcerations. Katie pedal pulses are 2+ on right and 2+ on left. Katie sensation is decreased to a screening 5.07, 10 gram monofilament bilaterally    DATA REVIEWED:  Lab Results  Component Value Date   HGBA1C 7.1 (A) 07/02/2019   HGBA1C 8.5 (H) 03/06/2019   HGBA1C 8.6 (H) 11/12/2018   Lab Results  Component Value Date   MICROALBUR 21.4 (H) 09/08/2014   LDLCALC 51 03/06/2019   CREATININE 0.90 03/06/2019   Lab Results  Component Value Date   MICRALBCREAT 10.3 09/08/2014     Lab Results  Component Value Date   CHOL 131 03/06/2019   HDL 65.50 03/06/2019   LDLCALC 51 03/06/2019   TRIG 72.0 03/06/2019   CHOLHDL 2 03/06/2019         ASSESSMENT / PLAN / RECOMMENDATIONS:   1) Type 2 Diabetes Mellitus, Suboptimally controlled, With Neuropathic, and retinopathic complications - Most recent A1c of 7.1 %. Goal A1c < 7.0 %.     Plan: MEDICATIONS:  ***  EDUCATION / INSTRUCTIONS:  BG monitoring instructions: Mcintyre is instructed to check her blood sugars *** times a day, ***.  Call [facility] if: BG persistently < 70 or > 300. . I reviewed Katie Rule of 15 for Katie treatment of hypoglycemia in detail with  Katie Mcintyre. Literature supplied.    2) Diabetic complications:   Eye: Does *** have known diabetic retinopathy.   Neuro/ Feet: Does *** have known diabetic peripheral neuropathy .   Renal: Mcintyre does *** have known baseline CKD. She   is *** on an ACEI/ARB at present. Check urine albumin/creatinine ratio yearly starting at time of diagnosis. If albuminuria is positive, treatment  is geared toward better glucose, blood pressure control and use of ACE inhibitors or ARBs. Monitor electrolytes and creatinine once to twice yearly.   3) Lipids: Mcintyre is *** on a statin.  4) Hypertension: *** at goal of < 140/90 mmHg.    F/U in ***    Signed electronically by: Mack Guise, MD  [facility]  [facility] [address] [place], [place] [address] Phone: [phone] FAX: [phone]   CC: Billie Ruddy, [place] [address] Phone: [phone]  Fax: [phone]  Return to Endocrinology clinic as below: Future Appointments  Date Time Provider [place]  10/01/2019 10:50 AM Natilee Gauer, Melanie Crazier, MD [facility] None  10/08/2019  8:30 AM Marzetta Board, DPM [facility]  10/20/2019 11:40 AM Sanda Klein, MD [facility]  10/31/2019 11:45 AM Hilty, Nadean Corwin, MD [facility]

## 2019-10-02 ENCOUNTER — Telehealth: Payer: Self-pay

## 2019-10-02 DIAGNOSIS — M5116 Intervertebral disc disorders with radiculopathy, lumbar region: Secondary | ICD-10-CM

## 2019-10-02 NOTE — Telephone Encounter (Signed)
New order for a Standard Rollator has been placed with [facility].

## 2019-10-02 NOTE — Telephone Encounter (Signed)
Plan for trns esi and can always do bursa instead.

## 2019-10-03 NOTE — Telephone Encounter (Signed)
Is auth needed for [phone]? Scheduled for 10/30.

## 2019-10-03 NOTE — Telephone Encounter (Signed)
[id number] Injection(s), anesthetic agent and/or st more  Notification/Prior Authorization not required if procedure performed in Office; otherwise may be required for this service

## 2019-10-08 ENCOUNTER — Ambulatory Visit: Payer: Medicare Other | Admitting: Podiatry

## 2019-10-08 ENCOUNTER — Encounter: Payer: Self-pay | Admitting: Podiatry

## 2019-10-08 ENCOUNTER — Other Ambulatory Visit: Payer: Self-pay

## 2019-10-08 ENCOUNTER — Encounter

## 2019-10-08 DIAGNOSIS — M79674 Pain in right toe(s): Secondary | ICD-10-CM

## 2019-10-08 DIAGNOSIS — B351 Tinea unguium: Secondary | ICD-10-CM

## 2019-10-08 DIAGNOSIS — E1142 Type 2 diabetes mellitus with diabetic polyneuropathy: Secondary | ICD-10-CM

## 2019-10-08 DIAGNOSIS — M79675 Pain in left toe(s): Secondary | ICD-10-CM | POA: Diagnosis not present

## 2019-10-08 NOTE — Progress Notes (Signed)
Subjective: Katie Mcintyre is a 78 y.o. y.o. female who presents for preventative diabetic foot care on today with cc of painful, discolored, thick toenails  which interfere with daily activities. Pain is aggravated when wearing enclosed shoe gear and relieved with periodic professional debridement.  She has h/o diabetic neuropathy on today's visit.  Billie Ruddy, MD is her PCP.   She voices no new pedal concerns on today's visit.  Current Outpatient Medications on File Prior to Visit  Medication Sig Dispense Refill  . ACCU-CHEK AVIVA PLUS test strip USE AS INSTRUCTED TO CHECK BLOOD SUGAR TWICE A DAY * E11.9* 100 each 5  . ACCU-CHEK FASTCLIX LANCETS MISC USE AS DIRECTED 102 each 5  . acetaminophen (TYLENOL) 500 MG tablet Take 1,000 mg by mouth every 8 (eight) hours as needed.    Marland Kitchen amLODipine (NORVASC) 5 MG tablet Take 1 tablet by mouth once daily 85 tablet 0  . atorvastatin (LIPITOR) 40 MG tablet Take 40 mg by mouth daily.    . B-D UF III MINI PEN NEEDLES 31G X 5 MM MISC USE TO INJECT INSULIN TWICE A DAY 100 each 3  . Blood Glucose Calibration (ACCU-CHEK AVIVA) SOLN Use as directed. 1 each 1  . calcium-vitamin D (OSCAL 500/200 D-3) 500-200 MG-UNIT per tablet Take 1 tablet by mouth daily.    . carvedilol (COREG) 6.25 MG tablet Take 1 tablet (6.25 mg total) by mouth 2 (two) times daily. 180 tablet 3  . cloNIDine (CATAPRES) 0.1 MG tablet TAKE 1 TABLET BY MOUTH TWICE A DAY 180 tablet 2  . DILT-XR 120 MG 24 hr capsule Take by mouth daily.    . ferrous sulfate 325 (65 FE) MG tablet Take 325 mg by mouth daily with breakfast.    . gabapentin (NEURONTIN) 100 MG capsule Take 1 capsule (100 mg total) by mouth at bedtime. 90 capsule 3  . hydrALAZINE (APRESOLINE) 50 MG tablet Take 1 tablet (50 mg total) by mouth 3 (three) times daily. 270 tablet 1  . Insulin Lispro Prot & Lispro (HUMALOG 75/25 MIX) (75-25) 100 UNIT/ML Kwikpen Inject 12 Units into the skin 2 (two) times daily before a meal. 9 mL 11   . irbesartan (AVAPRO) 75 MG tablet Take 2 tablets (150 mg total) by mouth daily. 180 tablet 1  . isosorbide dinitrate (ISORDIL) 20 MG tablet Take one tablet by mouth two times a day. 180 tablet 0  . Lancet Devices (ACCU-CHEK SOFTCLIX) lancets Use as instructed twice a day 1 each 5  . levothyroxine (SYNTHROID) 125 MCG tablet TAKE 1 TABLET BY MOUTH EVERY DAY 90 tablet 1  . metFORMIN (GLUCOPHAGE) 1000 MG tablet TAKE 1 TABLET BY MOUTH TWICE DAILY WITH MEALS 180 tablet 0  . Sennosides-Docusate Sodium (STOOL SOFTENER & LAXATIVE PO) Take 1 tablet by mouth daily as needed (constipation).     . vitamin B-12 (CYANOCOBALAMIN) 1000 MCG tablet Take by mouth.     Current Facility-Administered Medications on File Prior to Visit  Medication Dose Route Frequency Provider Last Rate Last Dose  . 0.9 %  sodium chloride infusion  500 mL Intravenous Once Pyrtle, Lajuan Lines, MD      . ipratropium-albuterol (DUONEB) 0.5-2.5 (3) MG/3ML nebulizer solution 3 mL  3 mL Nebulization Once Delano Metz, FNP        Allergies  Allergen Reactions  . Advil [Ibuprofen]     Was taking this for pain and had some ? Swollen lips  . Lyrica [Pregabalin]     Extreme  swelling legs    Objective: Vascular Examination: Capillary refill time immediate x 10 digits.  Dorsalis pedis pulses palpable b/l.  Posterior tibial pulses palpable b/l.  Digital hair absent b/l.  Skin temperature gradient WNL b/l.  Dermatological Examination: Skin thin, shiny and atrophic b/l.  Toenails 1-5 b/l discolored, thick, dystrophic with subungual debris and pain with palpation to nailbeds due to thickness of nails.  Pincer nail deformity b/l 3rd digits.  Musculoskeletal: Muscle strength 5/5 to all LE muscle groups b/l.  Neurological: Sensation absent b/l with 10 gram monofilament.  Vibratory sensation absent b/l.  Assessment: 1. Painful onychomycosis toenails 1-5 b/l 2.   NIDDM with neuropathy  Plan: 1. Continue diabetic foot care  principles. Literature dispensed on today. 2. Toenails 1-5 b/l were debrided in length and girth without iatrogenic bleeding. 3. Patient to continue soft, supportive shoe gear daily. 4. Patient to report any pedal injuries to medical professional immediately. 5. Follow up 3 months.  6. Patient/POA to call should there be a concern in the interim.

## 2019-10-08 NOTE — Patient Instructions (Signed)
Diabetes Mellitus and Foot Care Foot care is an important part of your health, especially when you have diabetes. Diabetes may cause you to have problems because of poor blood flow (circulation) to your feet and legs, which can cause your skin to:  Become thinner and drier.  Break more easily.  Heal more slowly.  Peel and crack. You may also have nerve damage (neuropathy) in your legs and feet, causing decreased feeling in them. This means that you may not notice minor injuries to your feet that could lead to more serious problems. Noticing and addressing any potential problems early is the best way to prevent future foot problems. How to care for your feet Foot hygiene  Wash your feet daily with warm water and mild soap. Do not use hot water. Then, pat your feet and the areas between your toes until they are completely dry. Do not soak your feet as this can dry your skin.  Trim your toenails straight across. Do not dig under them or around the cuticle. File the edges of your nails with an emery board or nail file.  Apply a moisturizing lotion or petroleum jelly to the skin on your feet and to dry, brittle toenails. Use lotion that does not contain alcohol and is unscented. Do not apply lotion between your toes. Shoes and socks  Wear clean socks or stockings every day. Make sure they are not too tight. Do not wear knee-high stockings since they may decrease blood flow to your legs.  Wear shoes that fit properly and have enough cushioning. Always look in your shoes before you put them on to be sure there are no objects inside.  To break in new shoes, wear them for just a few hours a day. This prevents injuries on your feet. Wounds, scrapes, corns, and calluses  Check your feet daily for blisters, cuts, bruises, sores, and redness. If you cannot see the bottom of your feet, use a mirror or ask someone for help.  Do not cut corns or calluses or try to remove them with medicine.  If you  find a minor scrape, cut, or break in the skin on your feet, keep it and the skin around it clean and dry. You may clean these areas with mild soap and water. Do not clean the area with peroxide, alcohol, or iodine.  If you have a wound, scrape, corn, or callus on your foot, look at it several times a day to make sure it is healing and not infected. Check for: ? Redness, swelling, or pain. ? Fluid or blood. ? Warmth. ? Pus or a bad smell. General instructions  Do not cross your legs. This may decrease blood flow to your feet.  Do not use heating pads or hot water bottles on your feet. They may burn your skin. If you have lost feeling in your feet or legs, you may not know this is happening until it is too late.  Protect your feet from hot and cold by wearing shoes, such as at the beach or on hot pavement.  Schedule a complete foot exam at least once a year (annually) or more often if you have foot problems. If you have foot problems, report any cuts, sores, or bruises to your health care provider immediately. Contact a health care provider if:  You have a medical condition that increases your risk of infection and you have any cuts, sores, or bruises on your feet.  You have an injury that is not   healing.  You have redness on your legs or feet.  You feel burning or tingling in your legs or feet.  You have pain or cramps in your legs and feet.  Your legs or feet are numb.  Your feet always feel cold.  You have pain around a toenail. Get help right away if:  You have a wound, scrape, corn, or callus on your foot and: ? You have pain, swelling, or redness that gets worse. ? You have fluid or blood coming from the wound, scrape, corn, or callus. ? Your wound, scrape, corn, or callus feels warm to the touch. ? You have pus or a bad smell coming from the wound, scrape, corn, or callus. ? You have a fever. ? You have a red line going up your leg. Summary  Check your feet every day  for cuts, sores, red spots, swelling, and blisters.  Moisturize feet and legs daily.  Wear shoes that fit properly and have enough cushioning.  If you have foot problems, report any cuts, sores, or bruises to your health care provider immediately.  Schedule a complete foot exam at least once a year (annually) or more often if you have foot problems. This information is not intended to replace advice given to you by your health care provider. Make sure you discuss any questions you have with your health care provider. Document Released: 12/08/2000 Document Revised: 01/23/2018 Document Reviewed: 01/12/2017 Elsevier Patient Education  2020 Elsevier Inc.  

## 2019-10-09 ENCOUNTER — Encounter: Payer: Self-pay | Admitting: Family Medicine

## 2019-10-10 LAB — HM DIABETES EYE EXAM

## 2019-10-11 ENCOUNTER — Other Ambulatory Visit: Payer: Self-pay | Admitting: Adult Health

## 2019-10-14 ENCOUNTER — Telehealth: Payer: Self-pay | Admitting: Cardiovascular Disease

## 2019-10-14 ENCOUNTER — Telehealth: Payer: Self-pay | Admitting: Internal Medicine

## 2019-10-14 MED ORDER — IRBESARTAN 75 MG PO TABS: 150.0000 mg | ORAL_TABLET | Freq: Every day | ORAL | 0 refills | Status: DC

## 2019-10-14 NOTE — Telephone Encounter (Signed)
New Message      *STAT* If patient is at the pharmacy, call can be transferred to refill team.   1. Which medications need to be refilled? (please list name of each medication and dose if known) Irbesartan 75mg    2. Which pharmacy/location (including street and city if local pharmacy) is medication to be sent to? [facility] on [address]  3. Do they need a 30 day or 90 day supply? 90 day suppley

## 2019-10-14 NOTE — Telephone Encounter (Signed)
New message   Patient needs a new prescription of   irbesartan (AVAPRO) 75 MG tablet    sent to [facility], [address] patient needs a 90 day prescription.

## 2019-10-14 NOTE — Telephone Encounter (Signed)
Medication sent yesterday to pharmacy- receipt confirmed by pharmacy.  For 90 day supply.

## 2019-10-14 NOTE — Telephone Encounter (Signed)
Left a message with the patient, per dpr, that the prescription has been sent in.

## 2019-10-20 ENCOUNTER — Other Ambulatory Visit: Payer: Self-pay

## 2019-10-20 ENCOUNTER — Encounter: Payer: Self-pay | Admitting: Cardiovascular Disease

## 2019-10-20 ENCOUNTER — Ambulatory Visit: Payer: Medicare Other | Admitting: Cardiovascular Disease

## 2019-10-20 VITALS — BP 138/74 | HR 66 | Ht 61.5 in | Wt 260.0 lb

## 2019-10-20 DIAGNOSIS — Z4509 Encounter for adjustment and management of other cardiac device: Secondary | ICD-10-CM

## 2019-10-20 DIAGNOSIS — I10 Essential (primary) hypertension: Secondary | ICD-10-CM | POA: Diagnosis not present

## 2019-10-20 NOTE — Patient Instructions (Signed)
Medication Instructions:  No changes *If you need a refill on your cardiac medications before your next appointment, please call your pharmacy*  Lab Work: None ordered If you have labs (blood work) drawn today and your tests are completely normal, you will receive your results only by: Marland Kitchen MyChart Message (if you have MyChart) OR . A paper copy in the mail If you have any lab test that is abnormal or we need to change your treatment, we will call you to review the results.  Testing/Procedures: None ordered  Follow-Up: As needed with Dr. Sallyanne Kuster

## 2019-10-20 NOTE — Progress Notes (Signed)
Cardiology Office Note:    Date:  10/20/2019   ID:  Katie Mcintyre, DOB 1941/01/15, MRN [id number]  PCP:  Billie Ruddy, MD  Cardiologist:  No primary care provider on file.  Electrophysiologist:  None   Referring MD: Billie Ruddy, MD   Chief Complaint  Patient presents with  . Follow-up    ILR at RRT    History of Present Illness:    Katie Mcintyre is a 78 y.o. female with a hx of near syncope leading to loop recorder implantation 3 years ago. The device has reached RRT and has not shown any arrhythmia that could cause syncope during that time. A few episodes of "atrial fibrillation" have been recorded, all of which appear to be sinus rhythm with frequent PACs. No VT and no bradycardia has been seen. She has not had syncope or recurrent near syncope since loop recorder implantation.  She had a recent telemedicine visit with Dr. Debara Pickett, when her other CV issues were discussed and is scheduled to see him again in November. 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  Past Medical History:  Diagnosis Date  . Anemia   . Arthritis   . Cancer ([place]) 1969   , Status post complete hysterectomy per patient  . Cataract    removed both eyes with lens implant both   . Chicken pox   . Chronic bronchitis ([place])   . Colon polyp   . Depression 06/30/2013  . Diabetes mellitus without complication ([place])   . Diverticulitis   . Frequent headaches   . GERD (gastroesophageal reflux disease)   . Gout 06/30/2013  . Hx of dizziness   . Hyperlipidemia   . Hypertension   . Post-operative nausea and vomiting   . S/P epidural steroid injection 09/27/2016   done in back at [facility]   . Spinal stenosis of lumbar region    s/p spine surgery 02/12/13 with Dr Marcos Eke [facility]  . Thyroid disease   . Urine incontinence   . Use of cane as ambulatory aid     Past Surgical History:  Procedure Laterality Date  . ABDOMINAL HYSTERECTOMY  1970   complete hysterectomy for cervical cancer  . BACK SURGERY  01/2013  . BACK SURGERY  1995  . CATARACT EXTRACTION, BILATERAL     with lens implant   . CERVICAL CONE BIOPSY  1970  . COLONOSCOPY    . EP IMPLANTABLE DEVICE N/A 02/15/2016   Procedure: Loop Recorder Insertion;  Surgeon: Sanda Klein, MD;  Location: [facility];  Service: Cardiovascular;  Laterality: N/A;  . POLYPECTOMY    . WRIST SURGERY  2010   for wrist fracture    Current Medications: Current Meds  Medication Sig  . ACCU-CHEK AVIVA PLUS test strip USE AS INSTRUCTED TO CHECK BLOOD SUGAR TWICE A DAY * E11.9*  . ACCU-CHEK FASTCLIX LANCETS MISC USE AS DIRECTED  . acetaminophen (TYLENOL) 500 MG tablet Take 1,000 mg by mouth every 8 (eight) hours as needed.  Marland Kitchen amLODipine (NORVASC) 5 MG tablet Take 1 tablet by mouth once daily  . atorvastatin (LIPITOR) 40 MG tablet Take 40 mg by mouth daily.  . B-D UF III MINI PEN NEEDLES 31G X 5 MM MISC USE TO INJECT INSULIN TWICE A DAY  . Blood Glucose Calibration (ACCU-CHEK AVIVA) SOLN Use as directed.  . calcium-vitamin D (OSCAL 500/200 D-3) 500-200 MG-UNIT per tablet Take 1 tablet by mouth daily.  Marland Kitchen  carvedilol (COREG) 6.25 MG tablet Take 1 tablet (6.25 mg total) by mouth 2 (two) times daily.  . cloNIDine (CATAPRES) 0.1 MG tablet TAKE 1 TABLET BY MOUTH TWICE A DAY  . ferrous sulfate 325 (65 FE) MG tablet Take 325 mg by mouth daily with breakfast.  . gabapentin (NEURONTIN) 100 MG capsule Take 1 capsule (100 mg total) by mouth at bedtime.  . hydrALAZINE (APRESOLINE) 50 MG tablet Take 1 tablet (50 mg total) by mouth 3 (three) times daily.  . Insulin Lispro Prot & Lispro (HUMALOG 75/25 MIX) (75-25) 100 UNIT/ML Kwikpen Inject 12 Units into the skin 2 (two) times daily before a meal.  . irbesartan (AVAPRO) 75 MG tablet Take 2 tablets (150 mg total) by mouth daily.  . isosorbide dinitrate (ISORDIL) 20 MG tablet Take one tablet by mouth two times a day.  Elmore Guise Devices (ACCU-CHEK SOFTCLIX)  lancets Use as instructed twice a day  . levothyroxine (SYNTHROID) 125 MCG tablet TAKE 1 TABLET BY MOUTH EVERY DAY  . metFORMIN (GLUCOPHAGE) 1000 MG tablet TAKE 1 TABLET BY MOUTH TWICE DAILY WITH MEALS  . Sennosides-Docusate Sodium (STOOL SOFTENER & LAXATIVE PO) Take 1 tablet by mouth daily as needed (constipation).   . vitamin B-12 (CYANOCOBALAMIN) 1000 MCG tablet Take by mouth.   Current Facility-Administered Medications for the 10/20/19 encounter (Office Visit) with Sanda Klein, MD  Medication  . 0.9 %  sodium chloride infusion  . ipratropium-albuterol (DUONEB) 0.5-2.5 (3) MG/3ML nebulizer solution 3 mL     Allergies:   Advil [ibuprofen] and Lyrica [pregabalin]   Social History   Socioeconomic History  . Marital status: Divorced    Spouse name: Not on file  . Number of children: 5  . Years of education: 69  . Highest education level: Not on file  Occupational History  . 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 Financial resource strain: Not hard at all  . Food insecurity    Worry: Never true    Inability: Never true  . Transportation needs    Medical: No    Non-medical: No  Tobacco Use  . Smoking status: Former Smoker    Quit date: 12/25/1964    Years since quitting: 54.8  . Smokeless tobacco: Never Used  . Tobacco comment: smoked 6 years / when she was in college   Substance and Sexual Activity  . Alcohol use: Yes    Comment: occ with steak dinner   . Drug use: No  . Sexual activity: Not on file  Lifestyle  . Physical activity    Days per week: 0 days    Minutes per session: 0 min  . Stress: Only a little  Relationships  . Social Herbalist on phone: Once a week    Gets together: Once a week    Attends religious service: Not on file    Active member of club or organization: No    Attends meetings of clubs or organizations: Never    Relationship status: Divorced  Other Topics Concern  . Not on file  Social History  Narrative   Work or School: retired Chief Financial Officer principal      Spiritual Beliefs: Christian      Lifestyle: exercising on regular visit; trying to work on diet      Epworth Sleepiness Scale = 20 (as of 02/02/2016)      03/06/2019:   Lives alone on 2 level house, but able to stay on main level  Oldest of 8 children, only 4 surviving   Has 5 grown children   Pt. moved to [place] to live with son, but son has relocated, and pt. Is looking to move in with daughter in [place] within the year   Enjoys doing crosswords, playing solitaire   Not very physically active d/t back pain, but hopes to return to doing stationary bicycle        Family History: The patient's family history includes Alcohol abuse in her brother and father; Arthritis in an other family member; Breast cancer in her sister and sister; Cancer in her mother; Cirrhosis in her father; Diabetes in her brother and another family member; Healthy in her daughter, daughter, son, and son; Heart Problems in her daughter; Heart attack in her brother; Heart disease in an other family member; Hyperlipidemia in an other family member; Hypertension in an other family member; Mental illness in an other family member; Stroke in her paternal grandmother. There is no history of Colon cancer, Esophageal cancer, Prostate cancer, Rectal cancer, Seizures, or Colon polyps.  ROS:   Please see the history of present illness.     All other systems reviewed and are negative.  EKGs/Labs/Other Studies Reviewed:    The following studies were reviewed today:   EKG:  EKG is ordered today.  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 , left axis deviation  Recent Labs: 03/06/2019: BUN 22; Creatinine, Ser 0.90; Potassium 4.6; Sodium 138 06/04/2019: Hemoglobin 11.7; Platelets 273.0; TSH 2.22  Recent Lipid Panel    Component Value Date/Time   CHOL 131 03/06/2019 1441   TRIG 72.0 03/06/2019 1441   HDL 65.50 03/06/2019 1441    CHOLHDL 2 03/06/2019 1441   VLDL 14.4 03/06/2019 1441   LDLCALC 51 03/06/2019 1441    Physical Exam:    VS:  BP 138/74   Pulse 66   Ht 5' 1.5" (1.562 m)   Wt 260 lb (117.9 kg)   SpO2 98%   BMI 48.33 kg/m     Wt Readings from Last 3 Encounters:  10/20/19 260 lb (117.9 kg)  07/18/19 258 lb (117 kg)  07/02/19 258 lb 12.8 oz (117.4 kg)     GEN: Morbidly obese. Well nourished, well developed in no acute distress HEENT: Normal NECK: No JVD; No carotid bruits LYMPHATICS: No lymphadenopathy CARDIAC: RRR, no murmurs, rubs, gallops RESPIRATORY:  Clear to auscultation without rales, wheezing or rhonchi  ABDOMEN: Soft, non-tender, non-distended MUSCULOSKELETAL:  No edema; No deformity  SKIN: Warm and dry NEUROLOGIC:  Alert and oriented x 3 PSYCHIATRIC:  Normal affect   ASSESSMENT:    1. Encounter for loop recorder at end of battery life   2. Hypertension, unspecified type   3. Morbid obesity ([place])    PLAN:    In order of problems listed above:  1. 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 She does not want it extracted at this time. 2. HTN: improved, close to target, but ideally SBP<130. 3. Morbid obesity:  She is upset that she has gained back all the weight lost.   Medication Adjustments/Labs and Tests Ordered: Current medicines are reviewed at length with the patient today.  Concerns regarding medicines are outlined above.  Orders Placed This Encounter  Procedures  . EKG 12-Lead   No orders of the defined types were placed in this encounter.   Patient Instructions  Medication Instructions:  No changes *If you need a refill on your cardiac  medications before your next appointment, please call your pharmacy*  Lab Work: None ordered If you have labs (blood work) drawn today and your tests are completely normal, you will receive your results only by: Marland Kitchen MyChart Message (if you have MyChart) OR . A paper copy in the mail If you have any lab test that is  abnormal or we need to change your treatment, we will call you to review the results.  Testing/Procedures: None ordered  Follow-Up: As needed with Dr. Sallyanne Kuster    Signed, Sanda Klein, MD  10/20/2019 4:11 PM    [place]

## 2019-10-24 ENCOUNTER — Other Ambulatory Visit: Payer: Self-pay

## 2019-10-24 ENCOUNTER — Encounter: Payer: Self-pay | Admitting: Physical Medicine and Rehabilitation

## 2019-10-24 ENCOUNTER — Ambulatory Visit: Payer: Self-pay

## 2019-10-24 ENCOUNTER — Ambulatory Visit (INDEPENDENT_AMBULATORY_CARE_PROVIDER_SITE_OTHER): Payer: Medicare Other | Admitting: Physical Medicine and Rehabilitation

## 2019-10-24 VITALS — BP 174/71 | HR 68

## 2019-10-24 DIAGNOSIS — M5416 Radiculopathy, lumbar region: Secondary | ICD-10-CM | POA: Diagnosis not present

## 2019-10-24 DIAGNOSIS — M48062 Spinal stenosis, lumbar region with neurogenic claudication: Secondary | ICD-10-CM

## 2019-10-24 MED ORDER — BETAMETHASONE SOD PHOS & ACET 6 (3-3) MG/ML IJ SUSP
12.0000 mg | Freq: Once | INTRAMUSCULAR | Status: AC
  Administered 2019-10-24: 12 mg

## 2019-10-24 NOTE — Progress Notes (Signed)
 .  Numeric Pain Rating Scale and Functional Assessment Average Pain 8   In the last MONTH (on 0-10 scale) has pain interfered with the following?  1. General activity like being  able to carry out your everyday physical activities such as walking, climbing stairs, carrying groceries, or moving a chair?  Rating(8)   +Driver, -BT, -Dye Allergies.  

## 2019-10-27 ENCOUNTER — Encounter: Payer: Medicare Other | Admitting: *Deleted

## 2019-10-28 NOTE — Procedures (Signed)
Lumbosacral Transforaminal Epidural Steroid Injection - Sub-Pedicular Approach with Fluoroscopic Guidance  [name]      Date of Birth: 05-03-1941 MRN: [id number] PCP: Billie Ruddy, MD      Visit Date: 10/24/2019   Universal Protocol:    Date/Time: 10/24/2019  Consent Given By: the patient  Position: PRONE  Additional Comments: Vital signs were monitored before and after the procedure. Patient was prepped and draped in the usual sterile fashion. The correct patient, procedure, and site was verified.   Injection Procedure Details:  Procedure Site One Meds Administered:  Meds ordered this encounter  Medications  . betamethasone acetate-betamethasone sodium phosphate (CELESTONE) injection 12 mg    Laterality: Bilateral  Location/Site:  L3-L4  Needle size: 22 G  Needle type: Spinal  Needle Placement: Transforaminal  Findings:    -Comments: Excellent flow of contrast along the nerve and into the epidural space.  Procedure Details: After squaring off the end-plates to get a true AP view, the C-arm was positioned so that an oblique view of the foramen as noted above was visualized. The target area is just inferior to the "nose of the scotty dog" or sub pedicular. The soft tissues overlying this structure were infiltrated with 2-3 ml. of 1% Lidocaine without Epinephrine.  The spinal needle was inserted toward the target using a "trajectory" view along the fluoroscope beam.  Under AP and lateral visualization, the needle was advanced so it did not puncture dura and was located close the 6 O'Clock position of the pedical in AP tracterory. Biplanar projections were used to confirm position. Aspiration was confirmed to be negative for CSF and/or blood. A 1-2 ml. volume of Isovue-250 was injected and flow of contrast was noted at each level. Radiographs were obtained for documentation purposes.   After attaining the desired flow of contrast documented above, a 0.5  to 1.0 ml test dose of 0.25% Marcaine was injected into each respective transforaminal space.  The patient was observed for 90 seconds post injection.  After no sensory deficits were reported, and normal lower extremity motor function was noted,   the above injectate was administered so that equal amounts of the injectate were placed at each foramen (level) into the transforaminal epidural space.   Additional Comments:  The patient tolerated the procedure well Dressing: 2 x 2 sterile gauze and Band-Aid    Post-procedure details: Patient was observed during the procedure. Post-procedure instructions were reviewed.  Patient left the clinic in stable condition.

## 2019-10-28 NOTE — Progress Notes (Signed)
Katie Mcintyre - 78 y.o. female MRN [id number]  Date of birth: [birth year]  Office Visit Note: Visit Date: 10/24/2019 PCP: Billie Ruddy, MD Referred by: Billie Ruddy, MD  Subjective: Chief Complaint  Patient presents with  . Lower Back - Pain  . Right Leg - Pain   HPI: Katie Mcintyre is a 78 y.o. female who comes in today For planned L3 transforaminal injection for severe stenosis at L3-4.  Patient is followed by Dr. Shellia Cleverly in our office.  We have completed injection in the past more for one-sided symptoms and she is done fairly well.  She is also had a history of ischial bursitis with pain in the buttock region.  Today she has pain in the right buttocks but down the right leg really into the foot.  Bilateral pain.  We are going to complete diagnostic hopefully therapeutic L3 transforaminal injection.  She will continue to follow-up with Dr. Louanne Skye.  Her case is complicated by diabetes and multiple medical issues.  She is thinking about moving to [place] to be near her family.  We will see her back as needed.  ROS Otherwise per HPI.  Assessment & Plan: Visit Diagnoses:  1. Lumbar radiculopathy   2. Spinal stenosis of lumbar region with neurogenic claudication     Plan: No additional findings.   Meds & Orders:  Meds ordered this encounter  Medications  . betamethasone acetate-betamethasone sodium phosphate (CELESTONE) injection 12 mg    Orders Placed This Encounter  Procedures  . XR C-ARM NO REPORT  . Epidural Steroid injection    Follow-up: Return if symptoms worsen or fail to improve.   Procedures: No procedures performed  Lumbosacral Transforaminal Epidural Steroid Injection - Sub-Pedicular Approach with Fluoroscopic Guidance  [name]      Date of Birth: [birth year] MRN: [id number] PCP: Billie Ruddy, MD      Visit Date: 10/24/2019   Universal Protocol:    Date/Time: 10/24/2019  Consent Given By: the patient  Position: PRONE   Additional Comments: Vital signs were monitored before and after the procedure. Patient was prepped and draped in the usual sterile fashion. The correct patient, procedure, and site was verified.   Injection Procedure Details:  Procedure Site One Meds Administered:  Meds ordered this encounter  Medications  . betamethasone acetate-betamethasone sodium phosphate (CELESTONE) injection 12 mg    Laterality: Bilateral  Location/Site:  L3-L4  Needle size: 22 G  Needle type: Spinal  Needle Placement: Transforaminal  Findings:    -Comments: Excellent flow of contrast along the nerve and into the epidural space.  Procedure Details: After squaring off the end-plates to get a true AP view, the C-arm was positioned so that an oblique view of the foramen as noted above was visualized. The target area is just inferior to the "nose of the scotty dog" or sub pedicular. The soft tissues overlying this structure were infiltrated with 2-3 ml. of 1% Lidocaine without Epinephrine.  The spinal needle was inserted toward the target using a "trajectory" view along the fluoroscope beam.  Under AP and lateral visualization, the needle was advanced so it did not puncture dura and was located close the 6 O'Clock position of the pedical in AP tracterory. Biplanar projections were used to confirm position. Aspiration was confirmed to be negative for CSF and/or blood. A 1-2 ml. volume of Isovue-250 was injected and flow of contrast was noted at each level. Radiographs were obtained for documentation purposes.  After attaining the desired flow of contrast documented above, a 0.5 to 1.0 ml test dose of 0.25% Marcaine was injected into each respective transforaminal space.  The patient was observed for 90 seconds post injection.  After no sensory deficits were reported, and normal lower extremity motor function was noted,   the above injectate was administered so that equal amounts of the injectate were placed at  each foramen (level) into the transforaminal epidural space.   Additional Comments:  The patient tolerated the procedure well Dressing: 2 x 2 sterile gauze and Band-Aid    Post-procedure details: Patient was observed during the procedure. Post-procedure instructions were reviewed.  Patient left the clinic in stable condition.    Clinical History: FINDINGS:  There is lumbosacral transitional anatomy. For the purpose of this dictation, the last well-formed disc space is designated as S1-S2 and labeled on image as such.  Minimal anterolisthesis of L4 on L5. Normal lumbar spinal alignment otherwise. Status post L3-L5 laminectomy. Marrow is unremarkable. Conus terminates at approximately L1. The visualized spinal cord morphology and signal, and the cauda equina appear normal. Negative for epidural hematoma. Small left renal cysts. Retroperitoneal soft tissues are unremarkable. Sacroiliac joints are normal.  --T11-T12: Imaged on the sagittal plane only. Large posterior disc osteophyte complex impression on ventral cord. No cord signal abnormality. Moderate bilateral neuroforaminal narrowing. --T12-L1: Mild posterior disc bulge without canal or foraminal narrowing. Mild bilateral facet degenerative disease.  --L1-L2: There is no evidence of spinal stenosis, disc bulge or neural foraminal narrowing. Mild left facet degenerative disease. --L2-L3: Large posterior disc osteophyte complex and severe bilateral facet hypertrophy, resulting in moderate spinal canal narrowing and severe bilateral neuroforamina narrowing.  --L3-L4: Broad-based disc bulge and severe bilateral facet hypertrophy, resulting in the severe spinal canal and bilateral neural foraminal narrowing. --L4-L5: Status post laminectomy. Broad based disc bulge and severe bilateral facet hypertrophy resulting in mild spinal canal narrowing and severe left and mild right neuroforaminal narrowing.  --L5-S1: Status post  laminectomy. No disc bulge or spinal canal narrowing. Severe bilateral neuroforamina narrowing. Severe bilateral facet hypertrophy   IMPRESSION: 1. Spinal canal narrowing is moderate at L2-L3, severe at L3-L4, and mild at L4-L5, mostly from degenerative facet disease. 2. Multilevel neuroforaminal narrowing, which is severe at bilateral L2-L3, bilateral L3-L4,, left L4-L5, and bilateral L5-S1.   Electronically Reviewed [id number] Roosevelt Locks, MD Electronically Reviewed on:09/09/2016 7:55 PM   She reports that she quit smoking about 54 years ago. She has never used smokeless tobacco.  Recent Labs    11/12/18 1205 03/06/19 1441 07/02/19 1103  HGBA1C 8.6* 8.5* 7.1*    Objective:  VS:  HT:    WT:   BMI:     BP:(!) 174/71  HR:68bpm  TEMP: ( )  RESP:  Physical Exam Constitutional:      General: She is not in acute distress.    Appearance: Normal appearance. She is obese. She is not ill-appearing.  HENT:     Head: Normocephalic and atraumatic.     Right Ear: External ear normal.     Left Ear: External ear normal.  Eyes:     Extraocular Movements: Extraocular movements intact.  Cardiovascular:     Rate and Rhythm: Normal rate.     Pulses: Normal pulses.  Musculoskeletal:     Right lower leg: No edema.     Left lower leg: No edema.     Comments: Patient has good distal strength with no pain over the greater trochanters.  No clonus or  focal weakness.  She is very slow to rise from a seated position does ambulate with aid.  Skin:    Findings: No erythema, lesion or rash.  Neurological:     General: No focal deficit present.     Mental Status: She is alert and oriented to person, place, and time.     Sensory: No sensory deficit.     Motor: No weakness or abnormal muscle tone.     Coordination: Coordination normal.  Psychiatric:        Mood and Affect: Mood normal.        Behavior: Behavior normal.     Ortho Exam Imaging: No results found.  Past  Medical/Family/Surgical/Social History: Medications & Allergies reviewed per EMR, new medications updated. Patient Active Problem List   Diagnosis Date Noted  . Spinal stenosis of lumbar region with neurogenic claudication 07/21/2019  . Post laminectomy syndrome 07/21/2019  . Type 2 diabetes mellitus with retinopathy of both eyes, with long-term current use of insulin ([place]) 07/02/2019  . Type 2 diabetes mellitus with diabetic polyneuropathy, with long-term current use of insulin ([place]) 07/02/2019  . Type 2 diabetes mellitus with hyperglycemia, with long-term current use of insulin ([place]) 07/02/2019  . Cellulitis of right leg 07/02/2019  . PAF (paroxysmal atrial fibrillation) ([place]) 11/12/2018  . Hyperlipidemia associated with type 2 diabetes mellitus ([place]) 11/12/2018  . Hypertension associated with diabetes ([place]) 02/28/2018  . Mild episode of recurrent major depressive disorder ([place]) 08/21/2017  . Morbid obesity ([place]) 01/15/2017  . Status post placement of implantable loop recorder 08/17/2016  . Syncope 02/02/2016  . Bradycardia 02/02/2016  . SOB (shortness of breath) 02/02/2016  . Pre-syncope 02/02/2016  . History of colonic polyps 01/11/2016  . Type 2 diabetes mellitus with circulatory disorder, with long-term current use of insulin ([place]) 01/11/2016  . Chronic back pain - managed by [facility] at [facility] 03/12/2014  . Gout 06/30/2013  . Hypothyroidism 06/30/2013  . Iron deficiency anemia 06/30/2013  . Venous stasis of lower extremity 01/30/2013  . Refusal of blood transfusion for reasons of conscience 01/30/2013  . Lumbar disc disease with radiculopathy 01/30/2013   Past Medical History:  Diagnosis Date  . Anemia   . Arthritis   . Cancer ([place]) 1969   , Status post complete hysterectomy per patient  . Cataract    removed both eyes with lens implant both   . Chicken pox   . Chronic bronchitis ([place])   . Colon polyp   . Depression 06/30/2013  . Diabetes mellitus without  complication ([place])   . Diverticulitis   . Frequent headaches   . GERD (gastroesophageal reflux disease)   . Gout 06/30/2013  . Hx of dizziness   . Hyperlipidemia   . Hypertension   . Post-operative nausea and vomiting   . S/P epidural steroid injection 09/27/2016   done in back at [place]   . Spinal stenosis of lumbar region    s/p spine surgery 02/12/13 with Dr Marcos Eke at [facility]  . Thyroid disease   . Urine incontinence   . Use of cane as ambulatory aid    Family History  Problem Relation Age of Onset  . Alcohol abuse Father   . Cirrhosis Father   . Heart attack Brother   . Breast cancer Sister        Reports all family members with breast cancer have had genetic testing that was negative  . Cancer Mother        renal cancer  .  Stroke Paternal Grandmother   . Alcohol abuse Brother   . Breast cancer Sister        renal cancer  . Diabetes Brother   . Heart Problems Daughter   . Healthy Daughter   . Healthy Daughter   . Healthy Son   . Healthy Son   . Arthritis Other        parents  . Hyperlipidemia Other        parent  . Hypertension Other        parent/grandparent  . Diabetes Other        parent/grandparent  . Heart disease Other   . Mental illness Other   . Colon cancer Neg Hx   . Esophageal cancer Neg Hx   . Prostate cancer Neg Hx   . Rectal cancer Neg Hx   . Seizures Neg Hx   . Colon polyps Neg Hx    Past Surgical History:  Procedure Laterality Date  . ABDOMINAL HYSTERECTOMY  1970   complete hysterectomy for cervical cancer  . BACK SURGERY  01/2013  . BACK SURGERY  1995  . CATARACT EXTRACTION, BILATERAL     with lens implant   . CERVICAL CONE BIOPSY  1970  . COLONOSCOPY    . EP IMPLANTABLE DEVICE N/A 02/15/2016   Procedure: Loop Recorder Insertion;  Surgeon: Sanda Klein, MD;  Location: [facility];  Service: Cardiovascular;  Laterality: N/A;  . POLYPECTOMY    . WRIST SURGERY  2010   for wrist fracture   Social History   Occupational History   . Occupation: Radiation protection practitioner    Comment: retired  Tobacco Use  . Smoking status: Former Smoker    Quit date: 12/25/1964    Years since quitting: 54.8  . Smokeless tobacco: Never Used  . Tobacco comment: smoked 6 years / when she was in college   Substance and Sexual Activity  . Alcohol use: Yes    Comment: occ with steak dinner   . Drug use: No  . Sexual activity: Not on file

## 2019-10-29 ENCOUNTER — Ambulatory Visit: Payer: Medicare Other | Admitting: Internal Medicine

## 2019-10-31 ENCOUNTER — Ambulatory Visit: Payer: Medicare Other | Admitting: Internal Medicine

## 2019-11-11 ENCOUNTER — Other Ambulatory Visit: Payer: Self-pay | Admitting: Family Medicine

## 2019-11-11 ENCOUNTER — Other Ambulatory Visit: Payer: Self-pay | Admitting: Adult Health

## 2019-11-11 ENCOUNTER — Other Ambulatory Visit: Payer: Self-pay

## 2019-11-11 ENCOUNTER — Other Ambulatory Visit: Payer: Self-pay | Admitting: Internal Medicine

## 2019-11-11 ENCOUNTER — Telehealth: Payer: Self-pay | Admitting: Family Medicine

## 2019-11-13 ENCOUNTER — Other Ambulatory Visit: Payer: Self-pay | Admitting: Family Medicine

## 2019-11-13 ENCOUNTER — Ambulatory Visit: Payer: Medicare Other | Admitting: Internal Medicine

## 2019-11-13 MED ORDER — ISOSORBIDE DINITRATE 20 MG PO TABS: ORAL_TABLET | ORAL | 2 refills | Status: DC

## 2019-11-13 NOTE — Telephone Encounter (Signed)
Requested medication (s) are due for refill today: yes  Requested medication (s) are on the active medication list: yes  Last refill:  08/15/2019  Future visit scheduled:no  Notes to clinic:  Review for refill Last filled by different provider than pcp   Requested Prescriptions  Pending Prescriptions Disp Refills   isosorbide dinitrate (ISORDIL) 20 MG tablet 180 tablet 0    Sig: Take one tablet by mouth two times a day.     Cardiovascular:  Nitrates Failed - 11/13/2019  8:52 AM      Failed - Last BP in normal range    BP Readings from Last 1 Encounters:  10/24/19 (!) 174/71         Passed - Last Heart Rate in normal range    Pulse Readings from Last 1 Encounters:  10/24/19 68         Passed - Valid encounter within last 12 months    Recent Outpatient Visits          1 month ago Cellulitis of right leg   Therapist, music at [facility], MD   4 months ago Cellulitis of right leg   Therapist, music at [facility], MD   5 months ago PAF (paroxysmal atrial fibrillation) ([place])   Therapist, music at [facility], MD   8 months ago Encounter for preventive health examination   Therapist, music at [facility], [place], DO   1 year ago Type 2 diabetes mellitus with other circulatory complication, with long-term current use of insulin ([place])   Therapist, music at [facility], Nickola Major, DO      Future Appointments            In 1 month Hilty, Nadean Corwin, MD [place], [facility]

## 2019-11-13 NOTE — Progress Notes (Deleted)
Name: Katie Mcintyre  Age/ Sex: 78 y.o., female   MRN/ DOB: [id number], 24-Mar-1941     PCP: Billie Ruddy, MD   Reason for Endocrinology Evaluation: Type 2 Diabetes Mellitus  Initial Endocrine Consultative Visit: 07/02/2019    PATIENT IDENTIFIER: Katie Mcintyre is a 78 y.o. female with a past medical history of Dyslipidemia, T2DM , PAF and chronic pain. The patient has followed with Endocrinology clinic since  for consultative assistance with management of her diabetes.  DIABETIC HISTORY:  Katie Mcintyre was diagnosed with DM*** ***, and started insulin therapy approximately *** years after diagnosis. ***. Her hemoglobin A1c has ranged from *** in ***, peaking at *** in ***.   SUBJECTIVE:   During the last visit (***): ***  Today (11/13/2019): Katie Mcintyre  She checks her blood sugars *** times daily, preprandial to breakfast and ***. The patient has *** had hypoglycemic episodes since the last clinic visit, which typically occur *** x / - most often occuring ***. The patient is *** symptomatic with these episodes, with symptoms of {symptoms; hypoglycemia:9084048}. Otherwise, the patient {HAS/HAS NOT:522402} required any recent emergency interventions for hypoglycemia and {HAS/HAS NOT:522402} had recent hospitalizations secondary to hyper or hypoglycemic episodes.    ROS: As per HPI and as detailed below: ROS    HOME DIABETES REGIMEN:    Statin: *** ACE-I/ARB: *** Prior Diabetic Education: ***   METER DOWNLOAD SUMMARY: Date range evaluated: *** Fingerstick Blood Glucose Tests = *** Average Number Tests/Day = *** Overall Mean FS Glucose = *** Standard Deviation = ***  BG Ranges: Low = *** High = ***   Hypoglycemic Events/30 Days: BG < 50 = *** Episodes of symptomatic severe hypoglycemia = ***    DIABETIC COMPLICATIONS: Microvascular complications:   ***  Denies:   Last Eye  Exam: Completed   Macrovascular complications:   ***  Denies: CAD, CVA, PVD   HISTORY:  Past Medical History:  Past Medical History:  Diagnosis Date  . Anemia   . Arthritis   . Cancer ([place]) 1969   , Status post complete hysterectomy per patient  . Cataract    removed both eyes with lens implant both   . Chicken pox   . Chronic bronchitis ([place])   . Colon polyp   . Depression 06/30/2013  . Diabetes mellitus without complication ([place])   . Diverticulitis   . Frequent headaches   . GERD (gastroesophageal reflux disease)   . Gout 06/30/2013  . Hx of dizziness   . Hyperlipidemia   . Hypertension   . Post-operative nausea and vomiting   . S/P epidural steroid injection 09/27/2016   done in back at [facility]   . Spinal stenosis of lumbar region    s/p spine surgery 02/12/13 with Dr Marcos Eke at [facility]  . Thyroid disease   . Urine incontinence   . Use of cane as ambulatory aid     Past Surgical History:  Past Surgical History:  Procedure Laterality Date  . ABDOMINAL HYSTERECTOMY  1970   complete hysterectomy for cervical cancer  . BACK SURGERY  01/2013  . BACK SURGERY  1995  . CATARACT EXTRACTION, BILATERAL     with lens implant   . CERVICAL CONE BIOPSY  1970  . COLONOSCOPY    . EP IMPLANTABLE DEVICE N/A 02/15/2016   Procedure: Loop Recorder Insertion;  Surgeon: Sanda Klein, MD;  Location: [facility];  Service: Cardiovascular;  Laterality: N/A;  . POLYPECTOMY    .  WRIST SURGERY  2010   for wrist fracture     Social History:  reports that she quit smoking about 54 years ago. She has never used smokeless tobacco. She reports current alcohol use. She reports that she does not use drugs. Family History:  Family History  Problem Relation Age of Onset  . Alcohol abuse Father   . Cirrhosis Father   . Heart attack Brother   . Breast cancer Sister        Reports all family members with breast cancer have had genetic testing that was negative  . Cancer Mother         renal cancer  . Stroke Paternal Grandmother   . Alcohol abuse Brother   . Breast cancer Sister        renal cancer  . Diabetes Brother   . Heart Problems Daughter   . Healthy Daughter   . Healthy Daughter   . Healthy Son   . Healthy Son   . Arthritis Other        parents  . Hyperlipidemia Other        parent  . Hypertension Other        parent/grandparent  . Diabetes Other        parent/grandparent  . Heart disease Other   . Mental illness Other   . Colon cancer Neg Hx   . Esophageal cancer Neg Hx   . Prostate cancer Neg Hx   . Rectal cancer Neg Hx   . Seizures Neg Hx   . Colon polyps Neg Hx       HOME MEDICATIONS: Allergies as of 11/13/2019      Reactions   Advil [ibuprofen]    Was taking this for pain and had some ? Swollen lips   Lyrica [pregabalin]    Extreme swelling legs      Medication List       Accurate as of November 13, 2019  7:32 AM. If you have any questions, ask your nurse or doctor.        Accu-Chek Aviva Plus test strip Generic drug: glucose blood USE AS INSTRUCTED TO CHECK BLOOD SUGAR TWICE A DAY * E11.9*   Accu-Chek Aviva Soln Use as directed.   Accu-Chek FastClix Lancets Misc USE AS DIRECTED   accu-chek softclix lancets Use as instructed twice a day   acetaminophen 500 MG tablet Commonly known as: TYLENOL Take 1,000 mg by mouth every 8 (eight) hours as needed.   amLODipine 5 MG tablet Commonly known as: NORVASC Take 1 tablet by mouth once daily   atorvastatin 40 MG tablet Commonly known as: LIPITOR TAKE 1 TABLET BY MOUTH EVERY DAY   B-D UF III MINI PEN NEEDLES 31G X 5 MM Misc Generic drug: Insulin Pen Needle USE TO INJECT INSULIN TWICE A DAY   carvedilol 6.25 MG tablet Commonly known as: COREG Take 1 tablet (6.25 mg total) by mouth 2 (two) times daily.   cloNIDine 0.1 MG tablet Commonly known as: CATAPRES TAKE 1 TABLET BY MOUTH TWICE A DAY   ferrous sulfate 325 (65 FE) MG tablet Take 325 mg by mouth daily with  breakfast.   gabapentin 100 MG capsule Commonly known as: NEURONTIN Take 1 capsule (100 mg total) by mouth at bedtime.   hydrALAZINE 50 MG tablet Commonly known as: APRESOLINE TAKE 1 TABLET BY MOUTH THREE TIMES A DAY   Insulin Lispro Prot & Lispro (75-25) 100 UNIT/ML Kwikpen Commonly known as: HUMALOG 75/25 MIX Inject 12 Units  into the skin 2 (two) times daily before a meal.   irbesartan 75 MG tablet Commonly known as: AVAPRO Take 2 tablets (150 mg total) by mouth daily.   isosorbide dinitrate 20 MG tablet Commonly known as: ISORDIL Take one tablet by mouth two times a day.   levothyroxine 125 MCG tablet Commonly known as: SYNTHROID TAKE 1 TABLET BY MOUTH EVERY DAY   metFORMIN 1000 MG tablet Commonly known as: GLUCOPHAGE TAKE 1 TABLET BY MOUTH TWICE DAILY WITH MEALS   Oscal 500/200 D-3 500-200 MG-UNIT tablet Generic drug: calcium-vitamin D Take 1 tablet by mouth daily.   STOOL SOFTENER & LAXATIVE PO Take 1 tablet by mouth daily as needed (constipation).   vitamin B-12 1000 MCG tablet Commonly known as: CYANOCOBALAMIN Take by mouth.        OBJECTIVE:   Vital Signs: There were no vitals taken for this visit.  Wt Readings from Last 3 Encounters:  10/20/19 260 lb (117.9 kg)  07/18/19 258 lb (117 kg)  07/02/19 258 lb 12.8 oz (117.4 kg)     Exam: General: Pt appears well and is in NAD  Hydration: Well-hydrated with moist mucous membranes and good skin turgor  HEENT: Head: Unremarkable with good dentition. Oropharynx clear without exudate.  Eyes: External eye exam normal without stare, lid lag or exophthalmos.  EOM intact.  PERRL.  Neck: General: Supple without adenopathy. Thyroid: Thyroid size normal.  No goiter or nodules appreciated. No thyroid bruit.  Lungs: Clear with good BS bilat with no rales, rhonchi, or wheezes  Heart: RRR with normal S1 and S2 and no gallops; no murmurs; no rub  Abdomen: Normoactive bowel sounds, soft, nontender, without masses or  organomegaly palpable  Extremities: No pretibial edema. No tremor. Normal strength and motion throughout. See detailed diabetic foot exam below.  Skin: Normal texture and temperature to palpation. No rash noted. No Acanthosis nigricans/skin tags. No lipohypertrophy.  Neuro: MS is good with appropriate affect, pt is alert and Ox3    DM foot exam: Please see diabetic assessment flow-sheet detailed below:           DATA REVIEWED:  Lab Results  Component Value Date   HGBA1C 7.1 (A) 07/02/2019   HGBA1C 8.5 (H) 03/06/2019   HGBA1C 8.6 (H) 11/12/2018   Lab Results  Component Value Date   MICROALBUR 21.4 (H) 09/08/2014   LDLCALC 51 03/06/2019   CREATININE 0.90 03/06/2019   Lab Results  Component Value Date   MICRALBCREAT 10.3 09/08/2014     Lab Results  Component Value Date   CHOL 131 03/06/2019   HDL 65.50 03/06/2019   LDLCALC 51 03/06/2019   TRIG 72.0 03/06/2019   CHOLHDL 2 03/06/2019         ASSESSMENT / PLAN / RECOMMENDATIONS:   1) Type {NUMBERS 1 OR 2:522190} Diabetes Mellitus, ***controlled, With *** complications - Most recent A1c of *** %. Goal A1c < *** %.  ***  Plan: MEDICATIONS:  ***  EDUCATION / INSTRUCTIONS:  BG monitoring instructions: Patient is instructed to check her blood sugars *** times a day, ***.  Call [facility] if: BG persistently < 70 or > 300. . I reviewed the Rule of 15 for the treatment of hypoglycemia in detail with the patient. Literature supplied.  REFERRALS:  ***.   2) Diabetic complications:   Eye: Does *** have known diabetic retinopathy.   Neuro/ Feet: Does *** have known diabetic peripheral neuropathy .   Renal: Patient does *** have known baseline  CKD. She   is *** on an ACEI/ARB at present. Check urine albumin/creatinine ratio yearly starting at time of diagnosis. If albuminuria is positive, treatment is geared toward better glucose, blood pressure control and use of ACE inhibitors or ARBs. Monitor  electrolytes and creatinine once to twice yearly.   3) Lipids: Patient is *** on a statin.  4) Hypertension: *** at goal of < 140/90 mmHg.    F/U in ***    Signed electronically by: Mack Guise, MD  [facility]  [facility] [address] [place], [place] [address] Phone: [phone] FAX: [phone]   CC: Billie Ruddy, [place] [address] Phone: [phone]  Fax: [phone]  Return to Endocrinology clinic as below: Future Appointments  Date Time Provider [place]  11/13/2019 10:50 AM , Melanie Crazier, MD [facility] None  12/31/2019  2:15 PM Pixie Casino, MD [facility]  01/13/2020  2:00 PM Marzetta Board, DPM [facility]

## 2019-11-13 NOTE — Telephone Encounter (Signed)
Medication Refill - Medicationisosorbide dinitrate (ISORDIL) 20 MG tablet  :   Has the patient contacted their pharmacy? No. (Agent: If no, request that the patient contact the pharmacy for the refill.) (Agent: If yes, when and what did the pharmacy advise?)  Preferred Pharmacy (with phone number or street name):  [id number] [address], [address]  [place] [address]  Phone: [phone] Fax: [phone]  Not a 24 hour pharmacy; exact hours not known.     Agent: Please be advised that RX refills may take up to 3 business days. We ask that you follow-up with your pharmacy.

## 2019-11-13 NOTE — Telephone Encounter (Signed)
Rx was refilled today

## 2019-11-13 NOTE — Telephone Encounter (Signed)
Pt called stating she is completely out of this medication. Please advise.

## 2019-12-04 ENCOUNTER — Telehealth: Payer: Self-pay | Admitting: Physical Medicine and Rehabilitation

## 2019-12-04 NOTE — Telephone Encounter (Signed)
[facility] but if needed sooner then [employer].

## 2019-12-04 NOTE — Telephone Encounter (Signed)
Scheduled for 12/22 with driver.

## 2019-12-16 ENCOUNTER — Ambulatory Visit: Payer: Self-pay

## 2019-12-16 ENCOUNTER — Encounter: Payer: Self-pay | Admitting: Physical Medicine and Rehabilitation

## 2019-12-16 ENCOUNTER — Ambulatory Visit: Payer: Medicare Other | Admitting: Physical Medicine and Rehabilitation

## 2019-12-16 ENCOUNTER — Other Ambulatory Visit: Payer: Self-pay

## 2019-12-16 VITALS — BP 188/85 | HR 73

## 2019-12-16 DIAGNOSIS — M5416 Radiculopathy, lumbar region: Secondary | ICD-10-CM | POA: Diagnosis not present

## 2019-12-16 DIAGNOSIS — G8929 Other chronic pain: Secondary | ICD-10-CM

## 2019-12-16 DIAGNOSIS — R202 Paresthesia of skin: Secondary | ICD-10-CM

## 2019-12-16 DIAGNOSIS — M48062 Spinal stenosis, lumbar region with neurogenic claudication: Secondary | ICD-10-CM

## 2019-12-16 DIAGNOSIS — M961 Postlaminectomy syndrome, not elsewhere classified: Secondary | ICD-10-CM

## 2019-12-16 DIAGNOSIS — M25511 Pain in right shoulder: Secondary | ICD-10-CM | POA: Diagnosis not present

## 2019-12-16 DIAGNOSIS — M79601 Pain in right arm: Secondary | ICD-10-CM

## 2019-12-16 MED ORDER — DEXAMETHASONE SODIUM PHOSPHATE 10 MG/ML IJ SOLN
15.0000 mg | Freq: Once | INTRAMUSCULAR | Status: AC
  Administered 2019-12-16: 15 mg

## 2019-12-16 NOTE — Progress Notes (Signed)
Left Arm pain and numbness, Pt states pain in the lower back mostly on the right side that radiates into the right buttocks and down the right leg. Pt states last injection 10/24/2019 lasted for about 3 weeks and it helped some. Pt states sitting in a hard chair makes pain worse. Stretching legs out helps with pain.   .Numeric Pain Rating Scale and Functional Assessment Average Pain 10   In the last MONTH (on 0-10 scale) has pain interfered with the following?  1. General activity like being  able to carry out your everyday physical activities such as walking, climbing stairs, carrying groceries, or moving a chair?  Rating(10)   +Driver, -BT, -Dye Allergies.

## 2019-12-16 NOTE — Procedures (Signed)
Lumbosacral Transforaminal Epidural Steroid Injection - Sub-Pedicular Approach with Fluoroscopic Guidance  [name]      Date of Birth: [birth year] MRN: [id number] PCP: Billie Ruddy, MD      Visit Date: 12/16/2019   Universal Protocol:    Date/Time: 12/16/2019  Consent Given By: the patient  Position: PRONE  Additional Comments: Vital signs were monitored before and after the procedure. Patient was prepped and draped in the usual sterile fashion. The correct patient, procedure, and site was verified.   Injection Procedure Details:  Procedure Site One Meds Administered:  Meds ordered this encounter  Medications  . dexamethasone (DECADRON) injection 15 mg    Laterality: Right  Location/Site:  L3-L4  Needle size: 40 G  Needle type: Spinal  Needle Placement: Transforaminal  Findings:    -Comments: Excellent flow of contrast along the nerve and into the epidural space.  Procedure Details: After squaring off the end-plates to get a true AP view, the C-arm was positioned so that an oblique view of the foramen as noted above was visualized. The target area is just inferior to the "nose of the scotty dog" or sub pedicular. The soft tissues overlying this structure were infiltrated with 2-3 ml. of 1% Lidocaine without Epinephrine.  The spinal needle was inserted toward the target using a "trajectory" view along the fluoroscope beam.  Under AP and lateral visualization, the needle was advanced so it did not puncture dura and was located close the 6 O'Clock position of the pedical in AP tracterory. Biplanar projections were used to confirm position. Aspiration was confirmed to be negative for CSF and/or blood. A 1-2 ml. volume of Isovue-250 was injected and flow of contrast was noted at each level. Radiographs were obtained for documentation purposes.   After attaining the desired flow of contrast documented above, a 0.5 to 1.0 ml test dose of 0.25% Marcaine was  injected into each respective transforaminal space.  The patient was observed for 90 seconds post injection.  After no sensory deficits were reported, and normal lower extremity motor function was noted,   the above injectate was administered so that equal amounts of the injectate were placed at each foramen (level) into the transforaminal epidural space.   Additional Comments:  The patient tolerated the procedure well Dressing: 2 x 2 sterile gauze and Band-Aid    Post-procedure details: Patient was observed during the procedure. Post-procedure instructions were reviewed.  Patient left the clinic in stable condition.

## 2019-12-24 ENCOUNTER — Telehealth: Payer: Self-pay | Admitting: Specialist

## 2019-12-24 NOTE — Telephone Encounter (Signed)
Pt called in said she is in a lot of pain and her appt isn't until 01-13-2019 and she is wondering if you can recommenced her something to take or do to help her pain until the appt?  [phone]

## 2019-12-30 ENCOUNTER — Other Ambulatory Visit: Payer: Self-pay | Admitting: Family Medicine

## 2019-12-31 ENCOUNTER — Ambulatory Visit (INDEPENDENT_AMBULATORY_CARE_PROVIDER_SITE_OTHER): Payer: Medicare PPO | Admitting: Internal Medicine

## 2019-12-31 ENCOUNTER — Other Ambulatory Visit: Payer: Self-pay

## 2019-12-31 ENCOUNTER — Encounter: Payer: Self-pay | Admitting: Internal Medicine

## 2019-12-31 ENCOUNTER — Ambulatory Visit: Payer: Medicare Other | Admitting: Internal Medicine

## 2019-12-31 VITALS — BP 138/68 | HR 69 | Ht 61.5 in | Wt 258.6 lb

## 2019-12-31 DIAGNOSIS — R55 Syncope and collapse: Secondary | ICD-10-CM | POA: Diagnosis not present

## 2019-12-31 DIAGNOSIS — I480 Paroxysmal atrial fibrillation: Secondary | ICD-10-CM | POA: Diagnosis not present

## 2019-12-31 NOTE — Progress Notes (Signed)
OFFICE NOTE  Chief Complaint:  Follow-up hypertension  Primary Care Physician: Billie Ruddy, MD  HPI:  Katie Mcintyre is a pleasant 79 year old female with a number of cardiovascular risk factors including insulin-dependent diabetes, dyslipidemia, hypertension and morbid obesity. She recently described a couple of episodes when she was driving of presyncope/syncope. She was able to maintain control of the car but basically started to lose consciousness and was in a very afraid. She does not believe this is related to blood sugars although she said that she personally increased her Lantus around that time thinking that her blood sugars my been too high. Her primary care provider then decrease that. She denies any symptoms of heart racing or chest pain. She has reported some shortness of breath which is been progressively worse. An EKG in her primary care provider's office shows sinus bradycardia, left atrial enlargement and voltage criteria for LVH with nonspecific T-wave changes. Heart rate is 50.   Mrs. Westberry returns today for follow-up of her loop recorder. The device interrogation indicated 1 possible high rate episode for which she said she had some presyncopal symptoms while driving. There were 2 bradycardic episodes which were not captured due to under sensing. Neither of these episodes however were associated with her syncopal episode. Please refer to the device interrogation note, which is copied below for description of a recent event that she had which was witnessed during her device interrogation in our office:  "During appointment, noted that patient's eyes began to droop bilaterally and she stopped verbally responding mid-conversation. Patient had been sitting up in chair but was slumped with her chin resting on her chest by this time. Patient did not appear to lose consciousness, but her eyes remained half-open. Attempted orientation questions with patient, but she did not  verbally respond. Called patient's name and patient was able to fully open her eyes on command, but she had difficulty making eye contact initially. Elevated leg rest on chair and obtained vital signs--BP 182/81, HR 74, O2 sat 98%, CBG 145. By this time, 2-2min had passed. Patient was alert and oriented at this point. Patient reports that these are the same episodes she has been having. No rhythm abnormalities noted during this episode. She reports that she suddenly feels like she has no control over her body and she feels herself drifting away. Patient strongly advised to avoid driving unless instructed otherwise by a physician and she verbalizes understanding of instructions. Reviewed episode and vital signs with Dr. Tamala Julian (DOD) who advised that patient seek treatment at the ED as she is stable now. Patient declines transport to the hospital at this time as she feels these episodes are ongoing. Encouraged patient to seek follow-up immediately and patient states that she has upcoming appointments with Dr. Debara Pickett and Dr. Maudie Mercury. Advised patient to discuss possible neuro consult with PCP and patient verbalizes understanding of instructions."  08/17/2016  Mrs. Ballek returns today for follow-up. She reports no further episodes of presyncope. She's also had negative workup so far is to the etiology for this. EEG was negative. Her remote pacer checks of her loop recorder do not demonstrate any significant arrhythmias. I'm still concerned that she may have sleep disorder such as sleep apnea or narcolepsy. She is scheduled for sleep study at the end of September. Have highly encouraged her to keep this appointment.  05/08/2018  Mrs. Teska was seen today in follow-up.  Recently she saw Leonia Reader, DNP, for elevated blood pressure.  She also  reports chronic dyspnea on exertion and fatigue.  She had an echocardiogram which showed normal systolic function and mild diastolic dysfunction.  A sleep study was  ordered but has not yet been obtained.  Her hydralazine was increased to 50 mg 3 times daily.  Blood pressure initially was elevated today however came down to 146/80 on recheck.  She says she does not follow her blood pressures at home although her cuff is accurate and was checked with our cuff here.  11/05/2018  Mrs. Timp is seen today in follow-up.  Blood pressure continues to be elevated.  She has had no further syncopal episodes however her loop recorder's have indicated brief episodes of may be paroxysmal atrial tachycardia, possibly PAF.  Most of the episodes of been less than 6 minutes however more recently she had one more than 10 minutes.  She seems to be asymptomatic with these.  This was not the initial reason for her loop recorder.  I do not believe these episodes were causing her syncope.  We did discuss the possibility of A. fib and how it may increase her risk of stroke, particularly episodes that lasts more than 6 minutes.  However at this point I would not recommend anticoagulation.  Blood pressure still is poorly controlled.  She did have a sleep study which did not show definite sleep apnea however she has had ongoing fatigue and symptoms which are going to be further evaluated by a nocturnal polysomnogram.   12/31/2019  Mrs. Shiflett returns today for follow-up.  I saw her last via telemedicine visit and she was noted to be hypertensive.  She has had no further syncopal episodes and saw Dr. Loletha Grayer in the fall and was noted to have and a battery life for her loop recorder.  EKG today shows sinus rhythm with occasional PVCs and left anterior fascicular block at 69.  Her blood pressure was notably elevated initially however recheck came down to 138/68.  This is consistent with prior office visits indicating that her medication adjustments have been generally successful.  Her lipids also were improved with an LDL 51 as of March 2020.  PMHx:  Past Medical History:  Diagnosis Date   Anemia     Arthritis    Cancer ([facility] [place]) 1969   , Status post complete hysterectomy per patient   Cataract    removed both eyes with lens implant both    Chicken pox    Chronic bronchitis ([facility] [place])    Colon polyp    Depression 06/30/2013   Diabetes mellitus without complication ([place])    Diverticulitis    Frequent headaches    GERD (gastroesophageal reflux disease)    Gout 06/30/2013   Hx of dizziness    Hyperlipidemia    Hypertension    Post-operative nausea and vomiting    S/P epidural steroid injection 09/27/2016   done in back at [facility]    Spinal stenosis of lumbar region    s/p spine surgery 02/12/13 with Dr Marcos Eke at [facility]   Thyroid disease    Urine incontinence    Use of cane as ambulatory aid     Past Surgical History:  Procedure Laterality Date   ABDOMINAL HYSTERECTOMY  1970   complete hysterectomy for cervical cancer   BACK SURGERY  01/2013   BACK SURGERY  1995   CATARACT EXTRACTION, BILATERAL     with lens implant    CERVICAL CONE BIOPSY  1970   COLONOSCOPY     EP IMPLANTABLE DEVICE N/A 02/15/2016  Procedure: Loop Recorder Insertion;  Surgeon: Sanda Klein, MD;  Location: [facility];  Service: Cardiovascular;  Laterality: N/A;   POLYPECTOMY     WRIST SURGERY  2010   for wrist fracture    FAMHx:  Family History  Problem Relation Age of Onset   Alcohol abuse Father    Cirrhosis Father    Heart attack Brother    Breast cancer Sister        Reports all family members with breast cancer have had genetic testing that was negative   Cancer Mother        renal cancer   Stroke Paternal Grandmother    Alcohol abuse Brother    Breast cancer Sister        renal cancer   Diabetes Brother    Heart Problems Daughter    Healthy Daughter    Healthy Daughter    Healthy Son    Healthy Son    Arthritis Other        parents   Hyperlipidemia Other        parent   Hypertension Other        parent/grandparent   Diabetes Other         parent/grandparent   Heart disease Other    Mental illness Other    Colon cancer Neg Hx    Esophageal cancer Neg Hx    Prostate cancer Neg Hx    Rectal cancer Neg Hx    Seizures Neg Hx    Colon polyps Neg Hx     SOCHx:   reports that she quit smoking about 55 years ago. She has never used smokeless tobacco. She reports current alcohol use. She reports that she does not use drugs.  ALLERGIES:  Allergies  Allergen Reactions   Advil [Ibuprofen]     Was taking this for pain and had some ? Swollen lips   Lyrica [Pregabalin]     Extreme swelling legs    ROS: Pertinent items noted in HPI and remainder of comprehensive ROS otherwise negative.  HOME MEDS: Current Outpatient Medications  Medication Sig Dispense Refill   ACCU-CHEK AVIVA PLUS test strip USE AS INSTRUCTED TO CHECK BLOOD SUGAR TWICE A DAY * E11.9* 100 each 5   ACCU-CHEK FASTCLIX LANCETS MISC USE AS DIRECTED 102 each 5   acetaminophen (TYLENOL) 500 MG tablet Take 1,000 mg by mouth every 8 (eight) hours as needed.     amLODipine (NORVASC) 5 MG tablet Take 1 tablet by mouth once daily 85 tablet 0   atorvastatin (LIPITOR) 40 MG tablet TAKE 1 TABLET BY MOUTH EVERY DAY 90 tablet 3   B-D UF III MINI PEN NEEDLES 31G X 5 MM MISC USE TO INJECT INSULIN TWICE A DAY 100 each 3   Blood Glucose Calibration (ACCU-CHEK AVIVA) SOLN Use as directed. 1 each 1   calcium-vitamin D (OSCAL 500/200 D-3) 500-200 MG-UNIT per tablet Take 1 tablet by mouth daily.     carvedilol (COREG) 6.25 MG tablet Take 1 tablet (6.25 mg total) by mouth 2 (two) times daily. 180 tablet 3   cloNIDine (CATAPRES) 0.1 MG tablet TAKE 1 TABLET BY MOUTH TWICE A DAY 180 tablet 2   ferrous sulfate 325 (65 FE) MG tablet Take 325 mg by mouth daily with breakfast.     gabapentin (NEURONTIN) 100 MG capsule Take 1 capsule (100 mg total) by mouth at bedtime. 90 capsule 3   hydrALAZINE (APRESOLINE) 50 MG tablet TAKE 1 TABLET BY MOUTH THREE TIMES  A DAY 270  tablet 3   Insulin Lispro Prot & Lispro (HUMALOG 75/25 MIX) (75-25) 100 UNIT/ML Kwikpen Inject 12 Units into the skin 2 (two) times daily before a meal. 9 mL 11   irbesartan (AVAPRO) 75 MG tablet Take 2 tablets (150 mg total) by mouth daily. 180 tablet 0   isosorbide dinitrate (ISORDIL) 20 MG tablet Take one tablet by mouth two times a day. 180 tablet 2   Lancet Devices (ACCU-CHEK SOFTCLIX) lancets Use as instructed twice a day 1 each 5   levothyroxine (SYNTHROID) 125 MCG tablet TAKE 1 TABLET BY MOUTH EVERY DAY 90 tablet 1   metFORMIN (GLUCOPHAGE) 1000 MG tablet TAKE 1 TABLET BY MOUTH TWICE A DAY WITH MEALS 180 tablet 0   Sennosides-Docusate Sodium (STOOL SOFTENER & LAXATIVE PO) Take 1 tablet by mouth daily as needed (constipation).      vitamin B-12 (CYANOCOBALAMIN) 1000 MCG tablet Take by mouth.     Current Facility-Administered Medications  Medication Dose Route Frequency Provider Last Rate Last Admin   0.9 %  sodium chloride infusion  500 mL Intravenous Once Pyrtle, Lajuan Lines, MD       ipratropium-albuterol (DUONEB) 0.5-2.5 (3) MG/3ML nebulizer solution 3 mL  3 mL Nebulization Once Delano Metz, FNP        LABS/IMAGING: No results found for this or any previous visit (from the past 48 hour(s)). No results found.  WEIGHTS: Wt Readings from Last 3 Encounters:  12/31/19 258 lb 9.6 oz (117.3 kg)  10/20/19 260 lb (117.9 kg)  07/18/19 258 lb (117 kg)    VITALS: BP 138/68    Pulse 69    Ht 5' 1.5" (1.562 m)    Wt 258 lb 9.6 oz (117.3 kg)    SpO2 98%    BMI 48.07 kg/m   EXAM: General appearance: alert, no distress and morbidly obese Neck: no carotid bruit and no JVD Lungs: clear to auscultation bilaterally Heart: regular rate and rhythm Abdomen: soft, non-tender; bowel sounds normal; no masses,  no organomegaly and morbidly obese Extremities: edema trace sockline edema and venous stasis dermatitis noted Pulses: 2+ and symmetric Skin: Skin color, texture, turgor normal.  No rashes or lesions Neurologic: Grossly normal Psych: Pleasant  EKG: Sinus rhythm with PVCs and left anterior fascicular block at 69-personally reviewed  ASSESSMENT: 1. Essential hypertension-controlled 2. Bradycardia 3. Progressive dyspnea and exertion -LVEF 60 to 65%, moderate LVH, grade 1 diastolic dysfunction  4. morbid obesity 5. Dyslipidemia 6. Insulin-dependent diabetes 7. 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 Syncope- s/p ILR (battery exhuasted)  PLAN: 1.   Mrs. Aloia has had improvement and is stable blood pressure on her current medications.  She denies any further syncopal episodes.  Her loop recorder is end-of-life and she does not want it extracted.  She needs to continue to work on weight loss.  No further changes to her medicines today.  Follow-up in 6 months or sooner as necessary.  Pixie Casino, MD, [facility], [place] Director of the Advanced Lipid Disorders &  Cardiovascular Risk Reduction Clinic Diplomate of the American Board of Clinical Lipidology Attending Cardiologist  Direct Dial: [phone]   Fax: [phone]  Website:  [email or web address] 12/31/2019, 2:30 PM

## 2019-12-31 NOTE — Patient Instructions (Signed)
Medication Instructions:  NO CHANGES *If you need a refill on your cardiac medications before your next appointment, please call your pharmacy*  Lab Work: NONE ORDERED  Testing/Procedures: NONE ORDERED  Follow-Up: At [facility], you and your health needs are our priority.  As part of our continuing mission to provide you with exceptional heart care, we have created designated Provider Care Teams.  These Care Teams include your primary Cardiologist (physician) and Advanced Practice Providers (APPs -  Physician Assistants and Nurse Practitioners) who all work together to provide you with the care you need, when you need it.  Your next appointment:   6 month(s)  The format for your next appointment:   In Person  Provider:   K. Mali Hilty, MD

## 2020-01-01 NOTE — Telephone Encounter (Signed)
I called and advised she could take OTC pain meds, try ice, or heat.

## 2020-01-01 NOTE — Telephone Encounter (Signed)
Pt is requesting refill on Rx listed below, pleas advise

## 2020-01-05 ENCOUNTER — Other Ambulatory Visit: Payer: Self-pay

## 2020-01-05 ENCOUNTER — Encounter: Payer: Self-pay | Admitting: Specialist

## 2020-01-05 ENCOUNTER — Ambulatory Visit: Payer: Medicare PPO | Admitting: Specialist

## 2020-01-05 ENCOUNTER — Ambulatory Visit: Payer: Self-pay

## 2020-01-05 VITALS — BP 173/73 | Ht 61.5 in | Wt 258.0 lb

## 2020-01-05 DIAGNOSIS — M542 Cervicalgia: Secondary | ICD-10-CM

## 2020-01-05 DIAGNOSIS — M4156 Other secondary scoliosis, lumbar region: Secondary | ICD-10-CM

## 2020-01-05 DIAGNOSIS — M4316 Spondylolisthesis, lumbar region: Secondary | ICD-10-CM | POA: Diagnosis not present

## 2020-01-05 DIAGNOSIS — M5116 Intervertebral disc disorders with radiculopathy, lumbar region: Secondary | ICD-10-CM

## 2020-01-05 DIAGNOSIS — R601 Generalized edema: Secondary | ICD-10-CM

## 2020-01-05 DIAGNOSIS — I872 Venous insufficiency (chronic) (peripheral): Secondary | ICD-10-CM | POA: Diagnosis not present

## 2020-01-05 DIAGNOSIS — R202 Paresthesia of skin: Secondary | ICD-10-CM

## 2020-01-05 DIAGNOSIS — M48062 Spinal stenosis, lumbar region with neurogenic claudication: Secondary | ICD-10-CM | POA: Diagnosis not present

## 2020-01-05 DIAGNOSIS — R200 Anesthesia of skin: Secondary | ICD-10-CM

## 2020-01-05 DIAGNOSIS — G5622 Lesion of ulnar nerve, left upper limb: Secondary | ICD-10-CM | POA: Diagnosis not present

## 2020-01-05 DIAGNOSIS — M5010 Cervical disc disorder with radiculopathy, unspecified cervical region: Secondary | ICD-10-CM | POA: Diagnosis not present

## 2020-01-05 MED ORDER — ACETAMINOPHEN-CODEINE #3 300-30 MG PO TABS: 1.0000 | ORAL_TABLET | ORAL | 0 refills | Status: AC | PRN

## 2020-01-05 NOTE — Progress Notes (Signed)
Office Visit Note   [name]           Date of Birth: August 30, 1941           MRN: [id number] Visit Date: 01/05/2020              Requested by: Billie Ruddy, MD [place],  [place] [id number] PCP: Billie Ruddy, MD   Assessment & Plan: Visit Diagnoses:  1. Cervicalgia   2. Numbness and tingling in left arm   3. Cubital tunnel syndrome on left   4. Spinal stenosis of lumbar region with neurogenic claudication   5. Other secondary scoliosis, lumbar region   6. Spondylolisthesis, lumbar region   7. Lumbar disc disease with radiculopathy   8. Cervical disc disorder with radiculopathy, unspecified cervical region   9. Venous stasis dermatitis of both lower extremities   10. Generalized edema     Plan: Avoid overhead lifting and overhead use of the arms. Do not lift greater than 5 lbs. Adjust head rest in vehicle to prevent hyperextension if rear ended. Take extra precautions to avoid falling, including use of a cane if you feel weak. Avoid bending, stooping and avoid lifting weights greater than 10 lbs. Avoid prolong standing and walking. Avoid frequent bending and stooping  No lifting greater than 10 lbs. May use ice or moist heat for pain. Weight loss is of benefit. Handicap license is approved. 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 Referral to Dr. Ernestina Patches for EMG/NCV left arm assess for cubital tunnel and CTS vs cervical radiculopathy. Referral for MRI of the cervicsl spine due to cervical radiculopathy.  Tylenol #3 for pain one every 6 hours prn pain. Fall Prevention and Home Safety Falls cause injuries and can affect all age groups. It is possible to use preventive measures to significantly decrease the likelihood of falls. There are many simple measures which can make your home safer and prevent falls. OUTDOORS  Repair cracks and edges of walkways and driveways.  Remove high doorway  thresholds.  Trim shrubbery on the main path into your home.  Have good outside lighting.  Clear walkways of tools, rocks, debris, and clutter.  Check that handrails are not broken and are securely fastened. Both sides of steps should have handrails.  Have leaves, snow, and ice cleared regularly.  Use sand or salt on walkways during winter months.  In the garage, clean up grease or oil spills. BATHROOM  Install night lights.  Install grab bars by the toilet and in the tub and shower.  Use non-skid mats or decals in the tub or shower.  Place a plastic non-slip stool in the shower to sit on, if needed.  Keep floors dry and clean up all water on the floor immediately.  Remove soap buildup in the tub or shower on a regular basis.  Secure bath mats with non-slip, double-sided rug tape.  Remove throw rugs and tripping hazards from the floors. BEDROOMS  Install night lights.  Make sure a bedside light is easy to reach.  Do not use oversized bedding.  Keep a telephone by your bedside.  Have a firm chair with side arms to use for getting dressed.  Remove throw rugs and tripping hazards from the floor. KITCHEN  Keep handles on pots and pans turned toward the center of the stove. Use back burners when possible.  Clean up spills quickly and allow time for drying.  Avoid walking on wet  floors.  Avoid hot utensils and knives.  Position shelves so they are not too high or low.  Place commonly used objects within easy reach.  If necessary, use a sturdy step stool with a grab bar when reaching.  Keep electrical cables out of the way.  Do not use floor polish or wax that makes floors slippery. If you must use wax, use non-skid floor wax.  Remove throw rugs and tripping hazards from the floor. STAIRWAYS  Never leave objects on stairs.  Place handrails on both sides of stairways and use them. Fix any loose handrails. Make sure handrails on both sides of the stairways  are as long as the stairs.  Check carpeting to make sure it is firmly attached along stairs. Make repairs to worn or loose carpet promptly.  Avoid placing throw rugs at the top or bottom of stairways, or properly secure the rug with carpet tape to prevent slippage. Get rid of throw rugs, if possible.  Have an electrician put in a light switch at the top and bottom of the stairs. OTHER FALL PREVENTION TIPS  Wear low-heel or rubber-soled shoes that are supportive and fit well. Wear closed toe shoes.  When using a stepladder, make sure it is fully opened and both spreaders are firmly locked. Do not climb a closed stepladder.  Add color or contrast paint or tape to grab bars and handrails in your home. Place contrasting color strips on first and last steps.  Learn and use mobility aids as needed. Install an electrical emergency response system.  Turn on lights to avoid dark areas. Replace light bulbs that burn out immediately. Get light switches that glow.  Arrange furniture to create clear pathways. Keep furniture in the same place.  Firmly attach carpet with non-skid or double-sided tape.  Eliminate uneven floor surfaces.  Select a carpet pattern that does not visually hide the edge of steps.  Be aware of all pets. OTHER HOME SAFETY TIPS  Set the water temperature for 120 F (48.8 C).  Keep emergency numbers on or near the telephone.  Keep smoke detectors on every level of the home and near sleeping areas. Document Released: 12/01/2002 Document Revised: 06/11/2012 Document Reviewed: 03/01/2012 [facility] Patient Information 2014 [place].  Follow-Up Instructions: Return in about 3 weeks (around 01/26/2020).   Orders:  Orders Placed This Encounter  Procedures  . XR Cervical Spine 2 or 3 views  . MR Cervical Spine w/o contrast  . Ambulatory referral to Physical Medicine Rehab  . Ambulatory referral to Vascular Surgery   Meds ordered this encounter  Medications  .  acetaminophen-codeine (TYLENOL #3) 300-30 MG tablet    Sig: Take 1 tablet by mouth every 4 (four) hours as needed for up to 7 days for moderate pain.    Dispense:  30 tablet    Refill:  0      Procedures: No procedures performed   Clinical Data: No additional findings.   Subjective: Chief Complaint  Patient presents with  . Lower Back - Follow-up    injections helped for 1-2 weeks after,   . Left Arm - Pain    79 year old female, right handed with history of lumbar radiculopathy. She underwent lumbar ESI by Dr. Ernestina Patches 12/16/2019 She has hypertension and sees her cardiologist, apparently no change in her meds though she has hypertension this AM. She  Takes an afternoon med and has taken her AM meds. She reports the shot in the lower back helped her lower  extremity pain but she is having difficulty with her upper extremity on the left with increasing left arm pain and numbness and weakness where she Feels she could not raise the arm the week after her ESI. She had to force herself to use the left arm. It is better now, at night she has pain in the left arm and the whole left arm with tingling into all the fingers of the left arm. No bowel or bladder difficulty. She report the right leg posteriorly is starting to hurt again, it hurts whether she is standing or sitting down. The left arm pain is present at night and interferes with her sleep. She has pain that decreases her ability to get comfortable. The left arm has been giving her inclings of pain before but not as severe as this last couple of weeks. She has no neck pain but has pain in the back of the left  Shoulder and shoulder blade posteriorly in the back of the shoulder blade not on top. Turning over on the left side is painful then  Gravity or something helps to decrease the pain. There is tingling in the left arm now. But at night there is pain. She is not taking Anything but acetaminophen 500 mg 2 tablets 3 times per day  occasionally one a day but no more than 6 per day, she will only rarely take 8 per day. She reports that she called to request stronger medication.    Review of Systems  Constitutional: Negative for activity change, appetite change, chills, diaphoresis, fatigue, fever and unexpected weight change.  HENT: Negative.  Negative for congestion, dental problem, drooling, ear discharge, ear pain, facial swelling, hearing loss, mouth sores, nosebleeds, postnasal drip, rhinorrhea, sinus pressure, sinus pain, sneezing, sore throat, tinnitus, trouble swallowing and voice change.   Eyes: Negative.  Negative for photophobia, pain, discharge, redness, itching and visual disturbance.  Respiratory: Positive for apnea (Was to have testing but has not completed due to COVID they have decreased their testing), choking and wheezing. Negative for cough, chest tightness, shortness of breath and stridor.   Cardiovascular: Positive for leg swelling. Negative for chest pain and palpitations.  Gastrointestinal: Negative.  Negative for abdominal distention, abdominal pain, anal bleeding, blood in stool, constipation, diarrhea, nausea, rectal pain and vomiting.  Endocrine: Negative.  Negative for cold intolerance, heat intolerance, polydipsia, polyphagia and polyuria.  Genitourinary: Negative.  Negative for decreased urine volume, difficulty urinating, dyspareunia, dysuria, enuresis, flank pain, frequency, genital sores, hematuria, menstrual problem, pelvic pain and urgency.  Musculoskeletal: Positive for back pain and gait problem. Negative for arthralgias, joint swelling, myalgias, neck pain and neck stiffness.  Skin: Negative.   Allergic/Immunologic: Negative for environmental allergies, food allergies and immunocompromised state.  Neurological: Positive for weakness and numbness. Negative for dizziness, tremors, seizures, syncope, facial asymmetry, speech difficulty, light-headedness and headaches.  Hematological: Negative  for adenopathy. Does not bruise/bleed easily.  Psychiatric/Behavioral: Positive for sleep disturbance. Negative for agitation, behavioral problems, confusion, decreased concentration, dysphoric mood, hallucinations and self-injury. The patient is not nervous/anxious and is not hyperactive.      Objective: Vital Signs: BP (!) 173/73 (BP Location: Left Arm, Patient Position: Sitting)   Ht 5' 1.5" (1.562 m)   Wt 258 lb (117 kg)   BMI 47.96 kg/m   Physical Exam Constitutional:      Appearance: She is well-developed.  HENT:     Head: Normocephalic and atraumatic.  Eyes:     Pupils: Pupils are equal, round, and reactive  to light.  Pulmonary:     Effort: Pulmonary effort is normal.     Breath sounds: Normal breath sounds.  Abdominal:     General: Bowel sounds are normal.     Palpations: Abdomen is soft.  Musculoskeletal:     Cervical back: Normal range of motion and neck supple.     Lumbar back: Negative right straight leg raise test and negative left straight leg raise test.  Skin:    General: Skin is warm and dry.  Neurological:     Mental Status: She is alert and oriented to person, place, and time.  Psychiatric:        Behavior: Behavior normal.        Thought Content: Thought content normal.        Judgment: Judgment normal.     Back Exam   Tenderness  The patient is experiencing tenderness in the cervical and lumbar.  Range of Motion  Extension:  60 abnormal  Flexion: 90  Lateral bend right: 80  Lateral bend left: 60  Rotation right: 60  Rotation left: 60   Tests  Straight leg raise right: negative Straight leg raise left: negative  Reflexes  Patellar: 2/4 Achilles: 2/4 Babinski's sign: normal   Other  Toe walk: normal Heel walk: normal Sensation: normal Gait: normal  Erythema: no back redness Scars: absent   Right Elbow Exam   Tests  Tinel's sign (cubital tunnel): negative   Left Elbow Exam   Tenderness  The patient is experiencing  tenderness in the medial epicondyle.   Tests  Tinel's sign (cubital tunnel): positive      Specialty Comments:  No specialty comments available.  Imaging: No results found.   PMFS History: Patient Active Problem List   Diagnosis Date Noted  . Spinal stenosis of lumbar region with neurogenic claudication 07/21/2019  . Post laminectomy syndrome 07/21/2019  . Type 2 diabetes mellitus with retinopathy of both eyes, with long-term current use of insulin ([place]) 07/02/2019  . Type 2 diabetes mellitus with diabetic polyneuropathy, with long-term current use of insulin ([place]) 07/02/2019  . Type 2 diabetes mellitus with hyperglycemia, with long-term current use of insulin ([place]) 07/02/2019  . Cellulitis of right leg 07/02/2019  . PAF (paroxysmal atrial fibrillation) ([place]) 11/12/2018  . Hyperlipidemia associated with type 2 diabetes mellitus ([place]) 11/12/2018  . Hypertension associated with diabetes ([place]) 02/28/2018  . Mild episode of recurrent major depressive disorder ([place]) 08/21/2017  . Morbid obesity ([place]) 01/15/2017  . Status post placement of implantable loop recorder 08/17/2016  . Syncope 02/02/2016  . Bradycardia 02/02/2016  . SOB (shortness of breath) 02/02/2016  . Pre-syncope 02/02/2016  . History of colonic polyps 01/11/2016  . Type 2 diabetes mellitus with circulatory disorder, with long-term current use of insulin ([place]) 01/11/2016  . Chronic back pain - managed by Dr[facility] Mar[facility]ke at [facility] 03/12/2014  . Gout 06/30/2013  . Hypothyroidism 06/30/2013  . Iron deficiency anemia 06/30/2013  . Venous stasis of lower extremity 01/30/2013  . Refusal of blood transfusion for reasons of conscience 01/30/2013  . Lumbar disc disease with radiculopathy 01/30/2013   Past Medical History:  Diagnosis Date  . Anemia   . Arthritis   . Cancer ([place]) 1969   , Status post complete hysterectomy per patient  . Cataract    removed both eyes with lens implant both   . Chicken pox   . Chronic  bronchitis ([place])   . Colon polyp   . Depression 06/30/2013  . Diabetes  mellitus without complication ([place])   . Diverticulitis   . Frequent headaches   . GERD (gastroesophageal reflux disease)   . Gout 06/30/2013  . Hx of dizziness   . Hyperlipidemia   . Hypertension   . Post-operative nausea and vomiting   . S/P epidural steroid injection 09/27/2016   done in back at [place]   . Spinal stenosis of lumbar region    s/p spine surgery 02/12/13 with Dr Marcos [facility]ke at [facility]  . Thyroid disease   . Urine incontinence   . Use of cane as ambulatory aid     Family History  Problem Relation Age of Onset  . Alcohol abuse Father   . Cirrhosis Father   . Heart attack Brother   . Breast cancer Sister        Reports all family members with breast cancer have had genetic testing that was negative  . Cancer Mother        renal cancer  . Stroke Paternal Grandmother   . Alcohol abuse Brother   . Breast cancer Sister        renal cancer  . Diabetes Brother   . Heart Problems Daughter   . Healthy Daughter   . Healthy Daughter   . Healthy Son   . Healthy Son   . Arthritis Other        parents  . Hyperlipidemia Other        parent  . Hypertension Other        parent/grandparent  . Diabetes Other        parent/grandparent  . Heart disease Other   . Mental illness Other   . Colon cancer Neg Hx   . Esophageal cancer Neg Hx   . Prostate cancer Neg Hx   . Rectal cancer Neg Hx   . Seizures Neg Hx   . Colon polyps Neg Hx     Past Surgical History:  Procedure Laterality Date  . ABDOMINAL HYSTERECTOMY  1970   complete hysterectomy for cervical cancer  . BACK SURGERY  01/2013  . BACK SURGERY  1995  . CATARACT EXTRACTION, BILATERAL     with lens implant   . CERVICAL CONE BIOPSY  1970  . COLONOSCOPY    . EP IMPLANTABLE DEVICE N/A 02/15/2016   Procedure: Loop Recorder Insertion;  Surgeon: Sanda Klein, MD;  Location: [facility];  Service: Cardiovascular;  Laterality: N/A;  . POLYPECTOMY     . WRIST SURGERY  2010   for wrist fracture   Social History   Occupational History  . Occupation: Radiation protection practitioner    Comment: retired  Tobacco Use  . Smoking status: Former Smoker    Quit date: 12/25/1964    Years since quitting: 55.0  . Smokeless tobacco: Never Used  . Tobacco comment: smoked 6 years / when she was in college   Substance and Sexual Activity  . Alcohol use: Yes    Comment: occ with steak dinner   . Drug use: No  . Sexual activity: Not on file

## 2020-01-05 NOTE — Patient Instructions (Addendum)
Avoid overhead lifting and overhead use of the arms. Do not lift greater than 5 lbs. Adjust head rest in vehicle to prevent hyperextension if rear ended. Take extra precautions to avoid falling, including use of a cane if you feel weak. Avoid bending, stooping and avoid lifting weights greater than 10 lbs. Avoid prolong standing and walking. Avoid frequent bending and stooping  No lifting greater than 10 lbs. May use ice or moist heat for pain. Weight loss is of benefit. Handicap license is approved. 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 Referral to Dr. Ernestina Patches for EMG/NCV left arm assess for cubital tunnel and CTS vs cervical radiculopathy. Referral for MRI of the cervicsl spine due to cervical radiculopathy.  Tylenol #3 for pain one every 6 hours prn pain. Fall Prevention and Home Safety Falls cause injuries and can affect all age groups. It is possible to use preventive measures to significantly decrease the likelihood of falls. There are many simple measures which can make your home safer and prevent falls. OUTDOORS  Repair cracks and edges of walkways and driveways.  Remove high doorway thresholds.  Trim shrubbery on the main path into your home.  Have good outside lighting.  Clear walkways of tools, rocks, debris, and clutter.  Check that handrails are not broken and are securely fastened. Both sides of steps should have handrails.  Have leaves, snow, and ice cleared regularly.  Use sand or salt on walkways during winter months.  In the garage, clean up grease or oil spills. BATHROOM  Install night lights.  Install grab bars by the toilet and in the tub and shower.  Use non-skid mats or decals in the tub or shower.  Place a plastic non-slip stool in the shower to sit on, if needed.  Keep floors dry and clean up all water on the floor immediately.  Remove soap buildup in the tub or shower on a regular basis.  Secure bath mats with  non-slip, double-sided rug tape.  Remove throw rugs and tripping hazards from the floors. BEDROOMS  Install night lights.  Make sure a bedside light is easy to reach.  Do not use oversized bedding.  Keep a telephone by your bedside.  Have a firm chair with side arms to use for getting dressed.  Remove throw rugs and tripping hazards from the floor. KITCHEN  Keep handles on pots and pans turned toward the center of the stove. Use back burners when possible.  Clean up spills quickly and allow time for drying.  Avoid walking on wet floors.  Avoid hot utensils and knives.  Position shelves so they are not too high or low.  Place commonly used objects within easy reach.  If necessary, use a sturdy step stool with a grab bar when reaching.  Keep electrical cables out of the way.  Do not use floor polish or wax that makes floors slippery. If you must use wax, use non-skid floor wax.  Remove throw rugs and tripping hazards from the floor. STAIRWAYS  Never leave objects on stairs.  Place handrails on both sides of stairways and use them. Fix any loose handrails. Make sure handrails on both sides of the stairways are as long as the stairs.  Check carpeting to make sure it is firmly attached along stairs. Make repairs to worn or loose carpet promptly.  Avoid placing throw rugs at the top or bottom of stairways, or properly secure the rug with carpet tape to prevent slippage. Get rid of  throw rugs, if possible.  Have an electrician put in a light switch at the top and bottom of the stairs. OTHER FALL PREVENTION TIPS  Wear low-heel or rubber-soled shoes that are supportive and fit well. Wear closed toe shoes.  When using a stepladder, make sure it is fully opened and both spreaders are firmly locked. Do not climb a closed stepladder.  Add color or contrast paint or tape to grab bars and handrails in your home. Place contrasting color strips on first and last steps.  Learn  and use mobility aids as needed. Install an electrical emergency response system.  Turn on lights to avoid dark areas. Replace light bulbs that burn out immediately. Get light switches that glow.  Arrange furniture to create clear pathways. Keep furniture in the same place.  Firmly attach carpet with non-skid or double-sided tape.  Eliminate uneven floor surfaces.  Select a carpet pattern that does not visually hide the edge of steps.  Be aware of all pets. OTHER HOME SAFETY TIPS  Set the water temperature for 120 F (48.8 C).  Keep emergency numbers on or near the telephone.  Keep smoke detectors on every level of the home and near sleeping areas. Document Released: 12/01/2002 Document Revised: 06/11/2012 Document Reviewed: 03/01/2012 [facility] Patient Information 2014 [place].

## 2020-01-06 ENCOUNTER — Telehealth: Payer: Self-pay | Admitting: Internal Medicine

## 2020-01-06 ENCOUNTER — Telehealth: Payer: Self-pay | Admitting: Specialist

## 2020-01-06 NOTE — Telephone Encounter (Signed)
Patient is calling stating her back Doctor wants to perform an MRI and advised her to make sure it is okay due to her having a loop recorder. Please advise.

## 2020-01-06 NOTE — Telephone Encounter (Signed)
LMOVM for pt to call my direct office number and that she can have a MRI.

## 2020-01-07 ENCOUNTER — Telehealth (INDEPENDENT_AMBULATORY_CARE_PROVIDER_SITE_OTHER): Payer: Medicare PPO | Admitting: Family Medicine

## 2020-01-07 DIAGNOSIS — Z794 Long term (current) use of insulin: Secondary | ICD-10-CM

## 2020-01-07 DIAGNOSIS — L03115 Cellulitis of right lower limb: Secondary | ICD-10-CM

## 2020-01-07 DIAGNOSIS — E1142 Type 2 diabetes mellitus with diabetic polyneuropathy: Secondary | ICD-10-CM

## 2020-01-07 DIAGNOSIS — I878 Other specified disorders of veins: Secondary | ICD-10-CM

## 2020-01-07 MED ORDER — AMOXICILLIN 500 MG PO TABS: 500.0000 mg | ORAL_TABLET | Freq: Two times a day (BID) | ORAL | 0 refills | Status: AC

## 2020-01-07 NOTE — Progress Notes (Signed)
Virtual Visit via Video Note  I connected with Katie Mcintyre on 01/07/20 at  2:30 PM EST by a video enabled telemedicine application 2/2 [id number] pandemic and verified that I am speaking with the correct person using two identifiers.  Location patient: home Location provider:work or home office Persons participating in the virtual visit: patient, provider  I discussed the limitations of evaluation and management by telemedicine and the availability of in person appointments. The patient expressed understanding and agreed to proceed.   HPI: Pt is a 79 yo female with pmh sig for GERD, HTN, HLD, chronic back pain-managed at [facility] s/p laminectomy, arthritis, h/o cancer, spinal stenosis, gout, hypothyroidism, Afib, DM II with neuropathy and retinopathy, b/l LE venous stasis seen for acute concern.  Pt with back and leg problems.  Had a shot in her back 3 wks ago with Ortho, feels like it helped.  Pt notes a sore on her R leg, tender to touch, with mild erythema, and has shooting pain in it.  Pt has been unable to wear compression stockings as they are difficult to get on.  Pt states a referral for Vascular was placed by Ortho this wk.  She has not heard anything about the appt.  Pt denies fever, chills, n/v.  Pt was given an rx for Tylenol 3 for pain in neck and shoulders, but has not taken it yet as she takes regular tylenol in the am.    Has an appt with Dr. Ernestina Patches for NCS.    Pt notes her humalog went up to $100 for 5 pens.  BS was 140 this am.  The highest was 169 2/2 eating prunes at night.  Also eating apples, oranges, and bananas.  Pt notes she gained some weight.  She stopped eating bread and grits.  Gets up and moves around the house some.  ROS: See pertinent positives and negatives per HPI.  Past Medical History:  Diagnosis Date  . Anemia   . Arthritis   . Cancer ([place]) 1969   , Status post complete hysterectomy per patient  . Cataract    removed both eyes with lens implant both   .  Chicken pox   . Chronic bronchitis ([place])   . Colon polyp   . Depression 06/30/2013  . Diabetes mellitus without complication ([place])   . Diverticulitis   . Frequent headaches   . GERD (gastroesophageal reflux disease)   . Gout 06/30/2013  . Hx of dizziness   . Hyperlipidemia   . Hypertension   . Post-operative nausea and vomiting   . S/P epidural steroid injection 09/27/2016   done in back at [place]   . Spinal stenosis of lumbar region    s/p spine surgery 02/12/13 with Dr Marcos Eke at [facility]  . Thyroid disease   . Urine incontinence   . Use of cane as ambulatory aid     Past Surgical History:  Procedure Laterality Date  . ABDOMINAL HYSTERECTOMY  1970   complete hysterectomy for cervical cancer  . BACK SURGERY  01/2013  . BACK SURGERY  1995  . CATARACT EXTRACTION, BILATERAL     with lens implant   . CERVICAL CONE BIOPSY  1970  . COLONOSCOPY    . EP IMPLANTABLE DEVICE N/A 02/15/2016   Procedure: Loop Recorder Insertion;  Surgeon: Sanda Klein, MD;  Location: [place] [facility];  Service: Cardiovascular;  Laterality: N/A;  . POLYPECTOMY    . WRIST SURGERY  2010   for wrist fracture  Family History  Problem Relation Age of Onset  . Alcohol abuse Father   . Cirrhosis Father   . Heart attack Brother   . Breast cancer Sister        Reports all family members with breast cancer have had genetic testing that was negative  . Cancer Mother        renal cancer  . Stroke Paternal Grandmother   . Alcohol abuse Brother   . Breast cancer Sister        renal cancer  . Diabetes Brother   . Heart Problems Daughter   . Healthy Daughter   . Healthy Daughter   . Healthy Son   . Healthy Son   . Arthritis Other        parents  . Hyperlipidemia Other        parent  . Hypertension Other        parent/grandparent  . Diabetes Other        parent/grandparent  . Heart disease Other   . Mental illness Other   . Colon cancer Neg Hx   . Esophageal cancer Neg Hx   . Prostate cancer Neg Hx    . Rectal cancer Neg Hx   . Seizures Neg Hx   . Colon polyps Neg Hx      Current Outpatient Medications:  .  ACCU-CHEK AVIVA PLUS test strip, USE AS INSTRUCTED TO CHECK BLOOD SUGAR TWICE A DAY * E11.9*, Disp: 100 each, Rfl: 5 .  ACCU-CHEK FASTCLIX LANCETS MISC, USE AS DIRECTED, Disp: 102 each, Rfl: 5 .  acetaminophen (TYLENOL) 500 MG tablet, Take 1,000 mg by mouth every 8 (eight) hours as needed., Disp: , Rfl:  .  acetaminophen-codeine (TYLENOL #3) 300-30 MG tablet, Take 1 tablet by mouth every 4 (four) hours as needed for up to 7 days for moderate pain., Disp: 30 tablet, Rfl: 0 .  amLODipine (NORVASC) 5 MG tablet, Take 1 tablet by mouth once daily, Disp: 85 tablet, Rfl: 0 .  atorvastatin (LIPITOR) 40 MG tablet, TAKE 1 TABLET BY MOUTH EVERY DAY, Disp: 90 tablet, Rfl: 3 .  B-D UF III MINI PEN NEEDLES 31G X 5 MM MISC, USE TO INJECT INSULIN TWICE A DAY, Disp: 100 each, Rfl: 3 .  Blood Glucose Calibration (ACCU-CHEK AVIVA) SOLN, Use as directed., Disp: 1 each, Rfl: 1 .  calcium-vitamin D (OSCAL 500/200 D-3) 500-200 MG-UNIT per tablet, Take 1 tablet by mouth daily., Disp: , Rfl:  .  carvedilol (COREG) 6.25 MG tablet, Take 1 tablet (6.25 mg total) by mouth 2 (two) times daily., Disp: 180 tablet, Rfl: 3 .  cloNIDine (CATAPRES) 0.1 MG tablet, TAKE 1 TABLET BY MOUTH TWICE A DAY, Disp: 180 tablet, Rfl: 2 .  ferrous sulfate 325 (65 FE) MG tablet, Take 325 mg by mouth daily with breakfast., Disp: , Rfl:  .  gabapentin (NEURONTIN) 100 MG capsule, Take 1 capsule (100 mg total) by mouth at bedtime., Disp: 90 capsule, Rfl: 3 .  hydrALAZINE (APRESOLINE) 50 MG tablet, TAKE 1 TABLET BY MOUTH THREE TIMES A DAY, Disp: 270 tablet, Rfl: 3 .  Insulin Lispro Prot & Lispro (HUMALOG MIX 75/25 KWIKPEN) (75-25) 100 UNIT/ML Kwikpen, Inject 12 Units into the skin 2 (two) times daily with a meal., Disp: 15 mL, Rfl: 0 .  irbesartan (AVAPRO) 75 MG tablet, Take 2 tablets (150 mg total) by mouth daily., Disp: 180 tablet, Rfl:  0 .  isosorbide dinitrate (ISORDIL) 20 MG tablet, Take one tablet by mouth two  times a day., Disp: 180 tablet, Rfl: 2 .  Lancet Devices (ACCU-CHEK SOFTCLIX) lancets, Use as instructed twice a day, Disp: 1 each, Rfl: 5 .  levothyroxine (SYNTHROID) 125 MCG tablet, TAKE 1 TABLET BY MOUTH EVERY DAY, Disp: 90 tablet, Rfl: 1 .  metFORMIN (GLUCOPHAGE) 1000 MG tablet, TAKE 1 TABLET BY MOUTH TWICE A DAY WITH MEALS, Disp: 180 tablet, Rfl: 0 .  Sennosides-Docusate Sodium (STOOL SOFTENER & LAXATIVE PO), Take 1 tablet by mouth daily as needed (constipation). , Disp: , Rfl:  .  vitamin B-12 (CYANOCOBALAMIN) 1000 MCG tablet, Take by mouth., Disp: , Rfl:   Current Facility-Administered Medications:  .  0.9 %  sodium chloride infusion, 500 mL, Intravenous, Once, Pyrtle, Lajuan Lines, MD .  ipratropium-albuterol (DUONEB) 0.5-2.5 (3) MG/3ML nebulizer solution 3 mL, 3 mL, Nebulization, Once, Kordsmeier, Gregary Signs, FNP  EXAM:  VITALS per patient if applicable:  fsbs [id number].  RR between 12-20 bpm  GENERAL: alert, oriented, appears well and in no acute distress  HEENT: atraumatic, conjunctiva clear, no obvious abnormalities on inspection of external nose and ears  NECK: normal movements of the head and neck  LUNGS: on inspection no signs of respiratory distress, breathing rate appears normal, no obvious gross SOB, gasping or wheezing  CV: no obvious cyanosis  SKIN: RLE with an ulceration surrounded by erythema and edema.  No drainage noted.  LLE without lesions, erythema, or edema.     MS: moves all visible extremities without noticeable abnormality  PSYCH/NEURO: pleasant and cooperative, no obvious depression or anxiety, speech and thought processing grossly intact  ASSESSMENT AND PLAN:  Discussed the following assessment and plan:  Cellulitis of right leg  -discussed keeping area clean and dry -elevated LEs when able - Plan: amoxicillin (AMOXIL) 500 MG tablet -given precautions  Venous stasis of lower  extremity -discussed elevating LEs. -consider HH for wound care.  Pt hesitant as unsure if her insurance will cover it 2/2 recent changes in the plan. -referral to Vascular placed 01/04/19.    Type 2 diabetes mellitus with diabetic polyneuropathy, with long-term current use of insulin (HCC) -fsbs mildly elevated -pt advised to decrease portions and carbohydrate intake. -discussed moving around more -continue current meds: Humalog 75/25 12 units BID with meals, Metformin 1000 mg BID, gabapentin 100 mg qhs  F/u in 2-4 wks, sooner if needed.   I discussed the assessment and treatment plan with the patient. The patient was provided an opportunity to ask questions and all were answered. The patient agreed with the plan and demonstrated an understanding of the instructions.   The patient was advised to call back or seek an in-person evaluation if the symptoms worsen or if the condition fails to improve as anticipated.  I provided 29 minutes of non-face-to-face time during this encounter.   Billie Ruddy, MD

## 2020-01-13 ENCOUNTER — Ambulatory Visit (INDEPENDENT_AMBULATORY_CARE_PROVIDER_SITE_OTHER): Payer: Medicare PPO | Admitting: Podiatry

## 2020-01-13 ENCOUNTER — Encounter: Payer: Self-pay | Admitting: Podiatry

## 2020-01-13 ENCOUNTER — Other Ambulatory Visit: Payer: Self-pay

## 2020-01-13 DIAGNOSIS — E1142 Type 2 diabetes mellitus with diabetic polyneuropathy: Secondary | ICD-10-CM | POA: Diagnosis not present

## 2020-01-13 DIAGNOSIS — M79674 Pain in right toe(s): Secondary | ICD-10-CM

## 2020-01-13 DIAGNOSIS — B351 Tinea unguium: Secondary | ICD-10-CM | POA: Diagnosis not present

## 2020-01-13 DIAGNOSIS — M79675 Pain in left toe(s): Secondary | ICD-10-CM | POA: Diagnosis not present

## 2020-01-13 NOTE — Patient Instructions (Signed)

## 2020-01-14 ENCOUNTER — Ambulatory Visit: Payer: Medicare Other | Admitting: Specialist

## 2020-01-16 ENCOUNTER — Encounter: Payer: Self-pay | Admitting: Physical Medicine and Rehabilitation

## 2020-01-16 ENCOUNTER — Other Ambulatory Visit: Payer: Self-pay | Admitting: *Deleted

## 2020-01-16 ENCOUNTER — Ambulatory Visit (INDEPENDENT_AMBULATORY_CARE_PROVIDER_SITE_OTHER): Payer: Medicare PPO | Admitting: Physical Medicine and Rehabilitation

## 2020-01-16 ENCOUNTER — Other Ambulatory Visit: Payer: Self-pay

## 2020-01-16 DIAGNOSIS — R202 Paresthesia of skin: Secondary | ICD-10-CM | POA: Diagnosis not present

## 2020-01-16 MED ORDER — ACETAMINOPHEN-CODEINE #3 300-30 MG PO TABS: 1.0000 | ORAL_TABLET | ORAL | 0 refills | Status: AC | PRN

## 2020-01-16 NOTE — Telephone Encounter (Signed)
Refill request has been sent to Dr. Louanne Skye

## 2020-01-16 NOTE — Progress Notes (Signed)
  Numeric Pain Rating Scale and Functional Assessment Average Pain 8   In the last MONTH (on 0-10 scale) has pain interfered with the following?  1. General activity like being  able to carry out your everyday physical activities such as walking, climbing stairs, carrying groceries, or moving a chair?  Rating(9)   .  

## 2020-01-18 NOTE — Progress Notes (Signed)
Subjective: Katie Mcintyre presents today for follow up of preventative diabetic foot care: and painful mycotic nails b/l that are difficult to trim. Pain interferes with ambulation. Aggravating factors include wearing enclosed shoe gear. Pain is relieved with periodic professional debridement.   Allergies  Allergen Reactions  . Advil [Ibuprofen]     Was taking this for pain and had some ? Swollen lips  . Lyrica [Pregabalin]     Extreme swelling legs     Objective: There were no vitals filed for this visit.  Vascular Examination:  capillary refill time to digits immediate b/l, palpable DP pulses b/l, palpable PT pulses b/l, pedal hair absent b/l and skin temperature gradient within normal limits b/l  Dermatological Examination: Pedal skin is thin shiny, atrophic bilaterally, no interdigital macerations bilaterally and toenails 1-5 b/l elongated, dystrophic, thickened, crumbly with subungual debris  Musculoskeletal: normal muscle strength 5/5 to all lower extremity muscle groups bilaterally and no pain crepitus or joint limitation noted with ROM b/l  Neurological: sensation intact 5/5 intact bilaterally with 10g monofilament and vibratory sensation absent  Assessment: 1. Pain due to onychomycosis of toenails of both feet   2. Diabetic peripheral neuropathy associated with type 2 diabetes mellitus (Katie Mcintyre)     Plan: Continue diabetic foot care principles. Literature dispensed on today.  -Toenails 1-5 b/l were debrided in length and girth without iatrogenic bleeding. -Patient to continue soft, supportive shoe gear daily. -Patient to report any pedal injuries to medical professional immediately. -Patient/POA to call should there be question/concern in the interim.  Return in about 3 months (around 04/12/2020).

## 2020-01-19 ENCOUNTER — Encounter: Payer: Self-pay | Admitting: Family Medicine

## 2020-01-19 DIAGNOSIS — E113313 Type 2 diabetes mellitus with moderate nonproliferative diabetic retinopathy with macular edema, bilateral: Secondary | ICD-10-CM | POA: Diagnosis not present

## 2020-01-19 DIAGNOSIS — H43822 Vitreomacular adhesion, left eye: Secondary | ICD-10-CM | POA: Diagnosis not present

## 2020-01-19 DIAGNOSIS — H35373 Puckering of macula, bilateral: Secondary | ICD-10-CM | POA: Diagnosis not present

## 2020-01-19 LAB — HM DIABETES EYE EXAM

## 2020-01-19 NOTE — Progress Notes (Signed)
Katie Mcintyre - 79 y.o. female MRN [id number]  Date of birth: March 14, 1941  Office Visit Note: Visit Date: 01/16/2020 PCP: Billie Ruddy, MD Referred by: Billie Ruddy, MD  Subjective: Chief Complaint  Patient presents with  . Left Shoulder - Pain  . Left Arm - Pain, Tingling  . Left Hand - Tingling, Pain   HPI: Katie Mcintyre is a 79 y.o. female who comes in today At the request of Dr. Basil Dess for electrodiagnostic study of the left upper limb.  Patient is right-hand dominant and complains of worsening pain numbness and tingling since December 2020 in the back of the left shoulder as well as pain in the left arm and tingling in the left hand and the index middle and ring finger.  She reports that trying to move her left arm and left hand makes it worse.  She reports laying down and massaging it seems to help.  She rates her pain as an 8 out of 10.  She is a diabetic, type II with insulin dependency.  ROS Otherwise per HPI.  Assessment & Plan: Visit Diagnoses:  1. 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  Unfortunately the slowing is borderline and could be related to temperature artifact.  There is no significant electrodiagnostic evidence of any other focal nerve entrapment, brachial plexopathy or cervical radiculopathy.  ** As you know, this particular electrodiagnostic study cannot rule out chemical radiculitis or sensory only radiculopathy.  Recommendations: 1.  Follow-up with referring physician. 2.  Continue current management of symptoms.  Meds & Orders: No orders of the defined types were placed in this encounter.   Orders Placed This Encounter  Procedures  . NCV with EMG (electromyography)    Follow-up: Return for Basil Dess, M.D..   Procedures: No procedures performed  EMG  & NCV Findings: Evaluation of the left median (across palm) sensory nerve showed no response (Palm) and prolonged distal peak latency (3.8 ms).  All remaining nerves (as indicated in the following tables) were within normal limits.    All examined muscles (as indicated in the following table) showed no evidence of electrical instability.    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  Unfortunately the slowing is borderline and could be related to temperature artifact.  There is no significant electrodiagnostic evidence of any other focal nerve entrapment, brachial plexopathy or cervical radiculopathy.  ** As you know, this particular electrodiagnostic study cannot rule out chemical radiculitis or sensory only radiculopathy.  Recommendations: 1.  Follow-up with referring physician. 2.  Continue current management of symptoms.  ___________________________ Laurence Spates FAAPMR Board Certified, American Board of Physical Medicine and Rehabilitation    Nerve Conduction Studies Anti Sensory Summary Table   Stim Site NR Peak (ms) Norm Peak (ms) P-T Amp (V) Norm P-T Amp Site1 Site2 Delta-P (ms) Dist (cm) Vel (m/s) Norm Vel (m/s)  Left Median Acr Palm Anti Sensory (2nd Digit)  31.9C  Wrist    *3.8 <3.6 15.1 >10 Wrist Palm  0.0    Palm *NR  <2.0          Left Radial Anti Sensory (Base 1st Digit)  32.1C  Wrist    2.1 <3.1 19.4  Wrist Base 1st Digit 2.1 0.0    Left Ulnar Anti Sensory (5th Digit)  32.3C  Wrist    3.3 <3.7 15.0 >15.0 Wrist 5th Digit 3.3 14.0 42 >38   Motor Summary Table   Stim Site NR Onset (ms) Norm Onset (ms) O-P Amp (mV) Norm O-P Amp Site1 Site2 Delta-0 (ms) Dist (cm) Vel (m/s) Norm Vel (m/s)  Left Median Motor (Abd Poll Brev)  32.3C  Wrist    3.5 <4.2 7.6 >5 Elbow Wrist 3.1 17.0 55 >50  Elbow    6.6  7.5         Left  Ulnar Motor (Abd Dig Min)  32.5C  Wrist    2.7 <4.2 8.4 >3 B Elbow Wrist 2.5 16.0 64 >53  B Elbow    5.2  5.1  A Elbow B Elbow 1.8 10.0 56 >53  A Elbow    7.0  7.8          EMG   Side Muscle Nerve Root Ins Act Fibs Psw Amp Dur Poly Recrt Int Fraser Din Comment  Left Abd Poll Brev Median C8-T1 Nml Nml Nml Nml Nml 0 Nml Nml   Left 1stDorInt Ulnar C8-T1 Nml Nml Nml Nml Nml 0 Nml Nml   Left PronatorTeres Median C6-7 Nml Nml Nml Nml Nml 0 Nml Nml   Left Biceps Musculocut C5-6 Nml Nml Nml Nml Nml 0 Nml Nml   Left Deltoid Axillary C5-6 Nml Nml Nml Nml Nml 0 Nml Nml     Nerve Conduction Studies Anti Sensory Left/Right Comparison   Stim Site L Lat (ms) R Lat (ms) L-R Lat (ms) L Amp (V) R Amp (V) L-R Amp (%) Site1 Site2 L Vel (m/s) R Vel (m/s) L-R Vel (m/s)  Median Acr Palm Anti Sensory (2nd Digit)  31.9C  Wrist *3.8   15.1   Wrist Palm     Palm             Radial Anti Sensory (Base 1st Digit)  32.1C  Wrist 2.1   19.4   Wrist Base 1st Digit     Ulnar Anti Sensory (5th Digit)  32.3C  Wrist 3.3   15.0   Wrist 5th Digit 42     Motor Left/Right Comparison   Stim Site L Lat (ms) R Lat (ms) L-R Lat (ms) L Amp (mV) R Amp (mV) L-R Amp (%) Site1 Site2 L Vel (m/s) R Vel (m/s) L-R Vel (m/s)  Median Motor (Abd Poll Brev)  32.3C  Wrist 3.5   7.6   Elbow Wrist 55    Elbow 6.6   7.5         Ulnar Motor (Abd Dig Min)  32.5C  Wrist 2.7   8.4   B Elbow Wrist 64    B Elbow 5.2   5.1   A Elbow B Elbow 56    A Elbow 7.0   7.8            Waveforms:             Clinical History: FINDINGS:  There is lumbosacral transitional anatomy. For the purpose of this dictation, the last well-formed disc space is designated as S1-S2 and labeled on image as such.  Minimal anterolisthesis of L4 on L5. Normal lumbar spinal alignment otherwise. Status post L3-L5 laminectomy. Marrow is unremarkable. Conus terminates at approximately L1. The visualized spinal cord morphology and signal, and the cauda equina  appear normal. Negative for epidural hematoma. Small left renal cysts. Retroperitoneal soft  tissues are unremarkable. Sacroiliac joints are normal.  --T11-T12: Imaged on the sagittal plane only. Large posterior disc osteophyte complex impression on ventral cord. No cord signal abnormality. Moderate bilateral neuroforaminal narrowing. --T12-L1: Mild posterior disc bulge without canal or foraminal narrowing. Mild bilateral facet degenerative disease.  --L1-L2: There is no evidence of spinal stenosis, disc bulge or neural foraminal narrowing. Mild left facet degenerative disease. --L2-L3: Large posterior disc osteophyte complex and severe bilateral facet hypertrophy, resulting in moderate spinal canal narrowing and severe bilateral neuroforamina narrowing.  --L3-L4: Broad-based disc bulge and severe bilateral facet hypertrophy, resulting in the severe spinal canal and bilateral neural foraminal narrowing. --L4-L5: Status post laminectomy. Broad based disc bulge and severe bilateral facet hypertrophy resulting in mild spinal canal narrowing and severe left and mild right neuroforaminal narrowing.  --L5-S1: Status post laminectomy. No disc bulge or spinal canal narrowing. Severe bilateral neuroforamina narrowing. Severe bilateral facet hypertrophy   IMPRESSION: 1. Spinal canal narrowing is moderate at L2-L3, severe at L3-L4, and mild at L4-L5, mostly from degenerative facet disease. 2. Multilevel neuroforaminal narrowing, which is severe at bilateral L2-L3, bilateral L3-L4,, left L4-L5, and bilateral L5-S1.   Electronically Reviewed IB:[id number] Roosevelt Locks, MD Electronically Reviewed on:09/09/2016 7:55 PM   She reports that she quit smoking about 55 years ago. She has never used smokeless tobacco.  Recent Labs    03/06/19 1441 07/02/19 1103  HGBA1C 8.5* 7.1*    Objective:  VS:  HT:    WT:   BMI:     BP:   HR: bpm  TEMP: ( )  RESP:  Physical Exam Musculoskeletal:         General: No swelling, tenderness or deformity.     Comments: Inspection reveals no atrophy of the bilateral APB or FDI or hand intrinsics. There is no swelling, color changes, allodynia or dystrophic changes. There is 5 out of 5 strength in the bilateral wrist extension, finger abduction and long finger flexion. There is intact sensation to light touch in all dermatomal and peripheral nerve distributions.  There is a negative Hoffmann's test bilaterally.  Skin:    General: Skin is warm and dry.     Findings: No erythema or rash.  Neurological:     General: No focal deficit present.     Mental Status: She is alert and oriented to person, place, and time.     Motor: No weakness or abnormal muscle tone.     Coordination: Coordination normal.  Psychiatric:        Mood and Affect: Mood normal.        Behavior: Behavior normal.     Ortho Exam Imaging: No results found.  Past Medical/Family/Surgical/Social History: Medications & Allergies reviewed per EMR, new medications updated. Patient Active Problem List   Diagnosis Date Noted  . Spinal stenosis of lumbar region with neurogenic claudication 07/21/2019  . Post laminectomy syndrome 07/21/2019  . Type 2 diabetes mellitus with retinopathy of both eyes, with long-term current use of insulin ([place]) 07/02/2019  . Type 2 diabetes mellitus with diabetic polyneuropathy, with long-term current use of insulin ([place]) 07/02/2019  . Type 2 diabetes mellitus with hyperglycemia, with long-term current use of insulin ([place]) 07/02/2019  . Cellulitis of right leg 07/02/2019  . PAF (paroxysmal atrial fibrillation) ([place]) 11/12/2018  . Hyperlipidemia associated with type 2 diabetes mellitus ([place]) 11/12/2018  . Hypertension associated with diabetes ([place]) 02/28/2018  . Mild episode of recurrent major depressive disorder ([place]) 08/21/2017  . Morbid obesity ([place]) 01/15/2017  .  Status post placement of implantable loop recorder 08/17/2016  . Syncope 02/02/2016  .  Bradycardia 02/02/2016  . SOB (shortness of breath) 02/02/2016  . Pre-syncope 02/02/2016  . History of colonic polyps 01/11/2016  . Type 2 diabetes mellitus with circulatory disorder, with long-term current use of insulin ([place]) 01/11/2016  . Chronic back pain - managed by Dr[facility] Marcos [facility]ke at [facility] 03/12/2014  . Gout 06/30/2013  . Hypothyroidism 06/30/2013  . Iron deficiency anemia 06/30/2013  . Venous stasis of lower extremity 01/30/2013  . Refusal of blood transfusion for reasons of conscience 01/30/2013  . Lumbar disc disease with radiculopathy 01/30/2013   Past Medical History:  Diagnosis Date  . Anemia   . Arthritis   . Cancer ([place]) 1969   , Status post complete hysterectomy per patient  . Cataract    removed both eyes with lens implant both   . Chicken pox   . Chronic bronchitis ([place])   . Colon polyp   . Depression 06/30/2013  . Diabetes mellitus without complication ([place])   . Diverticulitis   . Frequent headaches   . GERD (gastroesophageal reflux disease)   . Gout 06/30/2013  . Hx of dizziness   . Hyperlipidemia   . Hypertension   . Post-operative nausea and vomiting   . S/P epidural steroid injection 09/27/2016   done in back at [place]   . Spinal stenosis of lumbar region    s/p spine surgery 02/12/13 with Dr Marcos [facility]ke at [facility]  . Thyroid disease   . Urine incontinence   . Use of cane as ambulatory aid    Family History  Problem Relation Age of Onset  . Alcohol abuse Father   . Cirrhosis Father   . Heart attack Brother   . Breast cancer Sister        Reports all family members with breast cancer have had genetic testing that was negative  . Cancer Mother        renal cancer  . Stroke Paternal Grandmother   . Alcohol abuse Brother   . Breast cancer Sister        renal cancer  . Diabetes Brother   . Heart Problems Daughter   . Healthy Daughter   . Healthy Daughter   . Healthy Son   . Healthy Son   . Arthritis Other        parents  . Hyperlipidemia Other         parent  . Hypertension Other        parent/grandparent  . Diabetes Other        parent/grandparent  . Heart disease Other   . Mental illness Other   . Colon cancer Neg Hx   . Esophageal cancer Neg Hx   . Prostate cancer Neg Hx   . Rectal cancer Neg Hx   . Seizures Neg Hx   . Colon polyps Neg Hx    Past Surgical History:  Procedure Laterality Date  . ABDOMINAL HYSTERECTOMY  1970   complete hysterectomy for cervical cancer  . BACK SURGERY  01/2013  . BACK SURGERY  1995  . CATARACT EXTRACTION, BILATERAL     with lens implant   . CERVICAL CONE BIOPSY  1970  . COLONOSCOPY    . EP IMPLANTABLE DEVICE N/A 02/15/2016   Procedure: Loop Recorder Insertion;  Surgeon: Sanda Klein, MD;  Location: [place] [facility];  Service: Cardiovascular;  Laterality: N/A;  . POLYPECTOMY    . WRIST SURGERY  2010   for  wrist fracture   Social History   Occupational History  . Occupation: Radiation protection practitioner    Comment: retired  Tobacco Use  . Smoking status: Former Smoker    Quit date: 12/25/1964    Years since quitting: 55.1  . Smokeless tobacco: Never Used  . Tobacco comment: smoked 6 years / when she was in college   Substance and Sexual Activity  . Alcohol use: Yes    Comment: occ with steak dinner   . Drug use: No  . Sexual activity: Not on file

## 2020-01-19 NOTE — Procedures (Signed)
EMG & NCV Findings: Evaluation of the left median (across palm) sensory nerve showed no response (Palm) and prolonged distal peak latency (3.8 ms).  All remaining nerves (as indicated in the following tables) were within normal limits.    All examined muscles (as indicated in the following table) showed no evidence of electrical instability.    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  Unfortunately the slowing is borderline and could be related to temperature artifact.  There is no significant electrodiagnostic evidence of any other focal nerve entrapment, brachial plexopathy or cervical radiculopathy.  ** As you know, this particular electrodiagnostic study cannot rule out chemical radiculitis or sensory only radiculopathy.  Recommendations: 1.  Follow-up with referring physician. 2.  Continue current management of symptoms.  ___________________________ Laurence Spates FAAPMR Board Certified, American Board of Physical Medicine and Rehabilitation    Nerve Conduction Studies Anti Sensory Summary Table   Stim Site NR Peak (ms) Norm Peak (ms) P-T Amp (V) Norm P-T Amp Site1 Site2 Delta-P (ms) Dist (cm) Vel (m/s) Norm Vel (m/s)  Left Median Acr Palm Anti Sensory (2nd Digit)  31.9C  Wrist    *3.8 <3.6 15.1 >10 Wrist Palm  0.0    Palm *NR  <2.0          Left Radial Anti Sensory (Base 1st Digit)  32.1C  Wrist    2.1 <3.1 19.4  Wrist Base 1st Digit 2.1 0.0    Left Ulnar Anti Sensory (5th Digit)  32.3C  Wrist    3.3 <3.7 15.0 >15.0 Wrist 5th Digit 3.3 14.0 42 >38   Motor Summary Table   Stim Site NR Onset (ms) Norm Onset (ms) O-P Amp (mV) Norm O-P Amp Site1 Site2 Delta-0 (ms) Dist (cm) Vel (m/s) Norm Vel (m/s)  Left Median Motor (Abd Poll Brev)  32.3C  Wrist    3.5 <4.2 7.6 >5 Elbow Wrist 3.1 17.0 55 >50  Elbow    6.6  7.5         Left  Ulnar Motor (Abd Dig Min)  32.5C  Wrist    2.7 <4.2 8.4 >3 B Elbow Wrist 2.5 16.0 64 >53  B Elbow    5.2  5.1  A Elbow B Elbow 1.8 10.0 56 >53  A Elbow    7.0  7.8          EMG   Side Muscle Nerve Root Ins Act Fibs Psw Amp Dur Poly Recrt Int Fraser Din Comment  Left Abd Poll Brev Median C8-T1 Nml Nml Nml Nml Nml 0 Nml Nml   Left 1stDorInt Ulnar C8-T1 Nml Nml Nml Nml Nml 0 Nml Nml   Left PronatorTeres Median C6-7 Nml Nml Nml Nml Nml 0 Nml Nml   Left Biceps Musculocut C5-6 Nml Nml Nml Nml Nml 0 Nml Nml   Left Deltoid Axillary C5-6 Nml Nml Nml Nml Nml 0 Nml Nml     Nerve Conduction Studies Anti Sensory Left/Right Comparison   Stim Site L Lat (ms) R Lat (ms) L-R Lat (ms) L Amp (V) R Amp (V) L-R Amp (%) Site1 Site2 L Vel (m/s) R Vel (m/s) L-R Vel (m/s)  Median Acr Palm Anti Sensory (2nd Digit)  31.9C  Wrist *3.8   15.1   Wrist Palm     Palm             Radial Anti Sensory (Base 1st  Digit)  32.1C  Wrist 2.1   19.4   Wrist Base 1st Digit     Ulnar Anti Sensory (5th Digit)  32.3C  Wrist 3.3   15.0   Wrist 5th Digit 42     Motor Left/Right Comparison   Stim Site L Lat (ms) R Lat (ms) L-R Lat (ms) L Amp (mV) R Amp (mV) L-R Amp (%) Site1 Site2 L Vel (m/s) R Vel (m/s) L-R Vel (m/s)  Median Motor (Abd Poll Brev)  32.3C  Wrist 3.5   7.6   Elbow Wrist 55    Elbow 6.6   7.5         Ulnar Motor (Abd Dig Min)  32.5C  Wrist 2.7   8.4   B Elbow Wrist 64    B Elbow 5.2   5.1   A Elbow B Elbow 56    A Elbow 7.0   7.8            Waveforms:

## 2020-01-29 ENCOUNTER — Ambulatory Visit: Payer: Medicare PPO | Admitting: Specialist

## 2020-01-29 ENCOUNTER — Telehealth: Payer: Self-pay

## 2020-01-29 ENCOUNTER — Other Ambulatory Visit: Payer: Self-pay

## 2020-01-29 ENCOUNTER — Encounter: Payer: Self-pay | Admitting: Specialist

## 2020-01-29 VITALS — BP 189/76 | HR 63 | Ht 61.5 in | Wt 258.0 lb

## 2020-01-29 DIAGNOSIS — M48062 Spinal stenosis, lumbar region with neurogenic claudication: Secondary | ICD-10-CM

## 2020-01-29 DIAGNOSIS — G5622 Lesion of ulnar nerve, left upper limb: Secondary | ICD-10-CM | POA: Diagnosis not present

## 2020-01-29 DIAGNOSIS — M5010 Cervical disc disorder with radiculopathy, unspecified cervical region: Secondary | ICD-10-CM

## 2020-01-29 DIAGNOSIS — M4316 Spondylolisthesis, lumbar region: Secondary | ICD-10-CM | POA: Diagnosis not present

## 2020-01-29 MED ORDER — ALPRAZOLAM 0.5 MG PO TABS: ORAL_TABLET | ORAL | 0 refills | Status: AC

## 2020-01-29 NOTE — Patient Instructions (Signed)
Plan: Avoid overhead lifting and overhead use of the arms. Do not lift greater than 5 lbs. Adjust head rest in vehicle to prevent hyperextension if rear ended. Take extra precautions to avoid falling, including use of a cane if you feel weak. Avoid bending, stooping and avoid lifting weights greater than 10 lbs. Avoid prolong standing and walking. Avoid frequent bending and stooping  No lifting greater than 10 lbs. May use ice or moist heat for pain. Weight loss is of benefit. Handicap license is approved. 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 Referral to Dr. Ernestina Patches for EMG/NCV left arm assess for cubital tunnel and CTS vs cervical radiculopathy. Referral for MRI of the cervicsl spine due to cervical radiculopathy.  Tylenol #3 for pain one every 6 hours prn pain. Fall Prevention and Home Safety Falls cause injuries and can affect all age groups. It is possible to use preventive measures to significantly decrease the likelihood of falls. There are many simple measures which can make your home safer and prevent falls. OUTDOORS  Repair cracks and edges of walkways and driveways.  Remove high doorway thresholds.  Trim shrubbery on the main path into your home.  Have good outside lighting.  Clear walkways of tools, rocks, debris, and clutter.  Check that handrails are not broken and are securely fastened. Both sides of steps should have handrails.  Have leaves, snow, and ice cleared regularly.  Use sand or salt on walkways during winter months.  In the garage, clean up grease or oil spills. BATHROOM  Install night lights.  Install grab bars by the toilet and in the tub and shower.  Use non-skid mats or decals in the tub or shower.  Place a plastic non-slip stool in the shower to sit on, if needed.  Keep floors dry and clean up all water on the floor immediately.  Remove soap buildup in the tub or shower on a regular basis.  Secure bath  mats with non-slip, double-sided rug tape.  Remove throw rugs and tripping hazards from the floors. BEDROOMS  Install night lights.  Make sure a bedside light is easy to reach.  Do notuse oversized bedding.  Keep a telephone by your bedside.  Have a firm chair with side arms to use for getting dressed.  Remove throw rugs and tripping hazards from the floor. KITCHEN  Keep handles on pots and pans turned toward the center of the stove. Use back burners when possible.  Clean up spills quickly and allow time for drying.  Avoid walking on wet floors.  Avoid hot utensils and knives.  Position shelves so they are not too high or low.  Place commonly used objects within easy reach.  If necessary, use a sturdy step stool with a grab bar when reaching.  Keep electrical cables out of the way.  Do notuse floor polish or wax that makes floors slippery. If you must use wax, use non-skid floor wax.  Remove throw rugs and tripping hazards from the floor. STAIRWAYS  Never leave objects on stairs.  Place handrails on both sides of stairways and use them. Fix any loose handrails. Make sure handrails on both sides of the stairways are as long as the stairs.  Check carpeting to make sure it is firmly attached along stairs. Make repairs to worn or loose carpet promptly.  Avoid placing throw rugs at the top or bottom of stairways, or properly secure the rug with carpet tape to prevent slippage. Get rid of throw  rugs, if possible.  Have an electrician put in a light switch at the top and bottom of the stairs. OTHER FALL PREVENTION TIPS  Wear low-heel or rubber-soled shoes that are supportive and fit well. Wear closed toe shoes.  When using a stepladder, make sure it is fully opened and both spreaders are firmly locked. Do notclimb a closed stepladder.  Add color or contrast paint or tape to grab bars and handrails in your home. Place contrasting color strips on first and last  steps.  Learn and use mobility aids as needed. Install an electrical emergency response system.  Turn on lights to avoid dark areas. Replace light bulbs that burn out immediately. Get light switches that glow.  Arrange furniture to create clear pathways. Keep furniture in the same place.  Firmly attach carpet with non-skid or double-sided tape.  Eliminate uneven floor surfaces.  Select a carpet pattern that does not visually hide the edge of steps.  Be aware of all pets. OTHER HOME SAFETY TIPS  Set the water temperature for 120 F (48.8 C).  Keep emergency numbers on or near the telephone.  Keep smoke detectors on every level of the home and near sleeping areas. Document Released: 12/01/2002 Document Revised: 06/11/2012 Document Reviewed: 03/01/2012 Carpal Tunnel Syndrome  Carpal tunnel syndrome is a condition that causes pain in your hand and arm. The carpal tunnel is a narrow area located on the palm side of your wrist. Repeated wrist motion or certain diseases may cause swelling within the tunnel. This swelling pinches the main nerve in the wrist (median nerve). What are the causes? This condition may be caused by:  Repeated wrist motions.  Wrist injuries.  Arthritis.  A cyst or tumor in the carpal tunnel.  Fluid buildup during pregnancy. Sometimes the cause of this condition is not known. What increases the risk? This condition is more likely to develop in:  People who have jobs that cause them to repeatedly move their wrists in the same motion, such as Art gallery manager.  Women.  People with certain conditions, such as: ? Diabetes. ? Obesity. ? An underactive thyroid (hypothyroidism). ? Kidney failure. What are the signs or symptoms? Symptoms of this condition include:  A tingling feeling in your fingers, especially in your thumb, index, and middle fingers.  Tingling or numbness in your hand.  An aching feeling in your entire arm, especially when  your wrist and elbow are bent for long periods of time.  Wrist pain that goes up your arm to your shoulder.  Pain that goes down into your palm or fingers.  A weak feeling in your hands. You may have trouble grabbing and holding items. Your symptoms may feel worse during the night. How is this diagnosed? This condition is diagnosed with a medical history and physical exam. You may also have tests, including:  An electromyogram (EMG). This test measures electrical signals sent by your nerves into the muscles.  X-rays. How is this treated? Treatment for this condition includes:  Lifestyle changes. It is important to stop doing or modify the activity that caused your condition.  Physical or occupational therapy.  Medicines for pain and inflammation. This may include medicine that is injected into your wrist.  A wrist splint.  Surgery. Follow these instructions at home: If you have a splint:   Wear it as told by your health care provider. Remove it only as told by your health care provider.  Loosen the splint if your fingers become numb and  tingle, or if they turn cold and blue.  Keep the splint clean and dry. General instructions   Take over-the-counter and prescription medicines only as told by your health care provider.  Rest your wrist from any activity that may be causing your pain. If your condition is work related, talk to your employer about changes that can be made, such as getting a wrist pad to use while typing.  If directed, apply ice to the painful area: ? Put ice in a plastic bag. ? Place a towel between your skin and the bag. ? Leave the ice on for 20 minutes, 2-3 times per day.  Keep all follow-up visits as told by your health care provider. This is important.  Do any exercises as told by your health care provider, physical therapist, or occupational therapist. Contact a health care provider if:  You have new symptoms.  Your pain is not controlled with  medicines.  Your symptoms get worse. This information is not intended to replace advice given to you by your health care provider. Make sure you discuss any questions you have with your health care provider. Document Released: 12/08/2000 Document Revised: 04/20/2016 Document Reviewed: 08/22/2017 Elsevier Interactive Patient Education  2017 [employer].

## 2020-01-29 NOTE — Progress Notes (Signed)
Office Visit Note   [name]           Date of Birth: 17-Nov-1941           MRN: [id number] Visit Date: 01/29/2020              Requested by: Billie Ruddy, MD [place],  [place] [address] PCP: Billie Ruddy, MD   Assessment & Plan: Visit Diagnoses:  1. Spinal stenosis of lumbar region with neurogenic claudication   2. Spondylolisthesis, lumbar region   3. Cervical disc disorder with radiculopathy, unspecified cervical region   4. Cubital tunnel syndrome on left     Plan: Avoid overhead lifting and overhead use of the arms. Do not lift greater than 5 lbs. Adjust head rest in vehicle to prevent hyperextension if rear ended. Take extra precautions to avoid falling, including use of a cane if you feel weak. Avoid bending, stooping and avoid lifting weights greater than 10 lbs. Avoid prolong standing and walking. Avoid frequent bending and stooping  No lifting greater than 10 lbs. May use ice or moist heat for pain. Weight loss is of benefit. Handicap license is approved. 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 EMGs show mild CTS but do not explain all of her symptoms and findings. MRI of the cervicsl spine is scheduled for next week due to cervical radiculopathy.  Tylenol #3 for pain one every 6 hours prn pain. Fall Prevention and Home Safety Falls cause injuries and can affect all age groups. It is possible to use preventive measures to significantly decrease the likelihood of falls. There are many simple measures which can make your home safer and prevent falls. OUTDOORS  Repair cracks and edges of walkways and driveways.  Remove high doorway thresholds.  Trim shrubbery on the main path into your home.  Have good outside lighting.  Clear walkways of tools, rocks, debris, and clutter.  Check that handrails are not broken and are securely fastened. Both sides of steps should have handrails.  Have  leaves, snow, and ice cleared regularly.  Use sand or salt on walkways during winter months.  In the garage, clean up grease or oil spills. BATHROOM  Install night lights.  Install grab bars by the toilet and in the tub and shower.  Use non-skid mats or decals in the tub or shower.  Place a plastic non-slip stool in the shower to sit on, if needed.  Keep floors dry and clean up all water on the floor immediately.  Remove soap buildup in the tub or shower on a regular basis.  Secure bath mats with non-slip, double-sided rug tape.  Remove throw rugs and tripping hazards from the floors. BEDROOMS  Install night lights.  Make sure a bedside light is easy to reach.  Do notuse oversized bedding.  Keep a telephone by your bedside.  Have a firm chair with side arms to use for getting dressed.  Remove throw rugs and tripping hazards from the floor. KITCHEN  Keep handles on pots and pans turned toward the center of the stove. Use back burners when possible.  Clean up spills quickly and allow time for drying.  Avoid walking on wet floors.  Avoid hot utensils and knives.  Position shelves so they are not too high or low.  Place commonly used objects within easy reach.  If necessary, use a sturdy step stool with a grab bar when reaching.  Keep electrical cables out  of the way.  Do notuse floor polish or wax that makes floors slippery. If you must use wax, use non-skid floor wax.  Remove throw rugs and tripping hazards from the floor. STAIRWAYS  Never leave objects on stairs.  Place handrails on both sides of stairways and use them. Fix any loose handrails. Make sure handrails on both sides of the stairways are as long as the stairs.  Check carpeting to make sure it is firmly attached along stairs. Make repairs to worn or loose carpet promptly.  Avoid placing throw rugs at the top or bottom of stairways, or properly secure the rug with carpet tape to prevent  slippage. Get rid of throw rugs, if possible.  Have an electrician put in a light switch at the top and bottom of the stairs. OTHER FALL PREVENTION TIPS  Wear low-heel or rubber-soled shoes that are supportive and fit well. Wear closed toe shoes.  When using a stepladder, make sure it is fully opened and both spreaders are firmly locked. Do notclimb a closed stepladder.  Add color or contrast paint or tape to grab bars and handrails in your home. Place contrasting color strips on first and last steps.  Learn and use mobility aids as needed. Install an electrical emergency response system.  Turn on lights to avoid dark areas. Replace light bulbs that burn out immediately. Get light switches that glow.  Arrange furniture to create clear pathways. Keep furniture in the same place.  Firmly attach carpet with non-skid or double-sided tape.  Eliminate uneven floor surfaces.  Select a carpet pattern that does not visually hide the edge of steps.  Be aware of all pets. OTHER HOME SAFETY TIPS  Set the water temperature for 120 F (48.8 C).  Keep emergency numbers on or near the telephone.  Keep smoke detectors on every level of the home and near sleeping areas. Document Released: 12/01/2002 Document Revised: 06/11/2012 Document Reviewed: 03/01/2012 Carpal Tunnel Syndrome  Carpal tunnel syndrome is a condition that causes pain in your hand and arm. The carpal tunnel is a narrow area located on the palm side of your wrist. Repeated wrist motion or certain diseases may cause swelling within the tunnel. This swelling pinches the main nerve in the wrist (median nerve). What are the causes? This condition may be caused by:  Repeated wrist motions.  Wrist injuries.  Arthritis.  A cyst or tumor in the carpal tunnel.  Fluid buildup during pregnancy. Sometimes the cause of this condition is not known. What increases the risk? This condition is more likely to develop in:  People who  have jobs that cause them to repeatedly move their wrists in the same motion, such as Art gallery manager.  Women.  People with certain conditions, such as: ? Diabetes. ? Obesity. ? An underactive thyroid (hypothyroidism). ? Kidney failure. What are the signs or symptoms? Symptoms of this condition include:  A tingling feeling in your fingers, especially in your thumb, index, and middle fingers.  Tingling or numbness in your hand.  An aching feeling in your entire arm, especially when your wrist and elbow are bent for long periods of time.  Wrist pain that goes up your arm to your shoulder.  Pain that goes down into your palm or fingers.  A weak feeling in your hands. You may have trouble grabbing and holding items. Your symptoms may feel worse during the night. How is this diagnosed? This condition is diagnosed with a medical history and physical exam. You  may also have tests, including:  An electromyogram (EMG). This test measures electrical signals sent by your nerves into the muscles.  X-rays. How is this treated? Treatment for this condition includes:  Lifestyle changes. It is important to stop doing or modify the activity that caused your condition.  Physical or occupational therapy.  Medicines for pain and inflammation. This may include medicine that is injected into your wrist.  A wrist splint.  Surgery. Follow these instructions at home: If you have a splint:   Wear it as told by your health care provider. Remove it only as told by your health care provider.  Loosen the splint if your fingers become numb and tingle, or if they turn cold and blue.  Keep the splint clean and dry. General instructions   Take over-the-counter and prescription medicines only as told by your health care provider.  Rest your wrist from any activity that may be causing your pain. If your condition is work related, talk to your employer about changes that can be made, such as  getting a wrist pad to use while typing.  If directed, apply ice to the painful area: ? Put ice in a plastic bag. ? Place a towel between your skin and the bag. ? Leave the ice on for 20 minutes, 2-3 times per day.  Keep all follow-up visits as told by your health care provider. This is important.  Do any exercises as told by your health care provider, physical therapist, or occupational therapist. Contact a health care provider if:  You have new symptoms.  Your pain is not controlled with medicines.  Your symptoms get worse. This information is not intended to replace advice given to you by your health care provider. Make sure you discuss any questions you have with your health care provider. Document Released: 12/08/2000 Document Revised: 04/20/2016 Document Reviewed: 08/22/2017 Elsevier Interactive Patient Education  2017 [place] Instructions: Return in about 2 weeks (around 02/12/2020) for [facility] to come in for office visit in 2 weeks. .   Orders:  No orders of the defined types were placed in this encounter.  Meds ordered this encounter  Medications  . ALPRAZolam (XANAX) 0.5 MG tablet    Sig: Take one tablet at the MRI scanner and you may repeat after 30 minutes.    Dispense:  2 tablet    Refill:  0      Procedures: No procedures performed   Clinical Data: No additional findings.   Subjective: Chief Complaint  Patient presents with  . Left Wrist - Follow-up  . Left Hand - Follow-up    79 year old female with history of persisting left neck and shoulder pain and paresthesias from the left neck all the way down the left arm. It hurts and she has to take about a 1/2 hour to get comfortable. The tingling is in the left arm and above the left wrist,"all up and down the left arm".The results of the EMG/NCV is showing a very mild CTS. She has persistent left arm and shoulder pain. Walking with cane in the right hand and also uses a walker with wheels,  a seat and brakes quadroped. She is still in pain and her pain is about a "4-5" with tingling into the left arm and left hand. There is weakness left hand and fingers.     Review of Systems  Constitutional: Negative.  Negative for activity change, appetite change, chills, diaphoresis, fatigue, fever and unexpected weight change.  HENT:  Negative.  Negative for congestion, dental problem, drooling, ear discharge, ear pain, facial swelling, hearing loss, mouth sores, nosebleeds, postnasal drip, rhinorrhea, sinus pressure, sinus pain, sneezing, sore throat, tinnitus, trouble swallowing and voice change.   Eyes: Negative.  Negative for photophobia, pain, discharge, redness, itching and visual disturbance.  Respiratory: Negative.  Negative for apnea, cough, choking, chest tightness, shortness of breath, wheezing and stridor.   Cardiovascular: Negative.  Negative for chest pain, palpitations and leg swelling.  Gastrointestinal: Negative.  Negative for abdominal distention, abdominal pain, anal bleeding, blood in stool, constipation, diarrhea, nausea, rectal pain and vomiting.  Endocrine: Negative.  Negative for cold intolerance, heat intolerance, polydipsia, polyphagia and polyuria.  Genitourinary: Negative.  Negative for decreased urine volume, difficulty urinating, dyspareunia, dysuria, enuresis, flank pain, frequency, genital sores, hematuria, menstrual problem, pelvic pain, urgency, vaginal bleeding, vaginal discharge and vaginal pain.  Musculoskeletal: Positive for neck pain and neck stiffness. Negative for arthralgias, back pain, gait problem, joint swelling and myalgias.  Skin: Negative.   Allergic/Immunologic: Negative for environmental allergies, food allergies and immunocompromised state.  Neurological: Positive for weakness and numbness. Negative for dizziness, tremors, seizures, syncope, facial asymmetry, speech difficulty, light-headedness and headaches.  Hematological: Negative.  Negative for  adenopathy. Does not bruise/bleed easily.  Psychiatric/Behavioral: Negative.  Negative for agitation, behavioral problems, confusion, decreased concentration, dysphoric mood, hallucinations, self-injury, sleep disturbance and suicidal ideas. The patient is not nervous/anxious and is not hyperactive.      Objective: Vital Signs: BP (!) 189/76   Pulse 63   Ht 5' 1.5" (1.562 m)   Wt 258 lb (117 kg)   BMI 47.96 kg/m   Physical Exam Constitutional:      Appearance: She is well-developed.  HENT:     Head: Normocephalic and atraumatic.  Eyes:     Pupils: Pupils are equal, round, and reactive to light.  Pulmonary:     Effort: Pulmonary effort is normal.     Breath sounds: Normal breath sounds.  Abdominal:     General: Bowel sounds are normal.     Palpations: Abdomen is soft.  Musculoskeletal:     Cervical back: Normal range of motion and neck supple.     Lumbar back: Negative right straight leg raise test and negative left straight leg raise test.  Skin:    General: Skin is warm and dry.  Neurological:     Mental Status: She is alert and oriented to person, place, and time.  Psychiatric:        Behavior: Behavior normal.        Thought Content: Thought content normal.        Judgment: Judgment normal.     Back Exam   Tenderness  The patient is experiencing tenderness in the cervical.  Range of Motion  Extension: abnormal  Flexion: normal  Lateral bend right: abnormal  Lateral bend left: abnormal  Rotation right: abnormal  Rotation left: abnormal   Muscle Strength  Right Quadriceps:  5/5  Left Quadriceps:  5/5  Right Hamstrings:  5/5  Left Hamstrings:  5/5   Tests  Straight leg raise right: negative Straight leg raise left: negative  Reflexes  Biceps: 0/4 Babinski's sign: normal   Other  Toe walk: normal Heel walk: normal Erythema: no back redness Scars: absent  Comments:  Weak left biceps, left shoulder abduction and left triceps. Diffuse with  complaints of numbness and paresthesias entire left arm.       Specialty Comments:  No specialty comments available.  Imaging:  No results found.   PMFS History: Patient Active Problem List   Diagnosis Date Noted  . Spinal stenosis of lumbar region with neurogenic claudication 07/21/2019  . Post laminectomy syndrome 07/21/2019  . Type 2 diabetes mellitus with retinopathy of both eyes, with long-term current use of insulin ([place]) 07/02/2019  . Type 2 diabetes mellitus with diabetic polyneuropathy, with long-term current use of insulin ([place]) 07/02/2019  . Type 2 diabetes mellitus with hyperglycemia, with long-term current use of insulin ([place]) 07/02/2019  . Cellulitis of right leg 07/02/2019  . PAF (paroxysmal atrial fibrillation) ([place]) 11/12/2018  . Hyperlipidemia associated with type 2 diabetes mellitus ([place]) 11/12/2018  . Hypertension associated with diabetes ([place]) 02/28/2018  . Mild episode of recurrent major depressive disorder ([place]) 08/21/2017  . Morbid obesity ([place]) 01/15/2017  . Status post placement of implantable loop recorder 08/17/2016  . Syncope 02/02/2016  . Bradycardia 02/02/2016  . SOB (shortness of breath) 02/02/2016  . Pre-syncope 02/02/2016  . History of colonic polyps 01/11/2016  . Type 2 diabetes mellitus with circulatory disorder, with long-term current use of insulin ([place]) 01/11/2016  . Chronic back pain - [facility] at [facility] 03/12/2014  . Gout 06/30/2013  . Hypothyroidism 06/30/2013  . Iron deficiency anemia 06/30/2013  . Venous stasis of lower extremity 01/30/2013  . Refusal of blood transfusion for reasons of conscience 01/30/2013  . Lumbar disc disease with radiculopathy 01/30/2013   Past Medical History:  Diagnosis Date  . Anemia   . Arthritis   . Cancer ([place]) 1969   , Status post complete hysterectomy per patient  . Cataract    removed both eyes with lens implant both   . Chicken pox   . Chronic bronchitis ([place])   . Colon polyp    . Depression 06/30/2013  . Diabetes mellitus without complication ([place])   . Diverticulitis   . Frequent headaches   . GERD (gastroesophageal reflux disease)   . Gout 06/30/2013  . Hx of dizziness   . Hyperlipidemia   . Hypertension   . Post-operative nausea and vomiting   . S/P epidural steroid injection 09/27/2016   done in back at [facility]   . Spinal stenosis of lumbar region    s/p spine surgery 02/12/13 with Dr Marcos Eke at [facility]  . Thyroid disease   . Urine incontinence   . Use of cane as ambulatory aid     Family History  Problem Relation Age of Onset  . Alcohol abuse Father   . Cirrhosis Father   . Heart attack Brother   . Breast cancer Sister        Reports all family members with breast cancer have had genetic testing that was negative  . Cancer Mother        renal cancer  . Stroke Paternal Grandmother   . Alcohol abuse Brother   . Breast cancer Sister        renal cancer  . Diabetes Brother   . Heart Problems Daughter   . Healthy Daughter   . Healthy Daughter   . Healthy Son   . Healthy Son   . Arthritis Other        parents  . Hyperlipidemia Other        parent  . Hypertension Other        parent/grandparent  . Diabetes Other        parent/grandparent  . Heart disease Other   . Mental illness Other   . Colon cancer Neg Hx   .  Esophageal cancer Neg Hx   . Prostate cancer Neg Hx   . Rectal cancer Neg Hx   . Seizures Neg Hx   . Colon polyps Neg Hx     Past Surgical History:  Procedure Laterality Date  . ABDOMINAL HYSTERECTOMY  1970   complete hysterectomy for cervical cancer  . BACK SURGERY  01/2013  . BACK SURGERY  1995  . CATARACT EXTRACTION, BILATERAL     with lens implant   . CERVICAL CONE BIOPSY  1970  . COLONOSCOPY    . EP IMPLANTABLE DEVICE N/A 02/15/2016   Procedure: Loop Recorder Insertion;  Surgeon: Sanda Klein, MD;  Location: [facility];  Service: Cardiovascular;  Laterality: N/A;  . POLYPECTOMY    . WRIST SURGERY  2010   for  wrist fracture   Social History   Occupational History  . Occupation: Radiation protection practitioner    Comment: retired  Tobacco Use  . Smoking status: Former Smoker    Quit date: 12/25/1964    Years since quitting: 55.1  . Smokeless tobacco: Never Used  . Tobacco comment: smoked 6 years / when she was in college   Substance and Sexual Activity  . Alcohol use: Yes    Comment: occ with steak dinner   . Drug use: No  . Sexual activity: Not on file

## 2020-01-29 NOTE — Telephone Encounter (Signed)
Error

## 2020-02-02 ENCOUNTER — Other Ambulatory Visit: Payer: Self-pay | Admitting: Family Medicine

## 2020-02-02 ENCOUNTER — Telehealth: Payer: Self-pay | Admitting: Adult Health

## 2020-02-02 NOTE — Telephone Encounter (Signed)
This was last refilled by PCP but will route to MD to advise if OK to change therapy given insurance coverage

## 2020-02-02 NOTE — Telephone Encounter (Signed)
New message   Pt c/o medication issue:  1. Name of Medication:isosorbide dinitrate (ISORDIL) 20 MG tablet  2. How are you currently taking this medication (dosage and times per day)? As written  3. Are you having a reaction (difficulty breathing--STAT)? n/a  4. What is your medication issue?patient states that she has [employer] now and this medicine will not be covered by her insurance wants to discuss a substitute for this medication. Please call.

## 2020-02-03 MED ORDER — ISOSORBIDE MONONITRATE ER 30 MG PO TB24: 30.0000 mg | ORAL_TABLET | Freq: Every day | ORAL | 3 refills | Status: AC

## 2020-02-03 NOTE — Telephone Encounter (Signed)
Patient called with MD recommendation for med change. Rx(s) sent to pharmacy electronically.

## 2020-02-03 NOTE — Telephone Encounter (Signed)
Ok to try imdur 30 mg daily  DR H

## 2020-02-04 ENCOUNTER — Ambulatory Visit
Admission: RE | Admit: 2020-02-04 | Discharge: 2020-02-04 | Disposition: A | Discharge status: Home / Self Care | Payer: Medicare PPO | Source: Ambulatory Visit | Attending: Specialist | Admitting: Specialist

## 2020-02-04 ENCOUNTER — Other Ambulatory Visit: Payer: Self-pay

## 2020-02-04 ENCOUNTER — Telehealth: Payer: Self-pay | Admitting: Specialist

## 2020-02-04 DIAGNOSIS — M542 Cervicalgia: Secondary | ICD-10-CM

## 2020-02-04 NOTE — Telephone Encounter (Signed)
I called and spoke with patient she asked if she could take the gabapentin to help with the pain and I advised that she could do this.  I advised that she needs to call [facility] to resched that her scan. She was given their number and she will call them to r/s

## 2020-02-04 NOTE — Telephone Encounter (Signed)
Patient called.   She is requesting a change in pain medicine and she needs to reschedule her MRI. She had to cancel her appointment today because of her pain   Call back number: [phone]

## 2020-02-05 ENCOUNTER — Telehealth: Payer: Self-pay | Admitting: Specialist

## 2020-02-05 NOTE — Telephone Encounter (Signed)
Patient called advised she tried to get the MRI done but she  was in so much pain she could  not get through the MRI. Patient asked what can help her to relax enough so that she can get through the MRI. Patient said she need a call back to advised pn the pain and trying to relax during the MRI. Patient had to reschedule the MRI.

## 2020-02-05 NOTE — Telephone Encounter (Signed)
Patient called advised she tried to get the MRI done but she  was in so much pain she could  not get through the MRI. Patient asked what can help her to relax enough so that she can get through the MRI. Patient said she need a call back to advised pn the pain and trying to relax during the MRI. Patient had to reschedule the MRI. The number to contact patient is [phone]

## 2020-02-16 ENCOUNTER — Telehealth: Payer: Self-pay | Admitting: Internal Medicine

## 2020-02-16 MED ORDER — IRBESARTAN 150 MG PO TABS: 150.0000 mg | ORAL_TABLET | Freq: Every day | ORAL | 3 refills | Status: AC

## 2020-02-16 NOTE — Telephone Encounter (Signed)
Returned call to patient-she states she is trying to get her medication correct.    She is wondering if she is suppose to be taking isosorbide mononitrate (Imdur) 30 mg and isosorbide dinitrate (Isordil) 20 mg.   Advised per chart review-she should ONLY be taking isosorbide (Imdur) 30 mg daily. She also states insurance is not wanting to cover her irbesartan.   Called and spoke to pharmacist at [facility].   She believes insurance is denying it because its written as 2 tablets (75mg  tablet) daily instead of a 150mg  tablet.    Will send new rx.  If insurance denies, [facility] will notify office.     Patient updated and verbalized understanding.

## 2020-02-16 NOTE — Telephone Encounter (Signed)
Pt c/o medication issue:  1. Name of Medication: isosorbide mononitrate (IMDUR) 30 MG 24 hr tablet / Isosorbide Dinitrate 20mg   2. How are you currently taking this medication (dosage and times per day)? As directed  3. Are you having a reaction (difficulty breathing--STAT)? no  4. What is your medication issue? Patient didn't realize she was taking both of these medications until she went to refill her medication box. Her pharmacist states that she does not think she should be taking them together, patient calling to get confirmation on what she is supposed to be taking.

## 2020-02-20 ENCOUNTER — Ambulatory Visit: Payer: Medicare PPO | Admitting: Specialist

## 2020-02-22 ENCOUNTER — Encounter: Payer: Self-pay | Admitting: Family Medicine

## 2020-02-24 ENCOUNTER — Telehealth: Payer: Self-pay | Admitting: Specialist

## 2020-02-24 NOTE — Telephone Encounter (Signed)
Patient called to inquire about something for pain to do her MRI. She went a couple of weeks ago and was unable to do MRI due to pain. It was r/s and she will need something for pain in order to do MRI.  She will also need something for relaxation. She has 1 pill left for relaxation and should she take a Tylenol#3?  Please call patient to advise.  [phone]

## 2020-02-25 NOTE — Telephone Encounter (Signed)
Patient called to inquire about something for pain to do her MRI. She went a couple of weeks ago and was unable to do MRI due to pain. It was r/s and she will need something for pain in order to do MRI.   She will also need something for relaxation. She has 1 pill left for relaxation and should she take a Tylenol#3?   Please call patient to advise.

## 2020-02-27 ENCOUNTER — Other Ambulatory Visit: Payer: Medicare PPO

## 2020-03-01 DIAGNOSIS — E113311 Type 2 diabetes mellitus with moderate nonproliferative diabetic retinopathy with macular edema, right eye: Secondary | ICD-10-CM | POA: Diagnosis not present

## 2020-03-01 DIAGNOSIS — E113313 Type 2 diabetes mellitus with moderate nonproliferative diabetic retinopathy with macular edema, bilateral: Secondary | ICD-10-CM | POA: Diagnosis not present

## 2020-03-02 ENCOUNTER — Encounter: Payer: Medicare PPO | Admitting: Vascular Surgery

## 2020-03-02 ENCOUNTER — Telehealth: Payer: Self-pay | Admitting: Specialist

## 2020-03-02 ENCOUNTER — Encounter (HOSPITAL_COMMUNITY): Payer: Medicare PPO

## 2020-03-02 NOTE — Telephone Encounter (Signed)
Patient called.   She had to reschedule her MRI due to the extreme level of discomfort she was feeling. It was rescheduled for the 14th and she wants to know if she can be prescribed something to get through it or discuss an alternative.   She also needs her appointment for Friday moved in accordance to the new MRI date.   Call back: [phone]

## 2020-03-04 ENCOUNTER — Telehealth: Payer: Self-pay | Admitting: Specialist

## 2020-03-04 NOTE — Telephone Encounter (Signed)
FYI:  Patient called stating she had rescheduled her MRI to 03/05/20 tomorrow and had left several messages that she is unable to do it because of the pain she is having.   Read the response that Alyse Low has forwarded to [facility] and awaiting a response. Alyse Low will try and get Nitka to possibly call her something in today advised me to tell patient to check with her pharmacy possibly later today.

## 2020-03-04 NOTE — Telephone Encounter (Signed)
Can oyu please send in something to help her get thru her MRI tomorrow?

## 2020-03-05 ENCOUNTER — Other Ambulatory Visit: Payer: Self-pay | Admitting: Specialist

## 2020-03-05 ENCOUNTER — Ambulatory Visit: Payer: Medicare PPO | Admitting: Specialist

## 2020-03-05 MED ORDER — DIAZEPAM 5 MG PO TABS: ORAL_TABLET | ORAL | 0 refills | Status: AC

## 2020-03-05 NOTE — Telephone Encounter (Signed)
I called and advised patient that Dr. Louanne Skye sen there in some medication to help her with the MRI.

## 2020-03-07 ENCOUNTER — Ambulatory Visit
Admission: RE | Admit: 2020-03-07 | Discharge: 2020-03-07 | Disposition: A | Discharge status: Home / Self Care | Payer: Medicare PPO | Source: Ambulatory Visit | Attending: Specialist | Admitting: Specialist

## 2020-03-07 ENCOUNTER — Other Ambulatory Visit: Payer: Self-pay

## 2020-03-07 DIAGNOSIS — M542 Cervicalgia: Secondary | ICD-10-CM

## 2020-03-07 DIAGNOSIS — M4802 Spinal stenosis, cervical region: Secondary | ICD-10-CM | POA: Diagnosis not present

## 2020-03-08 ENCOUNTER — Telehealth: Payer: Self-pay | Admitting: Family Medicine

## 2020-03-08 NOTE — Telephone Encounter (Signed)
Pt daughter [name] would like advice on how to get safety equipment in pt bathroom. What does she need from provider so that insurance will cover it.    [name] [phone]

## 2020-03-08 NOTE — Telephone Encounter (Signed)
Pt sent mychart message on 02/22/20 about completion of paperwork she said it has been two weeks and she wanted to follow up to see if it is complete or if there were any issues. Please give pt a call back with follow.

## 2020-03-08 NOTE — Telephone Encounter (Signed)
Spoke with pt and pt daughter advised that pt form for [facility] had been completed and faxed to [place] care, pt state that she needed the form mailed to her so she could send it together with her income information. Pt advised that the form will be mailed out to her address. Form mailed out today on 03/08/2020

## 2020-03-14 ENCOUNTER — Other Ambulatory Visit: Payer: Self-pay | Admitting: Internal Medicine

## 2020-03-15 ENCOUNTER — Telehealth: Payer: Self-pay | Admitting: Family Medicine

## 2020-03-15 ENCOUNTER — Other Ambulatory Visit: Payer: Self-pay

## 2020-03-15 MED ORDER — INSULIN LISPRO PROT & LISPRO (75-25 MIX) 100 UNIT/ML KWIKPEN: 12.0000 [IU] | PEN_INJECTOR | Freq: Two times a day (BID) | SUBCUTANEOUS | 1 refills | Status: DC

## 2020-03-15 NOTE — Telephone Encounter (Signed)
Rx sent to pt pharmacy 

## 2020-03-15 NOTE — Telephone Encounter (Signed)
Medication Refill: Insulin  [facility]  Phone:

## 2020-03-17 ENCOUNTER — Telehealth (INDEPENDENT_AMBULATORY_CARE_PROVIDER_SITE_OTHER): Payer: Medicare PPO | Admitting: Family Medicine

## 2020-03-17 ENCOUNTER — Telehealth: Payer: Self-pay

## 2020-03-17 DIAGNOSIS — L03115 Cellulitis of right lower limb: Secondary | ICD-10-CM | POA: Diagnosis not present

## 2020-03-17 DIAGNOSIS — I878 Other specified disorders of veins: Secondary | ICD-10-CM

## 2020-03-17 MED ORDER — AMOXICILLIN 500 MG PO CAPS: 500.0000 mg | ORAL_CAPSULE | Freq: Two times a day (BID) | ORAL | 0 refills | Status: AC

## 2020-03-17 NOTE — Progress Notes (Signed)
Virtual Visit via Video Note  I connected with Katie Mcintyre on 03/17/20 at  3:00 PM EDT by a video enabled telemedicine application 2/2 [id number] pandemic and verified that I am speaking with the correct person using two identifiers.  Location patient: home Location provider:work or home office Persons participating in the virtual visit: patient, provider, and pt's daughter  I discussed the limitations of evaluation and management by telemedicine and the availability of in person appointments. The patient expressed understanding and agreed to proceed.   HPI: Pt is a 79 yo female with pmh sig for GERD, HTN, HLD, chronic back pain managed at [facility] s/p laminectomy, arthritis, h/o cancer, spinal stenosis, gout, hypothyroidism, Afib, DM II with neuropathy, diabetic retinopathy, and venous stasis.  Pt notes her leg has become warm, swollen, and sore to the touch.  Denies n/v, chills, fever.  Unable to wear compression hose as difficult to get on.  Propping up legs aggravates her bursa.  Pt seen by Ortho.  Pt cancelled appt with Vascular as she will be moving to [place] to stay with her daughter soon.    Pt inquires about assistance program forms for her insulin.  States was able to get some of the medication, but still having difficulty.  Pt's daughter inquires about an in home aid for pt.    ROS: See pertinent positives and negatives per HPI.  Past Medical History:  Diagnosis Date  . Anemia   . Arthritis   . Cancer ([place]) 1969   , Status post complete hysterectomy per patient  . Cataract    removed both eyes with lens implant both   . Chicken pox   . Chronic bronchitis ([place])   . Colon polyp   . Depression 06/30/2013  . Diabetes mellitus without complication ([place])   . Diverticulitis   . Frequent headaches   . GERD (gastroesophageal reflux disease)   . Gout 06/30/2013  . Hx of dizziness   . Hyperlipidemia   . Hypertension   . Post-operative nausea and vomiting   . S/P epidural steroid  injection 09/27/2016   done in back at [place]   . Spinal stenosis of lumbar region    s/p spine surgery 02/12/13 with Dr Marcos Eke at [facility]  . Thyroid disease   . Urine incontinence   . Use of cane as ambulatory aid     Past Surgical History:  Procedure Laterality Date  . ABDOMINAL HYSTERECTOMY  1970   complete hysterectomy for cervical cancer  . BACK SURGERY  01/2013  . BACK SURGERY  1995  . CATARACT EXTRACTION, BILATERAL     with lens implant   . CERVICAL CONE BIOPSY  1970  . COLONOSCOPY    . EP IMPLANTABLE DEVICE N/A 02/15/2016   Procedure: Loop Recorder Insertion;  Surgeon: Sanda Klein, MD;  Location: [facility];  Service: Cardiovascular;  Laterality: N/A;  . POLYPECTOMY    . WRIST SURGERY  2010   for wrist fracture    Family History  Problem Relation Age of Onset  . Alcohol abuse Father   . Cirrhosis Father   . Heart attack Brother   . Breast cancer Sister        Reports all family members with breast cancer have had genetic testing that was negative  . Cancer Mother        renal cancer  . Stroke Paternal Grandmother   . Alcohol abuse Brother   . Breast cancer Sister        renal  cancer  . Diabetes Brother   . Heart Problems Daughter   . Healthy Daughter   . Healthy Daughter   . Healthy Son   . Healthy Son   . Arthritis Other        parents  . Hyperlipidemia Other        parent  . Hypertension Other        parent/grandparent  . Diabetes Other        parent/grandparent  . Heart disease Other   . Mental illness Other   . Colon cancer Neg Hx   . Esophageal cancer Neg Hx   . Prostate cancer Neg Hx   . Rectal cancer Neg Hx   . Seizures Neg Hx   . Colon polyps Neg Hx       Current Outpatient Medications:  .  ACCU-CHEK AVIVA PLUS test strip, USE AS INSTRUCTED TO CHECK BLOOD SUGAR TWICE A DAY * E11.9*, Disp: 100 strip, Rfl: 5 .  ACCU-CHEK FASTCLIX LANCETS MISC, USE AS DIRECTED, Disp: 102 each, Rfl: 5 .  acetaminophen (TYLENOL) 500 MG tablet, Take  1,000 mg by mouth every 8 (eight) hours as needed., Disp: , Rfl:  .  acetaminophen-codeine (TYLENOL #3) 300-30 MG tablet, Take 1 tablet by mouth every 4 (four) hours as needed for moderate pain., Disp: 30 tablet, Rfl: 0 .  ALPRAZolam (XANAX) 0.5 MG tablet, Take one tablet at the MRI scanner and you may repeat after 30 minutes., Disp: 2 tablet, Rfl: 0 .  amLODipine (NORVASC) 5 MG tablet, Take 1 tablet by mouth once daily, Disp: 85 tablet, Rfl: 0 .  atorvastatin (LIPITOR) 40 MG tablet, TAKE 1 TABLET BY MOUTH EVERY DAY, Disp: 90 tablet, Rfl: 3 .  B-D UF III MINI PEN NEEDLES 31G X 5 MM MISC, USE TO INJECT INSULIN TWICE A DAY, Disp: 100 each, Rfl: 3 .  Blood Glucose Calibration (ACCU-CHEK AVIVA) SOLN, Use as directed., Disp: 1 each, Rfl: 1 .  calcium-vitamin D (OSCAL 500/200 D-3) 500-200 MG-UNIT per tablet, Take 1 tablet by mouth daily., Disp: , Rfl:  .  carvedilol (COREG) 6.25 MG tablet, Take 1 tablet (6.25 mg total) by mouth 2 (two) times daily., Disp: 180 tablet, Rfl: 3 .  cloNIDine (CATAPRES) 0.1 MG tablet, TAKE 1 TABLET BY MOUTH TWICE A DAY, Disp: 180 tablet, Rfl: 2 .  diazepam (VALIUM) 5 MG tablet, Take one tablet po at the MRI scanner and may repeat times one after 30 minutes if no relief of anxiety., Disp: 2 tablet, Rfl: 0 .  ferrous sulfate 325 (65 FE) MG tablet, Take 325 mg by mouth daily with breakfast., Disp: , Rfl:  .  gabapentin (NEURONTIN) 100 MG capsule, Take 1 capsule (100 mg total) by mouth at bedtime., Disp: 90 capsule, Rfl: 3 .  hydrALAZINE (APRESOLINE) 50 MG tablet, TAKE 1 TABLET BY MOUTH THREE TIMES A DAY, Disp: 270 tablet, Rfl: 3 .  Insulin Lispro Prot & Lispro (HUMALOG MIX 75/25 KWIKPEN) (75-25) 100 UNIT/ML Kwikpen, Inject 12 Units into the skin 2 (two) times daily with a meal., Disp: 15 mL, Rfl: 1 .  irbesartan (AVAPRO) 150 MG tablet, Take 1 tablet (150 mg total) by mouth daily., Disp: 90 tablet, Rfl: 3 .  isosorbide mononitrate (IMDUR) 30 MG 24 hr tablet, Take 1 tablet (30 mg  total) by mouth daily., Disp: 90 tablet, Rfl: 3 .  Lancet Devices (ACCU-CHEK SOFTCLIX) lancets, Use as instructed twice a day, Disp: 1 each, Rfl: 5 .  levothyroxine (SYNTHROID) 125 MCG  tablet, TAKE 1 TABLET BY MOUTH EVERY DAY, Disp: 90 tablet, Rfl: 1 .  metFORMIN (GLUCOPHAGE) 1000 MG tablet, TAKE 1 TABLET BY MOUTH TWICE A DAY WITH MEALS, Disp: 180 tablet, Rfl: 0 .  Sennosides-Docusate Sodium (STOOL SOFTENER & LAXATIVE PO), Take 1 tablet by mouth daily as needed (constipation). , Disp: , Rfl:  .  vitamin B-12 (CYANOCOBALAMIN) 1000 MCG tablet, Take by mouth., Disp: , Rfl:   Current Facility-Administered Medications:  .  0.9 %  sodium chloride infusion, 500 mL, Intravenous, Once, Pyrtle, Lajuan Lines, MD .  ipratropium-albuterol (DUONEB) 0.5-2.5 (3) MG/3ML nebulizer solution 3 mL, 3 mL, Nebulization, Once, Kordsmeier, [place], FNP  EXAM:  VITALS per patient if applicable:  RR between 12-20 bpm  GENERAL: alert, oriented, appears well and in no acute distress  HEENT: atraumatic, conjunctiva clear, no obvious abnormalities on inspection of external nose and ears  NECK: normal movements of the head and neck  LUNGS: on inspection no signs of respiratory distress, breathing rate appears normal, no obvious gross SOB, gasping or wheezing  CV: no obvious cyanosis  SKIN:  R leg anterior shin with erythema, lumpy and edematous in appearance.  Picture taken, however unable to move from haiku to note.  LLE with venous stasis changes.  MS: moves all visible extremities without noticeable abnormality  PSYCH/NEURO: pleasant and cooperative, no obvious depression or anxiety, speech and thought processing grossly intact  ASSESSMENT AND PLAN:  Discussed the following assessment and plan:  Cellulitis of right leg  -elevated LE -discussed wound care -given precautions for continued or worsened symptoms.   - Plan: amoxicillin (AMOXIL) 500 MG capsule  Venous stasis of lower extremity -elevated LEs -discussed  f/u with wound center and vascular surgery when settled in [facility]  Discussed [facility] and an aide to help pt.  Consider setting up in [place] since moving in a few wks.  F/u in the next few days prn  Forms were completed several wks ago and mailed to pt per CMA.  Copy of forms sent to scan, but if still in office will fax a copy to the assistance program.    I discussed the assessment and treatment plan with the patient. The patient was provided an opportunity to ask questions and all were answered. The patient agreed with the plan and demonstrated an understanding of the instructions.   The patient was advised to call back or seek an in-person evaluation if the symptoms worsen or if the condition fails to improve as anticipated.   Billie Ruddy, MD

## 2020-03-17 NOTE — Telephone Encounter (Signed)
Spoke with pt and daughter advised that her [facility] care form was mailed out to her home address as requested by pt, Pt provided the [facility] number to call and follow up with the processing of the paperwork, pt verbalized understanding

## 2020-03-23 NOTE — Telephone Encounter (Signed)
disregard

## 2020-03-28 ENCOUNTER — Other Ambulatory Visit: Payer: Self-pay | Admitting: Family Medicine

## 2020-03-29 NOTE — Telephone Encounter (Signed)
Pt stated [facility] [address]

## 2020-03-31 ENCOUNTER — Other Ambulatory Visit: Payer: Self-pay | Admitting: Specialist

## 2020-03-31 ENCOUNTER — Encounter: Payer: Self-pay | Admitting: Specialist

## 2020-03-31 ENCOUNTER — Other Ambulatory Visit: Payer: Self-pay

## 2020-03-31 ENCOUNTER — Ambulatory Visit: Payer: Medicare PPO | Admitting: Specialist

## 2020-03-31 VITALS — BP 187/84 | HR 64 | Ht 61.5 in | Wt 258.0 lb

## 2020-03-31 DIAGNOSIS — M542 Cervicalgia: Secondary | ICD-10-CM

## 2020-03-31 DIAGNOSIS — M5010 Cervical disc disorder with radiculopathy, unspecified cervical region: Secondary | ICD-10-CM | POA: Diagnosis not present

## 2020-03-31 DIAGNOSIS — M47812 Spondylosis without myelopathy or radiculopathy, cervical region: Secondary | ICD-10-CM | POA: Diagnosis not present

## 2020-03-31 MED ORDER — CHLORZOXAZONE 500 MG PO TABS: 250.0000 mg | ORAL_TABLET | Freq: Four times a day (QID) | ORAL | 0 refills | Status: DC | PRN

## 2020-03-31 NOTE — Progress Notes (Signed)
Office Visit Note   [name]           Date of Birth: January 24, 1941           MRN: [id number] Visit Date: 03/31/2020              Requested by: Billie Ruddy, MD [place],  [place] [address] PCP: Billie Ruddy, MD   Assessment & Plan: Visit Diagnoses:  1. Cervical disc disorder with radiculopathy, unspecified cervical region   2. Cervicalgia   3. Spondylosis without myelopathy or radiculopathy, cervical region     Plan: Avoid overhead lifting and overhead use of the arms. Do not lift greater than 5 lbs. Adjust head rest in vehicle to prevent hyperextension if rear ended. Take extra precautions to avoid falling, including use of a cane if you feel weak. Recommend PT with cervical McKenzie exercises and traction to improve ROM and decrease nerve irritation. Your MRI show multiple level arthritis with nerve pinch at multiple levels both sides. Relieving the pain may be possible with therapy but if it is not of benefit selective nerve root blocks would allow [place] To determine the major source of the pain and consider surgery that is localized to the specific nerve causing your  Pain.  If you do move to [place], I would recommend Dr. Blair Hailey, a spine surgeon with [facility] for  Treatment there.   Follow-Up Instructions: Return in about 4 weeks (around 04/28/2020).   Orders:  Orders Placed This Encounter  Procedures  . Ambulatory referral to Physical Therapy   Meds ordered this encounter  Medications  . chlorzoxazone (PARAFON FORTE DSC) 500 MG tablet    Sig: Take 0.5 tablets (250 mg total) by mouth every 6 (six) hours as needed for muscle spasms.    Dispense:  30 tablet    Refill:  0      Procedures: No procedures performed   Clinical Data: Findings:  CLINICAL DATA:  Neck pain radiating to the right shoulder for 2-1/2 months. No known injury.  EXAM: MRI CERVICAL SPINE WITHOUT  CONTRAST  TECHNIQUE: Multiplanar, multisequence MR imaging of the cervical spine was performed. No intravenous contrast was administered.  COMPARISON:  Plain film cervical spine 01/05/2020.  FINDINGS: Alignment: Normal.  Vertebrae: No fracture, evidence of discitis, or bone lesion.  Cord: Normal signal throughout.  Posterior Fossa, vertebral arteries, paraspinal tissues: Negative.  Disc levels:  C2-3: Shallow disc bulge and mild facet degenerative disease. No stenosis.  C3-4: There is moderate facet arthropathy on the left and some uncovertebral spurring. Minimal posterior bony ridging. Moderately severe left foraminal narrowing. The central canal and right foramen are open.  C4-5: Mild posterior bony ridging and uncovertebral disease. There is some facet degenerative disease on the left. The central canal and right foramen are open. Moderate left foraminal narrowing noted.  C5-6: The patient has a shallow disc bulge, uncovertebral disease and moderate bilateral facet degenerative change, worse on the right. The ventral thecal sac is effaced. Moderately severe to severe foraminal narrowing is worse on the right.  C6-7: There is a shallow disc bulge, uncovertebral disease and mild facet arthropathy. The ventral thecal sac is narrowed but not effaced. Moderate to moderately severe foraminal narrowing is worse on the right.  C7-T1: There is some facet degenerative disease.  No stenosis.  IMPRESSION: Spondylosis appears worst at C5-6 where there is moderately severe to severe bilateral foraminal narrowing, worse on the right. The ventral thecal  sac is effaced at C5-6.  The ventral thecal sac is narrowed but not effaced at C6-7. Moderate to moderately severe bilateral foraminal narrowing at C6-7 is worse on the right.  Moderately severe left foraminal narrowing C3-4.   Electronically Signed   By: Inge Rise M.D.   On: 03/07/2020  16:39    Subjective: Chief Complaint  Patient presents with  . Neck - Follow-up    MRI Review    79 year old right handed female with cervicalgia and pain into the arms more than the neck. She has pain into the left shoulder and radiates into the left finger tips Pain with turning the head and neck. Turning the head to the right causes a straining sensation. There is pain with extension of th neck and difficulty when she lies down at night with neck pain. No bowel or bladder difficulty, takes a stool softener, has to be careful not to take too much. She is walking but doesn't have the stamina and is having pin in the back of the knee on the left. Children Want to move her to [place] in the near.    Review of Systems  Constitutional: Negative.  Negative for activity change, appetite change, chills, diaphoresis, fatigue, fever and unexpected weight change.  HENT: Negative.  Negative for congestion, dental problem, drooling, ear discharge, ear pain, facial swelling, hearing loss, mouth sores, nosebleeds, postnasal drip, rhinorrhea, sinus pressure, sinus pain, sneezing, sore throat, tinnitus, trouble swallowing and voice change.   Eyes: Negative.  Negative for photophobia, pain, discharge, redness, itching and visual disturbance.  Respiratory: Negative.   Cardiovascular: Negative.   Endocrine: Negative.   Genitourinary: Negative.   Musculoskeletal: Positive for back pain, neck pain and neck stiffness.  Skin: Negative.   Allergic/Immunologic: Negative.   Neurological: Positive for weakness and numbness. Negative for dizziness, tremors, seizures, syncope, facial asymmetry, speech difficulty, light-headedness and headaches.  Hematological: Negative.   Psychiatric/Behavioral: Negative.      Objective: Vital Signs: BP (!) 187/84 (BP Location: Left Arm, Patient Position: Sitting)   Pulse 64   Ht 5' 1.5" (1.562 m)   Wt 258 lb (117 kg)   BMI 47.96 kg/m   Physical Exam Constitutional:       Appearance: She is well-developed.  HENT:     Head: Normocephalic and atraumatic.  Eyes:     Pupils: Pupils are equal, round, and reactive to light.  Pulmonary:     Effort: Pulmonary effort is normal.     Breath sounds: Normal breath sounds.  Abdominal:     General: Bowel sounds are normal.     Palpations: Abdomen is soft.  Musculoskeletal:     Cervical back: Normal range of motion and neck supple.     Lumbar back: Negative right straight leg raise test and negative left straight leg raise test.  Skin:    General: Skin is warm and dry.  Neurological:     Mental Status: She is alert and oriented to person, place, and time.  Psychiatric:        Behavior: Behavior normal.        Thought Content: Thought content normal.        Judgment: Judgment normal.     Back Exam   Tenderness  The patient is experiencing tenderness in the cervical.  Range of Motion  Extension: abnormal  Flexion: abnormal  Lateral bend right: abnormal  Lateral bend left: abnormal  Rotation right: abnormal  Rotation left: abnormal   Muscle Strength  Right Quadriceps:  5/5  Left Quadriceps:  5/5  Right Hamstrings:  5/5  Left Hamstrings:  5/5   Tests  Straight leg raise right: negative Straight leg raise left: negative  Reflexes  Patellar: normal Achilles: normal Babinski's sign: normal   Other  Toe walk: normal Heel walk: normal Erythema: no back redness Scars: absent      Specialty Comments:  No specialty comments available.  Imaging: No results found.   PMFS History: Patient Active Problem List   Diagnosis Date Noted  . Spinal stenosis of lumbar region with neurogenic claudication 07/21/2019  . Post laminectomy syndrome 07/21/2019  . Type 2 diabetes mellitus with retinopathy of both eyes, with long-term current use of insulin ([place]) 07/02/2019  . Type 2 diabetes mellitus with diabetic polyneuropathy, with long-term current use of insulin ([place]) 07/02/2019  . Type 2  diabetes mellitus with hyperglycemia, with long-term current use of insulin ([place]) 07/02/2019  . Cellulitis of right leg 07/02/2019  . PAF (paroxysmal atrial fibrillation) ([place]) 11/12/2018  . Hyperlipidemia associated with type 2 diabetes mellitus ([place]) 11/12/2018  . Hypertension associated with diabetes ([place]) 02/28/2018  . Mild episode of recurrent major depressive disorder ([place]) 08/21/2017  . Morbid obesity ([place]) 01/15/2017  . Status post placement of implantable loop recorder 08/17/2016  . Syncope 02/02/2016  . Bradycardia 02/02/2016  . SOB (shortness of breath) 02/02/2016  . Pre-syncope 02/02/2016  . History of colonic polyps 01/11/2016  . Type 2 diabetes mellitus with circulatory disorder, with long-term current use of insulin ([place]) 01/11/2016  . Chronic back pain - managed by Dr[facility] Marcos [facility]ke at [facility] 03/12/2014  . Gout 06/30/2013  . Hypothyroidism 06/30/2013  . Iron deficiency anemia 06/30/2013  . Venous stasis of lower extremity 01/30/2013  . Refusal of blood transfusion for reasons of conscience 01/30/2013  . Lumbar disc disease with radiculopathy 01/30/2013   Past Medical History:  Diagnosis Date  . Anemia   . Arthritis   . Cancer ([place]) 1969   , Status post complete hysterectomy per patient  . Cataract    removed both eyes with lens implant both   . Chicken pox   . Chronic bronchitis ([place])   . Colon polyp   . Depression 06/30/2013  . Diabetes mellitus without complication ([place])   . Diverticulitis   . Frequent headaches   . GERD (gastroesophageal reflux disease)   . Gout 06/30/2013  . Hx of dizziness   . Hyperlipidemia   . Hypertension   . Post-operative nausea and vomiting   . S/P epidural steroid injection 09/27/2016   done in back at [place]   . Spinal stenosis of lumbar region    s/p spine surgery 02/12/13 with Dr Marcos Eke at [facility]  . Thyroid disease   . Urine incontinence   . Use of cane as ambulatory aid     Family History  Problem Relation Age of Onset  .  Alcohol abuse Father   . Cirrhosis Father   . Heart attack Brother   . Breast cancer Sister        Reports all family members with breast cancer have had genetic testing that was negative  . Cancer Mother        renal cancer  . Stroke Paternal Grandmother   . Alcohol abuse Brother   . Breast cancer Sister        renal cancer  . Diabetes Brother   . Heart Problems Daughter   . Healthy Daughter   . Healthy Daughter   .  Healthy Son   . Healthy Son   . Arthritis Other        parents  . Hyperlipidemia Other        parent  . Hypertension Other        parent/grandparent  . Diabetes Other        parent/grandparent  . Heart disease Other   . Mental illness Other   . Colon cancer Neg Hx   . Esophageal cancer Neg Hx   . Prostate cancer Neg Hx   . Rectal cancer Neg Hx   . Seizures Neg Hx   . Colon polyps Neg Hx     Past Surgical History:  Procedure Laterality Date  . ABDOMINAL HYSTERECTOMY  1970   complete hysterectomy for cervical cancer  . BACK SURGERY  01/2013  . BACK SURGERY  1995  . CATARACT EXTRACTION, BILATERAL     with lens implant   . CERVICAL CONE BIOPSY  1970  . COLONOSCOPY    . EP IMPLANTABLE DEVICE N/A 02/15/2016   Procedure: Loop Recorder Insertion;  Surgeon: Sanda Klein, MD;  Location: [facility];  Service: Cardiovascular;  Laterality: N/A;  . POLYPECTOMY    . WRIST SURGERY  2010   for wrist fracture   Social History   Occupational History  . Occupation: Radiation protection practitioner    Comment: retired  Tobacco Use  . Smoking status: Former Smoker    Quit date: 12/25/1964    Years since quitting: 55.3  . Smokeless tobacco: Never Used  . Tobacco comment: smoked 6 years / when she was in college   Substance and Sexual Activity  . Alcohol use: Yes    Comment: occ with steak dinner   . Drug use: No  . Sexual activity: Not on file

## 2020-03-31 NOTE — Patient Instructions (Signed)
Avoid overhead lifting and overhead use of the arms. Do not lift greater than 5 lbs. Adjust head rest in vehicle to prevent hyperextension if rear ended. Take extra precautions to avoid falling, including use of a cane if you feel weak. Recommend PT with cervical McKenzie exercises and traction to improve ROM and decrease nerve irritation. Your MRI show multiple level arthritis with nerve pinch at multiple levels both sides. Relieving the pain may be possible with therapy but if it is not of benefit selective nerve root blocks would allow [place] To determine the major source of the pain and consider surgery that is localized to the specific nerve causing your  Pain.  If you do move to [place], I would recommend Dr. Blair Hailey, a spine surgeon with [facility] for  Treatment there.

## 2020-04-01 ENCOUNTER — Telehealth: Payer: Self-pay

## 2020-04-01 NOTE — Telephone Encounter (Signed)
Pt called wanting the address to medtronic return kit because she lost it and I called [employer] and was able to get that emailed to her.

## 2020-04-08 ENCOUNTER — Telehealth: Payer: Self-pay | Admitting: Family Medicine

## 2020-04-08 NOTE — Telephone Encounter (Signed)
Pt would like to know if you can re send her [facility] forms to help with the assistance of her insulin. Thanks [facility] phone # [phone]

## 2020-04-08 NOTE — Telephone Encounter (Signed)
Pt Katie Mcintyre care Materials [employer] form was refax to [facility] per pt request

## 2020-04-09 ENCOUNTER — Other Ambulatory Visit (INDEPENDENT_AMBULATORY_CARE_PROVIDER_SITE_OTHER): Payer: Self-pay | Admitting: Specialist

## 2020-04-09 ENCOUNTER — Telehealth: Payer: Self-pay | Admitting: Podiatry

## 2020-04-09 ENCOUNTER — Telehealth: Payer: Self-pay

## 2020-04-09 NOTE — Telephone Encounter (Signed)
Pt Katie Mcintyre care form was mailed out to pt correct address 04/09/2020

## 2020-04-09 NOTE — Telephone Encounter (Signed)
Please advise 

## 2020-04-09 NOTE — Telephone Encounter (Signed)
Pt called to see if Dr.Galaway would recommend a good foot Dr. In the [place] area

## 2020-04-11 ENCOUNTER — Other Ambulatory Visit: Payer: Self-pay | Admitting: Family Medicine

## 2020-04-12 ENCOUNTER — Other Ambulatory Visit: Payer: Self-pay

## 2020-04-12 MED ORDER — METFORMIN HCL 1000 MG PO TABS: 1000.0000 mg | ORAL_TABLET | Freq: Two times a day (BID) | ORAL | 0 refills | Status: AC

## 2020-04-13 ENCOUNTER — Other Ambulatory Visit: Payer: Self-pay

## 2020-04-13 ENCOUNTER — Telehealth: Payer: Self-pay | Admitting: Family Medicine

## 2020-04-13 MED ORDER — INSULIN LISPRO PROT & LISPRO (75-25 MIX) 100 UNIT/ML KWIKPEN: 12.0000 [IU] | PEN_INJECTOR | Freq: Two times a day (BID) | SUBCUTANEOUS | 1 refills | Status: AC

## 2020-04-13 NOTE — Telephone Encounter (Signed)
Spoke with pt state that she moved over the weekend to [place] and she doesn't have more Humalog left ,pt state that her previous pharmacy is working to transfer her refills to the new pharmacy but she will run out of the insulin after today. [facility] [place] [facility]

## 2020-04-13 NOTE — Telephone Encounter (Signed)
Pt is calling in needing a refill on her insulin HUMALOG MIX 75/25 KWIKPEN 100 UNIT   [facility] [address] [facility] [place],  [address]  [phone].

## 2020-04-16 ENCOUNTER — Ambulatory Visit: Payer: Medicare PPO | Admitting: Podiatry

## 2020-04-18 ENCOUNTER — Other Ambulatory Visit: Payer: Self-pay | Admitting: Family Medicine

## 2020-04-19 ENCOUNTER — Other Ambulatory Visit: Payer: Self-pay

## 2020-04-19 MED ORDER — AMLODIPINE BESYLATE 5 MG PO TABS: 5.0000 mg | ORAL_TABLET | Freq: Every day | ORAL | 0 refills | Status: DC

## 2020-04-19 MED ORDER — CLONIDINE HCL 0.1 MG PO TABS: 0.1000 mg | ORAL_TABLET | Freq: Two times a day (BID) | ORAL | 1 refills | Status: AC

## 2020-04-19 NOTE — Telephone Encounter (Signed)
Pt has moved to [place] and has a new pharmacy on file

## 2020-05-03 DIAGNOSIS — E039 Hypothyroidism, unspecified: Secondary | ICD-10-CM | POA: Diagnosis not present

## 2020-05-03 DIAGNOSIS — M2040 Other hammer toe(s) (acquired), unspecified foot: Secondary | ICD-10-CM | POA: Diagnosis not present

## 2020-05-03 DIAGNOSIS — E11319 Type 2 diabetes mellitus with unspecified diabetic retinopathy without macular edema: Secondary | ICD-10-CM | POA: Diagnosis not present

## 2020-05-03 DIAGNOSIS — E119 Type 2 diabetes mellitus without complications: Secondary | ICD-10-CM | POA: Diagnosis not present

## 2020-05-03 DIAGNOSIS — E1151 Type 2 diabetes mellitus with diabetic peripheral angiopathy without gangrene: Secondary | ICD-10-CM | POA: Diagnosis not present

## 2020-05-03 DIAGNOSIS — I872 Venous insufficiency (chronic) (peripheral): Secondary | ICD-10-CM | POA: Diagnosis not present

## 2020-05-03 DIAGNOSIS — E113599 Type 2 diabetes mellitus with proliferative diabetic retinopathy without macular edema, unspecified eye: Secondary | ICD-10-CM | POA: Diagnosis not present

## 2020-05-03 DIAGNOSIS — M4800 Spinal stenosis, site unspecified: Secondary | ICD-10-CM | POA: Diagnosis not present

## 2020-05-03 DIAGNOSIS — I10 Essential (primary) hypertension: Secondary | ICD-10-CM | POA: Diagnosis not present

## 2020-05-03 DIAGNOSIS — M79605 Pain in left leg: Secondary | ICD-10-CM | POA: Diagnosis not present

## 2020-05-11 ENCOUNTER — Other Ambulatory Visit: Payer: Self-pay | Admitting: Internal Medicine

## 2020-05-14 ENCOUNTER — Telehealth: Payer: Self-pay | Admitting: Internal Medicine

## 2020-05-14 NOTE — Telephone Encounter (Signed)
Called patient back and addressed questions  Dr Lemmie Evens

## 2020-05-14 NOTE — Telephone Encounter (Signed)
Attempted to get patient scheduled for appt 05/14/20. Patient stated she no longer lives in the area, moved to [place], has new primary care provider and they didn't recommend she continue care under cardiology. She wants Dr. Lysbeth Penner personal opinion on whether she should seek care and if so wo would Dr. Debara Pickett recommend and give referral to someone in the [place] area. Patient also wants to speak with Dr. Debara Pickett pertaining to this matter.

## 2020-05-17 DIAGNOSIS — I872 Venous insufficiency (chronic) (peripheral): Secondary | ICD-10-CM | POA: Diagnosis not present

## 2020-05-17 DIAGNOSIS — M549 Dorsalgia, unspecified: Secondary | ICD-10-CM | POA: Diagnosis not present

## 2020-05-17 DIAGNOSIS — I10 Essential (primary) hypertension: Secondary | ICD-10-CM | POA: Diagnosis not present

## 2020-05-17 DIAGNOSIS — M25519 Pain in unspecified shoulder: Secondary | ICD-10-CM | POA: Diagnosis not present

## 2020-05-17 DIAGNOSIS — E1151 Type 2 diabetes mellitus with diabetic peripheral angiopathy without gangrene: Secondary | ICD-10-CM | POA: Diagnosis not present

## 2020-05-31 DIAGNOSIS — L84 Corns and callosities: Secondary | ICD-10-CM | POA: Diagnosis not present

## 2020-05-31 DIAGNOSIS — L603 Nail dystrophy: Secondary | ICD-10-CM | POA: Diagnosis not present

## 2020-05-31 DIAGNOSIS — M2041 Other hammer toe(s) (acquired), right foot: Secondary | ICD-10-CM | POA: Diagnosis not present

## 2020-05-31 DIAGNOSIS — E1142 Type 2 diabetes mellitus with diabetic polyneuropathy: Secondary | ICD-10-CM | POA: Diagnosis not present

## 2020-06-10 ENCOUNTER — Encounter: Payer: Self-pay | Admitting: Physical Medicine and Rehabilitation

## 2020-06-10 DIAGNOSIS — Z6841 Body Mass Index (BMI) 40.0 and over, adult: Secondary | ICD-10-CM | POA: Diagnosis not present

## 2020-06-10 DIAGNOSIS — M5412 Radiculopathy, cervical region: Secondary | ICD-10-CM | POA: Diagnosis not present

## 2020-06-10 NOTE — Progress Notes (Signed)
Katie Mcintyre - 80 y.o. female MRN [id number]  Date of birth: 1941-10-06  Office Visit Note: Visit Date: 12/16/2019 PCP: Billie Ruddy, MD Referred by: Billie Ruddy, MD  Subjective: Chief Complaint  Patient presents with  . Lower Back - Pain  . Right Leg - Pain  . Right Arm - Pain   HPI: Katie Mcintyre is a 79 y.o. female who comes in today For two distinct problems.  She comes in today for scheduled and planned right L3 transforaminal epidural steroid injection as requested by Dr. Basil Dess.  She is also describing left arm pain and numbness.  She reports prior injection for the back and right hip and leg did seem to give her relief for several weeks but the pain started to return.  She has worsening with sitting in a hard chair better when she stretches her legs out.  In terms of her neck pain and arm pain has been ongoing situation.  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  She has had prior conservative care for her neck and arm pain but no injections by our practice.  She is mainly been seen by Dr. Louanne Skye for her lower back.  She denies any right-sided complaints.  She is right-hand dominant.  She has not had prior electrodiagnostic study.  She does get some numbness and tingling in the left hand consistent possibly with a carpal tunnel syndrome.  Again she does have diabetes.  Review of Systems  Musculoskeletal: Positive for back pain and joint pain.  Neurological: Positive for tingling.  All other systems reviewed and are negative.  Otherwise per HPI.  Assessment & Plan: Visit Diagnoses:  1. Lumbar radiculopathy   2. Spinal stenosis of lumbar region with neurogenic claudication   3. Post laminectomy syndrome   4. Chronic right shoulder pain   5. Right arm pain   6. Paresthesia of skin     Plan: Findings:  Continue today with right L3 transforaminal epidural steroid injection which is a repeat injection.  Patient did get more than 50% relief  for several weeks.  She will follow up with Dr. Merrilee Seashore in this regard.  In terms of her neck shoulder and arm pain this seems to be multifactorial given prior history.  I have asked her return to see Dr. Louanne Skye for evaluation but also would suggest electrodiagnostic study to rule out carpal tunnel syndrome.    Meds & Orders:  Meds ordered this encounter  Medications  . dexamethasone (DECADRON) injection 15 mg    Orders Placed This Encounter  Procedures  . XR C-ARM NO REPORT  . Epidural Steroid injection    Follow-up: Return in about 4 weeks (around 01/13/2020) for Basil Dess, MD.   Procedures: No procedures performed  Lumbosacral Transforaminal Epidural Steroid Injection - Sub-Pedicular Approach with Fluoroscopic Guidance  [name]      Date of Birth: Sep 12, 1941 MRN: [id number] PCP: Billie Ruddy, MD      Visit Date: 12/16/2019   Universal Protocol:    Date/Time: 12/16/2019  Consent Given By: the patient  Position: PRONE  Additional Comments: Vital signs were monitored before and after the procedure. Patient was prepped and draped in the usual sterile fashion. The correct patient, procedure, and site was verified.   Injection Procedure Details:  Procedure Site One Meds Administered:  Meds ordered this encounter  Medications  . dexamethasone (DECADRON) injection 15 mg    Laterality: Right  Location/Site:  L3-L4  Needle size: 22 G  Needle type: Spinal  Needle Placement: Transforaminal  Findings:    -Comments: Excellent flow of contrast along the nerve and into the epidural space.  Procedure Details: After squaring off the end-plates to get a true AP view, the C-arm was positioned so that an oblique view of the foramen as noted above was visualized. The target area is just inferior to the "nose of the scotty dog" or sub pedicular. The soft tissues overlying this structure were infiltrated with 2-3 ml. of 1% Lidocaine without Epinephrine.  The  spinal needle was inserted toward the target using a "trajectory" view along the fluoroscope beam.  Under AP and lateral visualization, the needle was advanced so it did not puncture dura and was located close the 6 O'Clock position of the pedical in AP tracterory. Biplanar projections were used to confirm position. Aspiration was confirmed to be negative for CSF and/or blood. A 1-2 ml. volume of Isovue-250 was injected and flow of contrast was noted at each level. Radiographs were obtained for documentation purposes.   After attaining the desired flow of contrast documented above, a 0.5 to 1.0 ml test dose of 0.25% Marcaine was injected into each respective transforaminal space.  The patient was observed for 90 seconds post injection.  After no sensory deficits were reported, and normal lower extremity motor function was noted,   the above injectate was administered so that equal amounts of the injectate were placed at each foramen (level) into the transforaminal epidural space.   Additional Comments:  The patient tolerated the procedure well Dressing: 2 x 2 sterile gauze and Band-Aid    Post-procedure details: Patient was observed during the procedure. Post-procedure instructions were reviewed.  Patient left the clinic in stable condition.     Clinical History: FINDINGS:  There is lumbosacral transitional anatomy. For the purpose of this dictation, the last well-formed disc space is designated as S1-S2 and labeled on image as such.  Minimal anterolisthesis of L4 on L5. Normal lumbar spinal alignment otherwise. Status post L3-L5 laminectomy. Marrow is unremarkable. Conus terminates at approximately L1. The visualized spinal cord morphology and signal, and the cauda equina appear normal. Negative for epidural hematoma. Small left renal cysts. Retroperitoneal soft tissues are unremarkable. Sacroiliac joints are normal.  --T11-T12: Imaged on the sagittal plane only. Large posterior disc  osteophyte complex impression on ventral cord. No cord signal abnormality. Moderate bilateral neuroforaminal narrowing. --T12-L1: Mild posterior disc bulge without canal or foraminal narrowing. Mild bilateral facet degenerative disease.  --L1-L2: There is no evidence of spinal stenosis, disc bulge or neural foraminal narrowing. Mild left facet degenerative disease. --L2-L3: Large posterior disc osteophyte complex and severe bilateral facet hypertrophy, resulting in moderate spinal canal narrowing and severe bilateral neuroforamina narrowing.  --L3-L4: Broad-based disc bulge and severe bilateral facet hypertrophy, resulting in the severe spinal canal and bilateral neural foraminal narrowing. --L4-L5: Status post laminectomy. Broad based disc bulge and severe bilateral facet hypertrophy resulting in mild spinal canal narrowing and severe left and mild right neuroforaminal narrowing.  --L5-S1: Status post laminectomy. No disc bulge or spinal canal narrowing. Severe bilateral neuroforamina narrowing. Severe bilateral facet hypertrophy   IMPRESSION: 1. Spinal canal narrowing is moderate at L2-L3, severe at L3-L4, and mild at L4-L5, mostly from degenerative facet disease. 2. Multilevel neuroforaminal narrowing, which is severe at bilateral L2-L3, bilateral L3-L4,, left L4-L5, and bilateral L5-S1.   Electronically Reviewed KD:TOIZT Roosevelt Locks, MD Electronically Reviewed on:09/09/2016 7:55 PM   She reports that she  quit smoking about 55 years ago. She has never used smokeless tobacco.  Recent Labs    07/02/19 1103  HGBA1C 7.1*    Objective:  VS:  HT:    WT:   BMI:     BP:(!) 188/85  HR:73bpm  TEMP: ( )  RESP:  Physical Exam Vitals and nursing note reviewed.  Constitutional:      General: She is not in acute distress.    Appearance: Normal appearance. She is well-developed. She is obese. She is not ill-appearing.  HENT:     Head: Normocephalic and atraumatic.  Eyes:      Conjunctiva/sclera: Conjunctivae normal.     Pupils: Pupils are equal, round, and reactive to light.  Cardiovascular:     Rate and Rhythm: Normal rate.     Pulses: Normal pulses.  Pulmonary:     Effort: Pulmonary effort is normal.  Musculoskeletal:     Right lower leg: No edema.     Left lower leg: No edema.     Comments: Patient has difficulty going from sit to stand in full extension.  She has some concordant pain with facet loading.  No pain with hip rotation.  Pain over the ischial bursa.  She has good distal strength examination of the left arm shows some shoulder impingement with positive Phalen's on the left.  She has intact sensation.  Skin:    General: Skin is warm and dry.     Findings: No erythema or rash.  Neurological:     General: No focal deficit present.     Mental Status: She is alert and oriented to person, place, and time.     Sensory: No sensory deficit.     Motor: No abnormal muscle tone.     Coordination: Coordination normal.     Gait: Gait normal.  Psychiatric:        Mood and Affect: Mood normal.        Behavior: Behavior normal.     Ortho Exam  Imaging: No results found.  Past Medical/Family/Surgical/Social History: Medications & Allergies reviewed per EMR, new medications updated. Patient Active Problem List   Diagnosis Date Noted  . Spinal stenosis of lumbar region with neurogenic claudication 07/21/2019  . Post laminectomy syndrome 07/21/2019  . Type 2 diabetes mellitus with retinopathy of both eyes, with long-term current use of insulin ([place]) 07/02/2019  . Type 2 diabetes mellitus with diabetic polyneuropathy, with long-term current use of insulin ([place]) 07/02/2019  . Type 2 diabetes mellitus with hyperglycemia, with long-term current use of insulin ([place]) 07/02/2019  . Cellulitis of right leg 07/02/2019  . PAF (paroxysmal atrial fibrillation) ([place]) 11/12/2018  . Hyperlipidemia associated with type 2 diabetes mellitus ([place]) 11/12/2018  .  Hypertension associated with diabetes ([place]) 02/28/2018  . Mild episode of recurrent major depressive disorder ([place]) 08/21/2017  . Morbid obesity ([place]) 01/15/2017  . Status post placement of implantable loop recorder 08/17/2016  . Syncope 02/02/2016  . Bradycardia 02/02/2016  . SOB (shortness of breath) 02/02/2016  . Pre-syncope 02/02/2016  . History of colonic polyps 01/11/2016  . Type 2 diabetes mellitus with circulatory disorder, with long-term current use of insulin ([place]) 01/11/2016  . Chronic back pain - managed by Dr[facility] Mar[facility]ke at [facility] 03/12/2014  . Gout 06/30/2013  . Hypothyroidism 06/30/2013  . Iron deficiency anemia 06/30/2013  . Venous stasis of lower extremity 01/30/2013  . Refusal of blood transfusion for reasons of conscience 01/30/2013  . Lumbar disc disease with radiculopathy 01/30/2013  Past Medical History:  Diagnosis Date  . Anemia   . Arthritis   . Cancer ([place]) 1969   , Status post complete hysterectomy per patient  . Cataract    removed both eyes with lens implant both   . Chicken pox   . Chronic bronchitis ([place])   . Colon polyp   . Depression 06/30/2013  . Diabetes mellitus without complication ([place])   . Diverticulitis   . Frequent headaches   . GERD (gastroesophageal reflux disease)   . Gout 06/30/2013  . Hx of dizziness   . Hyperlipidemia   . Hypertension   . Post-operative nausea and vomiting   . S/P epidural steroid injection 09/27/2016   done in back at [place]   . Spinal stenosis of lumbar region    s/p spine surgery 02/12/13 with Dr Marcos Eke at [facility]  . Thyroid disease   . Urine incontinence   . Use of cane as ambulatory aid    Family History  Problem Relation Age of Onset  . Alcohol abuse Father   . Cirrhosis Father   . Heart attack Brother   . Breast cancer Sister        Reports all family members with breast cancer have had genetic testing that was negative  . Cancer Mother        renal cancer  . Stroke Paternal Grandmother   . Alcohol  abuse Brother   . Breast cancer Sister        renal cancer  . Diabetes Brother   . Heart Problems Daughter   . Healthy Daughter   . Healthy Daughter   . Healthy Son   . Healthy Son   . Arthritis Other        parents  . Hyperlipidemia Other        parent  . Hypertension Other        parent/grandparent  . Diabetes Other        parent/grandparent  . Heart disease Other   . Mental illness Other   . Colon cancer Neg Hx   . Esophageal cancer Neg Hx   . Prostate cancer Neg Hx   . Rectal cancer Neg Hx   . Seizures Neg Hx   . Colon polyps Neg Hx    Past Surgical History:  Procedure Laterality Date  . ABDOMINAL HYSTERECTOMY  1970   complete hysterectomy for cervical cancer  . BACK SURGERY  01/2013  . BACK SURGERY  1995  . CATARACT EXTRACTION, BILATERAL     with lens implant   . CERVICAL CONE BIOPSY  1970  . COLONOSCOPY    . EP IMPLANTABLE DEVICE N/A 02/15/2016   Procedure: Loop Recorder Insertion;  Surgeon: Sanda Klein, MD;  Location: [facility];  Service: Cardiovascular;  Laterality: N/A;  . POLYPECTOMY    . WRIST SURGERY  2010   for wrist fracture   Social History   Occupational History  . Occupation: Radiation protection practitioner    Comment: retired  Tobacco Use  . Smoking status: Former Smoker    Quit date: 12/25/1964    Years since quitting: 55.4  . Smokeless tobacco: Never Used  . Tobacco comment: smoked 6 years / when she was in college   Substance and Sexual Activity  . Alcohol use: Yes    Comment: occ with steak dinner   . Drug use: No  . Sexual activity: Not on file

## 2020-06-18 ENCOUNTER — Telehealth: Payer: Self-pay | Admitting: Family Medicine

## 2020-06-18 NOTE — Telephone Encounter (Signed)
Spoke with patient she stated she moved to [place] due to her health and is established with another provider

## 2020-06-21 DIAGNOSIS — Z794 Long term (current) use of insulin: Secondary | ICD-10-CM | POA: Diagnosis not present

## 2020-06-21 DIAGNOSIS — I10 Essential (primary) hypertension: Secondary | ICD-10-CM | POA: Diagnosis not present

## 2020-06-21 DIAGNOSIS — M542 Cervicalgia: Secondary | ICD-10-CM | POA: Diagnosis not present

## 2020-06-21 DIAGNOSIS — E119 Type 2 diabetes mellitus without complications: Secondary | ICD-10-CM | POA: Diagnosis not present

## 2020-07-05 ENCOUNTER — Other Ambulatory Visit: Payer: Self-pay | Admitting: Family Medicine

## 2020-07-05 DIAGNOSIS — R5383 Other fatigue: Secondary | ICD-10-CM | POA: Diagnosis not present

## 2020-07-05 DIAGNOSIS — E119 Type 2 diabetes mellitus without complications: Secondary | ICD-10-CM | POA: Diagnosis not present

## 2020-07-05 DIAGNOSIS — Z20822 Contact with and (suspected) exposure to covid-19: Secondary | ICD-10-CM | POA: Diagnosis not present

## 2020-07-05 DIAGNOSIS — R531 Weakness: Secondary | ICD-10-CM | POA: Diagnosis not present

## 2020-07-05 DIAGNOSIS — J9809 Other diseases of bronchus, not elsewhere classified: Secondary | ICD-10-CM | POA: Diagnosis not present

## 2020-07-05 DIAGNOSIS — E039 Hypothyroidism, unspecified: Secondary | ICD-10-CM | POA: Diagnosis not present

## 2020-07-05 DIAGNOSIS — R110 Nausea: Secondary | ICD-10-CM | POA: Diagnosis not present

## 2020-07-05 DIAGNOSIS — R109 Unspecified abdominal pain: Secondary | ICD-10-CM | POA: Diagnosis not present

## 2020-07-05 DIAGNOSIS — R1010 Upper abdominal pain, unspecified: Secondary | ICD-10-CM | POA: Diagnosis not present

## 2020-07-05 DIAGNOSIS — I442 Atrioventricular block, complete: Secondary | ICD-10-CM | POA: Diagnosis not present

## 2020-07-05 DIAGNOSIS — I081 Rheumatic disorders of both mitral and tricuspid valves: Secondary | ICD-10-CM | POA: Diagnosis not present

## 2020-07-05 DIAGNOSIS — R1013 Epigastric pain: Secondary | ICD-10-CM | POA: Diagnosis not present

## 2020-07-05 DIAGNOSIS — R638 Other symptoms and signs concerning food and fluid intake: Secondary | ICD-10-CM | POA: Diagnosis not present

## 2020-07-05 DIAGNOSIS — R600 Localized edema: Secondary | ICD-10-CM | POA: Diagnosis not present

## 2020-07-05 DIAGNOSIS — E1165 Type 2 diabetes mellitus with hyperglycemia: Secondary | ICD-10-CM | POA: Diagnosis not present

## 2020-07-05 DIAGNOSIS — R339 Retention of urine, unspecified: Secondary | ICD-10-CM | POA: Diagnosis not present

## 2020-07-05 DIAGNOSIS — G8929 Other chronic pain: Secondary | ICD-10-CM | POA: Diagnosis not present

## 2020-07-05 DIAGNOSIS — E669 Obesity, unspecified: Secondary | ICD-10-CM | POA: Diagnosis not present

## 2020-07-05 DIAGNOSIS — R778 Other specified abnormalities of plasma proteins: Secondary | ICD-10-CM | POA: Diagnosis not present

## 2020-07-05 DIAGNOSIS — Z87898 Personal history of other specified conditions: Secondary | ICD-10-CM | POA: Diagnosis not present

## 2020-07-05 DIAGNOSIS — R001 Bradycardia, unspecified: Secondary | ICD-10-CM | POA: Diagnosis not present

## 2020-07-05 DIAGNOSIS — E1140 Type 2 diabetes mellitus with diabetic neuropathy, unspecified: Secondary | ICD-10-CM | POA: Diagnosis not present

## 2020-07-05 DIAGNOSIS — I10 Essential (primary) hypertension: Secondary | ICD-10-CM | POA: Diagnosis not present

## 2020-07-05 NOTE — Telephone Encounter (Signed)
Patient need to schedule an ov for more refills. 

## 2020-07-06 DIAGNOSIS — Z20822 Contact with and (suspected) exposure to covid-19: Secondary | ICD-10-CM | POA: Diagnosis not present

## 2020-07-06 DIAGNOSIS — R339 Retention of urine, unspecified: Secondary | ICD-10-CM | POA: Diagnosis not present

## 2020-07-06 DIAGNOSIS — G8929 Other chronic pain: Secondary | ICD-10-CM | POA: Diagnosis not present

## 2020-07-06 DIAGNOSIS — E1140 Type 2 diabetes mellitus with diabetic neuropathy, unspecified: Secondary | ICD-10-CM | POA: Diagnosis not present

## 2020-07-06 DIAGNOSIS — E669 Obesity, unspecified: Secondary | ICD-10-CM | POA: Diagnosis not present

## 2020-07-06 DIAGNOSIS — E039 Hypothyroidism, unspecified: Secondary | ICD-10-CM | POA: Diagnosis not present

## 2020-07-06 DIAGNOSIS — I10 Essential (primary) hypertension: Secondary | ICD-10-CM | POA: Diagnosis not present

## 2020-07-06 DIAGNOSIS — Z45018 Encounter for adjustment and management of other part of cardiac pacemaker: Secondary | ICD-10-CM | POA: Diagnosis not present

## 2020-07-06 DIAGNOSIS — K5900 Constipation, unspecified: Secondary | ICD-10-CM | POA: Diagnosis not present

## 2020-07-06 DIAGNOSIS — R001 Bradycardia, unspecified: Secondary | ICD-10-CM | POA: Diagnosis not present

## 2020-07-06 DIAGNOSIS — E1165 Type 2 diabetes mellitus with hyperglycemia: Secondary | ICD-10-CM | POA: Diagnosis not present

## 2020-07-06 DIAGNOSIS — I160 Hypertensive urgency: Secondary | ICD-10-CM | POA: Diagnosis not present

## 2020-07-06 DIAGNOSIS — I442 Atrioventricular block, complete: Secondary | ICD-10-CM | POA: Diagnosis not present

## 2020-07-07 ENCOUNTER — Other Ambulatory Visit: Payer: Self-pay | Admitting: Family Medicine

## 2020-07-07 DIAGNOSIS — Z20822 Contact with and (suspected) exposure to covid-19: Secondary | ICD-10-CM | POA: Diagnosis not present

## 2020-07-07 DIAGNOSIS — K5900 Constipation, unspecified: Secondary | ICD-10-CM | POA: Diagnosis not present

## 2020-07-07 DIAGNOSIS — E1140 Type 2 diabetes mellitus with diabetic neuropathy, unspecified: Secondary | ICD-10-CM | POA: Diagnosis not present

## 2020-07-07 DIAGNOSIS — R001 Bradycardia, unspecified: Secondary | ICD-10-CM | POA: Diagnosis not present

## 2020-07-07 DIAGNOSIS — I160 Hypertensive urgency: Secondary | ICD-10-CM | POA: Diagnosis not present

## 2020-07-07 DIAGNOSIS — E039 Hypothyroidism, unspecified: Secondary | ICD-10-CM | POA: Diagnosis not present

## 2020-07-07 DIAGNOSIS — E669 Obesity, unspecified: Secondary | ICD-10-CM | POA: Diagnosis not present

## 2020-07-07 DIAGNOSIS — E1165 Type 2 diabetes mellitus with hyperglycemia: Secondary | ICD-10-CM | POA: Diagnosis not present

## 2020-07-07 DIAGNOSIS — G8929 Other chronic pain: Secondary | ICD-10-CM | POA: Diagnosis not present

## 2020-07-07 DIAGNOSIS — I442 Atrioventricular block, complete: Secondary | ICD-10-CM | POA: Diagnosis not present

## 2020-07-07 DIAGNOSIS — R339 Retention of urine, unspecified: Secondary | ICD-10-CM | POA: Diagnosis not present

## 2020-07-07 DIAGNOSIS — I10 Essential (primary) hypertension: Secondary | ICD-10-CM | POA: Diagnosis not present

## 2020-07-08 DIAGNOSIS — R339 Retention of urine, unspecified: Secondary | ICD-10-CM | POA: Diagnosis not present

## 2020-07-08 DIAGNOSIS — E1165 Type 2 diabetes mellitus with hyperglycemia: Secondary | ICD-10-CM | POA: Diagnosis not present

## 2020-07-08 DIAGNOSIS — R001 Bradycardia, unspecified: Secondary | ICD-10-CM | POA: Diagnosis not present

## 2020-07-08 DIAGNOSIS — I442 Atrioventricular block, complete: Secondary | ICD-10-CM | POA: Diagnosis not present

## 2020-07-08 DIAGNOSIS — I160 Hypertensive urgency: Secondary | ICD-10-CM | POA: Diagnosis not present

## 2020-07-08 DIAGNOSIS — K5900 Constipation, unspecified: Secondary | ICD-10-CM | POA: Diagnosis not present

## 2020-07-08 DIAGNOSIS — M7989 Other specified soft tissue disorders: Secondary | ICD-10-CM | POA: Diagnosis not present

## 2020-07-08 DIAGNOSIS — E1140 Type 2 diabetes mellitus with diabetic neuropathy, unspecified: Secondary | ICD-10-CM | POA: Diagnosis not present

## 2020-07-08 DIAGNOSIS — E039 Hypothyroidism, unspecified: Secondary | ICD-10-CM | POA: Diagnosis not present

## 2020-07-08 DIAGNOSIS — Z20822 Contact with and (suspected) exposure to covid-19: Secondary | ICD-10-CM | POA: Diagnosis not present

## 2020-07-08 DIAGNOSIS — G8929 Other chronic pain: Secondary | ICD-10-CM | POA: Diagnosis not present

## 2020-07-08 DIAGNOSIS — E669 Obesity, unspecified: Secondary | ICD-10-CM | POA: Diagnosis not present

## 2020-07-08 DIAGNOSIS — I10 Essential (primary) hypertension: Secondary | ICD-10-CM | POA: Diagnosis not present

## 2020-07-09 DIAGNOSIS — R001 Bradycardia, unspecified: Secondary | ICD-10-CM | POA: Diagnosis not present

## 2020-07-09 DIAGNOSIS — G8929 Other chronic pain: Secondary | ICD-10-CM | POA: Diagnosis not present

## 2020-07-09 DIAGNOSIS — K5900 Constipation, unspecified: Secondary | ICD-10-CM | POA: Diagnosis not present

## 2020-07-09 DIAGNOSIS — E039 Hypothyroidism, unspecified: Secondary | ICD-10-CM | POA: Diagnosis not present

## 2020-07-09 DIAGNOSIS — I10 Essential (primary) hypertension: Secondary | ICD-10-CM | POA: Diagnosis not present

## 2020-07-09 DIAGNOSIS — E669 Obesity, unspecified: Secondary | ICD-10-CM | POA: Diagnosis not present

## 2020-07-09 DIAGNOSIS — I160 Hypertensive urgency: Secondary | ICD-10-CM | POA: Diagnosis not present

## 2020-07-09 DIAGNOSIS — I442 Atrioventricular block, complete: Secondary | ICD-10-CM | POA: Diagnosis not present

## 2020-07-09 DIAGNOSIS — E1140 Type 2 diabetes mellitus with diabetic neuropathy, unspecified: Secondary | ICD-10-CM | POA: Diagnosis not present

## 2020-07-09 DIAGNOSIS — E1165 Type 2 diabetes mellitus with hyperglycemia: Secondary | ICD-10-CM | POA: Diagnosis not present

## 2020-07-09 DIAGNOSIS — R339 Retention of urine, unspecified: Secondary | ICD-10-CM | POA: Diagnosis not present

## 2020-07-09 DIAGNOSIS — Z20822 Contact with and (suspected) exposure to covid-19: Secondary | ICD-10-CM | POA: Diagnosis not present

## 2020-07-10 DIAGNOSIS — E1140 Type 2 diabetes mellitus with diabetic neuropathy, unspecified: Secondary | ICD-10-CM | POA: Diagnosis not present

## 2020-07-10 DIAGNOSIS — R109 Unspecified abdominal pain: Secondary | ICD-10-CM | POA: Diagnosis not present

## 2020-07-10 DIAGNOSIS — E1165 Type 2 diabetes mellitus with hyperglycemia: Secondary | ICD-10-CM | POA: Diagnosis not present

## 2020-07-10 DIAGNOSIS — K5900 Constipation, unspecified: Secondary | ICD-10-CM | POA: Diagnosis not present

## 2020-07-10 DIAGNOSIS — E039 Hypothyroidism, unspecified: Secondary | ICD-10-CM | POA: Diagnosis not present

## 2020-07-10 DIAGNOSIS — I442 Atrioventricular block, complete: Secondary | ICD-10-CM | POA: Diagnosis not present

## 2020-07-10 DIAGNOSIS — G8929 Other chronic pain: Secondary | ICD-10-CM | POA: Diagnosis not present

## 2020-07-10 DIAGNOSIS — R001 Bradycardia, unspecified: Secondary | ICD-10-CM | POA: Diagnosis not present

## 2020-07-10 DIAGNOSIS — I160 Hypertensive urgency: Secondary | ICD-10-CM | POA: Diagnosis not present

## 2020-07-10 DIAGNOSIS — I10 Essential (primary) hypertension: Secondary | ICD-10-CM | POA: Diagnosis not present

## 2020-07-10 DIAGNOSIS — R339 Retention of urine, unspecified: Secondary | ICD-10-CM | POA: Diagnosis not present

## 2020-07-10 DIAGNOSIS — Z20822 Contact with and (suspected) exposure to covid-19: Secondary | ICD-10-CM | POA: Diagnosis not present

## 2020-07-10 DIAGNOSIS — E669 Obesity, unspecified: Secondary | ICD-10-CM | POA: Diagnosis not present

## 2020-07-11 DIAGNOSIS — I160 Hypertensive urgency: Secondary | ICD-10-CM | POA: Diagnosis not present

## 2020-07-11 DIAGNOSIS — E1165 Type 2 diabetes mellitus with hyperglycemia: Secondary | ICD-10-CM | POA: Diagnosis not present

## 2020-07-11 DIAGNOSIS — E039 Hypothyroidism, unspecified: Secondary | ICD-10-CM | POA: Diagnosis not present

## 2020-07-11 DIAGNOSIS — Z20822 Contact with and (suspected) exposure to covid-19: Secondary | ICD-10-CM | POA: Diagnosis not present

## 2020-07-11 DIAGNOSIS — E669 Obesity, unspecified: Secondary | ICD-10-CM | POA: Diagnosis not present

## 2020-07-11 DIAGNOSIS — I442 Atrioventricular block, complete: Secondary | ICD-10-CM | POA: Diagnosis not present

## 2020-07-11 DIAGNOSIS — R001 Bradycardia, unspecified: Secondary | ICD-10-CM | POA: Diagnosis not present

## 2020-07-11 DIAGNOSIS — R339 Retention of urine, unspecified: Secondary | ICD-10-CM | POA: Diagnosis not present

## 2020-07-11 DIAGNOSIS — K5900 Constipation, unspecified: Secondary | ICD-10-CM | POA: Diagnosis not present

## 2020-07-11 DIAGNOSIS — E1140 Type 2 diabetes mellitus with diabetic neuropathy, unspecified: Secondary | ICD-10-CM | POA: Diagnosis not present

## 2020-07-11 DIAGNOSIS — G8929 Other chronic pain: Secondary | ICD-10-CM | POA: Diagnosis not present

## 2020-07-11 DIAGNOSIS — I10 Essential (primary) hypertension: Secondary | ICD-10-CM | POA: Diagnosis not present

## 2020-07-12 DIAGNOSIS — I10 Essential (primary) hypertension: Secondary | ICD-10-CM | POA: Diagnosis not present

## 2020-07-12 DIAGNOSIS — I442 Atrioventricular block, complete: Secondary | ICD-10-CM | POA: Diagnosis not present

## 2020-07-12 DIAGNOSIS — R001 Bradycardia, unspecified: Secondary | ICD-10-CM | POA: Diagnosis not present

## 2020-07-12 DIAGNOSIS — R109 Unspecified abdominal pain: Secondary | ICD-10-CM | POA: Diagnosis not present

## 2020-07-12 DIAGNOSIS — E669 Obesity, unspecified: Secondary | ICD-10-CM | POA: Diagnosis not present

## 2020-07-12 DIAGNOSIS — K5900 Constipation, unspecified: Secondary | ICD-10-CM | POA: Diagnosis not present

## 2020-07-12 DIAGNOSIS — E039 Hypothyroidism, unspecified: Secondary | ICD-10-CM | POA: Diagnosis not present

## 2020-07-12 DIAGNOSIS — G8929 Other chronic pain: Secondary | ICD-10-CM | POA: Diagnosis not present

## 2020-07-12 DIAGNOSIS — E1165 Type 2 diabetes mellitus with hyperglycemia: Secondary | ICD-10-CM | POA: Diagnosis not present

## 2020-07-12 DIAGNOSIS — R339 Retention of urine, unspecified: Secondary | ICD-10-CM | POA: Diagnosis not present

## 2020-07-12 DIAGNOSIS — E1140 Type 2 diabetes mellitus with diabetic neuropathy, unspecified: Secondary | ICD-10-CM | POA: Diagnosis not present

## 2020-07-12 DIAGNOSIS — Z20822 Contact with and (suspected) exposure to covid-19: Secondary | ICD-10-CM | POA: Diagnosis not present

## 2020-07-12 DIAGNOSIS — I160 Hypertensive urgency: Secondary | ICD-10-CM | POA: Diagnosis not present

## 2020-07-13 DIAGNOSIS — I10 Essential (primary) hypertension: Secondary | ICD-10-CM | POA: Diagnosis not present

## 2020-07-13 DIAGNOSIS — G8929 Other chronic pain: Secondary | ICD-10-CM | POA: Diagnosis not present

## 2020-07-13 DIAGNOSIS — R339 Retention of urine, unspecified: Secondary | ICD-10-CM | POA: Diagnosis not present

## 2020-07-13 DIAGNOSIS — R001 Bradycardia, unspecified: Secondary | ICD-10-CM | POA: Diagnosis not present

## 2020-07-13 DIAGNOSIS — E1165 Type 2 diabetes mellitus with hyperglycemia: Secondary | ICD-10-CM | POA: Diagnosis not present

## 2020-07-13 DIAGNOSIS — I442 Atrioventricular block, complete: Secondary | ICD-10-CM | POA: Diagnosis not present

## 2020-07-13 DIAGNOSIS — E039 Hypothyroidism, unspecified: Secondary | ICD-10-CM | POA: Diagnosis not present

## 2020-07-13 DIAGNOSIS — E669 Obesity, unspecified: Secondary | ICD-10-CM | POA: Diagnosis not present

## 2020-07-13 DIAGNOSIS — Z20822 Contact with and (suspected) exposure to covid-19: Secondary | ICD-10-CM | POA: Diagnosis not present

## 2020-07-13 DIAGNOSIS — E1140 Type 2 diabetes mellitus with diabetic neuropathy, unspecified: Secondary | ICD-10-CM | POA: Diagnosis not present

## 2020-07-14 DIAGNOSIS — G8929 Other chronic pain: Secondary | ICD-10-CM | POA: Diagnosis not present

## 2020-07-14 DIAGNOSIS — R001 Bradycardia, unspecified: Secondary | ICD-10-CM | POA: Diagnosis not present

## 2020-07-14 DIAGNOSIS — E1165 Type 2 diabetes mellitus with hyperglycemia: Secondary | ICD-10-CM | POA: Diagnosis not present

## 2020-07-14 DIAGNOSIS — I442 Atrioventricular block, complete: Secondary | ICD-10-CM | POA: Diagnosis not present

## 2020-07-14 DIAGNOSIS — R339 Retention of urine, unspecified: Secondary | ICD-10-CM | POA: Diagnosis not present

## 2020-07-14 DIAGNOSIS — I10 Essential (primary) hypertension: Secondary | ICD-10-CM | POA: Diagnosis not present

## 2020-07-14 DIAGNOSIS — E669 Obesity, unspecified: Secondary | ICD-10-CM | POA: Diagnosis not present

## 2020-07-14 DIAGNOSIS — Z20822 Contact with and (suspected) exposure to covid-19: Secondary | ICD-10-CM | POA: Diagnosis not present

## 2020-07-14 DIAGNOSIS — E039 Hypothyroidism, unspecified: Secondary | ICD-10-CM | POA: Diagnosis not present

## 2020-07-14 DIAGNOSIS — E1140 Type 2 diabetes mellitus with diabetic neuropathy, unspecified: Secondary | ICD-10-CM | POA: Diagnosis not present

## 2020-07-15 DIAGNOSIS — Z20822 Contact with and (suspected) exposure to covid-19: Secondary | ICD-10-CM | POA: Diagnosis not present

## 2020-07-15 DIAGNOSIS — R001 Bradycardia, unspecified: Secondary | ICD-10-CM | POA: Diagnosis not present

## 2020-07-15 DIAGNOSIS — I10 Essential (primary) hypertension: Secondary | ICD-10-CM | POA: Diagnosis not present

## 2020-07-15 DIAGNOSIS — R339 Retention of urine, unspecified: Secondary | ICD-10-CM | POA: Diagnosis not present

## 2020-07-15 DIAGNOSIS — E039 Hypothyroidism, unspecified: Secondary | ICD-10-CM | POA: Diagnosis not present

## 2020-07-15 DIAGNOSIS — G8929 Other chronic pain: Secondary | ICD-10-CM | POA: Diagnosis not present

## 2020-07-15 DIAGNOSIS — I442 Atrioventricular block, complete: Secondary | ICD-10-CM | POA: Diagnosis not present

## 2020-07-15 DIAGNOSIS — E1165 Type 2 diabetes mellitus with hyperglycemia: Secondary | ICD-10-CM | POA: Diagnosis not present

## 2020-07-15 DIAGNOSIS — E1140 Type 2 diabetes mellitus with diabetic neuropathy, unspecified: Secondary | ICD-10-CM | POA: Diagnosis not present

## 2020-07-15 DIAGNOSIS — E669 Obesity, unspecified: Secondary | ICD-10-CM | POA: Diagnosis not present

## 2020-07-16 DIAGNOSIS — R339 Retention of urine, unspecified: Secondary | ICD-10-CM | POA: Diagnosis not present

## 2020-07-16 DIAGNOSIS — G8929 Other chronic pain: Secondary | ICD-10-CM | POA: Diagnosis not present

## 2020-07-16 DIAGNOSIS — E039 Hypothyroidism, unspecified: Secondary | ICD-10-CM | POA: Diagnosis not present

## 2020-07-16 DIAGNOSIS — I10 Essential (primary) hypertension: Secondary | ICD-10-CM | POA: Diagnosis not present

## 2020-07-16 DIAGNOSIS — E1140 Type 2 diabetes mellitus with diabetic neuropathy, unspecified: Secondary | ICD-10-CM | POA: Diagnosis not present

## 2020-07-16 DIAGNOSIS — I442 Atrioventricular block, complete: Secondary | ICD-10-CM | POA: Diagnosis not present

## 2020-07-16 DIAGNOSIS — Z20822 Contact with and (suspected) exposure to covid-19: Secondary | ICD-10-CM | POA: Diagnosis not present

## 2020-07-16 DIAGNOSIS — E1165 Type 2 diabetes mellitus with hyperglycemia: Secondary | ICD-10-CM | POA: Diagnosis not present

## 2020-07-16 DIAGNOSIS — E669 Obesity, unspecified: Secondary | ICD-10-CM | POA: Diagnosis not present

## 2020-07-16 DIAGNOSIS — R001 Bradycardia, unspecified: Secondary | ICD-10-CM | POA: Diagnosis not present

## 2020-07-17 DIAGNOSIS — I10 Essential (primary) hypertension: Secondary | ICD-10-CM | POA: Diagnosis not present

## 2020-07-17 DIAGNOSIS — E1140 Type 2 diabetes mellitus with diabetic neuropathy, unspecified: Secondary | ICD-10-CM | POA: Diagnosis not present

## 2020-07-17 DIAGNOSIS — I442 Atrioventricular block, complete: Secondary | ICD-10-CM | POA: Diagnosis not present

## 2020-07-17 DIAGNOSIS — E039 Hypothyroidism, unspecified: Secondary | ICD-10-CM | POA: Diagnosis not present

## 2020-07-17 DIAGNOSIS — Z20822 Contact with and (suspected) exposure to covid-19: Secondary | ICD-10-CM | POA: Diagnosis not present

## 2020-07-17 DIAGNOSIS — R339 Retention of urine, unspecified: Secondary | ICD-10-CM | POA: Diagnosis not present

## 2020-07-17 DIAGNOSIS — E669 Obesity, unspecified: Secondary | ICD-10-CM | POA: Diagnosis not present

## 2020-07-17 DIAGNOSIS — E1165 Type 2 diabetes mellitus with hyperglycemia: Secondary | ICD-10-CM | POA: Diagnosis not present

## 2020-07-17 DIAGNOSIS — R001 Bradycardia, unspecified: Secondary | ICD-10-CM | POA: Diagnosis not present

## 2020-07-17 DIAGNOSIS — G8929 Other chronic pain: Secondary | ICD-10-CM | POA: Diagnosis not present

## 2020-07-18 DIAGNOSIS — R001 Bradycardia, unspecified: Secondary | ICD-10-CM | POA: Diagnosis not present

## 2020-07-18 DIAGNOSIS — E669 Obesity, unspecified: Secondary | ICD-10-CM | POA: Diagnosis not present

## 2020-07-18 DIAGNOSIS — E1140 Type 2 diabetes mellitus with diabetic neuropathy, unspecified: Secondary | ICD-10-CM | POA: Diagnosis not present

## 2020-07-18 DIAGNOSIS — E1165 Type 2 diabetes mellitus with hyperglycemia: Secondary | ICD-10-CM | POA: Diagnosis not present

## 2020-07-18 DIAGNOSIS — R339 Retention of urine, unspecified: Secondary | ICD-10-CM | POA: Diagnosis not present

## 2020-07-18 DIAGNOSIS — G8929 Other chronic pain: Secondary | ICD-10-CM | POA: Diagnosis not present

## 2020-07-18 DIAGNOSIS — E039 Hypothyroidism, unspecified: Secondary | ICD-10-CM | POA: Diagnosis not present

## 2020-07-18 DIAGNOSIS — I442 Atrioventricular block, complete: Secondary | ICD-10-CM | POA: Diagnosis not present

## 2020-07-18 DIAGNOSIS — Z20822 Contact with and (suspected) exposure to covid-19: Secondary | ICD-10-CM | POA: Diagnosis not present

## 2020-07-18 DIAGNOSIS — I10 Essential (primary) hypertension: Secondary | ICD-10-CM | POA: Diagnosis not present

## 2020-07-19 DIAGNOSIS — K5900 Constipation, unspecified: Secondary | ICD-10-CM | POA: Diagnosis not present

## 2020-07-19 DIAGNOSIS — E039 Hypothyroidism, unspecified: Secondary | ICD-10-CM | POA: Diagnosis not present

## 2020-07-19 DIAGNOSIS — R001 Bradycardia, unspecified: Secondary | ICD-10-CM | POA: Diagnosis not present

## 2020-07-19 DIAGNOSIS — Z20822 Contact with and (suspected) exposure to covid-19: Secondary | ICD-10-CM | POA: Diagnosis not present

## 2020-07-19 DIAGNOSIS — I10 Essential (primary) hypertension: Secondary | ICD-10-CM | POA: Diagnosis not present

## 2020-07-19 DIAGNOSIS — Z794 Long term (current) use of insulin: Secondary | ICD-10-CM | POA: Diagnosis not present

## 2020-07-19 DIAGNOSIS — I442 Atrioventricular block, complete: Secondary | ICD-10-CM | POA: Diagnosis not present

## 2020-07-19 DIAGNOSIS — E1165 Type 2 diabetes mellitus with hyperglycemia: Secondary | ICD-10-CM | POA: Diagnosis not present

## 2020-07-19 DIAGNOSIS — E669 Obesity, unspecified: Secondary | ICD-10-CM | POA: Diagnosis not present

## 2020-07-19 DIAGNOSIS — R339 Retention of urine, unspecified: Secondary | ICD-10-CM | POA: Diagnosis not present

## 2020-07-19 DIAGNOSIS — G8929 Other chronic pain: Secondary | ICD-10-CM | POA: Diagnosis not present

## 2020-07-19 DIAGNOSIS — E119 Type 2 diabetes mellitus without complications: Secondary | ICD-10-CM | POA: Diagnosis not present

## 2020-07-19 DIAGNOSIS — E1140 Type 2 diabetes mellitus with diabetic neuropathy, unspecified: Secondary | ICD-10-CM | POA: Diagnosis not present

## 2020-07-20 DIAGNOSIS — E1140 Type 2 diabetes mellitus with diabetic neuropathy, unspecified: Secondary | ICD-10-CM | POA: Diagnosis not present

## 2020-07-20 DIAGNOSIS — R339 Retention of urine, unspecified: Secondary | ICD-10-CM | POA: Diagnosis not present

## 2020-07-20 DIAGNOSIS — R001 Bradycardia, unspecified: Secondary | ICD-10-CM | POA: Diagnosis not present

## 2020-07-20 DIAGNOSIS — I442 Atrioventricular block, complete: Secondary | ICD-10-CM | POA: Diagnosis not present

## 2020-07-20 DIAGNOSIS — E1165 Type 2 diabetes mellitus with hyperglycemia: Secondary | ICD-10-CM | POA: Diagnosis not present

## 2020-07-20 DIAGNOSIS — G8929 Other chronic pain: Secondary | ICD-10-CM | POA: Diagnosis not present

## 2020-07-20 DIAGNOSIS — E669 Obesity, unspecified: Secondary | ICD-10-CM | POA: Diagnosis not present

## 2020-07-20 DIAGNOSIS — E039 Hypothyroidism, unspecified: Secondary | ICD-10-CM | POA: Diagnosis not present

## 2020-07-20 DIAGNOSIS — I872 Venous insufficiency (chronic) (peripheral): Secondary | ICD-10-CM | POA: Diagnosis not present

## 2020-07-20 DIAGNOSIS — Z20822 Contact with and (suspected) exposure to covid-19: Secondary | ICD-10-CM | POA: Diagnosis not present

## 2020-07-20 DIAGNOSIS — I10 Essential (primary) hypertension: Secondary | ICD-10-CM | POA: Diagnosis not present

## 2020-07-21 DIAGNOSIS — E039 Hypothyroidism, unspecified: Secondary | ICD-10-CM | POA: Diagnosis not present

## 2020-07-21 DIAGNOSIS — I442 Atrioventricular block, complete: Secondary | ICD-10-CM | POA: Diagnosis not present

## 2020-07-21 DIAGNOSIS — I872 Venous insufficiency (chronic) (peripheral): Secondary | ICD-10-CM | POA: Diagnosis not present

## 2020-07-21 DIAGNOSIS — R262 Difficulty in walking, not elsewhere classified: Secondary | ICD-10-CM | POA: Diagnosis not present

## 2020-07-21 DIAGNOSIS — R110 Nausea: Secondary | ICD-10-CM | POA: Diagnosis not present

## 2020-07-21 DIAGNOSIS — Z03818 Encounter for observation for suspected exposure to other biological agents ruled out: Secondary | ICD-10-CM | POA: Diagnosis not present

## 2020-07-21 DIAGNOSIS — E1165 Type 2 diabetes mellitus with hyperglycemia: Secondary | ICD-10-CM | POA: Diagnosis not present

## 2020-07-21 DIAGNOSIS — R531 Weakness: Secondary | ICD-10-CM | POA: Diagnosis not present

## 2020-07-21 DIAGNOSIS — M5489 Other dorsalgia: Secondary | ICD-10-CM | POA: Diagnosis not present

## 2020-07-21 DIAGNOSIS — Z45010 Encounter for checking and testing of cardiac pacemaker pulse generator [battery]: Secondary | ICD-10-CM | POA: Diagnosis not present

## 2020-07-21 DIAGNOSIS — M4800 Spinal stenosis, site unspecified: Secondary | ICD-10-CM | POA: Diagnosis not present

## 2020-07-21 DIAGNOSIS — Z20822 Contact with and (suspected) exposure to covid-19: Secondary | ICD-10-CM | POA: Diagnosis not present

## 2020-07-21 DIAGNOSIS — G8929 Other chronic pain: Secondary | ICD-10-CM | POA: Diagnosis not present

## 2020-07-21 DIAGNOSIS — G894 Chronic pain syndrome: Secondary | ICD-10-CM | POA: Diagnosis not present

## 2020-07-21 DIAGNOSIS — R001 Bradycardia, unspecified: Secondary | ICD-10-CM | POA: Diagnosis not present

## 2020-07-21 DIAGNOSIS — E669 Obesity, unspecified: Secondary | ICD-10-CM | POA: Diagnosis not present

## 2020-07-21 DIAGNOSIS — M6281 Muscle weakness (generalized): Secondary | ICD-10-CM | POA: Diagnosis not present

## 2020-07-21 DIAGNOSIS — Z950 Presence of cardiac pacemaker: Secondary | ICD-10-CM | POA: Diagnosis not present

## 2020-07-21 DIAGNOSIS — I10 Essential (primary) hypertension: Secondary | ICD-10-CM | POA: Diagnosis not present

## 2020-07-21 DIAGNOSIS — R339 Retention of urine, unspecified: Secondary | ICD-10-CM | POA: Diagnosis not present

## 2020-07-21 DIAGNOSIS — E1140 Type 2 diabetes mellitus with diabetic neuropathy, unspecified: Secondary | ICD-10-CM | POA: Diagnosis not present

## 2020-07-21 DIAGNOSIS — R2681 Unsteadiness on feet: Secondary | ICD-10-CM | POA: Diagnosis not present

## 2020-07-21 DIAGNOSIS — K5901 Slow transit constipation: Secondary | ICD-10-CM | POA: Diagnosis not present

## 2020-07-23 DIAGNOSIS — K5901 Slow transit constipation: Secondary | ICD-10-CM | POA: Diagnosis not present

## 2020-07-23 DIAGNOSIS — R531 Weakness: Secondary | ICD-10-CM | POA: Diagnosis not present

## 2020-07-23 DIAGNOSIS — I442 Atrioventricular block, complete: Secondary | ICD-10-CM | POA: Diagnosis not present

## 2020-07-23 DIAGNOSIS — I10 Essential (primary) hypertension: Secondary | ICD-10-CM | POA: Diagnosis not present

## 2020-07-26 DIAGNOSIS — G8929 Other chronic pain: Secondary | ICD-10-CM | POA: Diagnosis not present

## 2020-07-26 DIAGNOSIS — I442 Atrioventricular block, complete: Secondary | ICD-10-CM | POA: Diagnosis not present

## 2020-07-26 DIAGNOSIS — M4800 Spinal stenosis, site unspecified: Secondary | ICD-10-CM | POA: Diagnosis not present

## 2020-07-26 DIAGNOSIS — M5489 Other dorsalgia: Secondary | ICD-10-CM | POA: Diagnosis not present

## 2020-07-26 DIAGNOSIS — R531 Weakness: Secondary | ICD-10-CM | POA: Diagnosis not present

## 2020-07-30 DIAGNOSIS — I442 Atrioventricular block, complete: Secondary | ICD-10-CM | POA: Diagnosis not present

## 2020-07-30 DIAGNOSIS — R531 Weakness: Secondary | ICD-10-CM | POA: Diagnosis not present

## 2020-07-30 DIAGNOSIS — K5901 Slow transit constipation: Secondary | ICD-10-CM | POA: Diagnosis not present

## 2020-07-30 DIAGNOSIS — R110 Nausea: Secondary | ICD-10-CM | POA: Diagnosis not present

## 2020-08-03 DIAGNOSIS — I442 Atrioventricular block, complete: Secondary | ICD-10-CM | POA: Diagnosis not present

## 2020-08-03 DIAGNOSIS — Z20828 Contact with and (suspected) exposure to other viral communicable diseases: Secondary | ICD-10-CM | POA: Diagnosis not present

## 2020-08-04 DIAGNOSIS — I442 Atrioventricular block, complete: Secondary | ICD-10-CM | POA: Diagnosis not present

## 2020-08-05 DIAGNOSIS — R05 Cough: Secondary | ICD-10-CM | POA: Diagnosis not present

## 2020-08-05 DIAGNOSIS — I10 Essential (primary) hypertension: Secondary | ICD-10-CM | POA: Diagnosis not present

## 2020-08-05 DIAGNOSIS — I442 Atrioventricular block, complete: Secondary | ICD-10-CM | POA: Diagnosis not present

## 2020-08-06 DIAGNOSIS — I442 Atrioventricular block, complete: Secondary | ICD-10-CM | POA: Diagnosis not present

## 2020-08-08 DIAGNOSIS — I442 Atrioventricular block, complete: Secondary | ICD-10-CM | POA: Diagnosis not present

## 2020-08-09 DIAGNOSIS — I442 Atrioventricular block, complete: Secondary | ICD-10-CM | POA: Diagnosis not present

## 2020-08-10 DIAGNOSIS — I442 Atrioventricular block, complete: Secondary | ICD-10-CM | POA: Diagnosis not present

## 2020-08-11 DIAGNOSIS — Z950 Presence of cardiac pacemaker: Secondary | ICD-10-CM | POA: Diagnosis not present

## 2020-08-11 DIAGNOSIS — I442 Atrioventricular block, complete: Secondary | ICD-10-CM | POA: Diagnosis not present

## 2020-08-11 DIAGNOSIS — I10 Essential (primary) hypertension: Secondary | ICD-10-CM | POA: Diagnosis not present

## 2020-08-11 DIAGNOSIS — I272 Pulmonary hypertension, unspecified: Secondary | ICD-10-CM | POA: Diagnosis not present

## 2020-08-11 DIAGNOSIS — E1140 Type 2 diabetes mellitus with diabetic neuropathy, unspecified: Secondary | ICD-10-CM | POA: Diagnosis not present

## 2020-08-12 DIAGNOSIS — I442 Atrioventricular block, complete: Secondary | ICD-10-CM | POA: Diagnosis not present

## 2020-08-13 DIAGNOSIS — I442 Atrioventricular block, complete: Secondary | ICD-10-CM | POA: Diagnosis not present

## 2020-08-15 DIAGNOSIS — I442 Atrioventricular block, complete: Secondary | ICD-10-CM | POA: Diagnosis not present

## 2020-08-16 DIAGNOSIS — I442 Atrioventricular block, complete: Secondary | ICD-10-CM | POA: Diagnosis not present

## 2020-08-17 DIAGNOSIS — I442 Atrioventricular block, complete: Secondary | ICD-10-CM | POA: Diagnosis not present

## 2020-08-18 DIAGNOSIS — K5901 Slow transit constipation: Secondary | ICD-10-CM | POA: Diagnosis not present

## 2020-08-18 DIAGNOSIS — E038 Other specified hypothyroidism: Secondary | ICD-10-CM | POA: Diagnosis not present

## 2020-08-18 DIAGNOSIS — M1049 Other secondary gout, multiple sites: Secondary | ICD-10-CM | POA: Diagnosis not present

## 2020-08-18 DIAGNOSIS — I10 Essential (primary) hypertension: Secondary | ICD-10-CM | POA: Diagnosis not present

## 2020-08-18 DIAGNOSIS — E568 Deficiency of other vitamins: Secondary | ICD-10-CM | POA: Diagnosis not present

## 2020-08-18 DIAGNOSIS — R531 Weakness: Secondary | ICD-10-CM | POA: Diagnosis not present

## 2020-08-18 DIAGNOSIS — I442 Atrioventricular block, complete: Secondary | ICD-10-CM | POA: Diagnosis not present

## 2020-08-18 DIAGNOSIS — E7849 Other hyperlipidemia: Secondary | ICD-10-CM | POA: Diagnosis not present

## 2020-08-18 DIAGNOSIS — L0293 Carbuncle, unspecified: Secondary | ICD-10-CM | POA: Diagnosis not present

## 2020-08-23 DIAGNOSIS — M109 Gout, unspecified: Secondary | ICD-10-CM | POA: Diagnosis not present

## 2020-08-23 DIAGNOSIS — E1140 Type 2 diabetes mellitus with diabetic neuropathy, unspecified: Secondary | ICD-10-CM | POA: Diagnosis not present

## 2020-08-23 DIAGNOSIS — I442 Atrioventricular block, complete: Secondary | ICD-10-CM | POA: Diagnosis not present

## 2020-08-23 DIAGNOSIS — E1151 Type 2 diabetes mellitus with diabetic peripheral angiopathy without gangrene: Secondary | ICD-10-CM | POA: Diagnosis not present

## 2020-08-23 DIAGNOSIS — I10 Essential (primary) hypertension: Secondary | ICD-10-CM | POA: Diagnosis not present

## 2020-08-23 DIAGNOSIS — M4800 Spinal stenosis, site unspecified: Secondary | ICD-10-CM | POA: Diagnosis not present

## 2020-08-23 DIAGNOSIS — I872 Venous insufficiency (chronic) (peripheral): Secondary | ICD-10-CM | POA: Diagnosis not present

## 2020-08-23 DIAGNOSIS — G894 Chronic pain syndrome: Secondary | ICD-10-CM | POA: Diagnosis not present

## 2020-08-23 DIAGNOSIS — I2510 Atherosclerotic heart disease of native coronary artery without angina pectoris: Secondary | ICD-10-CM | POA: Diagnosis not present

## 2020-08-24 DIAGNOSIS — I442 Atrioventricular block, complete: Secondary | ICD-10-CM | POA: Diagnosis not present

## 2020-08-24 DIAGNOSIS — I2510 Atherosclerotic heart disease of native coronary artery without angina pectoris: Secondary | ICD-10-CM | POA: Diagnosis not present

## 2020-08-24 DIAGNOSIS — M109 Gout, unspecified: Secondary | ICD-10-CM | POA: Diagnosis not present

## 2020-08-24 DIAGNOSIS — G894 Chronic pain syndrome: Secondary | ICD-10-CM | POA: Diagnosis not present

## 2020-08-24 DIAGNOSIS — E1151 Type 2 diabetes mellitus with diabetic peripheral angiopathy without gangrene: Secondary | ICD-10-CM | POA: Diagnosis not present

## 2020-08-24 DIAGNOSIS — I10 Essential (primary) hypertension: Secondary | ICD-10-CM | POA: Diagnosis not present

## 2020-08-24 DIAGNOSIS — I872 Venous insufficiency (chronic) (peripheral): Secondary | ICD-10-CM | POA: Diagnosis not present

## 2020-08-24 DIAGNOSIS — E1140 Type 2 diabetes mellitus with diabetic neuropathy, unspecified: Secondary | ICD-10-CM | POA: Diagnosis not present

## 2020-08-24 DIAGNOSIS — M4800 Spinal stenosis, site unspecified: Secondary | ICD-10-CM | POA: Diagnosis not present

## 2020-08-26 DIAGNOSIS — I442 Atrioventricular block, complete: Secondary | ICD-10-CM | POA: Diagnosis not present

## 2020-08-26 DIAGNOSIS — I2510 Atherosclerotic heart disease of native coronary artery without angina pectoris: Secondary | ICD-10-CM | POA: Diagnosis not present

## 2020-08-26 DIAGNOSIS — M4800 Spinal stenosis, site unspecified: Secondary | ICD-10-CM | POA: Diagnosis not present

## 2020-08-26 DIAGNOSIS — M109 Gout, unspecified: Secondary | ICD-10-CM | POA: Diagnosis not present

## 2020-08-26 DIAGNOSIS — I10 Essential (primary) hypertension: Secondary | ICD-10-CM | POA: Diagnosis not present

## 2020-08-26 DIAGNOSIS — E1140 Type 2 diabetes mellitus with diabetic neuropathy, unspecified: Secondary | ICD-10-CM | POA: Diagnosis not present

## 2020-08-26 DIAGNOSIS — E1151 Type 2 diabetes mellitus with diabetic peripheral angiopathy without gangrene: Secondary | ICD-10-CM | POA: Diagnosis not present

## 2020-08-26 DIAGNOSIS — I872 Venous insufficiency (chronic) (peripheral): Secondary | ICD-10-CM | POA: Diagnosis not present

## 2020-08-26 DIAGNOSIS — G894 Chronic pain syndrome: Secondary | ICD-10-CM | POA: Diagnosis not present

## 2020-08-31 DIAGNOSIS — I10 Essential (primary) hypertension: Secondary | ICD-10-CM | POA: Diagnosis not present

## 2020-08-31 DIAGNOSIS — I2510 Atherosclerotic heart disease of native coronary artery without angina pectoris: Secondary | ICD-10-CM | POA: Diagnosis not present

## 2020-08-31 DIAGNOSIS — I442 Atrioventricular block, complete: Secondary | ICD-10-CM | POA: Diagnosis not present

## 2020-08-31 DIAGNOSIS — E1140 Type 2 diabetes mellitus with diabetic neuropathy, unspecified: Secondary | ICD-10-CM | POA: Diagnosis not present

## 2020-08-31 DIAGNOSIS — M4800 Spinal stenosis, site unspecified: Secondary | ICD-10-CM | POA: Diagnosis not present

## 2020-08-31 DIAGNOSIS — G894 Chronic pain syndrome: Secondary | ICD-10-CM | POA: Diagnosis not present

## 2020-08-31 DIAGNOSIS — I872 Venous insufficiency (chronic) (peripheral): Secondary | ICD-10-CM | POA: Diagnosis not present

## 2020-08-31 DIAGNOSIS — M109 Gout, unspecified: Secondary | ICD-10-CM | POA: Diagnosis not present

## 2020-08-31 DIAGNOSIS — E1151 Type 2 diabetes mellitus with diabetic peripheral angiopathy without gangrene: Secondary | ICD-10-CM | POA: Diagnosis not present

## 2020-09-01 DIAGNOSIS — E1140 Type 2 diabetes mellitus with diabetic neuropathy, unspecified: Secondary | ICD-10-CM | POA: Diagnosis not present

## 2020-09-01 DIAGNOSIS — I2510 Atherosclerotic heart disease of native coronary artery without angina pectoris: Secondary | ICD-10-CM | POA: Diagnosis not present

## 2020-09-01 DIAGNOSIS — I872 Venous insufficiency (chronic) (peripheral): Secondary | ICD-10-CM | POA: Diagnosis not present

## 2020-09-01 DIAGNOSIS — M4800 Spinal stenosis, site unspecified: Secondary | ICD-10-CM | POA: Diagnosis not present

## 2020-09-01 DIAGNOSIS — I10 Essential (primary) hypertension: Secondary | ICD-10-CM | POA: Diagnosis not present

## 2020-09-01 DIAGNOSIS — E1151 Type 2 diabetes mellitus with diabetic peripheral angiopathy without gangrene: Secondary | ICD-10-CM | POA: Diagnosis not present

## 2020-09-01 DIAGNOSIS — M109 Gout, unspecified: Secondary | ICD-10-CM | POA: Diagnosis not present

## 2020-09-01 DIAGNOSIS — I442 Atrioventricular block, complete: Secondary | ICD-10-CM | POA: Diagnosis not present

## 2020-09-01 DIAGNOSIS — G894 Chronic pain syndrome: Secondary | ICD-10-CM | POA: Diagnosis not present

## 2020-09-03 DIAGNOSIS — I442 Atrioventricular block, complete: Secondary | ICD-10-CM | POA: Diagnosis not present

## 2020-09-03 DIAGNOSIS — Z950 Presence of cardiac pacemaker: Secondary | ICD-10-CM | POA: Diagnosis not present

## 2020-09-03 DIAGNOSIS — E119 Type 2 diabetes mellitus without complications: Secondary | ICD-10-CM | POA: Diagnosis not present

## 2020-09-03 DIAGNOSIS — I10 Essential (primary) hypertension: Secondary | ICD-10-CM | POA: Diagnosis not present

## 2020-09-03 DIAGNOSIS — L600 Ingrowing nail: Secondary | ICD-10-CM | POA: Diagnosis not present

## 2020-09-03 DIAGNOSIS — Z79899 Other long term (current) drug therapy: Secondary | ICD-10-CM | POA: Diagnosis not present

## 2020-09-03 DIAGNOSIS — Z8739 Personal history of other diseases of the musculoskeletal system and connective tissue: Secondary | ICD-10-CM | POA: Diagnosis not present

## 2020-09-03 DIAGNOSIS — L02229 Furuncle of trunk, unspecified: Secondary | ICD-10-CM | POA: Diagnosis not present

## 2020-09-03 DIAGNOSIS — Z09 Encounter for follow-up examination after completed treatment for conditions other than malignant neoplasm: Secondary | ICD-10-CM | POA: Diagnosis not present

## 2020-09-06 DIAGNOSIS — E1151 Type 2 diabetes mellitus with diabetic peripheral angiopathy without gangrene: Secondary | ICD-10-CM | POA: Diagnosis not present

## 2020-09-06 DIAGNOSIS — L603 Nail dystrophy: Secondary | ICD-10-CM | POA: Diagnosis not present

## 2020-09-06 DIAGNOSIS — L84 Corns and callosities: Secondary | ICD-10-CM | POA: Diagnosis not present

## 2020-09-07 DIAGNOSIS — I10 Essential (primary) hypertension: Secondary | ICD-10-CM | POA: Diagnosis not present

## 2020-09-07 DIAGNOSIS — M109 Gout, unspecified: Secondary | ICD-10-CM | POA: Diagnosis not present

## 2020-09-07 DIAGNOSIS — M4800 Spinal stenosis, site unspecified: Secondary | ICD-10-CM | POA: Diagnosis not present

## 2020-09-07 DIAGNOSIS — I442 Atrioventricular block, complete: Secondary | ICD-10-CM | POA: Diagnosis not present

## 2020-09-07 DIAGNOSIS — E1140 Type 2 diabetes mellitus with diabetic neuropathy, unspecified: Secondary | ICD-10-CM | POA: Diagnosis not present

## 2020-09-07 DIAGNOSIS — I872 Venous insufficiency (chronic) (peripheral): Secondary | ICD-10-CM | POA: Diagnosis not present

## 2020-09-07 DIAGNOSIS — G894 Chronic pain syndrome: Secondary | ICD-10-CM | POA: Diagnosis not present

## 2020-09-07 DIAGNOSIS — E1151 Type 2 diabetes mellitus with diabetic peripheral angiopathy without gangrene: Secondary | ICD-10-CM | POA: Diagnosis not present

## 2020-09-07 DIAGNOSIS — I2510 Atherosclerotic heart disease of native coronary artery without angina pectoris: Secondary | ICD-10-CM | POA: Diagnosis not present

## 2020-09-08 DIAGNOSIS — E1151 Type 2 diabetes mellitus with diabetic peripheral angiopathy without gangrene: Secondary | ICD-10-CM | POA: Diagnosis not present

## 2020-09-08 DIAGNOSIS — E1140 Type 2 diabetes mellitus with diabetic neuropathy, unspecified: Secondary | ICD-10-CM | POA: Diagnosis not present

## 2020-09-08 DIAGNOSIS — M109 Gout, unspecified: Secondary | ICD-10-CM | POA: Diagnosis not present

## 2020-09-08 DIAGNOSIS — M4800 Spinal stenosis, site unspecified: Secondary | ICD-10-CM | POA: Diagnosis not present

## 2020-09-08 DIAGNOSIS — I442 Atrioventricular block, complete: Secondary | ICD-10-CM | POA: Diagnosis not present

## 2020-09-08 DIAGNOSIS — I872 Venous insufficiency (chronic) (peripheral): Secondary | ICD-10-CM | POA: Diagnosis not present

## 2020-09-08 DIAGNOSIS — I10 Essential (primary) hypertension: Secondary | ICD-10-CM | POA: Diagnosis not present

## 2020-09-08 DIAGNOSIS — I2510 Atherosclerotic heart disease of native coronary artery without angina pectoris: Secondary | ICD-10-CM | POA: Diagnosis not present

## 2020-09-08 DIAGNOSIS — G894 Chronic pain syndrome: Secondary | ICD-10-CM | POA: Diagnosis not present

## 2020-09-09 DIAGNOSIS — I10 Essential (primary) hypertension: Secondary | ICD-10-CM | POA: Diagnosis not present

## 2020-09-09 DIAGNOSIS — M109 Gout, unspecified: Secondary | ICD-10-CM | POA: Diagnosis not present

## 2020-09-09 DIAGNOSIS — M4800 Spinal stenosis, site unspecified: Secondary | ICD-10-CM | POA: Diagnosis not present

## 2020-09-09 DIAGNOSIS — I2510 Atherosclerotic heart disease of native coronary artery without angina pectoris: Secondary | ICD-10-CM | POA: Diagnosis not present

## 2020-09-09 DIAGNOSIS — E1140 Type 2 diabetes mellitus with diabetic neuropathy, unspecified: Secondary | ICD-10-CM | POA: Diagnosis not present

## 2020-09-09 DIAGNOSIS — I872 Venous insufficiency (chronic) (peripheral): Secondary | ICD-10-CM | POA: Diagnosis not present

## 2020-09-09 DIAGNOSIS — I442 Atrioventricular block, complete: Secondary | ICD-10-CM | POA: Diagnosis not present

## 2020-09-09 DIAGNOSIS — E1151 Type 2 diabetes mellitus with diabetic peripheral angiopathy without gangrene: Secondary | ICD-10-CM | POA: Diagnosis not present

## 2020-09-09 DIAGNOSIS — G894 Chronic pain syndrome: Secondary | ICD-10-CM | POA: Diagnosis not present

## 2020-09-13 DIAGNOSIS — H35033 Hypertensive retinopathy, bilateral: Secondary | ICD-10-CM | POA: Diagnosis not present

## 2020-09-13 DIAGNOSIS — Z961 Presence of intraocular lens: Secondary | ICD-10-CM | POA: Diagnosis not present

## 2020-09-13 DIAGNOSIS — E113393 Type 2 diabetes mellitus with moderate nonproliferative diabetic retinopathy without macular edema, bilateral: Secondary | ICD-10-CM | POA: Diagnosis not present

## 2020-09-14 DIAGNOSIS — I10 Essential (primary) hypertension: Secondary | ICD-10-CM | POA: Diagnosis not present

## 2020-09-14 DIAGNOSIS — M4800 Spinal stenosis, site unspecified: Secondary | ICD-10-CM | POA: Diagnosis not present

## 2020-09-14 DIAGNOSIS — E1151 Type 2 diabetes mellitus with diabetic peripheral angiopathy without gangrene: Secondary | ICD-10-CM | POA: Diagnosis not present

## 2020-09-14 DIAGNOSIS — I872 Venous insufficiency (chronic) (peripheral): Secondary | ICD-10-CM | POA: Diagnosis not present

## 2020-09-14 DIAGNOSIS — I442 Atrioventricular block, complete: Secondary | ICD-10-CM | POA: Diagnosis not present

## 2020-09-14 DIAGNOSIS — M109 Gout, unspecified: Secondary | ICD-10-CM | POA: Diagnosis not present

## 2020-09-14 DIAGNOSIS — I2510 Atherosclerotic heart disease of native coronary artery without angina pectoris: Secondary | ICD-10-CM | POA: Diagnosis not present

## 2020-09-14 DIAGNOSIS — G894 Chronic pain syndrome: Secondary | ICD-10-CM | POA: Diagnosis not present

## 2020-09-14 DIAGNOSIS — E1140 Type 2 diabetes mellitus with diabetic neuropathy, unspecified: Secondary | ICD-10-CM | POA: Diagnosis not present

## 2020-09-17 DIAGNOSIS — I10 Essential (primary) hypertension: Secondary | ICD-10-CM | POA: Diagnosis not present

## 2020-09-17 DIAGNOSIS — I872 Venous insufficiency (chronic) (peripheral): Secondary | ICD-10-CM | POA: Diagnosis not present

## 2020-09-17 DIAGNOSIS — G894 Chronic pain syndrome: Secondary | ICD-10-CM | POA: Diagnosis not present

## 2020-09-17 DIAGNOSIS — I442 Atrioventricular block, complete: Secondary | ICD-10-CM | POA: Diagnosis not present

## 2020-09-17 DIAGNOSIS — E1140 Type 2 diabetes mellitus with diabetic neuropathy, unspecified: Secondary | ICD-10-CM | POA: Diagnosis not present

## 2020-09-17 DIAGNOSIS — E1151 Type 2 diabetes mellitus with diabetic peripheral angiopathy without gangrene: Secondary | ICD-10-CM | POA: Diagnosis not present

## 2020-09-17 DIAGNOSIS — I2510 Atherosclerotic heart disease of native coronary artery without angina pectoris: Secondary | ICD-10-CM | POA: Diagnosis not present

## 2020-09-17 DIAGNOSIS — M4800 Spinal stenosis, site unspecified: Secondary | ICD-10-CM | POA: Diagnosis not present

## 2020-09-17 DIAGNOSIS — M109 Gout, unspecified: Secondary | ICD-10-CM | POA: Diagnosis not present

## 2020-09-18 DIAGNOSIS — I872 Venous insufficiency (chronic) (peripheral): Secondary | ICD-10-CM | POA: Diagnosis not present

## 2020-09-18 DIAGNOSIS — M109 Gout, unspecified: Secondary | ICD-10-CM | POA: Diagnosis not present

## 2020-09-18 DIAGNOSIS — E1151 Type 2 diabetes mellitus with diabetic peripheral angiopathy without gangrene: Secondary | ICD-10-CM | POA: Diagnosis not present

## 2020-09-18 DIAGNOSIS — I10 Essential (primary) hypertension: Secondary | ICD-10-CM | POA: Diagnosis not present

## 2020-09-18 DIAGNOSIS — I2510 Atherosclerotic heart disease of native coronary artery without angina pectoris: Secondary | ICD-10-CM | POA: Diagnosis not present

## 2020-09-18 DIAGNOSIS — E1140 Type 2 diabetes mellitus with diabetic neuropathy, unspecified: Secondary | ICD-10-CM | POA: Diagnosis not present

## 2020-09-18 DIAGNOSIS — G894 Chronic pain syndrome: Secondary | ICD-10-CM | POA: Diagnosis not present

## 2020-09-18 DIAGNOSIS — M4800 Spinal stenosis, site unspecified: Secondary | ICD-10-CM | POA: Diagnosis not present

## 2020-09-18 DIAGNOSIS — I442 Atrioventricular block, complete: Secondary | ICD-10-CM | POA: Diagnosis not present

## 2020-09-20 DIAGNOSIS — M4800 Spinal stenosis, site unspecified: Secondary | ICD-10-CM | POA: Diagnosis not present

## 2020-09-20 DIAGNOSIS — I442 Atrioventricular block, complete: Secondary | ICD-10-CM | POA: Diagnosis not present

## 2020-09-20 DIAGNOSIS — I872 Venous insufficiency (chronic) (peripheral): Secondary | ICD-10-CM | POA: Diagnosis not present

## 2020-09-20 DIAGNOSIS — E1140 Type 2 diabetes mellitus with diabetic neuropathy, unspecified: Secondary | ICD-10-CM | POA: Diagnosis not present

## 2020-09-20 DIAGNOSIS — M109 Gout, unspecified: Secondary | ICD-10-CM | POA: Diagnosis not present

## 2020-09-20 DIAGNOSIS — G894 Chronic pain syndrome: Secondary | ICD-10-CM | POA: Diagnosis not present

## 2020-09-20 DIAGNOSIS — I10 Essential (primary) hypertension: Secondary | ICD-10-CM | POA: Diagnosis not present

## 2020-09-20 DIAGNOSIS — I2510 Atherosclerotic heart disease of native coronary artery without angina pectoris: Secondary | ICD-10-CM | POA: Diagnosis not present

## 2020-09-20 DIAGNOSIS — E1151 Type 2 diabetes mellitus with diabetic peripheral angiopathy without gangrene: Secondary | ICD-10-CM | POA: Diagnosis not present

## 2020-09-22 DIAGNOSIS — I872 Venous insufficiency (chronic) (peripheral): Secondary | ICD-10-CM | POA: Diagnosis not present

## 2020-09-22 DIAGNOSIS — E1140 Type 2 diabetes mellitus with diabetic neuropathy, unspecified: Secondary | ICD-10-CM | POA: Diagnosis not present

## 2020-09-22 DIAGNOSIS — E1151 Type 2 diabetes mellitus with diabetic peripheral angiopathy without gangrene: Secondary | ICD-10-CM | POA: Diagnosis not present

## 2020-09-22 DIAGNOSIS — M4800 Spinal stenosis, site unspecified: Secondary | ICD-10-CM | POA: Diagnosis not present

## 2020-09-22 DIAGNOSIS — M109 Gout, unspecified: Secondary | ICD-10-CM | POA: Diagnosis not present

## 2020-09-22 DIAGNOSIS — I10 Essential (primary) hypertension: Secondary | ICD-10-CM | POA: Diagnosis not present

## 2020-09-22 DIAGNOSIS — I442 Atrioventricular block, complete: Secondary | ICD-10-CM | POA: Diagnosis not present

## 2020-09-22 DIAGNOSIS — G894 Chronic pain syndrome: Secondary | ICD-10-CM | POA: Diagnosis not present

## 2020-09-22 DIAGNOSIS — I2510 Atherosclerotic heart disease of native coronary artery without angina pectoris: Secondary | ICD-10-CM | POA: Diagnosis not present

## 2020-09-27 DIAGNOSIS — I2510 Atherosclerotic heart disease of native coronary artery without angina pectoris: Secondary | ICD-10-CM | POA: Diagnosis not present

## 2020-09-27 DIAGNOSIS — I442 Atrioventricular block, complete: Secondary | ICD-10-CM | POA: Diagnosis not present

## 2020-09-27 DIAGNOSIS — M4800 Spinal stenosis, site unspecified: Secondary | ICD-10-CM | POA: Diagnosis not present

## 2020-09-27 DIAGNOSIS — E1151 Type 2 diabetes mellitus with diabetic peripheral angiopathy without gangrene: Secondary | ICD-10-CM | POA: Diagnosis not present

## 2020-09-27 DIAGNOSIS — M109 Gout, unspecified: Secondary | ICD-10-CM | POA: Diagnosis not present

## 2020-09-27 DIAGNOSIS — E1140 Type 2 diabetes mellitus with diabetic neuropathy, unspecified: Secondary | ICD-10-CM | POA: Diagnosis not present

## 2020-09-27 DIAGNOSIS — I10 Essential (primary) hypertension: Secondary | ICD-10-CM | POA: Diagnosis not present

## 2020-09-27 DIAGNOSIS — G894 Chronic pain syndrome: Secondary | ICD-10-CM | POA: Diagnosis not present

## 2020-09-27 DIAGNOSIS — I872 Venous insufficiency (chronic) (peripheral): Secondary | ICD-10-CM | POA: Diagnosis not present

## 2020-09-29 DIAGNOSIS — I872 Venous insufficiency (chronic) (peripheral): Secondary | ICD-10-CM | POA: Diagnosis not present

## 2020-09-29 DIAGNOSIS — I10 Essential (primary) hypertension: Secondary | ICD-10-CM | POA: Diagnosis not present

## 2020-09-29 DIAGNOSIS — G894 Chronic pain syndrome: Secondary | ICD-10-CM | POA: Diagnosis not present

## 2020-09-29 DIAGNOSIS — I442 Atrioventricular block, complete: Secondary | ICD-10-CM | POA: Diagnosis not present

## 2020-09-29 DIAGNOSIS — M109 Gout, unspecified: Secondary | ICD-10-CM | POA: Diagnosis not present

## 2020-09-29 DIAGNOSIS — I2510 Atherosclerotic heart disease of native coronary artery without angina pectoris: Secondary | ICD-10-CM | POA: Diagnosis not present

## 2020-09-29 DIAGNOSIS — E1151 Type 2 diabetes mellitus with diabetic peripheral angiopathy without gangrene: Secondary | ICD-10-CM | POA: Diagnosis not present

## 2020-09-29 DIAGNOSIS — M4800 Spinal stenosis, site unspecified: Secondary | ICD-10-CM | POA: Diagnosis not present

## 2020-09-29 DIAGNOSIS — E1140 Type 2 diabetes mellitus with diabetic neuropathy, unspecified: Secondary | ICD-10-CM | POA: Diagnosis not present

## 2020-10-01 DIAGNOSIS — M109 Gout, unspecified: Secondary | ICD-10-CM | POA: Diagnosis not present

## 2020-10-01 DIAGNOSIS — M4800 Spinal stenosis, site unspecified: Secondary | ICD-10-CM | POA: Diagnosis not present

## 2020-10-01 DIAGNOSIS — E1140 Type 2 diabetes mellitus with diabetic neuropathy, unspecified: Secondary | ICD-10-CM | POA: Diagnosis not present

## 2020-10-01 DIAGNOSIS — I10 Essential (primary) hypertension: Secondary | ICD-10-CM | POA: Diagnosis not present

## 2020-10-01 DIAGNOSIS — I2510 Atherosclerotic heart disease of native coronary artery without angina pectoris: Secondary | ICD-10-CM | POA: Diagnosis not present

## 2020-10-01 DIAGNOSIS — I442 Atrioventricular block, complete: Secondary | ICD-10-CM | POA: Diagnosis not present

## 2020-10-01 DIAGNOSIS — G894 Chronic pain syndrome: Secondary | ICD-10-CM | POA: Diagnosis not present

## 2020-10-01 DIAGNOSIS — E1151 Type 2 diabetes mellitus with diabetic peripheral angiopathy without gangrene: Secondary | ICD-10-CM | POA: Diagnosis not present

## 2020-10-01 DIAGNOSIS — I872 Venous insufficiency (chronic) (peripheral): Secondary | ICD-10-CM | POA: Diagnosis not present

## 2020-10-04 DIAGNOSIS — M4800 Spinal stenosis, site unspecified: Secondary | ICD-10-CM | POA: Diagnosis not present

## 2020-10-04 DIAGNOSIS — I442 Atrioventricular block, complete: Secondary | ICD-10-CM | POA: Diagnosis not present

## 2020-10-04 DIAGNOSIS — G894 Chronic pain syndrome: Secondary | ICD-10-CM | POA: Diagnosis not present

## 2020-10-04 DIAGNOSIS — I872 Venous insufficiency (chronic) (peripheral): Secondary | ICD-10-CM | POA: Diagnosis not present

## 2020-10-04 DIAGNOSIS — M109 Gout, unspecified: Secondary | ICD-10-CM | POA: Diagnosis not present

## 2020-10-04 DIAGNOSIS — I10 Essential (primary) hypertension: Secondary | ICD-10-CM | POA: Diagnosis not present

## 2020-10-04 DIAGNOSIS — E1140 Type 2 diabetes mellitus with diabetic neuropathy, unspecified: Secondary | ICD-10-CM | POA: Diagnosis not present

## 2020-10-04 DIAGNOSIS — E1151 Type 2 diabetes mellitus with diabetic peripheral angiopathy without gangrene: Secondary | ICD-10-CM | POA: Diagnosis not present

## 2020-10-04 DIAGNOSIS — I2510 Atherosclerotic heart disease of native coronary artery without angina pectoris: Secondary | ICD-10-CM | POA: Diagnosis not present

## 2020-10-06 DIAGNOSIS — G894 Chronic pain syndrome: Secondary | ICD-10-CM | POA: Diagnosis not present

## 2020-10-06 DIAGNOSIS — I872 Venous insufficiency (chronic) (peripheral): Secondary | ICD-10-CM | POA: Diagnosis not present

## 2020-10-06 DIAGNOSIS — I10 Essential (primary) hypertension: Secondary | ICD-10-CM | POA: Diagnosis not present

## 2020-10-06 DIAGNOSIS — M4800 Spinal stenosis, site unspecified: Secondary | ICD-10-CM | POA: Diagnosis not present

## 2020-10-06 DIAGNOSIS — I2510 Atherosclerotic heart disease of native coronary artery without angina pectoris: Secondary | ICD-10-CM | POA: Diagnosis not present

## 2020-10-06 DIAGNOSIS — E1151 Type 2 diabetes mellitus with diabetic peripheral angiopathy without gangrene: Secondary | ICD-10-CM | POA: Diagnosis not present

## 2020-10-06 DIAGNOSIS — E1140 Type 2 diabetes mellitus with diabetic neuropathy, unspecified: Secondary | ICD-10-CM | POA: Diagnosis not present

## 2020-10-06 DIAGNOSIS — M109 Gout, unspecified: Secondary | ICD-10-CM | POA: Diagnosis not present

## 2020-10-06 DIAGNOSIS — I442 Atrioventricular block, complete: Secondary | ICD-10-CM | POA: Diagnosis not present

## 2020-10-12 DIAGNOSIS — E1140 Type 2 diabetes mellitus with diabetic neuropathy, unspecified: Secondary | ICD-10-CM | POA: Diagnosis not present

## 2020-10-12 DIAGNOSIS — G894 Chronic pain syndrome: Secondary | ICD-10-CM | POA: Diagnosis not present

## 2020-10-12 DIAGNOSIS — I442 Atrioventricular block, complete: Secondary | ICD-10-CM | POA: Diagnosis not present

## 2020-10-12 DIAGNOSIS — E1151 Type 2 diabetes mellitus with diabetic peripheral angiopathy without gangrene: Secondary | ICD-10-CM | POA: Diagnosis not present

## 2020-10-12 DIAGNOSIS — M109 Gout, unspecified: Secondary | ICD-10-CM | POA: Diagnosis not present

## 2020-10-12 DIAGNOSIS — I10 Essential (primary) hypertension: Secondary | ICD-10-CM | POA: Diagnosis not present

## 2020-10-12 DIAGNOSIS — I2510 Atherosclerotic heart disease of native coronary artery without angina pectoris: Secondary | ICD-10-CM | POA: Diagnosis not present

## 2020-10-12 DIAGNOSIS — I872 Venous insufficiency (chronic) (peripheral): Secondary | ICD-10-CM | POA: Diagnosis not present

## 2020-10-12 DIAGNOSIS — M4800 Spinal stenosis, site unspecified: Secondary | ICD-10-CM | POA: Diagnosis not present

## 2020-10-13 DIAGNOSIS — E1140 Type 2 diabetes mellitus with diabetic neuropathy, unspecified: Secondary | ICD-10-CM | POA: Diagnosis not present

## 2020-10-13 DIAGNOSIS — E1151 Type 2 diabetes mellitus with diabetic peripheral angiopathy without gangrene: Secondary | ICD-10-CM | POA: Diagnosis not present

## 2020-10-13 DIAGNOSIS — I872 Venous insufficiency (chronic) (peripheral): Secondary | ICD-10-CM | POA: Diagnosis not present

## 2020-10-13 DIAGNOSIS — M4800 Spinal stenosis, site unspecified: Secondary | ICD-10-CM | POA: Diagnosis not present

## 2020-10-13 DIAGNOSIS — I2510 Atherosclerotic heart disease of native coronary artery without angina pectoris: Secondary | ICD-10-CM | POA: Diagnosis not present

## 2020-10-13 DIAGNOSIS — M109 Gout, unspecified: Secondary | ICD-10-CM | POA: Diagnosis not present

## 2020-10-13 DIAGNOSIS — I10 Essential (primary) hypertension: Secondary | ICD-10-CM | POA: Diagnosis not present

## 2020-10-13 DIAGNOSIS — I442 Atrioventricular block, complete: Secondary | ICD-10-CM | POA: Diagnosis not present

## 2020-10-13 DIAGNOSIS — G894 Chronic pain syndrome: Secondary | ICD-10-CM | POA: Diagnosis not present

## 2020-10-14 DIAGNOSIS — G894 Chronic pain syndrome: Secondary | ICD-10-CM | POA: Diagnosis not present

## 2020-10-14 DIAGNOSIS — E1140 Type 2 diabetes mellitus with diabetic neuropathy, unspecified: Secondary | ICD-10-CM | POA: Diagnosis not present

## 2020-10-14 DIAGNOSIS — I872 Venous insufficiency (chronic) (peripheral): Secondary | ICD-10-CM | POA: Diagnosis not present

## 2020-10-14 DIAGNOSIS — M109 Gout, unspecified: Secondary | ICD-10-CM | POA: Diagnosis not present

## 2020-10-14 DIAGNOSIS — M4800 Spinal stenosis, site unspecified: Secondary | ICD-10-CM | POA: Diagnosis not present

## 2020-10-14 DIAGNOSIS — I442 Atrioventricular block, complete: Secondary | ICD-10-CM | POA: Diagnosis not present

## 2020-10-14 DIAGNOSIS — I10 Essential (primary) hypertension: Secondary | ICD-10-CM | POA: Diagnosis not present

## 2020-10-14 DIAGNOSIS — E1151 Type 2 diabetes mellitus with diabetic peripheral angiopathy without gangrene: Secondary | ICD-10-CM | POA: Diagnosis not present

## 2020-10-14 DIAGNOSIS — I2510 Atherosclerotic heart disease of native coronary artery without angina pectoris: Secondary | ICD-10-CM | POA: Diagnosis not present

## 2020-10-18 DIAGNOSIS — I442 Atrioventricular block, complete: Secondary | ICD-10-CM | POA: Diagnosis not present

## 2020-10-18 DIAGNOSIS — I872 Venous insufficiency (chronic) (peripheral): Secondary | ICD-10-CM | POA: Diagnosis not present

## 2020-10-18 DIAGNOSIS — E1151 Type 2 diabetes mellitus with diabetic peripheral angiopathy without gangrene: Secondary | ICD-10-CM | POA: Diagnosis not present

## 2020-10-18 DIAGNOSIS — I2510 Atherosclerotic heart disease of native coronary artery without angina pectoris: Secondary | ICD-10-CM | POA: Diagnosis not present

## 2020-10-18 DIAGNOSIS — M109 Gout, unspecified: Secondary | ICD-10-CM | POA: Diagnosis not present

## 2020-10-18 DIAGNOSIS — I10 Essential (primary) hypertension: Secondary | ICD-10-CM | POA: Diagnosis not present

## 2020-10-18 DIAGNOSIS — E1140 Type 2 diabetes mellitus with diabetic neuropathy, unspecified: Secondary | ICD-10-CM | POA: Diagnosis not present

## 2020-10-18 DIAGNOSIS — M4800 Spinal stenosis, site unspecified: Secondary | ICD-10-CM | POA: Diagnosis not present

## 2020-10-18 DIAGNOSIS — G894 Chronic pain syndrome: Secondary | ICD-10-CM | POA: Diagnosis not present

## 2020-10-19 DIAGNOSIS — G894 Chronic pain syndrome: Secondary | ICD-10-CM | POA: Diagnosis not present

## 2020-10-19 DIAGNOSIS — I872 Venous insufficiency (chronic) (peripheral): Secondary | ICD-10-CM | POA: Diagnosis not present

## 2020-10-19 DIAGNOSIS — M4800 Spinal stenosis, site unspecified: Secondary | ICD-10-CM | POA: Diagnosis not present

## 2020-10-19 DIAGNOSIS — E1140 Type 2 diabetes mellitus with diabetic neuropathy, unspecified: Secondary | ICD-10-CM | POA: Diagnosis not present

## 2020-10-19 DIAGNOSIS — I442 Atrioventricular block, complete: Secondary | ICD-10-CM | POA: Diagnosis not present

## 2020-10-19 DIAGNOSIS — M109 Gout, unspecified: Secondary | ICD-10-CM | POA: Diagnosis not present

## 2020-10-19 DIAGNOSIS — I10 Essential (primary) hypertension: Secondary | ICD-10-CM | POA: Diagnosis not present

## 2020-10-19 DIAGNOSIS — E1151 Type 2 diabetes mellitus with diabetic peripheral angiopathy without gangrene: Secondary | ICD-10-CM | POA: Diagnosis not present

## 2020-10-19 DIAGNOSIS — I2510 Atherosclerotic heart disease of native coronary artery without angina pectoris: Secondary | ICD-10-CM | POA: Diagnosis not present

## 2020-10-20 DIAGNOSIS — I10 Essential (primary) hypertension: Secondary | ICD-10-CM | POA: Diagnosis not present

## 2020-10-20 DIAGNOSIS — E1140 Type 2 diabetes mellitus with diabetic neuropathy, unspecified: Secondary | ICD-10-CM | POA: Diagnosis not present

## 2020-10-20 DIAGNOSIS — I442 Atrioventricular block, complete: Secondary | ICD-10-CM | POA: Diagnosis not present

## 2020-10-20 DIAGNOSIS — M4800 Spinal stenosis, site unspecified: Secondary | ICD-10-CM | POA: Diagnosis not present

## 2020-10-20 DIAGNOSIS — E1151 Type 2 diabetes mellitus with diabetic peripheral angiopathy without gangrene: Secondary | ICD-10-CM | POA: Diagnosis not present

## 2020-10-20 DIAGNOSIS — G894 Chronic pain syndrome: Secondary | ICD-10-CM | POA: Diagnosis not present

## 2020-10-20 DIAGNOSIS — I872 Venous insufficiency (chronic) (peripheral): Secondary | ICD-10-CM | POA: Diagnosis not present

## 2020-10-20 DIAGNOSIS — M109 Gout, unspecified: Secondary | ICD-10-CM | POA: Diagnosis not present

## 2020-10-20 DIAGNOSIS — I2510 Atherosclerotic heart disease of native coronary artery without angina pectoris: Secondary | ICD-10-CM | POA: Diagnosis not present

## 2020-10-22 DIAGNOSIS — E1140 Type 2 diabetes mellitus with diabetic neuropathy, unspecified: Secondary | ICD-10-CM | POA: Diagnosis not present

## 2020-10-22 DIAGNOSIS — M4800 Spinal stenosis, site unspecified: Secondary | ICD-10-CM | POA: Diagnosis not present

## 2020-10-22 DIAGNOSIS — G894 Chronic pain syndrome: Secondary | ICD-10-CM | POA: Diagnosis not present

## 2020-10-22 DIAGNOSIS — E1151 Type 2 diabetes mellitus with diabetic peripheral angiopathy without gangrene: Secondary | ICD-10-CM | POA: Diagnosis not present

## 2020-10-22 DIAGNOSIS — I10 Essential (primary) hypertension: Secondary | ICD-10-CM | POA: Diagnosis not present

## 2020-10-22 DIAGNOSIS — I2510 Atherosclerotic heart disease of native coronary artery without angina pectoris: Secondary | ICD-10-CM | POA: Diagnosis not present

## 2020-10-22 DIAGNOSIS — I872 Venous insufficiency (chronic) (peripheral): Secondary | ICD-10-CM | POA: Diagnosis not present

## 2020-10-22 DIAGNOSIS — M109 Gout, unspecified: Secondary | ICD-10-CM | POA: Diagnosis not present

## 2020-10-22 DIAGNOSIS — I442 Atrioventricular block, complete: Secondary | ICD-10-CM | POA: Diagnosis not present

## 2020-10-27 DIAGNOSIS — M109 Gout, unspecified: Secondary | ICD-10-CM | POA: Diagnosis not present

## 2020-10-27 DIAGNOSIS — G894 Chronic pain syndrome: Secondary | ICD-10-CM | POA: Diagnosis not present

## 2020-10-27 DIAGNOSIS — I2510 Atherosclerotic heart disease of native coronary artery without angina pectoris: Secondary | ICD-10-CM | POA: Diagnosis not present

## 2020-10-27 DIAGNOSIS — I10 Essential (primary) hypertension: Secondary | ICD-10-CM | POA: Diagnosis not present

## 2020-10-27 DIAGNOSIS — I872 Venous insufficiency (chronic) (peripheral): Secondary | ICD-10-CM | POA: Diagnosis not present

## 2020-10-27 DIAGNOSIS — E1140 Type 2 diabetes mellitus with diabetic neuropathy, unspecified: Secondary | ICD-10-CM | POA: Diagnosis not present

## 2020-10-27 DIAGNOSIS — I442 Atrioventricular block, complete: Secondary | ICD-10-CM | POA: Diagnosis not present

## 2020-10-27 DIAGNOSIS — E1151 Type 2 diabetes mellitus with diabetic peripheral angiopathy without gangrene: Secondary | ICD-10-CM | POA: Diagnosis not present

## 2020-10-27 DIAGNOSIS — M4800 Spinal stenosis, site unspecified: Secondary | ICD-10-CM | POA: Diagnosis not present

## 2020-11-01 DIAGNOSIS — I2510 Atherosclerotic heart disease of native coronary artery without angina pectoris: Secondary | ICD-10-CM | POA: Diagnosis not present

## 2020-11-01 DIAGNOSIS — I442 Atrioventricular block, complete: Secondary | ICD-10-CM | POA: Diagnosis not present

## 2020-11-01 DIAGNOSIS — M109 Gout, unspecified: Secondary | ICD-10-CM | POA: Diagnosis not present

## 2020-11-01 DIAGNOSIS — E1140 Type 2 diabetes mellitus with diabetic neuropathy, unspecified: Secondary | ICD-10-CM | POA: Diagnosis not present

## 2020-11-01 DIAGNOSIS — I10 Essential (primary) hypertension: Secondary | ICD-10-CM | POA: Diagnosis not present

## 2020-11-01 DIAGNOSIS — E1151 Type 2 diabetes mellitus with diabetic peripheral angiopathy without gangrene: Secondary | ICD-10-CM | POA: Diagnosis not present

## 2020-11-01 DIAGNOSIS — M4800 Spinal stenosis, site unspecified: Secondary | ICD-10-CM | POA: Diagnosis not present

## 2020-11-01 DIAGNOSIS — G894 Chronic pain syndrome: Secondary | ICD-10-CM | POA: Diagnosis not present

## 2020-11-01 DIAGNOSIS — I872 Venous insufficiency (chronic) (peripheral): Secondary | ICD-10-CM | POA: Diagnosis not present

## 2020-11-10 DIAGNOSIS — I872 Venous insufficiency (chronic) (peripheral): Secondary | ICD-10-CM | POA: Diagnosis not present

## 2020-11-10 DIAGNOSIS — I442 Atrioventricular block, complete: Secondary | ICD-10-CM | POA: Diagnosis not present

## 2020-11-10 DIAGNOSIS — E1151 Type 2 diabetes mellitus with diabetic peripheral angiopathy without gangrene: Secondary | ICD-10-CM | POA: Diagnosis not present

## 2020-11-10 DIAGNOSIS — M4800 Spinal stenosis, site unspecified: Secondary | ICD-10-CM | POA: Diagnosis not present

## 2020-11-10 DIAGNOSIS — I10 Essential (primary) hypertension: Secondary | ICD-10-CM | POA: Diagnosis not present

## 2020-11-10 DIAGNOSIS — M109 Gout, unspecified: Secondary | ICD-10-CM | POA: Diagnosis not present

## 2020-11-10 DIAGNOSIS — I2510 Atherosclerotic heart disease of native coronary artery without angina pectoris: Secondary | ICD-10-CM | POA: Diagnosis not present

## 2020-11-10 DIAGNOSIS — G894 Chronic pain syndrome: Secondary | ICD-10-CM | POA: Diagnosis not present

## 2020-11-10 DIAGNOSIS — E1140 Type 2 diabetes mellitus with diabetic neuropathy, unspecified: Secondary | ICD-10-CM | POA: Diagnosis not present

## 2020-11-13 ENCOUNTER — Other Ambulatory Visit: Payer: Self-pay | Admitting: Cardiovascular Disease

## 2020-11-15 DIAGNOSIS — I2510 Atherosclerotic heart disease of native coronary artery without angina pectoris: Secondary | ICD-10-CM | POA: Diagnosis not present

## 2020-11-15 DIAGNOSIS — I442 Atrioventricular block, complete: Secondary | ICD-10-CM | POA: Diagnosis not present

## 2020-11-15 DIAGNOSIS — I10 Essential (primary) hypertension: Secondary | ICD-10-CM | POA: Diagnosis not present

## 2020-11-15 DIAGNOSIS — M109 Gout, unspecified: Secondary | ICD-10-CM | POA: Diagnosis not present

## 2020-11-15 DIAGNOSIS — E1140 Type 2 diabetes mellitus with diabetic neuropathy, unspecified: Secondary | ICD-10-CM | POA: Diagnosis not present

## 2020-11-15 DIAGNOSIS — G894 Chronic pain syndrome: Secondary | ICD-10-CM | POA: Diagnosis not present

## 2020-11-15 DIAGNOSIS — M4800 Spinal stenosis, site unspecified: Secondary | ICD-10-CM | POA: Diagnosis not present

## 2020-11-15 DIAGNOSIS — I872 Venous insufficiency (chronic) (peripheral): Secondary | ICD-10-CM | POA: Diagnosis not present

## 2020-11-15 DIAGNOSIS — E1151 Type 2 diabetes mellitus with diabetic peripheral angiopathy without gangrene: Secondary | ICD-10-CM | POA: Diagnosis not present

## 2020-11-21 DIAGNOSIS — I2510 Atherosclerotic heart disease of native coronary artery without angina pectoris: Secondary | ICD-10-CM | POA: Diagnosis not present

## 2020-11-21 DIAGNOSIS — I872 Venous insufficiency (chronic) (peripheral): Secondary | ICD-10-CM | POA: Diagnosis not present

## 2020-11-21 DIAGNOSIS — E1140 Type 2 diabetes mellitus with diabetic neuropathy, unspecified: Secondary | ICD-10-CM | POA: Diagnosis not present

## 2020-11-21 DIAGNOSIS — G894 Chronic pain syndrome: Secondary | ICD-10-CM | POA: Diagnosis not present

## 2020-11-21 DIAGNOSIS — I10 Essential (primary) hypertension: Secondary | ICD-10-CM | POA: Diagnosis not present

## 2020-11-21 DIAGNOSIS — I442 Atrioventricular block, complete: Secondary | ICD-10-CM | POA: Diagnosis not present

## 2020-11-21 DIAGNOSIS — E1151 Type 2 diabetes mellitus with diabetic peripheral angiopathy without gangrene: Secondary | ICD-10-CM | POA: Diagnosis not present

## 2020-11-21 DIAGNOSIS — M4800 Spinal stenosis, site unspecified: Secondary | ICD-10-CM | POA: Diagnosis not present

## 2020-11-21 DIAGNOSIS — M109 Gout, unspecified: Secondary | ICD-10-CM | POA: Diagnosis not present

## 2020-11-25 DIAGNOSIS — M4800 Spinal stenosis, site unspecified: Secondary | ICD-10-CM | POA: Diagnosis not present

## 2020-11-25 DIAGNOSIS — E1140 Type 2 diabetes mellitus with diabetic neuropathy, unspecified: Secondary | ICD-10-CM | POA: Diagnosis not present

## 2020-11-25 DIAGNOSIS — M109 Gout, unspecified: Secondary | ICD-10-CM | POA: Diagnosis not present

## 2020-11-25 DIAGNOSIS — I872 Venous insufficiency (chronic) (peripheral): Secondary | ICD-10-CM | POA: Diagnosis not present

## 2020-11-25 DIAGNOSIS — G894 Chronic pain syndrome: Secondary | ICD-10-CM | POA: Diagnosis not present

## 2020-11-25 DIAGNOSIS — I442 Atrioventricular block, complete: Secondary | ICD-10-CM | POA: Diagnosis not present

## 2020-11-25 DIAGNOSIS — I10 Essential (primary) hypertension: Secondary | ICD-10-CM | POA: Diagnosis not present

## 2020-11-25 DIAGNOSIS — I2510 Atherosclerotic heart disease of native coronary artery without angina pectoris: Secondary | ICD-10-CM | POA: Diagnosis not present

## 2020-11-25 DIAGNOSIS — E1151 Type 2 diabetes mellitus with diabetic peripheral angiopathy without gangrene: Secondary | ICD-10-CM | POA: Diagnosis not present

## 2020-12-01 DIAGNOSIS — I442 Atrioventricular block, complete: Secondary | ICD-10-CM | POA: Diagnosis not present

## 2020-12-01 DIAGNOSIS — M109 Gout, unspecified: Secondary | ICD-10-CM | POA: Diagnosis not present

## 2020-12-01 DIAGNOSIS — G894 Chronic pain syndrome: Secondary | ICD-10-CM | POA: Diagnosis not present

## 2020-12-01 DIAGNOSIS — E1151 Type 2 diabetes mellitus with diabetic peripheral angiopathy without gangrene: Secondary | ICD-10-CM | POA: Diagnosis not present

## 2020-12-01 DIAGNOSIS — I2510 Atherosclerotic heart disease of native coronary artery without angina pectoris: Secondary | ICD-10-CM | POA: Diagnosis not present

## 2020-12-01 DIAGNOSIS — M4800 Spinal stenosis, site unspecified: Secondary | ICD-10-CM | POA: Diagnosis not present

## 2020-12-01 DIAGNOSIS — I10 Essential (primary) hypertension: Secondary | ICD-10-CM | POA: Diagnosis not present

## 2020-12-01 DIAGNOSIS — E1140 Type 2 diabetes mellitus with diabetic neuropathy, unspecified: Secondary | ICD-10-CM | POA: Diagnosis not present

## 2020-12-01 DIAGNOSIS — I872 Venous insufficiency (chronic) (peripheral): Secondary | ICD-10-CM | POA: Diagnosis not present

## 2020-12-06 DIAGNOSIS — G894 Chronic pain syndrome: Secondary | ICD-10-CM | POA: Diagnosis not present

## 2020-12-06 DIAGNOSIS — I442 Atrioventricular block, complete: Secondary | ICD-10-CM | POA: Diagnosis not present

## 2020-12-06 DIAGNOSIS — I2510 Atherosclerotic heart disease of native coronary artery without angina pectoris: Secondary | ICD-10-CM | POA: Diagnosis not present

## 2020-12-06 DIAGNOSIS — M4800 Spinal stenosis, site unspecified: Secondary | ICD-10-CM | POA: Diagnosis not present

## 2020-12-06 DIAGNOSIS — E1140 Type 2 diabetes mellitus with diabetic neuropathy, unspecified: Secondary | ICD-10-CM | POA: Diagnosis not present

## 2020-12-06 DIAGNOSIS — I872 Venous insufficiency (chronic) (peripheral): Secondary | ICD-10-CM | POA: Diagnosis not present

## 2020-12-06 DIAGNOSIS — E1151 Type 2 diabetes mellitus with diabetic peripheral angiopathy without gangrene: Secondary | ICD-10-CM | POA: Diagnosis not present

## 2020-12-06 DIAGNOSIS — M109 Gout, unspecified: Secondary | ICD-10-CM | POA: Diagnosis not present

## 2020-12-06 DIAGNOSIS — I10 Essential (primary) hypertension: Secondary | ICD-10-CM | POA: Diagnosis not present

## 2020-12-13 DIAGNOSIS — M4800 Spinal stenosis, site unspecified: Secondary | ICD-10-CM | POA: Diagnosis not present

## 2020-12-13 DIAGNOSIS — I442 Atrioventricular block, complete: Secondary | ICD-10-CM | POA: Diagnosis not present

## 2020-12-13 DIAGNOSIS — E1151 Type 2 diabetes mellitus with diabetic peripheral angiopathy without gangrene: Secondary | ICD-10-CM | POA: Diagnosis not present

## 2020-12-13 DIAGNOSIS — I2510 Atherosclerotic heart disease of native coronary artery without angina pectoris: Secondary | ICD-10-CM | POA: Diagnosis not present

## 2020-12-13 DIAGNOSIS — G894 Chronic pain syndrome: Secondary | ICD-10-CM | POA: Diagnosis not present

## 2020-12-13 DIAGNOSIS — I10 Essential (primary) hypertension: Secondary | ICD-10-CM | POA: Diagnosis not present

## 2020-12-13 DIAGNOSIS — I872 Venous insufficiency (chronic) (peripheral): Secondary | ICD-10-CM | POA: Diagnosis not present

## 2020-12-13 DIAGNOSIS — M109 Gout, unspecified: Secondary | ICD-10-CM | POA: Diagnosis not present

## 2020-12-13 DIAGNOSIS — E1140 Type 2 diabetes mellitus with diabetic neuropathy, unspecified: Secondary | ICD-10-CM | POA: Diagnosis not present

## 2021-04-21 ENCOUNTER — Other Ambulatory Visit: Payer: Self-pay | Admitting: Family Medicine

## 2021-05-16 ENCOUNTER — Other Ambulatory Visit: Payer: Self-pay | Admitting: Cardiovascular Disease

## 2021-05-16 NOTE — Telephone Encounter (Signed)
Rx(s) sent to pharmacy electronically.  

## 2021-06-15 ENCOUNTER — Other Ambulatory Visit: Payer: Self-pay | Admitting: Internal Medicine

## 2021-07-26 ENCOUNTER — Other Ambulatory Visit: Payer: Self-pay | Admitting: Internal Medicine

## 2021-08-05 ENCOUNTER — Other Ambulatory Visit: Payer: Self-pay | Admitting: Internal Medicine

## 2021-08-12 ENCOUNTER — Other Ambulatory Visit: Payer: Self-pay | Admitting: Cardiovascular Disease

## 2021-08-13 ENCOUNTER — Other Ambulatory Visit: Payer: Self-pay | Admitting: Cardiovascular Disease

## 2021-08-25 ENCOUNTER — Other Ambulatory Visit: Payer: Self-pay | Admitting: Cardiovascular Disease

## 2021-09-02 ENCOUNTER — Other Ambulatory Visit: Payer: Self-pay | Admitting: Cardiovascular Disease

## 2024-09-24 DEATH — deceased
